# Patient Record
Sex: Female | Born: 1935 | Race: White | Hispanic: No
Health system: Southern US, Community
[De-identification: ages and names within clinical notes are randomized; demographics above are authoritative.]

## PROBLEM LIST (undated history)

## (undated) DIAGNOSIS — F329 Major depressive disorder, single episode, unspecified: Secondary | ICD-10-CM

## (undated) DIAGNOSIS — M419 Scoliosis, unspecified: Secondary | ICD-10-CM

## (undated) DIAGNOSIS — R339 Retention of urine, unspecified: Secondary | ICD-10-CM

## (undated) DIAGNOSIS — I10 Essential (primary) hypertension: Secondary | ICD-10-CM

## (undated) DIAGNOSIS — I4891 Unspecified atrial fibrillation: Secondary | ICD-10-CM

## (undated) DIAGNOSIS — F32A Depression, unspecified: Secondary | ICD-10-CM

## (undated) DIAGNOSIS — M859 Disorder of bone density and structure, unspecified: Secondary | ICD-10-CM

## (undated) DIAGNOSIS — R7301 Impaired fasting glucose: Secondary | ICD-10-CM

## (undated) DIAGNOSIS — M797 Fibromyalgia: Secondary | ICD-10-CM

## (undated) DIAGNOSIS — E079 Disorder of thyroid, unspecified: Secondary | ICD-10-CM

## (undated) DIAGNOSIS — K5900 Constipation, unspecified: Secondary | ICD-10-CM

## (undated) DIAGNOSIS — E875 Hyperkalemia: Secondary | ICD-10-CM

## (undated) DIAGNOSIS — E785 Hyperlipidemia, unspecified: Secondary | ICD-10-CM

## (undated) DIAGNOSIS — C50919 Malignant neoplasm of unspecified site of unspecified female breast: Secondary | ICD-10-CM

## (undated) DIAGNOSIS — F419 Anxiety disorder, unspecified: Secondary | ICD-10-CM

## (undated) HISTORY — PX: HIP FRACTURE SURGERY: SHX118

---

## 1998-07-05 ENCOUNTER — Encounter: Admission: RE | Admit: 1998-07-05 | Discharge: 1998-08-03 | Payer: Self-pay | Admitting: Orthopedic Surgery

## 1999-02-26 ENCOUNTER — Ambulatory Visit (HOSPITAL_COMMUNITY): Admission: RE | Admit: 1999-02-26 | Discharge: 1999-02-26 | Payer: Self-pay | Admitting: Unknown Physician Specialty

## 1999-08-04 ENCOUNTER — Encounter: Admission: RE | Admit: 1999-08-04 | Discharge: 1999-08-04 | Payer: Self-pay | Admitting: Family Medicine

## 1999-08-04 ENCOUNTER — Encounter: Payer: Self-pay | Admitting: Family Medicine

## 1999-08-21 ENCOUNTER — Encounter: Payer: Self-pay | Admitting: Neurosurgery

## 1999-08-21 ENCOUNTER — Encounter: Admission: RE | Admit: 1999-08-21 | Discharge: 1999-08-21 | Payer: Self-pay | Admitting: Neurosurgery

## 1999-08-24 ENCOUNTER — Ambulatory Visit (HOSPITAL_COMMUNITY): Admission: RE | Admit: 1999-08-24 | Discharge: 1999-08-24 | Payer: Self-pay | Admitting: Neurosurgery

## 1999-08-24 ENCOUNTER — Encounter: Payer: Self-pay | Admitting: Neurosurgery

## 1999-09-01 ENCOUNTER — Ambulatory Visit (HOSPITAL_COMMUNITY): Admission: RE | Admit: 1999-09-01 | Discharge: 1999-09-01 | Payer: Self-pay | Admitting: Gastroenterology

## 1999-09-07 ENCOUNTER — Encounter: Payer: Self-pay | Admitting: Neurosurgery

## 1999-09-07 ENCOUNTER — Ambulatory Visit (HOSPITAL_COMMUNITY): Admission: RE | Admit: 1999-09-07 | Discharge: 1999-09-07 | Payer: Self-pay | Admitting: Neurosurgery

## 1999-09-27 ENCOUNTER — Encounter: Payer: Self-pay | Admitting: Neurosurgery

## 1999-09-27 ENCOUNTER — Ambulatory Visit (HOSPITAL_COMMUNITY): Admission: RE | Admit: 1999-09-27 | Discharge: 1999-09-27 | Payer: Self-pay | Admitting: Neurosurgery

## 1999-10-11 ENCOUNTER — Encounter: Payer: Self-pay | Admitting: Neurosurgery

## 1999-10-11 ENCOUNTER — Ambulatory Visit (HOSPITAL_COMMUNITY): Admission: RE | Admit: 1999-10-11 | Discharge: 1999-10-11 | Payer: Self-pay | Admitting: Neurosurgery

## 1999-11-17 ENCOUNTER — Encounter: Admission: RE | Admit: 1999-11-17 | Discharge: 1999-11-17 | Payer: Self-pay | Admitting: Family Medicine

## 1999-11-17 ENCOUNTER — Encounter: Payer: Self-pay | Admitting: Family Medicine

## 1999-11-17 ENCOUNTER — Ambulatory Visit (HOSPITAL_COMMUNITY): Admission: RE | Admit: 1999-11-17 | Discharge: 1999-11-17 | Payer: Self-pay | Admitting: Orthopedic Surgery

## 1999-11-24 ENCOUNTER — Other Ambulatory Visit: Admission: RE | Admit: 1999-11-24 | Discharge: 1999-11-24 | Payer: Self-pay | Admitting: *Deleted

## 1999-11-24 ENCOUNTER — Encounter (INDEPENDENT_AMBULATORY_CARE_PROVIDER_SITE_OTHER): Payer: Self-pay

## 1999-12-22 ENCOUNTER — Inpatient Hospital Stay (HOSPITAL_COMMUNITY): Admission: RE | Admit: 1999-12-22 | Discharge: 1999-12-26 | Payer: Self-pay | Admitting: Orthopedic Surgery

## 1999-12-26 ENCOUNTER — Inpatient Hospital Stay (HOSPITAL_COMMUNITY)
Admission: RE | Admit: 1999-12-26 | Discharge: 2000-01-05 | Payer: Self-pay | Admitting: Physical Medicine & Rehabilitation

## 2000-01-06 ENCOUNTER — Encounter: Payer: Self-pay | Admitting: Emergency Medicine

## 2000-01-06 ENCOUNTER — Inpatient Hospital Stay (HOSPITAL_COMMUNITY): Admission: EM | Admit: 2000-01-06 | Discharge: 2000-01-11 | Payer: Self-pay | Admitting: Emergency Medicine

## 2000-01-08 ENCOUNTER — Encounter: Payer: Self-pay | Admitting: Internal Medicine

## 2000-02-08 ENCOUNTER — Encounter: Admission: RE | Admit: 2000-02-08 | Discharge: 2000-05-08 | Payer: Self-pay | Admitting: Orthopedic Surgery

## 2000-05-03 ENCOUNTER — Ambulatory Visit (HOSPITAL_COMMUNITY): Admission: RE | Admit: 2000-05-03 | Discharge: 2000-05-03 | Payer: Self-pay | Admitting: Neurosurgery

## 2000-05-03 ENCOUNTER — Encounter: Payer: Self-pay | Admitting: Neurosurgery

## 2000-05-20 ENCOUNTER — Encounter: Payer: Self-pay | Admitting: Family Medicine

## 2000-05-20 ENCOUNTER — Ambulatory Visit (HOSPITAL_COMMUNITY): Admission: RE | Admit: 2000-05-20 | Discharge: 2000-05-20 | Payer: Self-pay | Admitting: Family Medicine

## 2000-06-03 ENCOUNTER — Ambulatory Visit (HOSPITAL_COMMUNITY): Admission: RE | Admit: 2000-06-03 | Discharge: 2000-06-03 | Payer: Self-pay | Admitting: Family Medicine

## 2000-06-03 ENCOUNTER — Encounter: Payer: Self-pay | Admitting: Family Medicine

## 2000-06-17 ENCOUNTER — Emergency Department (HOSPITAL_COMMUNITY): Admission: EM | Admit: 2000-06-17 | Discharge: 2000-06-17 | Payer: Self-pay | Admitting: Emergency Medicine

## 2000-06-17 ENCOUNTER — Encounter: Payer: Self-pay | Admitting: Internal Medicine

## 2000-06-17 ENCOUNTER — Encounter: Payer: Self-pay | Admitting: Emergency Medicine

## 2000-06-17 ENCOUNTER — Encounter (INDEPENDENT_AMBULATORY_CARE_PROVIDER_SITE_OTHER): Payer: Self-pay | Admitting: Specialist

## 2000-06-17 ENCOUNTER — Inpatient Hospital Stay (HOSPITAL_COMMUNITY): Admission: EM | Admit: 2000-06-17 | Discharge: 2000-06-19 | Payer: Self-pay | Admitting: Emergency Medicine

## 2000-08-06 ENCOUNTER — Encounter: Admission: RE | Admit: 2000-08-06 | Discharge: 2000-08-06 | Payer: Self-pay | Admitting: Family Medicine

## 2000-08-06 ENCOUNTER — Encounter: Payer: Self-pay | Admitting: Family Medicine

## 2000-08-14 ENCOUNTER — Encounter: Admission: RE | Admit: 2000-08-14 | Discharge: 2000-09-17 | Payer: Self-pay | Admitting: Family Medicine

## 2000-10-09 ENCOUNTER — Observation Stay (HOSPITAL_COMMUNITY): Admission: EM | Admit: 2000-10-09 | Discharge: 2000-10-09 | Payer: Self-pay | Admitting: Emergency Medicine

## 2000-11-26 ENCOUNTER — Encounter: Admission: RE | Admit: 2000-11-26 | Discharge: 2000-11-26 | Payer: Self-pay | Admitting: Neurosurgery

## 2000-11-26 ENCOUNTER — Encounter: Payer: Self-pay | Admitting: Neurosurgery

## 2000-12-10 ENCOUNTER — Encounter: Payer: Self-pay | Admitting: Neurosurgery

## 2000-12-10 ENCOUNTER — Encounter: Admission: RE | Admit: 2000-12-10 | Discharge: 2000-12-10 | Payer: Self-pay | Admitting: Neurosurgery

## 2000-12-27 ENCOUNTER — Encounter: Admission: RE | Admit: 2000-12-27 | Discharge: 2000-12-27 | Payer: Self-pay | Admitting: Neurosurgery

## 2000-12-27 ENCOUNTER — Encounter: Payer: Self-pay | Admitting: Neurosurgery

## 2001-01-28 ENCOUNTER — Encounter: Payer: Self-pay | Admitting: Orthopedic Surgery

## 2001-01-31 ENCOUNTER — Encounter: Payer: Self-pay | Admitting: Orthopedic Surgery

## 2001-01-31 ENCOUNTER — Inpatient Hospital Stay (HOSPITAL_COMMUNITY): Admission: RE | Admit: 2001-01-31 | Discharge: 2001-02-05 | Payer: Self-pay | Admitting: Orthopedic Surgery

## 2001-02-05 ENCOUNTER — Inpatient Hospital Stay
Admission: RE | Admit: 2001-02-05 | Discharge: 2001-02-11 | Payer: Self-pay | Admitting: Physical Medicine & Rehabilitation

## 2001-03-14 ENCOUNTER — Encounter: Admission: RE | Admit: 2001-03-14 | Discharge: 2001-06-12 | Payer: Self-pay | Admitting: Orthopedic Surgery

## 2001-08-08 ENCOUNTER — Encounter: Admission: RE | Admit: 2001-08-08 | Discharge: 2001-08-08 | Payer: Self-pay | Admitting: Family Medicine

## 2001-08-08 ENCOUNTER — Encounter: Payer: Self-pay | Admitting: Family Medicine

## 2002-02-06 ENCOUNTER — Encounter: Admission: RE | Admit: 2002-02-06 | Discharge: 2002-02-06 | Payer: Self-pay | Admitting: Family Medicine

## 2002-02-06 ENCOUNTER — Encounter: Payer: Self-pay | Admitting: Family Medicine

## 2002-08-13 ENCOUNTER — Encounter: Admission: RE | Admit: 2002-08-13 | Discharge: 2002-08-13 | Payer: Self-pay | Admitting: Family Medicine

## 2002-08-13 ENCOUNTER — Encounter: Payer: Self-pay | Admitting: Family Medicine

## 2002-08-20 ENCOUNTER — Encounter: Payer: Self-pay | Admitting: Urology

## 2002-08-24 ENCOUNTER — Observation Stay (HOSPITAL_COMMUNITY): Admission: RE | Admit: 2002-08-24 | Discharge: 2002-08-25 | Payer: Self-pay | Admitting: Urology

## 2003-01-01 ENCOUNTER — Other Ambulatory Visit: Admission: RE | Admit: 2003-01-01 | Discharge: 2003-01-01 | Payer: Self-pay | Admitting: Obstetrics and Gynecology

## 2003-08-19 ENCOUNTER — Encounter: Admission: RE | Admit: 2003-08-19 | Discharge: 2003-08-19 | Payer: Self-pay | Admitting: Family Medicine

## 2003-08-25 ENCOUNTER — Encounter: Admission: RE | Admit: 2003-08-25 | Discharge: 2003-08-25 | Payer: Self-pay | Admitting: Family Medicine

## 2004-03-10 ENCOUNTER — Encounter: Admission: RE | Admit: 2004-03-10 | Discharge: 2004-03-10 | Payer: Self-pay | Admitting: Family Medicine

## 2004-03-10 ENCOUNTER — Encounter (INDEPENDENT_AMBULATORY_CARE_PROVIDER_SITE_OTHER): Payer: Self-pay | Admitting: Specialist

## 2004-08-22 ENCOUNTER — Encounter: Admission: RE | Admit: 2004-08-22 | Discharge: 2004-08-22 | Payer: Self-pay | Admitting: Family Medicine

## 2005-02-28 ENCOUNTER — Encounter: Admission: RE | Admit: 2005-02-28 | Discharge: 2005-02-28 | Payer: Self-pay | Admitting: Gastroenterology

## 2005-02-28 ENCOUNTER — Encounter (INDEPENDENT_AMBULATORY_CARE_PROVIDER_SITE_OTHER): Payer: Self-pay | Admitting: *Deleted

## 2005-02-28 ENCOUNTER — Observation Stay (HOSPITAL_COMMUNITY): Admission: EM | Admit: 2005-02-28 | Discharge: 2005-03-01 | Payer: Self-pay | Admitting: Emergency Medicine

## 2005-08-23 ENCOUNTER — Encounter: Admission: RE | Admit: 2005-08-23 | Discharge: 2005-08-23 | Payer: Self-pay | Admitting: Family Medicine

## 2006-07-30 ENCOUNTER — Encounter: Admission: RE | Admit: 2006-07-30 | Discharge: 2006-07-30 | Payer: Self-pay | Admitting: Gastroenterology

## 2006-08-12 ENCOUNTER — Encounter: Admission: RE | Admit: 2006-08-12 | Discharge: 2006-08-12 | Payer: Self-pay | Admitting: Gastroenterology

## 2006-08-28 ENCOUNTER — Encounter: Admission: RE | Admit: 2006-08-28 | Discharge: 2006-08-28 | Payer: Self-pay | Admitting: Family Medicine

## 2006-10-18 ENCOUNTER — Ambulatory Visit (HOSPITAL_COMMUNITY): Admission: RE | Admit: 2006-10-18 | Discharge: 2006-10-18 | Payer: Self-pay | Admitting: Cardiology

## 2007-01-17 ENCOUNTER — Encounter (INDEPENDENT_AMBULATORY_CARE_PROVIDER_SITE_OTHER): Payer: Self-pay | Admitting: Surgery

## 2007-01-17 ENCOUNTER — Inpatient Hospital Stay (HOSPITAL_COMMUNITY): Admission: RE | Admit: 2007-01-17 | Discharge: 2007-01-20 | Payer: Self-pay | Admitting: Surgery

## 2007-08-17 ENCOUNTER — Emergency Department (HOSPITAL_COMMUNITY): Admission: EM | Admit: 2007-08-17 | Discharge: 2007-08-17 | Payer: Self-pay | Admitting: Emergency Medicine

## 2007-09-10 ENCOUNTER — Encounter: Admission: RE | Admit: 2007-09-10 | Discharge: 2007-09-10 | Payer: Self-pay | Admitting: Obstetrics and Gynecology

## 2007-09-30 ENCOUNTER — Encounter: Admission: RE | Admit: 2007-09-30 | Discharge: 2007-09-30 | Payer: Self-pay | Admitting: Obstetrics and Gynecology

## 2007-10-10 ENCOUNTER — Encounter: Admission: RE | Admit: 2007-10-10 | Discharge: 2007-10-10 | Payer: Self-pay | Admitting: Obstetrics and Gynecology

## 2007-10-15 ENCOUNTER — Ambulatory Visit (HOSPITAL_BASED_OUTPATIENT_CLINIC_OR_DEPARTMENT_OTHER): Admission: RE | Admit: 2007-10-15 | Discharge: 2007-10-15 | Payer: Self-pay | Admitting: Obstetrics and Gynecology

## 2007-10-27 ENCOUNTER — Encounter: Admission: RE | Admit: 2007-10-27 | Discharge: 2007-10-27 | Payer: Self-pay | Admitting: Surgery

## 2007-10-27 ENCOUNTER — Ambulatory Visit (HOSPITAL_BASED_OUTPATIENT_CLINIC_OR_DEPARTMENT_OTHER): Admission: RE | Admit: 2007-10-27 | Discharge: 2007-10-27 | Payer: Self-pay | Admitting: Surgery

## 2007-11-05 ENCOUNTER — Ambulatory Visit: Payer: Self-pay | Admitting: Oncology

## 2007-11-12 ENCOUNTER — Ambulatory Visit: Admission: RE | Admit: 2007-11-12 | Discharge: 2008-01-18 | Payer: Self-pay | Admitting: Radiation Oncology

## 2007-12-09 ENCOUNTER — Ambulatory Visit: Payer: Self-pay | Admitting: Vascular Surgery

## 2007-12-09 ENCOUNTER — Encounter (INDEPENDENT_AMBULATORY_CARE_PROVIDER_SITE_OTHER): Payer: Self-pay | Admitting: Cardiology

## 2007-12-09 ENCOUNTER — Ambulatory Visit (HOSPITAL_COMMUNITY): Admission: RE | Admit: 2007-12-09 | Discharge: 2007-12-09 | Payer: Self-pay | Admitting: Cardiology

## 2007-12-12 LAB — CBC WITH DIFFERENTIAL/PLATELET
Basophils Absolute: 0 10*3/uL (ref 0.0–0.1)
EOS%: 4.2 % (ref 0.0–7.0)
Eosinophils Absolute: 0.3 10*3/uL (ref 0.0–0.5)
HGB: 13.6 g/dL (ref 11.6–15.9)
LYMPH%: 25.6 % (ref 14.0–48.0)
MCH: 29.4 pg (ref 26.0–34.0)
MCV: 87.5 fL (ref 81.0–101.0)
MONO%: 11.4 % (ref 0.0–13.0)
NEUT#: 3.9 10*3/uL (ref 1.5–6.5)
NEUT%: 58.5 % (ref 39.6–76.8)
Platelets: 215 10*3/uL (ref 145–400)
RDW: 14.2 % (ref 11.3–14.5)

## 2008-01-09 ENCOUNTER — Ambulatory Visit (HOSPITAL_COMMUNITY): Admission: RE | Admit: 2008-01-09 | Discharge: 2008-01-09 | Payer: Self-pay | Admitting: Cardiology

## 2008-01-15 ENCOUNTER — Ambulatory Visit: Payer: Self-pay | Admitting: Oncology

## 2008-01-22 ENCOUNTER — Ambulatory Visit: Admission: RE | Admit: 2008-01-22 | Discharge: 2008-01-22 | Payer: Self-pay | Admitting: Radiation Oncology

## 2008-03-18 ENCOUNTER — Ambulatory Visit: Payer: Self-pay | Admitting: Oncology

## 2008-05-25 ENCOUNTER — Ambulatory Visit: Payer: Self-pay | Admitting: Oncology

## 2008-07-06 ENCOUNTER — Encounter: Admission: RE | Admit: 2008-07-06 | Discharge: 2008-07-06 | Payer: Self-pay | Admitting: Orthopedic Surgery

## 2008-09-13 ENCOUNTER — Encounter: Admission: RE | Admit: 2008-09-13 | Discharge: 2008-09-13 | Payer: Self-pay | Admitting: Family Medicine

## 2008-11-23 ENCOUNTER — Ambulatory Visit: Payer: Self-pay | Admitting: Oncology

## 2009-01-04 ENCOUNTER — Inpatient Hospital Stay (HOSPITAL_COMMUNITY): Admission: AD | Admit: 2009-01-04 | Discharge: 2009-01-07 | Payer: Self-pay | Admitting: Cardiology

## 2009-04-30 ENCOUNTER — Emergency Department (HOSPITAL_COMMUNITY): Admission: EM | Admit: 2009-04-30 | Discharge: 2009-04-30 | Payer: Self-pay | Admitting: Emergency Medicine

## 2009-05-24 ENCOUNTER — Ambulatory Visit: Payer: Self-pay | Admitting: Oncology

## 2009-05-27 ENCOUNTER — Ambulatory Visit (HOSPITAL_COMMUNITY): Admission: RE | Admit: 2009-05-27 | Discharge: 2009-05-27 | Payer: Self-pay | Admitting: Cardiology

## 2009-08-29 ENCOUNTER — Encounter: Admission: RE | Admit: 2009-08-29 | Discharge: 2009-08-29 | Payer: Self-pay | Admitting: Orthopedic Surgery

## 2009-09-16 ENCOUNTER — Encounter: Admission: RE | Admit: 2009-09-16 | Discharge: 2009-09-16 | Payer: Self-pay | Admitting: Obstetrics and Gynecology

## 2009-10-07 ENCOUNTER — Encounter: Admission: RE | Admit: 2009-10-07 | Discharge: 2009-10-07 | Payer: Self-pay | Admitting: Family Medicine

## 2009-10-07 ENCOUNTER — Ambulatory Visit: Payer: Self-pay | Admitting: Vascular Surgery

## 2009-10-18 ENCOUNTER — Ambulatory Visit: Payer: Self-pay | Admitting: Oncology

## 2009-10-21 ENCOUNTER — Encounter: Payer: Self-pay | Admitting: Oncology

## 2009-10-21 ENCOUNTER — Ambulatory Visit: Admission: RE | Admit: 2009-10-21 | Discharge: 2009-10-21 | Payer: Self-pay | Admitting: Oncology

## 2009-10-21 ENCOUNTER — Ambulatory Visit: Payer: Self-pay | Admitting: Vascular Surgery

## 2009-11-05 ENCOUNTER — Emergency Department (HOSPITAL_COMMUNITY): Admission: EM | Admit: 2009-11-05 | Discharge: 2009-11-05 | Payer: Self-pay | Admitting: Emergency Medicine

## 2009-11-08 ENCOUNTER — Inpatient Hospital Stay (HOSPITAL_COMMUNITY): Admission: EM | Admit: 2009-11-08 | Discharge: 2009-11-15 | Payer: Self-pay | Admitting: Emergency Medicine

## 2009-11-12 ENCOUNTER — Encounter (INDEPENDENT_AMBULATORY_CARE_PROVIDER_SITE_OTHER): Payer: Self-pay | Admitting: Internal Medicine

## 2009-11-12 ENCOUNTER — Ambulatory Visit: Payer: Self-pay | Admitting: Vascular Surgery

## 2009-11-15 ENCOUNTER — Ambulatory Visit: Payer: Self-pay | Admitting: Psychiatry

## 2009-11-18 ENCOUNTER — Ambulatory Visit: Payer: Self-pay | Admitting: Oncology

## 2010-01-11 ENCOUNTER — Inpatient Hospital Stay (HOSPITAL_COMMUNITY): Admission: EM | Admit: 2010-01-11 | Discharge: 2010-01-13 | Payer: Self-pay | Admitting: Emergency Medicine

## 2010-01-20 ENCOUNTER — Encounter (HOSPITAL_BASED_OUTPATIENT_CLINIC_OR_DEPARTMENT_OTHER)
Admission: RE | Admit: 2010-01-20 | Discharge: 2010-04-14 | Payer: Self-pay | Source: Home / Self Care | Admitting: Internal Medicine

## 2010-03-17 ENCOUNTER — Ambulatory Visit (HOSPITAL_COMMUNITY)
Admission: RE | Admit: 2010-03-17 | Discharge: 2010-03-17 | Payer: Self-pay | Source: Home / Self Care | Admitting: Orthopedic Surgery

## 2010-07-27 LAB — BASIC METABOLIC PANEL
CO2: 29 mEq/L (ref 19–32)
Calcium: 8.2 mg/dL — ABNORMAL LOW (ref 8.4–10.5)
Chloride: 105 mEq/L (ref 96–112)
Chloride: 91 mEq/L — ABNORMAL LOW (ref 96–112)
Creatinine, Ser: 1 mg/dL (ref 0.4–1.2)
GFR calc Af Amer: >60 mL/min (ref >60)
GFR calc Af Amer: >60 mL/min (ref >60)
GFR calc non Af Amer: >60 mL/min (ref >60)
GFR calc non Af Amer: 54 mL/min — ABNORMAL LOW (ref >60)
Potassium: 4.2 mEq/L (ref 3.5–5.1)
Potassium: 4.5 mEq/L (ref 3.5–5.1)
Sodium: 139 mEq/L (ref 135–145)
Sodium: 140 mEq/L (ref 135–145)

## 2010-07-27 LAB — CBC
HCT: 32 % — ABNORMAL LOW (ref 36.0–46.0)
HCT: 39.2 % (ref 36.0–46.0)
Hemoglobin: 10.4 g/dL — ABNORMAL LOW (ref 12.0–15.0)
Hemoglobin: 10.7 g/dL — ABNORMAL LOW (ref 12.0–15.0)
Hemoglobin: 12.3 g/dL (ref 12.0–15.0)
MCH: 28.6 pg (ref 26.0–34.0)
MCH: 28.4 pg (ref 26.0–34.0)
MCV: 84.6 fL (ref 78.0–100.0)
RBC: 3.66 MIL/uL — ABNORMAL LOW (ref 3.87–5.11)
RBC: 3.75 MIL/uL — ABNORMAL LOW (ref 3.87–5.11)
RBC: 4.33 MIL/uL (ref 3.87–5.11)
RBC: 4.63 MIL/uL (ref 3.87–5.11)
RDW: 15.3 % (ref 11.5–15.5)
RDW: 15.1 % (ref 11.5–15.5)
WBC: 7 10*3/uL (ref 4.0–10.5)
WBC: 11.2 10*3/uL — ABNORMAL HIGH (ref 4.0–10.5)

## 2010-07-27 LAB — POCT I-STAT, CHEM 8
BUN: 44 mg/dL — ABNORMAL HIGH (ref 6–23)
Calcium, Ion: 0.99 mmol/L — ABNORMAL LOW (ref 1.12–1.32)
Chloride: 86 meq/L — ABNORMAL LOW (ref 96–112)
Creatinine, Ser: 1.3 mg/dL — ABNORMAL HIGH (ref 0.4–1.2)
Glucose, Bld: 119 mg/dL — ABNORMAL HIGH (ref 70–99)
HCT: 44 % (ref 36.0–46.0)
Hemoglobin: 15 g/dL (ref 12.0–15.0)
Potassium: 2 meq/L — CL (ref 3.5–5.1)
Sodium: 132 meq/L — ABNORMAL LOW (ref 135–145)
TCO2: 42 mmol/L (ref 0–100)

## 2010-07-27 LAB — IRON AND TIBC
Iron: 37 ug/dL — ABNORMAL LOW (ref 42–135)
Saturation Ratios: 16 % — ABNORMAL LOW (ref 20–55)
TIBC: 238 ug/dL — ABNORMAL LOW (ref 250–470)
UIBC: 201 ug/dL

## 2010-07-27 LAB — POCT CARDIAC MARKERS
CKMB, poc: <1 ng/mL — ABNORMAL LOW (ref 1.0–8.0)
Myoglobin, poc: 73.9 ng/mL (ref 12–200)
Troponin i, poc: <0.05 ng/mL (ref 0.00–0.09)

## 2010-07-27 LAB — HEPATIC FUNCTION PANEL
ALT: 21 U/L (ref 0–35)
AST: 19 U/L (ref 0–37)
Albumin: 3.7 g/dL (ref 3.5–5.2)
Alkaline Phosphatase: 55 U/L (ref 39–117)
Bilirubin, Direct: <0.1 mg/dL (ref 0.0–0.3)
Total Bilirubin: 0.3 mg/dL (ref 0.3–1.2)
Total Protein: 6.9 g/dL (ref 6.0–8.3)

## 2010-07-27 LAB — MRSA PCR SCREENING: MRSA by PCR: NEGATIVE

## 2010-07-27 LAB — DIFFERENTIAL
Eosinophils Relative: 1 % (ref 0–5)
Lymphocytes Relative: 23 % (ref 12–46)
Lymphs Abs: 2.6 10*3/uL (ref 0.7–4.0)
Monocytes Absolute: 1.1 10*3/uL — ABNORMAL HIGH (ref 0.1–1.0)

## 2010-07-27 LAB — MAGNESIUM: Magnesium: 2.1 mg/dL (ref 1.5–2.5)

## 2010-07-27 LAB — VITAMIN B12: Vitamin B-12: 507 pg/mL (ref 211–911)

## 2010-07-27 LAB — RETICULOCYTES: RBC.: 4.23 MIL/uL (ref 3.87–5.11)

## 2010-07-27 LAB — URINALYSIS, ROUTINE W REFLEX MICROSCOPIC
Nitrite: NEGATIVE
Specific Gravity, Urine: 1.012 (ref 1.005–1.030)
pH: 5.5 (ref 5.0–8.0)

## 2010-07-27 LAB — TSH: TSH: 1.085 u[IU]/mL (ref 0.350–4.500)

## 2010-07-29 LAB — APTT: aPTT: 34 seconds (ref 24–37)

## 2010-07-29 LAB — MAGNESIUM: Magnesium: 2.4 mg/dL (ref 1.5–2.5)

## 2010-07-30 LAB — APTT
aPTT: 45 seconds — ABNORMAL HIGH (ref 24–37)
aPTT: 50 seconds — ABNORMAL HIGH (ref 24–37)
aPTT: 31 seconds (ref 24–37)
aPTT: 39 seconds — ABNORMAL HIGH (ref 24–37)
aPTT: 41 seconds — ABNORMAL HIGH (ref 24–37)
aPTT: 47 seconds — ABNORMAL HIGH (ref 24–37)
aPTT: 62 seconds — ABNORMAL HIGH (ref 24–37)

## 2010-07-30 LAB — PROTIME-INR
INR: 1.21 (ref 0.00–1.49)
INR: 1.83 — ABNORMAL HIGH (ref 0.00–1.49)
INR: 1.85 — ABNORMAL HIGH (ref 0.00–1.49)
INR: 2.23 — ABNORMAL HIGH (ref 0.00–1.49)
INR: 1.58 — ABNORMAL HIGH (ref 0.00–1.49)
INR: 2.49 — ABNORMAL HIGH (ref 0.00–1.49)
INR: 3.56 — ABNORMAL HIGH (ref 0.00–1.49)
INR: 3.58 — ABNORMAL HIGH (ref 0.00–1.49)
Prothrombin Time: 19.1 seconds — ABNORMAL HIGH (ref 11.6–15.2)
Prothrombin Time: 21 seconds — ABNORMAL HIGH (ref 11.6–15.2)
Prothrombin Time: 26.7 seconds — ABNORMAL HIGH (ref 11.6–15.2)
Prothrombin Time: 35.3 seconds — ABNORMAL HIGH (ref 11.6–15.2)

## 2010-07-30 LAB — BASIC METABOLIC PANEL
BUN: 17 mg/dL (ref 6–23)
BUN: 23 mg/dL (ref 6–23)
CO2: 32 mEq/L (ref 19–32)
Calcium: 8.2 mg/dL — ABNORMAL LOW (ref 8.4–10.5)
Calcium: 8.2 mg/dL — ABNORMAL LOW (ref 8.4–10.5)
Calcium: 8.5 mg/dL (ref 8.4–10.5)
Chloride: 94 mEq/L — ABNORMAL LOW (ref 96–112)
Creatinine, Ser: 0.53 mg/dL (ref 0.4–1.2)
Creatinine, Ser: 0.69 mg/dL (ref 0.4–1.2)
Creatinine, Ser: 0.73 mg/dL (ref 0.4–1.2)
GFR calc Af Amer: >60 mL/min (ref >60)
GFR calc Af Amer: >60 mL/min (ref >60)
GFR calc non Af Amer: >60 mL/min (ref >60)
GFR calc non Af Amer: >60 mL/min (ref >60)
GFR calc non Af Amer: >60 mL/min (ref >60)
GFR calc non Af Amer: >60 mL/min (ref >60)
Glucose, Bld: 108 mg/dL — ABNORMAL HIGH (ref 70–99)
Glucose, Bld: 128 mg/dL — ABNORMAL HIGH (ref 70–99)
Potassium: 4.7 mEq/L (ref 3.5–5.1)
Potassium: 5.4 mEq/L — ABNORMAL HIGH (ref 3.5–5.1)
Potassium: 4.3 mEq/L (ref 3.5–5.1)
Potassium: 4.7 mEq/L (ref 3.5–5.1)
Sodium: 135 mEq/L (ref 135–145)
Sodium: 138 mEq/L (ref 135–145)
Sodium: 139 mEq/L (ref 135–145)

## 2010-07-30 LAB — GLUCOSE, CAPILLARY
Glucose-Capillary: 104 mg/dL — ABNORMAL HIGH (ref 70–99)
Glucose-Capillary: 119 mg/dL — ABNORMAL HIGH (ref 70–99)
Glucose-Capillary: 121 mg/dL — ABNORMAL HIGH (ref 70–99)
Glucose-Capillary: 121 mg/dL — ABNORMAL HIGH (ref 70–99)
Glucose-Capillary: 124 mg/dL — ABNORMAL HIGH (ref 70–99)
Glucose-Capillary: 128 mg/dL — ABNORMAL HIGH (ref 70–99)
Glucose-Capillary: 144 mg/dL — ABNORMAL HIGH (ref 70–99)
Glucose-Capillary: 164 mg/dL — ABNORMAL HIGH (ref 70–99)
Glucose-Capillary: 170 mg/dL — ABNORMAL HIGH (ref 70–99)

## 2010-07-30 LAB — COMPREHENSIVE METABOLIC PANEL
ALT: 19 U/L (ref 0–35)
AST: 28 U/L (ref 0–37)
AST: 20 U/L (ref 0–37)
Albumin: 2.4 g/dL — ABNORMAL LOW (ref 3.5–5.2)
Albumin: 3.3 g/dL — ABNORMAL LOW (ref 3.5–5.2)
Alkaline Phosphatase: 57 U/L (ref 39–117)
Chloride: 100 mEq/L (ref 96–112)
Chloride: 101 mEq/L (ref 96–112)
Creatinine, Ser: 0.65 mg/dL (ref 0.4–1.2)
GFR calc Af Amer: >60 mL/min (ref >60)
GFR calc Af Amer: >60 mL/min (ref >60)
Potassium: 4.9 mEq/L (ref 3.5–5.1)
Sodium: 135 mEq/L (ref 135–145)
Sodium: 139 mEq/L (ref 135–145)
Total Bilirubin: 0.9 mg/dL (ref 0.3–1.2)
Total Bilirubin: 0.7 mg/dL (ref 0.3–1.2)

## 2010-07-30 LAB — CBC
HCT: 33.9 % — ABNORMAL LOW (ref 36.0–46.0)
HCT: 40.9 % (ref 36.0–46.0)
Hemoglobin: 13.7 g/dL (ref 12.0–15.0)
Hemoglobin: 13.9 g/dL (ref 12.0–15.0)
MCH: 30 pg (ref 26.0–34.0)
MCH: 30 pg (ref 26.0–34.0)
MCHC: 33.8 g/dL (ref 30.0–36.0)
MCV: 88.4 fL (ref 78.0–100.0)
Platelets: 200 10*3/uL (ref 150–400)
Platelets: 283 10*3/uL (ref 150–400)
Platelets: 228 10*3/uL (ref 150–400)
Platelets: 243 10*3/uL (ref 150–400)
Platelets: 289 10*3/uL (ref 150–400)
RBC: 4.26 MIL/uL (ref 3.87–5.11)
RBC: 4.55 MIL/uL (ref 3.87–5.11)
RBC: 4.61 MIL/uL (ref 3.87–5.11)
RBC: 4.63 MIL/uL (ref 3.87–5.11)
RDW: 13 % (ref 11.5–15.5)
RDW: 13.8 % (ref 11.5–15.5)
WBC: 10.6 10*3/uL — ABNORMAL HIGH (ref 4.0–10.5)
WBC: 12 10*3/uL — ABNORMAL HIGH (ref 4.0–10.5)
WBC: 16 10*3/uL — ABNORMAL HIGH (ref 4.0–10.5)
WBC: 10.7 10*3/uL — ABNORMAL HIGH (ref 4.0–10.5)
WBC: 14.2 10*3/uL — ABNORMAL HIGH (ref 4.0–10.5)
WBC: 9.5 10*3/uL (ref 4.0–10.5)

## 2010-07-30 LAB — DIFFERENTIAL
Eosinophils Absolute: 0 10*3/uL (ref 0.0–0.7)
Lymphocytes Relative: 6 % — ABNORMAL LOW (ref 12–46)
Lymphs Abs: 0.6 10*3/uL — ABNORMAL LOW (ref 0.7–4.0)
Neutro Abs: 8.7 10*3/uL — ABNORMAL HIGH (ref 1.7–7.7)
Neutrophils Relative %: 92 % — ABNORMAL HIGH (ref 43–77)

## 2010-07-30 LAB — CROSSMATCH

## 2010-07-30 LAB — HEMOGLOBIN A1C
Hgb A1c MFr Bld: 6.1 % — ABNORMAL HIGH (ref <5.7)
Mean Plasma Glucose: 128 mg/dL — ABNORMAL HIGH (ref <117)

## 2010-07-30 LAB — ABO/RH: ABO/RH(D): O POS

## 2010-07-30 LAB — TSH: TSH: 0.562 u[IU]/mL (ref 0.350–4.500)

## 2010-08-14 LAB — DIFFERENTIAL
Basophils Absolute: 0 10*3/uL (ref 0.0–0.1)
Basophils Relative: 0 % (ref 0–1)
Lymphocytes Relative: 16 % (ref 12–46)
Monocytes Absolute: 0.6 10*3/uL (ref 0.1–1.0)
Monocytes Relative: 9 % (ref 3–12)
Neutro Abs: 4.8 10*3/uL (ref 1.7–7.7)
Neutrophils Relative %: 72 % (ref 43–77)

## 2010-08-14 LAB — URINALYSIS, ROUTINE W REFLEX MICROSCOPIC
Bilirubin Urine: NEGATIVE
Ketones, ur: NEGATIVE mg/dL
Nitrite: NEGATIVE
Specific Gravity, Urine: 1.016 (ref 1.005–1.030)
Urobilinogen, UA: 0.2 mg/dL (ref 0.0–1.0)

## 2010-08-14 LAB — BASIC METABOLIC PANEL
CO2: 31 mEq/L (ref 19–32)
Chloride: 104 mEq/L (ref 96–112)
Creatinine, Ser: 0.71 mg/dL (ref 0.4–1.2)
GFR calc Af Amer: >60 mL/min (ref >60)
Potassium: 4 mEq/L (ref 3.5–5.1)
Sodium: 142 mEq/L (ref 135–145)

## 2010-08-14 LAB — CBC
HCT: 36 % (ref 36.0–46.0)
Hemoglobin: 12.2 g/dL (ref 12.0–15.0)
MCHC: 33.8 g/dL (ref 30.0–36.0)
MCV: 88.1 fL (ref 78.0–100.0)
RBC: 4.08 MIL/uL (ref 3.87–5.11)
WBC: 6.7 10*3/uL (ref 4.0–10.5)

## 2010-08-14 LAB — PROTIME-INR: INR: 2.69 — ABNORMAL HIGH (ref 0.00–1.49)

## 2010-08-14 LAB — CK TOTAL AND CKMB (NOT AT ARMC): Relative Index: INVALID (ref 0.0–2.5)

## 2010-08-14 LAB — TROPONIN I: Troponin I: 0.01 ng/mL (ref 0.00–0.06)

## 2010-08-19 LAB — COMPREHENSIVE METABOLIC PANEL
ALT: 19 U/L (ref 0–35)
Alkaline Phosphatase: 61 U/L (ref 39–117)
CO2: 31 mEq/L (ref 19–32)
Chloride: 99 mEq/L (ref 96–112)
GFR calc non Af Amer: >60 mL/min (ref >60)
Glucose, Bld: 173 mg/dL — ABNORMAL HIGH (ref 70–99)
Potassium: 4 mEq/L (ref 3.5–5.1)
Sodium: 139 mEq/L (ref 135–145)
Total Bilirubin: 0.6 mg/dL (ref 0.3–1.2)

## 2010-08-19 LAB — PROTIME-INR
INR: 1.9 — ABNORMAL HIGH (ref 0.00–1.49)
INR: 2.5 — ABNORMAL HIGH (ref 0.00–1.49)
Prothrombin Time: 21.8 seconds — ABNORMAL HIGH (ref 11.6–15.2)
Prothrombin Time: 26.8 seconds — ABNORMAL HIGH (ref 11.6–15.2)

## 2010-08-19 LAB — CBC
HCT: 39.7 % (ref 36.0–46.0)
Hemoglobin: 13.3 g/dL (ref 12.0–15.0)
RBC: 4.52 MIL/uL (ref 3.87–5.11)

## 2010-09-15 ENCOUNTER — Other Ambulatory Visit: Payer: Self-pay | Admitting: Family Medicine

## 2010-09-15 DIAGNOSIS — C50919 Malignant neoplasm of unspecified site of unspecified female breast: Secondary | ICD-10-CM

## 2010-09-20 ENCOUNTER — Other Ambulatory Visit: Payer: Self-pay | Admitting: Obstetrics & Gynecology

## 2010-09-20 DIAGNOSIS — Z780 Asymptomatic menopausal state: Secondary | ICD-10-CM

## 2010-09-26 NOTE — Op Note (Signed)
NAME:  [name], [name] NO.:  [id number]   MEDICAL RECORD NO.:  [id number]          PATIENT TYPE:  INP   LOCATION:  X001                         FACILITY:  [facility]   PHYSICIAN:  Wilmon Arms. Corliss Skains, M.D. DATE OF BIRTH:  [birth year]   DATE OF PROCEDURE:  01/17/2007  DATE OF DISCHARGE:                               OPERATIVE REPORT   PREOPERATIVE DIAGNOSIS:  Large hiatal hernia.   POSTOPERATIVE DIAGNOSIS:  Large hiatal hernia.   PROCEDURE PERFORMED:  Laparoscopic hiatal hernia repair and Nissen  fundoplication.   SURGEON:  Wilmon Arms. Corliss Skains, M.D.,FACS   ASSISTANT:  Ardeth Sportsman, M.D.   ANESTHESIA:  General endotracheal.   INDICATIONS:  The patient is a 77 year old female who presented after  workup by Dr. Carman Ching.  She had evaluation for left upper quadrant  abdominal pain and reflux.  A CT scan showed a very large hiatal hernia  with most for stomach up in her chest as well as part of her transverse  colon.  This was confirmed with upper GI.  The patient had multiple  episodes of free reflux.  After cardiac clearance, she presents now for  hiatal hernia repair.   DESCRIPTION OF PROCEDURE:  The patient was brought to the operating room  and placed in the supine position on the operating room table.  After an  adequate level of general anesthesia was obtained, a Foley catheter was  placed under sterile technique.  The patient's legs were placed in  yellow fin stirrups in lithotomy position.  Her arms were tucked at her  sides.  The patient's abdomen was then prepped with Betadine and draped  in sterile fashion.  A timeout was taken to assure the proper patient  and proper procedure.  The area for the initial cannula was selected  approximately 4 cm above the umbilicus to the left of midline.  This  area was infiltrated with 0.25% Marcaine.  A transverse incision was  made.  The 10-mm Optiview port was slowly advanced into the peritoneal  cavity.   Pneumoperitoneum was obtained by insufflating CO2 and  maintaining a maximum pressure of 15 mmHg.  The patient had some  bradycardia, and the insufflation was released.  We then waited for  anesthesia to treat her bradycardia.  When she had recovered a normal  rate and rhythm, we reinsufflated with a maximum pressure at 12 mmHg.  The patient was tilted into reverse Trendelenburg, rotated slightly to  her left.  A 5-mm port was placed in the subxiphoid position.  The  trocar was then removed, and a Nathanson retractor was inserted through  the tunnel.  This was used to retract the liver superiorly to expose the  hiatus.  This was attached to the bed with the fixed retractor.  We were  able to visualize the hiatus.  A 5-mm port was placed in the right  anterior axillary line.  Two 5-mm ports were placed on either side of  the camera port as working ports.  We then reduced the stomach out of  the chest.  We opened the gastrohepatic ligament with  the harmonic  scalpel.  We exposed the a right crus of the diaphragm.  We continued  anteriorly, dissecting the hernia sac off of the anterior hiatus.  We  then continued down the left side of the crus.  We were able to expose  the junction of the left and right crura posteriorly.  Blunt dissection  was used to open the retroesophageal window from left to right.  We then  divided the short gastric vessels with the harmonic scalpel.  We opened  up the retroesophageal window wider with blunt dissection.  The stomach  was pulled around through the retroesophageal window to create our wrap.  This was done with minimal tension.  We had adequately mobilized the  stomach out of the chest, and there were no further attachments pulling  it up.  We were clearly able to identify the anterior vagus nerve.  This  was preserved.  We dissected some of the hernia sac off of the stomach  and esophagus anteriorly.  These pieces were removed and sent for  pathologic  examination.  At this point, we began repairing our crural  opening.  Three sutures of 0 Surgidac were used with pledgets to close  the hiatus posteriorly.  The sutures were tied down with the Ti-Knot  device.  The anesthesiologist was then called in to pass the bougie.  He  passed a 56-French bougie which was lighted.  We watched this enter  through the gastroesophageal junction into the stomach.  No sign of  perforation was noted.  We then created our fundoplication around the 56-  French bougie.  We used a total of 2 sutures of 0 Surgidac to create the  wrap.  We measured the wrap, and it measured 2 cm in length.  There was  plenty of intra-abdominal esophagus that could be seen.  The wrap did  not appear to be under any tension.  We placed 2 interrupted 0 Surgidac  sutures to tack the shoulders of the wrap up to the diaphragm.  We then  inspect for hemostasis.  The Nathanson retractor was removed without any  complications.  The trocars were removed as pneumoperitoneum was  released.  A 4-0 Monocryl was used to place deep sutures in all of the  port sites.  This skin incisions were all closed with Dermabond.   The patient was then extubated and brought to recovery in stable  condition.  All sponge, instrument, and needle counts were correct.      Wilmon Arms. Tsuei, M.D.  Electronically Signed     MKT/MEDQ  D:  01/17/2007  T:  01/17/2007  Job:  [id number]   cc:   Fayrene Fearing L. Malon Kindle., M.D.  Fax: [phone]   Armanda Magic, M.D.  Fax: [phone]   Donia Guiles, M.D.  Fax: [phone]

## 2010-09-26 NOTE — Op Note (Signed)
NAME:  [name], [name]              ACCOUNT NO.:  [id number]   MEDICAL RECORD NO.:  [id number]          PATIENT TYPE:  AMB   LOCATION:  DSC                          FACILITY:  [facility]   PHYSICIAN:  Sherry A. Dickstein, M.D.DATE OF BIRTH:  1931-04-09   DATE OF PROCEDURE:  DATE OF DISCHARGE:                               OPERATIVE REPORT   PREOPERATIVE DIAGNOSIS:  Postmenopausal bleeding.   POSTOPERATIVE DIAGNOSIS:  Postmenopausal bleeding.   PROCEDURE:  D and C, hysteroscopy.   SURGEON:  Sherry A. Rosalio Macadamia, M.D.   ANESTHESIA:  General.   INDICATIONS:  This is a 77 year old G3, P2, 0-1-2 woman who is  postmenopausal, had no bleeding for many years and just recently, over  the past couple of months, has been having continuous spotting.  The  patient was evaluated in the office with a normal ultrasound.  She had  an endometrial biopsy in the office which showed benign tissue.  However, there was some tubal epithelium present, which could possibly  have been part of a polyp.  The patient continued to bleed even after  the endometrial biopsy and therefore, the patient is brought to the  operating room for D and C, hysteroscopy to rule out the presence of  a  polyp.  The patient has also recently been diagnosed with breast cancer  and therefore this diagnosis must be made.   FINDINGS:  Normal-sized, anteflexed uterus.  No adnexal mass.  No  distinct polyp seen.  Some friable, polypoid tissue in the endometrial  cavity, but not a true polyp.  Blood was present in the os at time of  surgery.   PROCEDURE IN DETAIL:  The patient is brought into the operating room,  given adequate general anesthesia.  She was placed in dorsal lithotomy  position.  Her perineum and then her vagina were washed with Betadine.  Pelvic examination was performed.  Surgeon's gown and gloves were  changed.  The patient was draped in a sterile fashion.  A speculum was  placed into the vagina.  Paracervical block  was administered with 1%  Nesacaine.  The anterior lip of the cervix was grasped with a single-  toothed tenaculum.  Cervix was sounded.  Cervix was dilated with Pratt  dilators to a #23.  A diagnostic hysteroscope was introduced into the  endometrial cavity.  There was no true polyp present.  There was some  flimsy material in the endometrial cavity that was a little bit  polypoid, but could also have been present from just irritation from the  hysteroscope.  Pictures were obtained.  The hysteroscope was removed.  A  sharp curettage was performed circumferentially.  The hysteroscope was  reintroduced.  The tissue had been removed with curettage.  There was no  other abnormal tissue seen.  Adequate hemostasis was present.  All  instruments were removed from the vagina.  The patient is taken out of  dorsal lithotomy position.  She was awakened.  She was  moved from the operating table to a stretcher in stable condition.  Complications were none.  Estimated blood loss was less than 5  mL.   PATHOLOGY:  Endometrial curettings to pathology.      Sherry A. Rosalio Macadamia, M.D.  Electronically Signed     SAD/MEDQ  D:  10/15/2007  T:  10/15/2007  Job:  [id number]

## 2010-09-26 NOTE — Discharge Summary (Signed)
NAME:  [name], [name] NO.:  [id number]   MEDICAL RECORD NO.:  [id number]          PATIENT TYPE:  INP   LOCATION:  3703                         FACILITY:  [facility]   PHYSICIAN:  Jake Bathe, MD      DATE OF BIRTH:  1930/08/30   DATE OF ADMISSION:  01/04/2009  DATE OF DISCHARGE:  01/07/2009                               DISCHARGE SUMMARY   CARDIOLOGIST:  Armanda Magic, MD   PRIMARY CARE PHYSICIAN:  Donia Guiles, MD   FINAL DIAGNOSES:  1. Atrial fibrillation - paroxysmal status post cardioversion,      cardioversion was August 2009, admission for amiodarone load.  2. Hypertension.  3. Hypothyroidism.  4. History of breast cancer, status post external radiation therapy.  5. Fibromyalgia.  6. Depression.  7. Lymphocytic colitis.  8. Osteoarthritis.  9. Lymphedema.  10.Prior hematuria with negative workup.   BRIEF HOSPITAL COURSE:  A 77 year old female with a history of  paroxysmal atrial fibrillation, status post cardioversion in the recent  past who once again is in atrial fibrillation, symptomatic.  She felt  tired and short of breath and she was admitted for amiodarone loading.  She tolerated her amiodarone load well.  She remained in atrial  fibrillation throughout her hospitalization.  ECG was unremarkable and  on discharge demonstrated poor R-wave progression, atrial fibrillation,  rate of 79.  On morning of discharge, she is feeling well without any  complaints.   VITAL SIGNS:  Temperature 98.5, pulse 75, respirations 18, blood  pressure 125/81-146/77, and satting 95% on room air.  General:  Alert and oriented x3, in no acute distress, eager to go home.  CARDIOVASCULAR:  Irregularly irregular with no murmurs, rubs, or  gallops.  LUNGS:  Clear to auscultation bilaterally.  ABDOMEN:  Soft and nontender.  Normoactive bowel sounds.  No rebound or  guarding.  EXTREMITIES:  No clubbing, cyanosis, or edema.   LABORATORY WORK:  INR on day of discharge was  2.5.  Liver function  studies demonstrated an AST of 24, ALT of 19, normal.  CBC was normal  with a hemoglobin of 13.3.  TSH was normal with a value of 0.83.  Chest  x-ray on January 05, 2009, showed no active disease.  Hiatal hernia was  noted.   DISCHARGE MEDICATIONS:  1. Amiodarone taper 400 mg twice a day for 1 week, then 400 mg daily      for 1 week, then 200 mg daily thereafter.  2. Tamoxifen 20 mg a day.  3. Percocet 5/225 one tablet every 8 hours as needed.  4. Multivitamin.  5. Calcium 2 tablets a day.  6. OxyContin 20 mg every 8 hours.  7. Levothyroxine 50 mcg 1-1/2 tablets daily.  8. Lotensin 20 mg a day.  9. Frusemide 40 mg 1-1/2 tablets daily.  10.Effexor 75 mg 1 tablet a day.  11.Coumadin 2.5 mg once a day (she had been given 5 mg and now she is      therapeutic.  She will resume her dose of 2.5 mg.  This is new      start amiodarone.  She may require lower dose).   FOLLOWUP:  She has a followup appointment with Alfonse Ras, PharmD on  Monday as well as an EKG on Monday also.  She has an appointment with  Dr. Mayford Knife on January 20, 2009, at 11 a.m.  Her [facility]  appointment is on Monday at 2:15.  Her EKG is to be done at 10 a.m. on  January 10, 2009.  Prescriptions have been written.  Medications have  been reviewed.  If the patient has any significant symptoms, she knows  to contact our office immediately.   Discharge time was 35 minutes.      Jake Bathe, MD  Electronically Signed     MCS/MEDQ  D:  01/07/2009  T:  01/07/2009  Job:  [id number]

## 2010-09-26 NOTE — Op Note (Signed)
NAME:  [name], [name] NO.:  [id number]   MEDICAL RECORD NO.:  [id number]          PATIENT TYPE:  OIB   LOCATION:  2899                         FACILITY:  [facility]   PHYSICIAN:  Armanda Magic, M.D.     DATE OF BIRTH:  February 26, 1931   DATE OF PROCEDURE:  01/09/2008  DATE OF DISCHARGE:                               OPERATIVE REPORT   REFERRING PHYSICIAN:  Donia Guiles, MD   PROCEDURE:  Direct current cardioversion.   OPERATOR:  Armanda Magic, MD   COMPLICATIONS:  None.   IV MEDICATIONS:  Pentothal 250 mg IV.   INDICATIONS:  Atrial fibrillation.   This is a 77 year old female who has a history of normal LV function and  recently developed new-onset atrial fibrillation a month ago.  She has  been on Coumadin with therapeutic INRs for 4 weeks and now presents for  cardioversion.   The patient is brought to the day hospital in a fasting, nonsedated  state.  Informed consent was obtained.  The patient was connected to  continuous heart rate and pulse oximetry monitoring and blood pressure  monitor.  After adequate anesthesia was obtained, a 120 joules  synchronized biphasic shock was delivered which converted the patient to  sinus bradycardia but shortly after that she went back into atrial  fibrillation.  She was shocked again with a 150 joules synchronized  biphasic shock which successfully converted the patient to sinus  bradycardia.  The patient tolerated the procedure well with no  complications.   ASSESSMENT:  1. Atrial fibrillation.  2. Systemic anticoagulation with therapeutic INR x4 weeks.  3. Successful cardioversion to sinus bradycardia.   PLAN:  Discharged to home after fully awake.  Follow up with Dr. Mayford Knife  in 3 weeks.      Armanda Magic, M.D.  Electronically Signed    TT/MEDQ  D:  01/09/2008  T:  01/09/2008  Job:  [id number]   cc:   Donia Guiles, M.D.

## 2010-09-26 NOTE — Procedures (Signed)
DUPLEX DEEP VENOUS EXAM - UPPER EXTREMITY   INDICATION:  Swelling / right arm mass.   HISTORY:  Edema:  Yes.  Trauma/Surgery:  History of breast cancer.  Pain:  No.  PE:  No.  Previous DVT:  No.  Anticoagulants:  No.  Other:  No.   DUPLEX EXAM:                                             Bas/                IJV   SCV     AXV    BrachV  Ceph V                R  L  R   L   R  L   R   L   R  L  Thrombosis    o  o  o   o   o      o       o  Spontaneous   +  +  +   +   +      +       +  Phasic        +  +  +   +   +      +       +  Augmentation  +  +  +   +   +      +       +  Compressible  +  +  +   +   +      +       +  Competent     +  +  +   +   +      +       +  Legend:  + - yes  o - no  p - partial  D - decreased    IMPRESSION:  1. There is no evidence of deep vein thrombus noted in the right arm.  2. Spoke to Kunkle at [employer] office with preliminary report.            ___________________________________________  Larina Earthly, M.D.   CB/MEDQ  D:  10/07/2009  T:  10/07/2009  Job:  [id number]

## 2010-09-26 NOTE — Discharge Summary (Signed)
NAME:  [name], GENTLES NO.:  [id number]   MEDICAL RECORD NO.:  [id number]          PATIENT TYPE:  INP   LOCATION:  1533                         FACILITY:  [facility]   PHYSICIAN:  Wilmon Arms. Corliss Skains, M.D. DATE OF BIRTH:  1930/12/25   DATE OF ADMISSION:  01/17/2007  DATE OF DISCHARGE:  01/20/2007                               DISCHARGE SUMMARY   skip>   ADMISSION DIAGNOSIS:  Large hiatal hernia.   DISCHARGE DIAGNOSIS:  Large hiatal hernia.   PROCEDURE PERFORMED:  Laparoscopic hiatal hernia repair and Nissen  fundoplication.   The patient is a 77 year old female with multiple medical problems  including congestive heart failure, severe kyphoscoliosis and a very  large hiatal hernia with reflux.  She presents for surgical repair.  She  has previously been cleared by her cardiologist, Dr. Mayford Knife.   HOSPITAL COURSE:  The patient was admitted to the hospital on January 17, 2007.  She underwent a laparoscopic hiatal hernia repair.  Her crura  were closed with 3 interrupted pledgeted sutures.  We also performed a  Nissen fundoplication creating a 2 cm intra-abdominal fundoplication.  Postoperatively the patient has done fairly well.  A Gastrografin  swallow was performed on postop day #1.  This showed no sign of leak or  obstruction. The lumen at the fundoplication was some somewhat tight but  the fundoplication was created over a 56-French bougie.  This likely  represents a postoperative edema.  The patient has been stable and is  tolerating clear liquids.   DISCHARGE INSTRUCTIONS:  The patient should continue clear liquids for  the first week and then transition to full liquids slowly. She is given  Lortab elixir p.r.n. for pain.  She should refrain from any heavy  lifting.  She has had no nausea, but should let us know if she develops  any nausea.  She has had some diarrhea and we are ruling out C.  difficile colitis.  We will call the patient if the results are  positive.  She may shower. Follow-up with Dr. Fannie Knee in 2-3 weeks.      Wilmon Arms. Tsuei, M.D.  Electronically Signed     MKT/MEDQ  D:  01/20/2007  T:  01/20/2007  Job:  [id number]

## 2010-09-26 NOTE — Op Note (Signed)
NAME:  SYLVER, VANTASSELL NO.:  [id number]   MEDICAL RECORD NO.:  [id number]          PATIENT TYPE:  AMB   LOCATION:  DSC                          FACILITY:  [facility]   PHYSICIAN:  Wilmon Arms. Corliss Skains, M.D. DATE OF BIRTH:  May 29, 1930   DATE OF PROCEDURE:  10/27/2007  DATE OF DISCHARGE:                               OPERATIVE REPORT   PREOPERATIVE DIAGNOSIS:  Right breast cancer.   POSTOPERATIVE DIAGNOSIS:  Stage I T1 NX MX right breast cancer.   PROCEDURE PERFORMED:  Right breast needle-localized lumpectomy.   SURGEON:  Wilmon Arms. Tsuei, MD., FACS   ANESTHESIA:  General.   INDICATIONS:  The patient is a 77 year old female with significant  medical comorbidities, who presents after a screening mammogram.  On the  right breast, there was suspicious findings so the patient was brought  back for spot compression views and ultrasound.  She had a 7 x 7 x 6-mm  spiculated mass in right upper outer quadrant with some  microcalcifications.  Ultrasound-guided core biopsy on Sep 30, 2007,  showed a low-grade invasive ductal carcinoma, ER positive, PR positive,  HER-2/neu negative. The patient was referred for surgical options.  MRI  was otherwise negative.  Her case was discussed at breast conference and  the decision was made to proceed only with a lumpectomy with no need for  sentinel lymph node biopsy.   DESCRIPTION OF PROCEDURE:  The patient was brought to the operating room  and placed in supine position on the operating table.  The wire had  previously been placed [facility].  After an adequate level of general anesthesia was obtained, the  patient's right breast was prepped with Betadine and draped in sterile  fashion.  An elliptical incision was made around the wire.  Dissection  was carried down into the subcutaneous tissues with cautery.  We raised  skin flaps in all directions.  The patient's superficial breast tissue  was very  friable and completely replaced the fat.  We grasped the tissue  around the wire with an Allis clamp.  We continued following this down  inferiorly and laterally.  We continued down until we were almost to the  chest wall.  The specimen was removed.  It was then oriented with the  six-color kit.  This was then placed in the Faxitron.  This confirmed  that the clip was in the specimen.  However with discussion with the  radiologist, it was felt that the inferolateral margin might be closed.  We then took an additional inferolateral margin and oriented this with  the ink as well.  The ink represents the new margin.  These specimens  were then sent for pathologic examination.  The wound was inspected for  hemostasis.  The wound was thoroughly irrigated and closed with a deep  layer of 3-0 Vicryl and subcuticular 4-0 Monocryl.  Steri-Strips and  clean dressings were applied.  The patient was extubated and brought to  recovery room in stable condition.  All sponge, instrument, and needle  counts were correct.     Molli Hazard  Kerman Passey, M.D.  Electronically Signed    MKT/MEDQ  D:  10/27/2007  T:  10/28/2007  Job:  [id number]

## 2010-09-29 NOTE — Discharge Summary (Signed)
[place]  Patient:    [name], [name] Visit Number: [id number] MRN: [id number]          Service Type: ECR Location: SACU 4530 01 Attending Physician:  Herold Harms Dictated by:   Julien Girt, P.A. Admit Date:  02/05/2001 Discharge Date: 02/11/2001                             Discharge Summary  ADMISSION DIAGNOSES: 1. End-stage degenerative joint disease right knee. 2. Thyroid disease. 3. Hypertension. 4. Gastroesophageal reflux. 5. Depression.  DISCHARGE DIAGNOSES: 1. End-stage degenerative joint disease. 2. Urinary retention. 3. Postoperative blood loss anemia. 4. Congestive heart failure. 5. Reflux. 6. Depression. 7. Hypothyroidism.  HISTORY OF PRESENT ILLNESS:  The patient is a 77 year old female who has had end-stage degenerative joint disease of her right knee.  She has progressive worsening with pain, pain with ambulation, pain at rest.  She has tried anti-inflammatories and articular cortisone injections, all without success and is without question.  PROCEDURES IN HOUSE: On January 31, 2001 the patient underwent a right total knee replacement.  She was doing well postoperative day one.  Temperature maximum was 101.5.  Hemoglobin was 10.3.  She was metabolically stable.  PCA was discharged.  She was placed on OxyContin 30 mg b.i.d., OxyIR one to two q.3-4h. p.r.n. break through pain.  She was encouraged for incentive spirometry. Postoperative day two, pain under control.  Temperature maximum of 102.1.  Hemoglobin was 9.4. INR was 2.5.  The patient progressing with physical therapy. Postoperative day three the patient having trouble with urinary retention.  She has been catheterized times two.  She has a history of urinary retention for which she has seen Dr. Isabel Caprice in the past for.  Dr. Isabel Caprice was called.  Hemoglobin was 9.0.  INR was 3.4.  Postoperative day four, the patient doing better with urinary  retention.  Hemoglobin was 8.7.  She was given two units packed red blood cells.  She was also started on Keflex 500 mg q.i.d. x10 days for redness and warmth as well as mild drainage in her knee. Postoperative day five, the patient doing well. Knee wound well approximated. Slightly less drainage.  Still red and swollen.  Urinary retention improving. She was discharged to rehab in stable condition.  We will continue to follow her on rehab in stable condition, weight bearing as tolerated, regular diet.  DISCHARGE MEDICATIONS:  1. OxyContin 30 mg b.i.d.  2. OxyIR 5 mg two q.4-6h. p.r.n. pain.  3. Paxil 20 mg one p.o. q.d.  4. Lasix 40 mg one p.o. q.d.  5. Protonix 40 mg one p.o. q.d.  6. Lotensin 20 mg one p.o. q.d.  7. Metoprolol 5 mg one p.o. q.d.  8. Levoxyl 5 mg 1-1/2 tablets q.d.  9. Premphase q.d. 10. Citrucel q.d. Dictated by:   Julien Girt, P.A. Attending Physician:  Herold Harms DD:  03/05/01 TD:  03/07/01 Job: [id number]

## 2010-09-29 NOTE — H&P (Signed)
[place]. [facility]  Patient:    [name], [name]                       MRN: [id number] Adm. Date:  01/06/00 Attending:  Darius Bump, M.D.                         History and Physical  CHIEF COMPLAINT: The patient is a 77 year old woman admitted with decreased mental status, hypertension, and leukocytosis.  HISTORY OF PRESENT ILLNESS: [name] is a 77 year old woman who is two weeks status post left total knee replacement.  She was hospitalized on the rehabilitation service until yesterday when she was discharged home.  This morning the home health nurse found her to be somnolent and unable to hold up her head.  She was not comprehensible.  Appropriately she denied pain but was able to give very limited history.  In the emergency room she was found to have a low blood pressure with about a 90 systolic reading and had complained of chills.  She was given IV fluids, and sepsis was suspected.  She had blood culture, urinalysis, and urine culture done, and had knee aspiration done due to erythema around her incision.  She was then placed on Zosyn for empiric sepsis.  PAST MEDICAL HISTORY:  1. DJD.  2. Recent total knee replacement as above, postoperatively on Coumadin for     DVT prophylaxis.  She had low-grade temperature postoperatively and some     knee erythema and was treated briefly with Tequin.  She also had a     postoperative anemia with a hemoglobin of 10.3.  3. Urinary retention, followed by Dr. Isabel Caprice, on Flomax.  4. History of hemoptysis, although recent chart shows blood pressures about     100/60, on multiple medications prior to discharge.  5. Possible history of depression.  PAST SURGICAL HISTORY: Status post left total knee replacement.  Other surgical history not obtainable at this time.  CURRENT MEDICATIONS:  1. OxyContin continuous release 20 mg 1 p.o. b.i.d. with oxycodone IR 1-2     q.4h to q.6h p.r.n.  2. Premphase.  3. Lasix 40 mg 1/2 tablet q.d.  4. Norvasc 5 mg q.d.  5. Clonopin 0.5 mg q.h.s.  6. Diovan 160 mg b.i.d.  7. Os-Cal Plus D 2 tablets b.i.d.  8. Coumadin 1 mg q.d.  9. Flomax 0.4 mg q.h.s.  FAMILY HISTORY, SOCIAL HISTORY, REVIEW OF SYSTEMS: Currently not obtainable due to lethargic state of the patient.  PHYSICAL EXAMINATION:  GENERAL: She is lethargic and somnolent, but does become alert to verbal stimuli and then abruptly falls back to sleep.  She is intermittently tearful.  VITAL SIGNS: Blood pressure 92/68, heart rate 82, respiratory rate 20.  Pulse oximetry 98% on room air.  HEENT: PERRL.  EOMI.  Fundi appear normal though were not fully visualized.  NECK: Supple, without JVD or lymphadenopathy.  LUNGS: Clear bilaterally.  HEART: Regular rate and rhythm without murmur.  ABDOMEN: Soft, somewhat protuberant and doughy.  Normal bowel sounds. Nontender.  EXTREMITIES: Left knee is slightly erythematous.  There is distal erythema with skin changes.  Slightly warm to touch.  Right lower extremity unremarkable.  NEUROLOGIC: She is somnolent but awakens as above.  Oriented x 3 when questioned on multiple occasions.  Reflexes 3+ and symmetric in the upper extremities, 2+ in lower extremities and symmetric.  Toes downgoing. Sensation appears intact.  Strength 4+/5  and symmetric.  LABORATORY DATA: WBC 14.5, hemoglobin 11.6, platelet count 631,000; 84% polys. CK 687.  Troponin is normal.  Urinalysis negative.  CMET pending.  Tox screen is pending.  The knee was aspirated by Dr. Thurston Hole and his P.A. and this shows moderate amount of wbcs, no organism; culture pending but not thought to be the source.  Chest x-ray negative.  Head CT done on admission without contrast unremarkable for background.  LABORATORY DATA: Predischarge laboratories from January 04, 2000 showed a sodium of 131, chloride 95.  WBC 6.8, hemoglobin 10.3.  ASSESSMENT/PLAN: The patient is a 77 year old woman  with change in mental status, somnolence, and relatively low blood pressure.  I think the most likely etiology of her symptoms is narcotic use plus/minus benzodiazepines. We are unable to find out exactly how much medication she took.  Will also consider sepsis given her chills, low blood pressure, and relatively elevated WBC although there is no obvious source and she is not tachycardic.  She was dehydrated on admission and has received IV fluids in the emergency room. Also need to consider an electrolyte disturbance, especially since sodium was low prior to discharge and CMET is currently pending.  Less likely would be that she is postictal.  There are no symptoms of pulmonary embolus now and Coumadin is therapeutic.  (INR 3.8).  The plan is to admit the patient and we will place her on telemetry initially and will hydrate her gently and check vital signs frequently.  Will check CMET and tox screen as above.  I am going to hold pain medications for now but use cautiously when indicated.  Currently due to the face that Zosyn has already been started and sepsis remains a possibility we will keep her on Zosyn until blood cultures are negative, although I do not think this is the etiology.  If her mental status is not improved we could consider a neurology consultation.  I am holding antihypertensives currently due to her low blood pressure and holding Coumadin with recheck of her INR in the morning. DD:  01/06/00 TD:  01/07/00 Job: [id number]

## 2010-09-29 NOTE — Discharge Summary (Signed)
[place]. [facility]  Patient:    [name]                     MRN: [id number] Adm. Date:  [id number] Disc. Date: [id number] Attending:  Miguel Aschoff Dictator:   Dian Situ, PA CC:         Abran Cantor. [name], MD   Discharge Summary  DISCHARGE DIAGNOSES: 1. Status post right total knee replacement. 2. Hypertension. 3. Neurogenic bladder. 4. Enterococcus urinary tract infection, treated. 5. Postoperative anemia.  HISTORY OF PRESENT ILLNESS:  [name] is a 77 year old female with a history of DJD, hypertension, recently being refractory to conservative therapy.  Admitted to undergo left total knee replacement on December 22, 1999, by Dr. Thurston Hole.  Postoperatively had low-grade fevers of approximately 100 degrees, and started on Tequin for wound prophylaxis.  Weightbearing as tolerated.  On Coumadin for DVT prophylaxis.  INR remains elevated at 3.9, and epidural catheter remains in place.  To be discontinued once INR below 2.0. She has been making slow progress.  Currently she is moderate to max assist for bed mobility, moderate assistance for transfers to chair.  PAST MEDICAL HISTORY: 1. Hypertension. 2. DJD. 3. Neurogenic bladder. 4. History of depression in the past. 5. Appendectomy. 6. Left knee scope.  ALLERGIES:  No known drug allergies.  SOCIAL HISTORY:  The patient lives alone in a mobile home with five steps at entrance.  Was independent prior to admission.  She does not use any tobacco or alcohol.  HOSPITAL COURSE:  [name] was admitted to rehab on December 26, 1999, for inpatient therapies to consist of PT and OT daily.  Past admission as her INR remained elevated.  Epidural catheter remained in place until December 31, 1999.  At this time it was discontinued without any difficulty.  The patient has a history of urinary retention and voiding was monitored with PVRs checked.  She has had high volumes  and was started on some Urecholine as well as Flomax to help with emptying her bladder.  The patient was also noted to have Enterococcus UTI, and was treated with a seven day course of amoxicillin for this.  The patient refused cath despite high volumes, therefore GU consult was obtained for any further additional workup.  Dr. ______ evaluated the patient and found that the patient could be discharged on Flomax and Urecholine once a day cath, and perform workup as an outpatient basis.  The patient has been instructed regarding self-cath once a day, and has been arranged to follow up with this past discharge.  Other labs during her stay include check of lytes showing mild hyponatremia. Sodium 131, potassium 4.3, chloride 95, CO2 29, BUN 8, creatinine 0.8, glucose 110.  CBC relatively stable.  Hemoglobin 10.3, hematocrit 30.0, white count 6.8, platelets 481.  The patients blood pressures have been controlled.  She has been relatively afebrile.  The patient remains on Coumadin at time of discharge, and to continue 10 day taper.  INR at time of discharge is at 3.4. She is to hold Coumadin for a couple of days, and resume it the weekend past discharge.  During her stay at rehab, [name] progressed well to modified independent levels.  She is able to ambulate 100 to 120 feet with standard walker.  Followup therapies in terms of home health and PT have been set up through [facility].  On January 05, 2000, the patient was discharged to  home.  DISCHARGE MEDICATIONS:  1. Urecholine 50 mg t.i.d.  2. ______ 20 mg b.i.d.  3. OxyContin CR 20 mg b.i.d. x 7 days, then decrease to one per day x 7 days,     then discontinue.  4. Premphase one q.d.  5. Lasix 40 mg 1/2 q.d.  6. Norvasc 5 mg q.d.  7. Clonopin 0.5 mg q.h.s.  8. Diovan 160 mg b.i.d.  9. Os-Cal-D two b.i.d. 10. Oxycodone IR one or two p.o. q.4-6h. p.r.n. pain. 11. Coumadin one q.d. starting Monday. 12. Flomax 0.4 mg  q.h.s.  ACTIVITY:  Use walker.  DIET:  Regular.  WOUND CARE:  Keep area clean and dry.  SPECIAL INSTRUCTIONS:  No alcohol, no smoking, no driving.  No aspirin or aspirin products or NSAIDS while on Coumadin.  FOLLOWUP:  Follow up with Dr. Thurston Hole for postoperative check in 3 to 4 weeks. Follow up with Dr. ______ for an appointment in 2 to 3 weeks.  Follow up with Dr. Riley Kill as needed. DD:  02/21/00 TD:  02/23/00 Job: [id number]

## 2010-09-29 NOTE — Op Note (Signed)
[place]. [facility]  Patient:    [name], [name] Visit Number: [id number] MRN: [id number]          Service Type: SUR Location: 5000 [id number] Attending Physician:  Twana First Dictated by:   Elana Alm Thurston Hole, M.D. Proc. Date: 01/31/01 Admit Date:  01/31/2001                             Operative Report  PREOPERATIVE DIAGNOSIS:  Right knee degenerative joint disease.  POSTOPERATIVE DIAGNOSIS:  Right knee degenerative joint disease.  PROCEDURE: 1. Right total knee replacement using [employer] Scorpio total knee system with    #7 cemented femoral component, #7 cemented tibial component, 15 mm    polyethylene tibial spacer and 26 mm polyethylene cemented patella. 2. Right knee lateral retinacular release. 3. Right knee 4.5 mm screw fixation of partial tibial plateau fracture.  SURGEON:  Elana Alm. Thurston Hole, M.D.  ASSISTANT:  Julien Girt, P.A.  ANESTHESIA:  General.  OPERATIVE TIME:  Two hours.  COMPLICATIONS:  None.  DESCRIPTION OF PROCEDURE:  Ms. Zbikowski was brought to the operating room on January 31, 2001, placed on the operative table in supine position.  After an adequate level of general anesthesia was obtained, her right knee was examined under anesthesia.  Range of motion from -8 to 125 degrees, moderate varus deformity, knee stable to ligamentous exam with normal patella tracking. She had a Foley catheter placed under sterile conditions and received Ancef 1 g IV preoperatively for prophylaxis.  The right leg was prepped using sterile Betadine and draped using sterile technique.  The leg was exsanguinated and a thigh tourniquet elevated 375 mm.  Initially through a 20 cm longitudinal incision, initial exposure over the patella was made.  The underlying subcutaneous tissues were incised in line with the skin incision. A median arthrotomy was performed revealing an excessive amount of normal-appearing joint fluid.  The  articular surfaces were inspected.  She was found to have grade 4 changes medially, grade 3 changes laterally, grade 3 and 4 changes in the patellofemoral joint.  Osteophytes were removed from the femoral condyles and tibial plateau as well as around the patella.  The medial and lateral meniscal remnants were removed as well as the anterior cruciate ligament.  An intermedullary drill was then drilled up the femoral canal for placement of the distal femoral cutting jig, which was then placed in the appropriate rotation.  A distal 12 mm cut was made.  After this was done, the distal femur was sized.  A #7 was found to be the appropriate size and a #7 cutting jig was placed and then these cuts were made.  After this was done the proximal tibia was exposed.  The tibial spines were removed with an oscillating saw.  An intermedullary drill was drilled down the tibial canal for placement of the proximal tibial cutting jig, and then using a 4 mm cut based on the medial or lower side, the proximal tibial cut was made.  After this was done then the Scorpio PCL cutter was placed back on the femur and these cuts were made.  After this was done then the #7 femoral trial was hammered into position with an excellent fit.  The #7 tibial base plate with a 15 mm polyethylene spacer was placed.  This was found to give excellent stability, range of motion 0-125 degrees, with no lift-off on the tray.  The tibial tray  was then marked for rotation and the keel cut was made.  After this was done then the patella was sized.  A 26 mm was found to be the appropriate size and a recessed 10 mm x 26 mm cut was made and three locking holes were placed.  After this was done it was felt that all the trial components were of excellent size, fit, and stability.  They were then removed and the knee was then jet lavage irrigated with 3 liters of saline solution. The proximal tibia was then exposed and the #7 tibial base plate  with cement backing was hammered into position with an excellent fit, with excess cement being removed from around the edges.  It was noted, however, when this base plate was hammered into position that a crack was propagated from the anterior tibial plateau approximately 1.5 cm distally but the tibial plateau was not felt or found to be unstable, but I did decide anyway to put in a medial to lateral cannulated 4.5 mm cortical screw in order to reinforce this area just for safety purposes - even though I did not feel the fracture line was unstable.  AP and lateral fluoroscopic x-rays were used to confirm the placement of the screw but the fracture line could not be seen on fluoroscopy.  The #7 femoral component with cement backing was also hammered into position then with an excellent fit with excess cement being removed from around the edges.  The 15 mm polyethylene spacer was then locked on the tibial base plate, the knee taken through a range of motion, found to be stable with normal alignment.  The 26 mm patella button was locked into its recessed hole and held there with a clamp.  After the cement hardened, patellofemoral tracking was evaluated.  There was still some lateral tightness and thus I performed a lateral retinacular release, which significantly improved this and gave patella tracking a normal range of motion and no subluxation of the patella.  At this point it was felt that all the components were of excellent size, fit, and stability.  The knee was further irrigated with antibiotic solution.  The median arthrotomy was then closed with #1 Ethibond suture over two medium Hemovac drains, subcutaneous tissues closed with 0 and 2-0 Vicryl, skin closed with skin staples.  Sterile dressings were applied, the Hemovac injected with 0.25% Marcaine with epinephrine and clamped.  Sterile dressings and long-leg splint applied, tourniquet was release. The patient then had a femoral nerve  block placed by anesthesia for postoperative pain control.  She was then awakened and taken to recovery room in stable condition.  Needle and sponge counts correct x 2 at the end of the case.  Dictated by:   Elana Alm Thurston Hole, M.D. Attending Physician:  Twana First DD:  01/31/01 TD:  01/31/01 Job: [id number]

## 2010-09-29 NOTE — Op Note (Signed)
[place]. [facility]  Patient:    [name], [name]                     MRN: [id number] Proc. Date: 12/22/99 Adm. Date:  [id number] Attending:  Twana First CC:         ANESTHESIA DEPARTMENT   Operative Report  PROCEDURE:  Epidural catheter placement for postoperative pain relief.  INDICATIONS:  I was consulted by Dr. ______ to place an epidural catheter into Ms. Newbern for postoperative pain relief.  I elected to do the combined spinal epidural technique and had the opportunity to discuss the risks and benefits with the patient preoperatively and she agreed to the catheter placement.  Despite having some low back pain and associated hip pain problems in the past she agreed, as she did not want to undergo a general anesthetic.  I explained to her the risks and benefits of the procedure and she agreed to proceed.  DESCRIPTION OF PROCEDURE:  At the beginning of the procedure the patient was turned in the left lateral decubitus position.  The back was prepped with Betadine.  Using a #17 Tuohy needle, the epidural space was easily cannulated at the L3-L4 interspace in the midline using a loss of resistance technique. A 25 gauge spinal needle was passed through the epidural needle into the CSF and clear CSF was obtained.  A spinal anesthetic dose of Marcaine 0.75%, 1.5 cc was injected and the spinal needle was removed.  The epidural catheter was inserted 5 cm into the epidural space and the epidural needle was removed. The epidural catheter was firmly affixed to the patients back.  She was then turned spine, and with good spinal blocking underwent her total knee replacement.  At the end of the procedure she was taken to the PACU in stable condition and in the PACU a fentanyl/Marcaine infusion was begun and she will be followed on the floor by the anesthesiology service. DD:  12/22/99 TD:  12/23/99 Job: [id number]

## 2010-09-29 NOTE — Consult Note (Signed)
[place]. [facility]  Patient:    MAKAYLIN, CARLO Visit Number: [id number] MRN: [id number]          Service Type: SUR Location: 5000 5029 01 Attending Physician:  Twana First Dictated by:   Vonzell Schlatter Patsi Sears, M.D. Admit Date:  01/31/2001   CC:         Molly Maduro A. Thurston Hole, M.D.  Desma Maxim, M.D.   Consultation Report  SUBJECTIVE:  This is a 77 year old white female seen today with a history of recurrent urinary retention.  The patient was evaluated in May of 2001 for microhematuria with urinary frequency, urgency, and a weakened urinary stream. At that time, she had negative urinary cytologies and negative renal ultrasound with a 350 cc PVR.  Cystoscopy showed normal-appearing bladder with trabeculation and early diverticular formation and definite decrease in bladder sensation.  At that time, the patient voided 275 cc and had a PVR of 225 cc.  She was recommended to stop her Elavil (treatment for fibromyalgia) because of anticholinergic effect and to start Urecholine 25 mg t.i.d.  Elavil was reduced to the lowest tolerable level, but the patient continued to have 200-300 cc PVRs.  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 Self-catheterization was considered, but declined at that time.  The patient was not seen after May of 2001.  She now is status post right total knee arthroplasty and having PVRs of 700+ cc.  PAST MEDICAL HISTORY: 1. Fibromyalgia with prescription for Elavil. 2. Hypertension treated with Lasix 40 mg a day, Lotensin 20 mg a day, and    metoprolol 5 mg a day. 3. Dyspnea and atherosclerotic heart disease treated.  She has normal    myocardial wall function. 4. Anxiety and depression treated with Paxil. 5. GERD treated with Protonix 40 mg a day. 6. Hypothyroidism treated with Levoxyl 7.5 mg a day. 7. Chronic back pain treated with OxyContin. 8. Postmenopausal state treated with Premphase. 9. Chronic  atonic bladder as noted above treated with Flomax 0.4 mg per day.  ADDITIONAL CURRENT MEDICATIONS:  Coumadin postoperative right total knee arthroplasty.  SOCIAL HISTORY:  Alcohol and tobacco:  None.  FAMILY HISTORY:  Significant for atherosclerotic vascular disease and diabetes.  PHYSICAL EXAMINATION:  A well-developed white female in no acute distress. The pelvic examination today shows a grade 3 cystourethrocele.  I cannot appreciate an enterocele or rectocele.  LABORATORY DATA:  White blood cell count 10,700, hemoglobin 9.4, hematocrit 27.7.  Sodium 137, potassium 3.7, chloride 101, CO2 30, BUN 5, creatinine 0.5, glucose 143.  Urinalysis negative with a pH of 5.5 and a specific gravity of 1.014.  ASSESSMENT:  Chronic atonic bladder with associated anatomic abnormality.  She will need eventual urodynamics and exam in the office per Barron Alvine, M.D. If the cystocele is obstructing, she may be a candidate for anterior vaginal vault suspension with pubovaginal sling.  She may be a candidate for Urecholine to increase the cholinergic bladder contraction or addition of Valium 2.5 mg b.i.d. to try to relax the urinary sphincter to help her drain her urine better.  PLAN:  Follow-up per Barron Alvine, M.D. Dictated by:   Vonzell Schlatter Patsi Sears, M.D. Attending Physician:  Twana First DD:  02/03/01 TD:  02/03/01 Job: [id number]

## 2010-09-29 NOTE — Discharge Summary (Signed)
[place]. [facility]  Patient:    [name], [name] Visit Number: [id number] MRN: [id number]          Service Type: ECR Location: SACU 4530 01 Attending Physician:  Herold Harms Dictated by:   Dian Situ, PA Admit Date:  02/05/2001 Discharge Date: 02/11/2001   CC:         Desma Maxim, M.D.             Barron Alvine, M.D.             Robert A. Thurston Hole, M.D.                           Discharge Summary  DISCHARGE DIAGNOSES: 1. Status post right total knee replacement. 2. History of utonic bladder much improved with medications. 3. Hypertension. 4. History of depression. 5. Enterococcus urinary tract infection treated.  HISTORY OF PRESENT ILLNESS:  [name] is a 77 year old female with a history of DJD, hypertonic bladder, right knee pain with end-stage changes who elected to undergo right total knee replacement on September 20, by Elana Alm. Thurston Hole, M.D.  Postoperatively is weightbearing as tolerated and Coumadin for DVT prophylaxis.  INR therapeutic at time of admission.  Postoperative issues have included problems with urinary retention with PVRs in 300s.  Dr. Isabel Caprice was consulted and has recommended discharging the patient on Urecholine and Flomax for surgery and urodynamic surgeries in the future.  She has had erythema of knee with increasing temperatures, started on Keflex on September 24.  Recommendation to continue this for 10 days.  Last PT note shows the patient at minimal assist for bed mobility and transfers, guard assist to close supervision for ambulating 140 feet and flexion of 65 degrees.  PAST MEDICAL HISTORY:   Significant for hypertension, GERD, dyspnea, prolapsed bladder, GNA, left total knee replacement, constipation, and anxiety disorder as well as hypothyroidism.  ALLERGIES:  No known drug allergies.  SOCIAL HISTORY:  The patient lives alone in one-level home with five steps at entry.  Was  independent prior to admission.  She does not use any tobacco or alcohol.  She has a cousin who checks in on her occasionally.  HOSPITAL COURSE:  [name] was admitted to [facility] on February 05, 2001, for inpatient therapies to consist of PT/OT daily.  Past admission she was maintained on Coumadin for DVT prophylaxis.  She was noted to have Enterococcus UTI at admission and was continued on Keflex.  On sensitivity culture she was pansensitive, so she was maintained on Keflex for wound prophylaxis as well as UTI treatment.  PVRs were checked with bladder scans and as some high numbers were noted, her Flomax was increased to 0.8 mg q.h.s. at home.  Once the patient was able to empty bladder with improvement in her PVRs.  Her right knee incision healed well, but it did continue with some edema and erythema.  She was afebrile during her stay.  Labs at admission showed hemoglobin 11.7, hematocrit 34.6, white count 7.1, platelets 378.  Sodium 136, potassium 3.9, chloride 102, CO2 27, BUN 5, creatinine 0.5, glucose 113.  Staples remained intact and the patient was to follow up with Dr. Thurston Hole for staple removal.  Her pain control was reasonable. She was being scheduled for Oxycontin.  The patient was to continue on Keflex for two additional days to complete her antibiotic course. At the time of discharge, the patient was modified independent for  ADLs, modified independent for transfers, modified indepedent for ambulating 75 feet with rolling walker.  Further follow-up therapies to include Home Health PT/OT by [facility].  Home Health RN was arranged through [place] to draw pro times and [facility] to follow pro times through October 20. On February 11, 2001, the patient was discharged to home.  DISCHARGE MEDICATIONS:  1. Lasix 40 mg per day.  2. Paxil 20 mg per day.  3. Protonix 40 mg per day.  4. Lotensin 20 mg per day.  5. Levoxyl 75 mcg per day.  6. Flomax  0.8 mg q.h.s.  7. Urecholine 25 mg q.i.d.  8. Premphase as scheduled.  9. Coumadin 2 mg q.h.s. 10. Oxycontin CR 20 mg b.i.d. x 1 week, then decrease to one per day x 1 week,     then discontinue. 11. Lopressor 25 mg b.i.d. 12. Keflex 500 mg q.i.d. 13. Oxycodone IR 5 to 10 mg p.o. q.4-6h. p.r.n. pain.  ACTIVITY:  Use walker.  WOUND CARE:  Dry dressing in place to continue wearing stockings.  FOLLOW-UP:  The patient is to follow up with Dr. Thurston Hole for staple removal in three to four days.  Follow up with Dr. Thomasena Edis as needed. Dictated by:   Dian Situ, PA Attending Physician:  Herold Harms DD:  02/27/01 TD:  02/28/01 Job: [id number]

## 2010-09-29 NOTE — Discharge Summary (Signed)
[place]. [facility]  Patient:    [name], [name]                     MRN: [id number] Adm. Date:  [id number] Disc. Date: 01/11/00 Attending:  Miguel Aschoff CC:         Desma Maxim, M.D.             Barron Alvine, M.D.             Robert A. Thurston Hole, M.D.             Faith Rogue, M.D.                           Discharge Summary  DATE OF BIRTH:  Jun 11, 1930  CONSULTS:  Dr. Thurston Hole.  PROCEDURES:  None.  DISCHARGE DIAGNOSES:  1. Unintentional over medication with narcotics.  2. Low grade fevers, negative workup.  3. Dehydration.  4. 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  5. Over anticoagulation (postoperative deep venous thrombosis prophylaxis).  6. Depression, previously untreated.  7. Severe osteoarthritis of knees bilaterally, status post left total knee     replacement August 10.  8. History of microhematuria, followed by Dr. Isabel Caprice.  9. History of hypertension. 10. Postoperative anemia with discharge hemoglobin of 9.8, intolerance of oral     iron this admission.  DISCHARGE MEDICATIONS:  Flomax 0.4 mg p.o. q.h.s., Coumadin 0.5 mg p.o. q.d. beginning Saturday, September 1, OxyContin 10 mg p.o. b.i.d., OxyIR one p.o. q.4h. p.r.n., Premphase one p.o. q.d., Os-Cal two tablets p.o. b.i.d., Paxil 20 mg p.o. every 6 p.m., Protonix 40 mg p.o. q.d.  The patient is instructed to discontinue her Lotensin, Diovan, Lasix, Klonopin, Norvasc, and Celebrex.  DISCHARGE FOLLOWUP:  The patient is scheduled with Dr. Arvilla Market on Wednesday, September 5 at 11:30.  She is to keep her previously arranged followup appointments with Dr. Isabel Caprice and Dr. Thurston Hole.  DISPOSITION:  The patient is being discharged for a temporary stay at [facility], after which, she will return to her home after recovery functions following her recent surgery.  HISTORY OF  PRESENT ILLNESS:  The patient is a 77 year old woman who was discharged from [facility] August 24, after undergoing a left total knee replacement on August 10, followed by recuperation on the rehab units.  Upon going home, she had little recollection of events, but was found the next day by a visitor to be lethargic, and nearly unresponsive, at which point, emergency services transported her to the emergency room.  Apparently there, she could awaken enough to complain of chills and was noted to have a low blood pressure in the 80s _________.  She was administered IV fluids, and after obtaining blood, urine, and knee effusion cultures, was placed on IV Zosyn for treatment of possible sepsis.  Dr. Madison Hickman, on call physician covering for [name]. [name], M.D., admitted the patient for further evaluation.  HOSPITAL COURSE: #1 - OVER MEDICATION WITH NARCOTIC.  The patient was hypertensive and somnolent.  After initially withholding her narcotics, she awakened and safely resumed OxyContin at 10 mg p.o. b.i.d. which is her intended dose.  In retrospect, she recalled perhaps taking more than one OxyContin and remembers little less of subsequent events.  She continued to show rapidly improving mental status accompanied by normalization of blood pressure (of course  all antihypertensive medications were held).  She was soon at her baseline mental capacity and quite appropriate with _______ of underlying depression, discussed below.  As above, the patient was pan-cultured including blood, urine, knee fluid, as well as stool for C. difficile toxin, all of which were negative.  Upon negative culture results, her empiric antibiotics were discontinued.  She had one episode of a low grade fever at 100 subsequently, which as far as I can tell has been asymptomatic.  Upon her reevaluation next week, I would explore again possible underlying symptoms and check a CBC should she have  persistent fevers.  Note, WBC on admission was 14.5 and 8.3 at discharge.  #2 - DEHYDRATION.  The patient was noted to have poor skin turgor as well as hypertension on her arrival, although her hypertension was likely contributed to by over medication with narcotics.  She responded well to IV fluids which were discontinued when she was able to maintain oral hydration.  #3 - ACUTE EXACERBATION OF CHRONIC URINARY RETENTION.  The patient while on the rehab unit was noted to have significant postvoid residuals requiring on one occasion, in and out catheterization.  She was evaluated by Dr. Isabel Caprice who was aware of this chronic problem, and who recommended Flomax and monitoring of symptoms.  On admission, the patient was noted to have elevated creatinine of 2.5.  After placement of Foley catheter her creatinine to 1.3, resolving to normal at 0.7 at the time of discharge.  Her BUN was never higher than 16 and urine sodium was most consistent with an obstructive pattern.  No further difficulties with urination were noted this admission, and urine culture as above was negative.  #4 - DEPRESSION.  The patient admits to feeling very lonely and has for several years.  She has no local family.  She has psychomotor depression, poor appetite, blue feelings, and enjoys little in life.  She finally agreed to try Paxil, which will be followed up with Dr. Arvilla Market next week.  She is given a business card for a local counseling group in town, and is encouraged to contact them.  #5 - RHABDOMYOLYSIS.  Upon admission, CK was noted elevated at 687, rising to 2434 the day following admission.  This rapidly resolved and was not accompanied by a significant elevation in MB fraction.  Troponin was low at 0.03.  #6 - HISTORY OF HYPERTENSION.  As above, the patients presenting problem was hypotension and her Lotensin, Diovan, Lasix, and Norvasc were held.  She has not required resumption of these medications, and  discharge blood pressures  have remained between 120 and 140 systolic.  This will need follow up, as her progressive convalescence may result in rising values.  #7 - POSTOPERATIVE ANEMIA.  Postoperative hemoglobin has been between 9 and 10.  She has had one of three stools heme positive for which Protonix was initiated empirically.  I attempted a trial of ________ iron which was not tolerated secondary to nausea.  This could be reattempted initially to her perhaps with a better tolerated preparation. DD:  01/11/00 TD:  01/11/00 Job: [id number]

## 2010-09-29 NOTE — Discharge Summary (Signed)
[place]. [facility]  Patient:    [name], [name]                     MRN: [id number] Adm. Date:  [id number] Disc. Date: [id number] Attending:  Miguel Aschoff Dictator:   Kirstin Adelberger, P.A.                           Discharge Summary  ADMITTING DIAGNOSES: 1. End-stage degenerative joint disease, left knee. 2. Hypertension. 3. Depression.  DISCHARGE DIAGNOSES: 1. End-stage degenerative joint disease, left knee, status post    total knee replacement. 2. Hypertension. 3. Depression.  PROCEDURES:  In the hospital, on December 22, 1999, the patient underwent left total knee replacement by Dr. Thurston Hole.  HOSPITAL COURSE:  She tolerated the procedure well and was admitted postoperatively for pain control, physical therapy.  On postoperative day #1, the patient had difficulty with pain.  Her blood pressure was 85/50, hemoglobin 8.3, hematocrit 25.1, white cells 7.8, INR 1.5, sodium 135, potassium 3.6.  The surgical wound was well-approximated.  The patient was transfused on postoperative day #1.  On postoperative day #2, hemoglobin was 11.2, sodium 136, potassium 3.4, white cell count 11,000, T-max of 100.1.  The patient was started on OxyContin and Percocet.  Epidural was removed.  On postoperative day #3, T-max of 100.1, white cell count 11.6, hemoglobin 11.5, INR 3.9, potassium 3.6.  On postoperative day #4, the patient is medically stable.  The surgical wound is well-approximated, slightly red.  She was started on Tequin, still having difficulty motivating with therapy.  The patient has full extension of her knees, no drainage, no warmth, distal neurovascular exam is intact.  She was transferred to rehabilitation on postoperative day #4 in stable condition.  MEDICATIONS: 1. OxyContin 10 mg b.i.d. 2. Percocet 1-2 q.4-6h. p.r.n. breakthrough pain. 3. Norvasc 5 mg 1 p.o. q.a.m. 4. Lasix 40 mg 1 p.o. q.d. 5. Avapro 300 mg 2 tablets b.i.d. 6.  Lotensin 20 mg 1 p.o. b.i.d. 7. Coumadin per the pharmacy protocol and Colace 100 mg b.i.d.  We will continue to follow her in rehabilitation. DD:  04/02/00 TD:  04/03/00 Job: [id number]

## 2010-09-29 NOTE — Op Note (Signed)
NAME:  [name], [name] NO.:  [id number]   MEDICAL RECORD NO.:  [id number]          PATIENT TYPE:  EMS   LOCATION:  ED                           FACILITY:  [facility]   PHYSICIAN:  Lebron Conners, M.D.   DATE OF BIRTH:  [birth year]   DATE OF PROCEDURE:  02/28/2005  DATE OF DISCHARGE:                                 OPERATIVE REPORT   PREOPERATIVE DIAGNOSIS:  Cholelithiasis and acute and chronic cholecystitis.   POSTOPERATIVE DIAGNOSIS:  Cholelithiasis and acute and chronic  cholecystitis.   OPERATION:  Laparoscopic cholecystectomy with operative cholangiogram.   SURGEON:  Lebron Conners, MD.   ANESTHESIA:  General and local.   PROCEDURE:  After the patient was monitored and anesthetized and had routine  preparation and draping of the abdomen, I infiltrated local anesthetic just  below the umbilicus and then made a transverse incision about 2.5 cm in  length, incised the fascia in the midline for about 2 cm and bluntly entered  the peritoneal cavity. I found no adhesions in the region. I placed a #0  Vicryl pursestring suture in the fascia and secured a Hassan cannula and  inflated the abdomen with carbon dioxide. I saw no abnormalities except for  some adhesions in the right lower quadrant from previous appendectomy and  greatly distended a tense somewhat inflamed gallbladder with adhesions of  the duodenum and the omentum to the undersurface. I then placed an 11 mm  epigastric port and two 5 mm right mid abdominal ports through anesthetized  incisions under direct view of the laparoscope. I saw no injuries of the  viscera on placement of the ports. I then retracted the fundus of the  gallbladder toward the right shoulder and took down the adhesions with  cautery and with scissors bluntly as well. When I could see the infundibulum  of the gallbladder, I pulled it to the right and dissected the  hepatoduodenal ligament until I clearly saw the cystic duct and cystic  artery. I clipped and divided the cystic artery and then I placed a clip on  the proximal part of the cystic duct as it emerged from the infundibulum. I  made a small hole in the cystic duct and cannulated it with a mixture  cholangiogram catheter and performed a fluoroscopic cholangiogram  demonstrating slightly large ducts, but free flow into the duodenum nice  tapering distally, normal anatomy and no filling defects seen. I then  resumed laparoscopy and removed the cholangiogram catheter and clipped the  distal cystic duct with three clips and then divided it. I then dissected  the gallbladder from the liver using the cautery, gaining hemostasis in the  gallbladder fossa with the cautery. After removal of the gallbladder from  the liver, I placed it in a plastic pouch and irrigated the right upper  quadrant and removed the irrigant. There were a few small blood clots and I  carefully checked for bleeding and saw none. The clips appeared to be  secure. Since I had decompressed the gallbladder with a suction aspirator  initially, I felt we should remove it in a plastic  pouch so I  placed in a  plastic pouch and then removed it through the umbilical incision and tied  the pursestring suture. I checked once again for hemostasis and found it  good. I removed a small amount of irrigant remaining lateral to the liver  and then removed the lateral ports under  direct view and saw no bleeding from the belly wall. I allowed the carbon  dioxide to escape through the epigastric port and then removed it. I closed  the skin incisions with intracuticular 4-0 Vicryl and Steri-Strips. The  patient was stable through the operation.      Lebron Conners, M.D.  Electronically Signed     WB/MEDQ  D:  02/28/2005  T:  03/01/2005  Job:  [id number]   cc:   Donia Guiles, M.D.  Fax: [phone]

## 2010-09-29 NOTE — Consult Note (Signed)
[place]. [facility]  Patient:    Karen, Downs                     MRN: [id number] Proc. Date: 10/09/00 Adm. Date:  [id number] Attending:  Sela Hua CC:         Helene Kelp, M.D.  Desma Maxim, M.D.   Consultation Report  DATE OF BIRTH:  [birth year]  HISTORY OF PRESENT ILLNESS:  [name] is a 77 year old patient who presented via EMS early this morning after she awakened abruptly with trembling, shortness of breath, and a "gurgling" in her chest.  She experienced no chest pain.  Emergency team found her to be hyperventilating with tingling in both hands, blood pressure 140/50, pulse 62, respirations 20, oxygen saturation 98% on 2 L and 94% on room air.  At that time of my evaluation, she complains of her chronic pain, as she has not had her daily narcotic, but her breathing feels back to normal.  She recalls perhaps experiencing some shortness of breath in the days preceding this event, but thought nothing of it.  She has no clear recollection of orthopnea or exertional dyspnea and has experienced no chest pain.  She has not experienced prior illness.  Office records and the patient indicate that she has been treated for lower extremity edema, evaluated by Dr. Arbie Cookey by Doppler ultrasound with no evidence of arterial or venous insufficiency.  She had not experienced cardiopulmonary symptoms that might have directed a cardiac investigation.  She has apparently improved edema, with weight 169 pounds in March dropping to 155 pounds two weeks ago (there is no weight in the emergency department today).  Abigial has no prior history of coronary artery disease or CHF to her knowledge. She recalls a 2-D echocardiogram and stress test, remote.  PAST MEDICAL HISTORY:  Chronic pain of back and knees on chronic narcotics, history of depression and anxiety, history of osteoarthritis of the knees bilaterally status post left total knee  replacement August 2001, history of hypertension, history of postop anemia August 2001 with oral iron intolerance.  PHYSICAL EXAMINATION:  VITAL SIGNS:  On arrival, blood pressure 119/92, pulse 68, respirations 24, oxygenation 90% on room air (97% on room air after diuresis).  GENERAL:  She looks better than I have seen her on previous hospitalizations. More alert and energetic, smiling, and engaging.  She does have her chronic complaints of pain and a generally woeful attitude about life, but this is no change from baseline.  NECK:  Her neck pains are currently flat in supine position after diuresis.  LUNGS:  Clear to auscultation except for a few very distant basilar crackles at the bases.  HEART:  Regular rate and rhythm with no murmurs, rubs, or gallops.  ABDOMEN:  Soft and nontender.  EXTREMITIES:  Lower extremities are edematous below the knees, 2+ and symmetric.  She states this is improved from baseline.  The feet are warm and well perfused with symmetric distal pulses.  No areas of skin breakdown or weeping.  LABORATORY DATA:  EKG:  Normal sinus rhythm.  No ischemic changes with unchanged poor R wave progression in precordial leads.  The chest x-ray is notable for mild interstitial edema and possibly cardiomegaly, although in comparison there are different degrees of rotation which makes this difficult to assess on a one-view film.  WBC 9.2, hemoglobin 12.3, platelets 276.  Sodium 139, potassium 3.9, BUN 11, creatinine 0.6, albumin 3.0.  Total  CK 66, CK-MB 3.1, troponin 0.03.  ASSESSMENT AND PLAN:  Fluid overload congestive heart failure, mild. [name] has no pulmonary disease to suggest a primary right-sided component.  There are no murmurs to suggest valve regurgitation.  She does have a history of hypertension, which may suggest diastolic dysfunction or hypertensive heart disease.  There are no signs of ischemia.  TSH was recently checked and she has begun  Levoxyl 75 mcg q.d. for TSH of 7 obtained in March. Her serum albumin is 3, not unusual for somebody with her ______, and I do not believe low enough to direct an investigation for proteinuria (although this could be checked if her 2-D echocardiogram is unremarkable).  There is no evidence of liver disease.  She has experienced a brisk large volume diuresis of well over 1500 cc in the emergency department and is currently oxygenating 97% on room air with no chest heaviness or shortness of breath.  A 2-D echocardiogram is being performed as we speak.  Given her low acuity, absence of symptoms, and stability, we will initiate therapy to be followed up as an outpatient.  Discussed with Dr. Arvilla Market the following:  We will change Lasix from 40 mg to 60 mg p.o. q.d. and start Lotensin 20 mg q.d. to replace her Norvasc.  She will have a followup appointment in two days for weight and renal function evaluation.  I will forward copy of the 2-D echocardiogram to Dr. Arvilla Market.  Outpatient stress Cardiolite may be accomplished outpatient.  DD:  10/09/00 TD:  10/09/00 Job: [id number]

## 2010-09-29 NOTE — Op Note (Signed)
NAME:  Karen, [name]                        ACCOUNT NO.:  [id number]   MEDICAL RECORD NO.:  [id number]                   PATIENT TYPE:  AMB   LOCATION:  DAY                                  FACILITY:  [facility]   PHYSICIAN:  Valetta Fuller, M.D.               DATE OF BIRTH:  [birth year]   DATE OF PROCEDURE:  08/24/2002  DATE OF DISCHARGE:                                 OPERATIVE REPORT   PREOPERATIVE DIAGNOSIS:  Grade 4 cystocele.   POSTOPERATIVE DIAGNOSIS:  Grade 4 cystocele.   PROCEDURE PERFORMED:  1. Anterior repair  2. Obturator sling.  3. Cystoscopy.   SURGEON:  Valetta Fuller, M.D.   ASSISTANT:  Sigmund I. Patsi Sears, M.D.   ANESTHESIA:  General.   INDICATIONS:  Ms. Galvan is a 77 year old female, whom we have followed  for voiding dysfunction for some time.  She has had some component of a  hypercontractile bladder and has had elevated postvoid residuals on occasion  that have been 300 mL plus.  She also has considerable complaints of vaginal  protrusion and pressure.  She has been noted to have a grade 4 cystocele.  We have tried vaginal packing and with that, she was able to empty her  bladder more completely.  We felt that she probably had some outlet  obstruction from the cystocele.  We spoke with her at length about her  treatment options.  We felt that repair of the cystocele was certainly  justified given her complaints vaginally of the protrusion and pressure and  also given the fact that it appeared that her cystocele was causing at least  some obstruction at the level of the urethra.  We felt that a very loose  sling done concurrently would be beneficial, in that it would add some  additional support urethrally but also prevent unmasking of stress  incontinence but of course, given her hypercontractile bladder, we wanted to  be sure that was done very loosely.  Full and informed consent was obtained.   TECHNIQUE AND FINDINGS:  The patient was brought  to the operating room where  she had successful induction of general anesthesia.  She was placed in a  moderate lithotomy position and prepped and draped in the usual manner.  With full relaxation, she had a cystocele out to the introitus.  She had  mild to moderate atrophic change.  A weighted vaginal speculum was used, and  a Foley catheter was used to drain her bladder.  The anterior vaginal mucosa  was infiltrated from mid urethra all the way back to the scarred cervical  cuff.  An incision was then made in the midline.  The dissection planes were  quite normal, and it was very easy to completely dissect out her bladder all  the way down to the attenuated cardinal ligaments.  Once the entire  cystocele was exposed, it was reduced with approximately five interrupted  horizontal 2-0 Vicryl sutures and reduced very nicely.  The attenuated  cardinal ligaments were brought back together in the midline.  There was a  fair amount of redundant anterior vaginal mucosa which was trimmed  appropriately.  We then elected to do a loose obturator sling.  The OB tape  was utilized.  Two small stab incisions were made just at the medial edge of  the obturator fossa, approximately 4-4.5 cm away from the midline,  proximally to the level of the clitoris.  The special passing instruments  were then used through the obturator fossa bilaterally, and the sling was  then placed.  A right-angle clamp was passed underneath the sling, and this  was purposely left fairly loose.  The redundant sling was cut.  To add some  additional support for her cystocele, we laid in a piece of Tutoplast fascia  lata which was anchored laterally in the perivesical fascia and provided an  additional layer of support of her cystocele and also covered her sling.  The vaginal incision was then closed with a running 2-0 Vicryl suture.  Very  minimal blood loss was encountered.  Some vaginal packing with Estrace was  utilized.  The  little stab incisions in the obturator fossa were closed with  Dermabond, and a Foley catheter was left indwelling.  The patient appeared  to tolerate the procedure well, and there were no obvious complications.                                               Valetta Fuller, M.D.    DSG/MEDQ  D:  08/24/2002  T:  08/24/2002  Job:  [id number]

## 2010-09-29 NOTE — Op Note (Signed)
[place]  Patient:    [name], [name]                       MRN: [id number] Proc. Date: 12/22/99 Attending:  Elana Alm. Thurston Hole, M.D.                           Operative Report  PREOPERATIVE DIAGNOSIS: Left knee degenerative joint disease.  POSTOPERATIVE DIAGNOSIS: Left knee degenerative joint disease.  OPERATION/PROCEDURE: Left total knee replacement using [employer] Scorpio total knee system, with #7 cemented femoral component, #7 cemented tibial component, with 15 mm polyethylene tibial spacer and 26 mm polyethylene cemented patella.  SURGEON: Elana Alm. Thurston Hole, M.D.  ASSISTANT; Kirstin Adelberger, P.A.  ANESTHESIA: Spinal.  OPERATIVE TIME: One hour and 30 minutes.  COMPLICATIONS: None.  DESCRIPTION OF PROCEDURE: Ms. Baltazar was brought to the operating room on December 22, 1999 and placed on the operating room table in the supine position. She had a spinal anesthetic placed by anesthesia and a Foley catheter was placed under conditions.  She received Ancef 1 g IV preoperatively for prophylaxis.  Her left knee was examined under anesthesia, with range of motion from -5 to 125 degrees with mild varus deformity.  Stable ligamentous examination.  The left leg was prepped using sterile Betadine and draped using sterile technique.  The leg was exsanguinated and a tourniquet elevated to 350 mmHg.  Initially through a 20 cm longitudinal incision initial exposure was made.  The underlying subcutaneous tissues were incised in line with the skin incision and a median arthrotomy performed, revealing excessive amount of normal appearing joint fluid.  The articular surfaces showed complete grade 4 changes medically, grade 2 and 3 changes laterally, grade 4 changes in the patellofemoral joint.  Osteophytes were removed from the femoral condyles and around the patella and on the tibial plateau.  The medial and lateral meniscal remnants were removed along  with the anterior cruciate ligament. Intramedullary drill was used to drill out the femoral canal for placement of the distal femoral cutting jig.  This was placed in the appropriate amount of rotation and then a 10 mm recess cut was made.  The distal femur was sized.  A #7 was found to be appropriate size and the #7 cutting jig was placed and these cuts were then made.  The proximal tibia was then exposed.  The tibial spines were removed with an oscillating saw.  The intramedullary drill was used to drill down the tibial canal for placement of the proximal tibial cutting jig, which was then set in appropriate rotation and a recessed 4 mm cut was made based off the medial or lower side.  After this was done then the [facility] sacrificing cutter was placed back on the femur and these cuts were made.  After this was done then the #7 femoral trial was placed and the #7 tibial base plate with a 15 mm polyethylene spacer was placed, and found to give excellent range of motion, 0-125 degrees, with restoration of normal alignment and excellent stability.  The tibial tray was marked for rotation and then the Keel cut was made.  After this was done the patella was sized. It was found to be a 26 mm size and a recessed 10 mm x 26 mm cut was made and three locking holes placed.  After this was done it was felt that all the trial components were of excellent  size, fit, and stability.  The knee was then gently lavaged and irrigated with three liters of saline solution.  The proximal tibia was then exposed and the #7 base plate with cement backing was hammered into position, with excellent fit, and excess cement removed from around the edges.  The #7 femoral component with cement backing was hammered into position, also with excellent fit, and excess cement removed from the edges as well.  The 15 mm polyethylene spacer was locked onto the tibial base plate and the knee taken through range of motion,  0-125 degrees, with excellent stability and normal alignment.  The patella prosthesis was locked into its recessed hole and held there with a clamp, also with excess cement being removed from around the edges.  After the cement hardened patellofemoral tracking was evaluated.  There was slight amount of lateral tightness and thus lateral retinacular release was performed, improving patella tracking to normal.  After this was done it was felt that all the components were of excellent size, fit, and stability.  The knee was further irrigated with antibiotic solution and then the arthrotomy was closed with #1 Monocryl suture over two medium Hemovac drains.  The subcutaneous tissue was closed with 0 and 2-0 Vicryl and the skin closed with skin staples.  Sterile dressings were applied and a long-leg splint.  The Hemovac was injected with 0.25% Marcaine with epinephrine and clamped.  The tourniquet was released.  The patient was then taken to the recovery room in stable condition.  Needle and sponge counts were correct x 2 at the end of the case. DD:  12/22/99 TD:  12/22/99 Job: [id number]

## 2010-10-03 ENCOUNTER — Emergency Department (HOSPITAL_COMMUNITY)
Admission: EM | Admit: 2010-10-03 | Discharge: 2010-10-04 | Disposition: A | Discharge status: Home / Self Care | Payer: Medicare Other | Attending: Emergency Medicine | Admitting: Emergency Medicine

## 2010-10-03 ENCOUNTER — Emergency Department (HOSPITAL_COMMUNITY): Payer: Medicare Other

## 2010-10-03 DIAGNOSIS — I10 Essential (primary) hypertension: Secondary | ICD-10-CM | POA: Insufficient documentation

## 2010-10-03 DIAGNOSIS — I509 Heart failure, unspecified: Secondary | ICD-10-CM | POA: Insufficient documentation

## 2010-10-03 DIAGNOSIS — K449 Diaphragmatic hernia without obstruction or gangrene: Secondary | ICD-10-CM | POA: Insufficient documentation

## 2010-10-03 DIAGNOSIS — Z66 Do not resuscitate: Secondary | ICD-10-CM | POA: Insufficient documentation

## 2010-10-03 DIAGNOSIS — E039 Hypothyroidism, unspecified: Secondary | ICD-10-CM | POA: Insufficient documentation

## 2010-10-03 DIAGNOSIS — Z853 Personal history of malignant neoplasm of breast: Secondary | ICD-10-CM | POA: Insufficient documentation

## 2010-10-03 DIAGNOSIS — M79609 Pain in unspecified limb: Secondary | ICD-10-CM | POA: Insufficient documentation

## 2010-10-03 DIAGNOSIS — R079 Chest pain, unspecified: Secondary | ICD-10-CM | POA: Insufficient documentation

## 2010-10-03 LAB — BASIC METABOLIC PANEL
Calcium: 8.4 mg/dL (ref 8.4–10.5)
Creatinine, Ser: 0.62 mg/dL (ref 0.4–1.2)
GFR calc Af Amer: >60 mL/min (ref >60)
GFR calc non Af Amer: >60 mL/min (ref >60)
Glucose, Bld: 105 mg/dL — ABNORMAL HIGH (ref 70–99)
Sodium: 135 mEq/L (ref 135–145)

## 2010-10-03 LAB — CBC
HCT: 35.3 % — ABNORMAL LOW (ref 36.0–46.0)
Hemoglobin: 11.2 g/dL — ABNORMAL LOW (ref 12.0–15.0)
RDW: 15.2 % (ref 11.5–15.5)
WBC: 7.7 10*3/uL (ref 4.0–10.5)

## 2010-10-03 LAB — DIFFERENTIAL
Basophils Absolute: 0 10*3/uL (ref 0.0–0.1)
Basophils Relative: 1 % (ref 0–1)
Lymphocytes Relative: 26 % (ref 12–46)
Neutro Abs: 4.3 10*3/uL (ref 1.7–7.7)
Neutrophils Relative %: 57 % (ref 43–77)

## 2010-10-03 LAB — POCT CARDIAC MARKERS
CKMB, poc: <1 ng/mL — ABNORMAL LOW (ref 1.0–8.0)
Myoglobin, poc: 50.4 ng/mL (ref 12–200)

## 2010-10-04 LAB — POCT CARDIAC MARKERS: CKMB, poc: <1 ng/mL — ABNORMAL LOW (ref 1.0–8.0)

## 2010-10-10 ENCOUNTER — Ambulatory Visit
Admission: RE | Admit: 2010-10-10 | Discharge: 2010-10-10 | Disposition: A | Discharge status: Home / Self Care | Payer: Medicare Other | Source: Ambulatory Visit | Attending: Family Medicine | Admitting: Family Medicine

## 2010-10-10 DIAGNOSIS — C50919 Malignant neoplasm of unspecified site of unspecified female breast: Secondary | ICD-10-CM

## 2010-10-12 ENCOUNTER — Ambulatory Visit
Admission: RE | Admit: 2010-10-12 | Discharge: 2010-10-12 | Disposition: A | Discharge status: Home / Self Care | Payer: Medicare Other | Source: Ambulatory Visit | Attending: Obstetrics & Gynecology | Admitting: Obstetrics & Gynecology

## 2010-10-12 DIAGNOSIS — Z780 Asymptomatic menopausal state: Secondary | ICD-10-CM

## 2011-02-07 LAB — CBC
MCHC: 33.9
MCV: 85.5
Platelets: 197
RBC: 4.7
RDW: 13.6

## 2011-02-07 LAB — COMPREHENSIVE METABOLIC PANEL
ALT: 23
AST: 23
Albumin: 3.8
CO2: 35 — ABNORMAL HIGH
Calcium: 9.3
Creatinine, Ser: 0.81
GFR calc Af Amer: >60
GFR calc non Af Amer: >60
Sodium: 138
Total Protein: 6.3

## 2011-02-08 LAB — BASIC METABOLIC PANEL
BUN: 18
CO2: 33 — ABNORMAL HIGH
Chloride: 100
Creatinine, Ser: 0.76
Potassium: 4.7

## 2011-02-08 LAB — CBC
HCT: 41.8
MCHC: 33.7
MCV: 86.2
Platelets: 222
WBC: 9.2

## 2011-02-08 LAB — DIFFERENTIAL
Basophils Relative: 0
Eosinophils Absolute: 0.5
Eosinophils Relative: 6 — ABNORMAL HIGH
Lymphs Abs: 2.3
Monocytes Relative: 10
Neutrophils Relative %: 59

## 2011-02-23 LAB — CBC
HCT: 41.5
Hemoglobin: 12.4
Hemoglobin: 13.8
MCHC: 33.2
MCHC: 33.5
RBC: 4.82
RDW: 14.2 — ABNORMAL HIGH
RDW: 14.2 — ABNORMAL HIGH

## 2011-02-23 LAB — COMPREHENSIVE METABOLIC PANEL
ALT: 16
Alkaline Phosphatase: 72
BUN: 14
CO2: 33 — ABNORMAL HIGH
Calcium: 9.4
GFR calc non Af Amer: >60
Glucose, Bld: 101 — ABNORMAL HIGH
Total Protein: 6.5

## 2011-02-23 LAB — DIFFERENTIAL
Basophils Relative: 0
Eosinophils Absolute: 0.1
Lymphs Abs: 1.8
Monocytes Relative: 9
Neutro Abs: 3.7
Neutrophils Relative %: 60

## 2011-02-23 LAB — APTT: aPTT: 29

## 2011-02-23 LAB — CLOSTRIDIUM DIFFICILE EIA: C difficile Toxins A+B, EIA: NEGATIVE

## 2011-02-23 LAB — PROTIME-INR
INR: 1.1
Prothrombin Time: 13.9

## 2011-03-07 ENCOUNTER — Inpatient Hospital Stay (HOSPITAL_COMMUNITY)
Admission: EM | Admit: 2011-03-07 | Discharge: 2011-03-12 | DRG: 690 | Disposition: A | Discharge status: Home Health | Payer: Medicare Other | Source: Ambulatory Visit | Attending: Internal Medicine | Admitting: Internal Medicine

## 2011-03-07 ENCOUNTER — Emergency Department (HOSPITAL_COMMUNITY): Payer: Medicare Other

## 2011-03-07 DIAGNOSIS — Z853 Personal history of malignant neoplasm of breast: Secondary | ICD-10-CM

## 2011-03-07 DIAGNOSIS — I4891 Unspecified atrial fibrillation: Secondary | ICD-10-CM | POA: Diagnosis present

## 2011-03-07 DIAGNOSIS — Z79899 Other long term (current) drug therapy: Secondary | ICD-10-CM

## 2011-03-07 DIAGNOSIS — Z96659 Presence of unspecified artificial knee joint: Secondary | ICD-10-CM

## 2011-03-07 DIAGNOSIS — E039 Hypothyroidism, unspecified: Secondary | ICD-10-CM | POA: Diagnosis present

## 2011-03-07 DIAGNOSIS — F329 Major depressive disorder, single episode, unspecified: Secondary | ICD-10-CM | POA: Diagnosis present

## 2011-03-07 DIAGNOSIS — G894 Chronic pain syndrome: Secondary | ICD-10-CM | POA: Diagnosis present

## 2011-03-07 DIAGNOSIS — Z66 Do not resuscitate: Secondary | ICD-10-CM | POA: Diagnosis present

## 2011-03-07 DIAGNOSIS — N10 Acute tubulo-interstitial nephritis: Principal | ICD-10-CM | POA: Diagnosis present

## 2011-03-07 DIAGNOSIS — Z7901 Long term (current) use of anticoagulants: Secondary | ICD-10-CM

## 2011-03-07 DIAGNOSIS — M19079 Primary osteoarthritis, unspecified ankle and foot: Secondary | ICD-10-CM | POA: Diagnosis present

## 2011-03-07 DIAGNOSIS — E876 Hypokalemia: Secondary | ICD-10-CM | POA: Diagnosis present

## 2011-03-07 DIAGNOSIS — E860 Dehydration: Secondary | ICD-10-CM | POA: Diagnosis present

## 2011-03-07 DIAGNOSIS — Z882 Allergy status to sulfonamides status: Secondary | ICD-10-CM

## 2011-03-07 DIAGNOSIS — I872 Venous insufficiency (chronic) (peripheral): Secondary | ICD-10-CM | POA: Diagnosis present

## 2011-03-07 DIAGNOSIS — I10 Essential (primary) hypertension: Secondary | ICD-10-CM | POA: Diagnosis present

## 2011-03-07 DIAGNOSIS — F3289 Other specified depressive episodes: Secondary | ICD-10-CM | POA: Diagnosis present

## 2011-03-08 LAB — DIFFERENTIAL
Basophils Absolute: 0 10*3/uL (ref 0.0–0.1)
Basophils Relative: 0 % (ref 0–1)
Eosinophils Absolute: 0.2 10*3/uL (ref 0.0–0.7)
Eosinophils Relative: 2 % (ref 0–5)
Lymphocytes Relative: 23 % (ref 12–46)
Lymphs Abs: 1.6 10*3/uL (ref 0.7–4.0)
Monocytes Absolute: 1.1 10*3/uL — ABNORMAL HIGH (ref 0.1–1.0)
Monocytes Relative: 10 % (ref 3–12)
Monocytes Relative: 12 % (ref 3–12)
Neutrophils Relative %: 65 % (ref 43–77)

## 2011-03-08 LAB — MAGNESIUM
Magnesium: 2 mg/dL (ref 1.5–2.5)
Magnesium: 2.2 mg/dL (ref 1.5–2.5)

## 2011-03-08 LAB — URINALYSIS, ROUTINE W REFLEX MICROSCOPIC
Bilirubin Urine: NEGATIVE
Nitrite: NEGATIVE
Protein, ur: NEGATIVE mg/dL
Urobilinogen, UA: 1 mg/dL (ref 0.0–1.0)

## 2011-03-08 LAB — COMPREHENSIVE METABOLIC PANEL
AST: 15 U/L (ref 0–37)
Albumin: 3.2 g/dL — ABNORMAL LOW (ref 3.5–5.2)
BUN: 21 mg/dL (ref 6–23)
BUN: 24 mg/dL — ABNORMAL HIGH (ref 6–23)
CO2: 31 mEq/L (ref 19–32)
Calcium: 8.7 mg/dL (ref 8.4–10.5)
Chloride: 102 mEq/L (ref 96–112)
Chloride: 94 mEq/L — ABNORMAL LOW (ref 96–112)
Creatinine, Ser: 0.71 mg/dL (ref 0.50–1.10)
Creatinine, Ser: 0.74 mg/dL (ref 0.50–1.10)
GFR calc Af Amer: >90 mL/min (ref >90)
GFR calc non Af Amer: 80 mL/min — ABNORMAL LOW (ref >90)
Glucose, Bld: 168 mg/dL — ABNORMAL HIGH (ref 70–99)
Total Bilirubin: 0.1 mg/dL — ABNORMAL LOW (ref 0.3–1.2)
Total Bilirubin: 0.2 mg/dL — ABNORMAL LOW (ref 0.3–1.2)

## 2011-03-08 LAB — CBC
HCT: 32.8 % — ABNORMAL LOW (ref 36.0–46.0)
MCH: 24.9 pg — ABNORMAL LOW (ref 26.0–34.0)
MCHC: 31.5 g/dL (ref 30.0–36.0)
MCV: 79.4 fL (ref 78.0–100.0)
Platelets: 239 10*3/uL (ref 150–400)
RBC: 4.13 MIL/uL (ref 3.87–5.11)
RDW: 17 % — ABNORMAL HIGH (ref 11.5–15.5)
WBC: 6.9 10*3/uL (ref 4.0–10.5)

## 2011-03-08 LAB — POCT I-STAT TROPONIN I: Troponin i, poc: 0 ng/mL (ref 0.00–0.08)

## 2011-03-08 LAB — PROTIME-INR
INR: 1.38 (ref 0.00–1.49)
Prothrombin Time: 17.2 seconds — ABNORMAL HIGH (ref 11.6–15.2)

## 2011-03-08 LAB — TSH: TSH: 1.018 u[IU]/mL (ref 0.350–4.500)

## 2011-03-08 LAB — URINE CULTURE

## 2011-03-08 LAB — MRSA PCR SCREENING: MRSA by PCR: NEGATIVE

## 2011-03-08 LAB — URINE MICROSCOPIC-ADD ON

## 2011-03-08 NOTE — H&P (Signed)
NAME:  [name], [name] NO.:  [id number]  MEDICAL RECORD NO.:  [id number]  LOCATION:  [facility]                         FACILITY:  [facility]  PHYSICIAN:  Talmage Nap, MD  DATE OF BIRTH:  06/27/1930  DATE OF ADMISSION:  03/07/2011 DATE OF DISCHARGE:                             HISTORY & PHYSICAL   PRIMARY CARE PHYSICIAN:  Lupita Raider, MD, Family Medicine.  History obtainable from the patient.  CHIEF COMPLAINT:  Left flank pain as well as left ankle pain of unspecified duration.  The patient is a 77 year old Caucasian female with history of breast CA; chronic venous stasis, on Lasix, presenting to the emergency room with pain in the left ankle as well as in the left flank of unspecified duration.  This, however, got worse today prior to presenting to the emergency room.  The pain in the left ankle is described as sharp with tingling sensation in the feet with occasional difficulty in ambulation. The patient, however, denied any history of trauma.  She denied any history of fall.  At the same time, the patient complained about pain in the left flank that was radiating to the back with associated suprapubic discomfort with dysuria.  She denied any fever.  She denied any chills. She denied any rigor.  She denied any chest pain or shortness of breath. Symptoms were said to be getting worse, hence the patient presented to the hospital to be evaluated.  PAST MEDICAL HISTORY:  Positive for: 1. Breast CA. 2. Chronic venous stasis. 3. Depression. 4. Hypertension. 5. Hypothyroidism. 6. Questionable atrial fibrillation.  PAST SURGICAL HISTORY: 1. Breast CA, status post mastectomy. 2. Cholecystectomy. 3. Bilateral knee replacement. 4. Right hip fracture, status post open reduction internal fixation.  Preadmission meds with dosages, but no frequency include: 1. MiraLax 17 g. 2. Oxycodone 15 mg. 3. Levothyroxine 50 mcg. 4. Lasix 40 mg. 5. Potassium chloride 20  mEq. 6. Cordarone 200 mg.  ALLERGIES:  SULFA (SULFONAMIDE).  SOCIAL HISTORY:  Negative for alcohol, tobacco use.  The patient lives at the [facility].  FAMILY HISTORY:  Negative for any coronary artery disease or heart failure.  REVIEW OF SYSTEMS:  The patient denies any history of headaches.  No blurred vision.  Complained about excessive tears with dryness of the mouth.  She denied any chest pain.  She denied any shortness of breath. No cough.  Complained about pain in the left flank as well as in the suprapubic region with associated dysuria.  Denies any hematuria. Denies any hematochezia, melena.  Has swelling of the lower extremity, worse on the right than on the left.  No intolerance to heat or cold and no neuropsychiatric disorder.  PHYSICAL EXAMINATION:  GENERAL:  Elderly lady, moderately dehydrated, not in any obvious respiratory distress. VITAL SIGNS:  At present, blood pressure is 109/67, pulse is 58, respiratory rate is 14, temperature is 98.6. HEENT:  Pallor but pupils are reactive to light and extraocular muscles are intact. NECK:  No jugular venous distention.  No carotid bruit.  No lymphadenopathy. CHEST:  Clear to auscultation. HEART:  Sounds are 1 and 2. ABDOMEN:  Soft with tenderness in the left flank as well as  in the suprapubic region.  No guarding.  No rigidity.  Liver, spleen, and kidney not palpable.  Bowel sounds are positive. EXTREMITIES:  Pedal edema, worse on the right than on the left. NEUROPSYCHIATRIC:  Unremarkable. SKIN:  Decreased turgor. MUSCULOSKELETAL SYSTEM:  Arthritic changes, particularly in the ankle joint.  LABORATORY DATA:  Hematological indices showed WBC of 9.1, hemoglobin of 10.8, hematocrit of 34.3, MCV of 78.0 with a platelet count of 238, neutrophils 57 and absolute granulocyte count is 5.2.  First set of cardiac marker, troponin-I 0.00.  Chemistry showed sodium of 137, potassium of 2.9, chloride of 94  with a bicarb of 33, glucose is 101, BUN is 24, creatinine 0.74.  Coagulation profile showed PT 17.2, INR 1.38.  Magnesium level is 2.0.  Urinalysis showed large leukocyte esterase with negative nitrite.  Urine microscopy showed urine wbc's too numerous to count, urine rbc's 3-6 with many bacteria.  Imaging studies done include x-ray of the lumbar spine, which showed no definite evidence of acute fracture subluxation, has right-sided effusion involving the vertebral body of T12-L2.  Chest x-ray showed mild cardiomegaly, otherwise unremarkable.  X-ray of the left ankle showed no evidence of fracture or dislocation.  There is plantar calcaneal spur seen with small focus of calcification along the plantar aponeurosis.  IMPRESSION: 1. Left pyelonephritis. 2. Hypokalemia. 3. Dehydration. 4. Chronic venous stasis. 5. Hypertension. 6. Hypothyroidism. 7. History of breast cancer. 8. Questionable atrial fibrillation.  PLAN:  Admit the patient to general medical floor.  The patient will be rehydrated with normal saline with 20 mEq of KCl to go at rate of 85 mL an hour, followed by KCl 10 mEq p.o. b.i.d.  She will be treated for UTI with Cipro 400 mg IV q.12 h.  Pain in the left flank will be controlled with Dilaudid 0.5 mg IV q.4 h. p.r.n.  She will be restarted on Synthroid 50 mcg p.o. daily for hypothyroidism.  For questionable atrial fibrillation, the patient will be on Cordarone 200 mg p.o. daily.  GI prophylaxis will be done with Protonix 40 mg IV q.24 h. and DVT prophylaxis with Lovenox 40 mg subcu q.24 h.  Further workup to be done on this patient will include urine culture, CBC, CMP and magnesium will be repeated in the a.m.  The patient will also have a thyroid panel done, which will include TSH, T3, and T4.  She will be followed and evaluated on day-to-day basis.     Talmage Nap, MD     CN/MEDQ  D:  03/08/2011  T:  03/08/2011  Job:  [id number]  Electronically Signed  by Talmage Nap  on 03/08/2011 06:43:40 PM

## 2011-03-09 DIAGNOSIS — M7989 Other specified soft tissue disorders: Secondary | ICD-10-CM

## 2011-03-09 DIAGNOSIS — M79609 Pain in unspecified limb: Secondary | ICD-10-CM

## 2011-03-09 LAB — BASIC METABOLIC PANEL
BUN: 10 mg/dL (ref 6–23)
Chloride: 104 mEq/L (ref 96–112)
GFR calc non Af Amer: 86 mL/min — ABNORMAL LOW (ref >90)
Glucose, Bld: 102 mg/dL — ABNORMAL HIGH (ref 70–99)
Potassium: 3.6 mEq/L (ref 3.5–5.1)

## 2011-03-09 LAB — CBC
HCT: 34.7 % — ABNORMAL LOW (ref 36.0–46.0)
Hemoglobin: 11 g/dL — ABNORMAL LOW (ref 12.0–15.0)
MCHC: 31.7 g/dL (ref 30.0–36.0)

## 2011-03-10 LAB — CBC
HCT: 36.7 % (ref 36.0–46.0)
MCH: 25.2 pg — ABNORMAL LOW (ref 26.0–34.0)
MCHC: 31.6 g/dL (ref 30.0–36.0)
MCV: 79.6 fL (ref 78.0–100.0)
RDW: 17.2 % — ABNORMAL HIGH (ref 11.5–15.5)

## 2011-03-10 LAB — BASIC METABOLIC PANEL
BUN: 8 mg/dL (ref 6–23)
CO2: 30 mEq/L (ref 19–32)
Chloride: 102 mEq/L (ref 96–112)
Creatinine, Ser: 0.63 mg/dL (ref 0.50–1.10)

## 2011-03-11 LAB — BASIC METABOLIC PANEL
BUN: 12 mg/dL (ref 6–23)
Creatinine, Ser: 0.73 mg/dL (ref 0.50–1.10)
GFR calc Af Amer: >90 mL/min (ref >90)
GFR calc non Af Amer: 80 mL/min — ABNORMAL LOW (ref >90)

## 2011-03-12 NOTE — Discharge Summary (Signed)
NAME:  [name], CLINE NO.:  [id number]  MEDICAL RECORD NO.:  [id number]  LOCATION:  5118                         FACILITY:  [facility]  PHYSICIAN:  Isidor Holts, M.D.  DATE OF BIRTH:  Aug 07, 1930  DATE OF ADMISSION:  03/07/2011 DATE OF DISCHARGE:  03/12/2011                        DISCHARGE SUMMARY - REFERRING   PRIMARY MD:  Lupita Raider, M.D.  DISCHARGE DIAGNOSES: 1. Acute left pyelonephritis. 2. Mild dehydration. 3. Left ankle pain, secondary to osteoarthritis. 4. History of right breast cancer, status post mastectomy. 5. Hypertension. 6. Depression. 7. Hypothyroidism. 8. History of atrial fibrillation, status post direct current     cardioversion in the past. 9. Chronic anticoagulation. 10.History of bilateral knee replacements. 11.History of left hip hemiarthroplasty for fracture. 12.Status post cholecystectomy. 13.Status post laparoscopic hiatal hernia repair.  DISCHARGE MEDICATIONS: 1. Amlodipine 10 mg p.o. daily. 2. Ciprofloxacin 500 mg p.o. b.i.d. for 2 days only. 3. Oxycodone SR 15 mg p.o. every 12 hours for pain. 4. Oxycodone 5 mg p.o. q.4 hourly for pain (the patient was on 15 mg     scheduled q.4 hourly). 5. Acetaminophen 325 mg p.o. p.r.n. q.4 hourly for headache, pain, and     fever. 6. Amiodarone 200 mg p.o. daily. 7. Benazepril/hydrochlorothiazide (20/12.5 mg) one tablet p.o. daily. 8. Baclofen 10 mg p.o. t.i.d. 9. Ciclopiroxolamine 0.77% cream one application topically b.i.d. to     affected areas. 10.Coumadin per INR, currently on 2.5 mg at 6:00 p.m. daily on Monday     through Friday. 11.Diazepam 5 mg p.o. p.r.n. q.6 hourly for anxiety. 12.Gralise 300 mg ER 2 tablets p.o. daily at supper time. 13.Klor-Con 20 mEq p.o. daily. 14.Loperamide 2 mg p.o. p.r.n. for diarrhea with up to 8 times per     day. 15.Levothyroxine 50 mcg p.o. daily. 16.Multivitamin one tablet p.o. daily. 17.MiraLAX 17 g p.o. p.r.n. daily for  constipation. 18.__________1% powder one application topically t.i.d. p.r.n. to     affected areas of the skin. 19.Tamoxifen 10 mg p.o. daily. 20.Venlafaxine 150 mg capsule XR one p.o. daily.  Note: Furosemide has been discontinued, secondary to dehydration.  PROCEDURES: 1. X-ray lumbar spine March 08, 2011.  This showed no definite     evidence for acute fracture-subluxation, suboptimal lateral views     due to patient's extensive left convex lumbar scoliosis, right-     sided effusion involving vertebral bodies T12-L2 and osseous fusion     at L3-L4. 2. Chest x-ray March 08, 2011.  This showed mild cardiomegaly.     Lungs remain grossly clear. 3. Left ankle x-ray March 08, 2011.  This showed no evidence of     fracture dislocation.  Plantar calcaneal spur is seen with small     focus of calcification along the plantar aponeurosis. 4. Bilateral lower extremity venous Doppler March 09, 2011.  This     showed no evidence of deep venous superficial thrombosis involving     bilateral lower extremities.  CONSULTATIONS:  None.  ADMISSION HISTORY:  As per H and P notes of March 07, 2011 dictated by Dr. Talmage Downs.  However, in brief, this is a 77 year old female, with known history of right breast cancer status post mastectomy,  hypertension, depression, hypothyroidism, status post bilateral knee replacements, status post left hip hemiarthroplasty for fracture, atrial fibrillation, status post direct current cardioversion in the past, status post cholecystectomy, status post laparoscopic hiatal hernia repair, presenting with left flank pain as well as left ankle pain of unspecified duration, but worsening in character.  She was admitted for further evaluation, investigation, and management.  CLINICAL COURSE: 1. Acute left pyelonephritis.  The patient presented as described     above.  On detailed questioning, she admits to left flank pain     radiating to the back,  with associated suprapubic discomfort and     dysuria.  Urinalysis demonstrated positive urinary sediment with     rbc's 3-6, wbc's too numerous to count, many bacteria.  She was     commenced on intravenous ciprofloxacin and during the course of her     hospitalization, her symptoms resolved.  She had no episodes of     pyrexia and urine culture subsequently showed no growth.  As of     March 11, 2011, she was on day #5 of planned 7-day course of     ciprofloxacin, to be concluded on March 13, 2011.  She has been     transitioned to oral ciprofloxacin, effective March 12, 2011.  2. Mild dehydration.  The patient presented with a BUN of 24,     creatinine 0.74, against background of Lasix and hydrochlorothiazide     therapy.  Diuretics were discontinued.  She was managed with     intravenous fluid hydration.  We are pleased to note that as of     March 11, 2011, BUN was 12, creatinine 0.73, i.e. dehydration had     resolved.  Intravenous fluids were discontinued accordingly.  3. Left ankle pain.  We suspect that this is secondary to     osteoarthritis.  The patient does have a known history of DJD and     imaging studies including x-rays, as well as bilateral lower     extremity venous Dopplers failed to show acute pathology.  She was     managed with analgesic medication, ambulated by physiotherapist and     as of March 11, 2011, she was asymptomatic from this viewpoint.  4. History of right breast cancer.  The patient has no evidence of     disease recurrence.  She continues on tamoxifen.  5. Hypertension.  This was controlled with preadmission     antihypertensive medications.  Norvasc however, has been added to     the patient's antihypertensive regimen with good control.  6. History of depression.  The patient's mood remained stable on     preadmission antidepressant medication.  7. Hypothyroidism.  The patient's TSH was normal at 1.018.  She has     been continued  on preadmission dose of Synthroid.  8. History of atrial fibrillation.  The patient is status post direct     current cardioversion in the past.  She remained in sinus rhythm     during the course of this hospitalization.  9. Chronic anticoagulation.  The patient is to be continued on     preadmission anticoagulation regimen, on discharge.  DISPOSITION:  The patient as of March 11, 2011, was asymptomatic. There were no new issues.  She was very keen to be discharged.  Provided no acute problems arise in the interim, she will be discharged on March 12, 2011.  Of note, the patient appears to have chronic pain syndrome  and was preadmission, on 15 mg of OxyIR on a scheduled basis q.4 hourly.  We have substituted this with OxyContin, without any deleterious effects.  The patient will be discharged on March 12, 2011.  ACTIVITY:  As tolerated.  Recommended to increase activity slowly.  DIET:  Heart healthy.  FOLLOW-UP INSTRUCTIONS:  The patient to follow up routinely with her primary MD, Dr. Lupita Raider, who will continue to manage the patient's anticoagulation as preadmission.     Isidor Holts, M.D.     CO/MEDQ  D:  03/11/2011  T:  03/11/2011  Job:  [id number]  cc:   Lupita Raider, M.D.  Electronically Signed by Isidor Holts M.D. on 03/12/2011 06:37:46 PM

## 2011-04-25 ENCOUNTER — Emergency Department (HOSPITAL_COMMUNITY): Payer: Medicare Other

## 2011-04-25 ENCOUNTER — Encounter: Payer: Self-pay | Admitting: Emergency Medicine

## 2011-04-25 ENCOUNTER — Emergency Department (HOSPITAL_COMMUNITY)
Admission: EM | Admit: 2011-04-25 | Discharge: 2011-04-25 | Disposition: A | Discharge status: Skilled Nursing Facility | Payer: Medicare Other | Attending: Emergency Medicine | Admitting: Emergency Medicine

## 2011-04-25 DIAGNOSIS — I10 Essential (primary) hypertension: Secondary | ICD-10-CM | POA: Insufficient documentation

## 2011-04-25 DIAGNOSIS — Z853 Personal history of malignant neoplasm of breast: Secondary | ICD-10-CM | POA: Insufficient documentation

## 2011-04-25 DIAGNOSIS — E079 Disorder of thyroid, unspecified: Secondary | ICD-10-CM | POA: Insufficient documentation

## 2011-04-25 DIAGNOSIS — Z7901 Long term (current) use of anticoagulants: Secondary | ICD-10-CM | POA: Insufficient documentation

## 2011-04-25 DIAGNOSIS — F329 Major depressive disorder, single episode, unspecified: Secondary | ICD-10-CM | POA: Insufficient documentation

## 2011-04-25 DIAGNOSIS — M25552 Pain in left hip: Secondary | ICD-10-CM

## 2011-04-25 DIAGNOSIS — I951 Orthostatic hypotension: Secondary | ICD-10-CM

## 2011-04-25 DIAGNOSIS — Z96649 Presence of unspecified artificial hip joint: Secondary | ICD-10-CM | POA: Insufficient documentation

## 2011-04-25 DIAGNOSIS — M4120 Other idiopathic scoliosis, site unspecified: Secondary | ICD-10-CM | POA: Insufficient documentation

## 2011-04-25 DIAGNOSIS — F3289 Other specified depressive episodes: Secondary | ICD-10-CM | POA: Insufficient documentation

## 2011-04-25 DIAGNOSIS — R51 Headache: Secondary | ICD-10-CM | POA: Insufficient documentation

## 2011-04-25 DIAGNOSIS — W19XXXA Unspecified fall, initial encounter: Secondary | ICD-10-CM

## 2011-04-25 DIAGNOSIS — W1809XA Striking against other object with subsequent fall, initial encounter: Secondary | ICD-10-CM | POA: Insufficient documentation

## 2011-04-25 DIAGNOSIS — M25559 Pain in unspecified hip: Secondary | ICD-10-CM | POA: Insufficient documentation

## 2011-04-25 HISTORY — DX: Depression, unspecified: F32.A

## 2011-04-25 HISTORY — DX: Major depressive disorder, single episode, unspecified: F32.9

## 2011-04-25 HISTORY — DX: Scoliosis, unspecified: M41.9

## 2011-04-25 HISTORY — DX: Essential (primary) hypertension: I10

## 2011-04-25 HISTORY — DX: Disorder of thyroid, unspecified: E07.9

## 2011-04-25 LAB — POCT I-STAT, CHEM 8
BUN: 33 mg/dL — ABNORMAL HIGH (ref 6–23)
BUN: <3 mg/dL — ABNORMAL LOW (ref 6–23)
Creatinine, Ser: 2 mg/dL — ABNORMAL HIGH (ref 0.50–1.10)
Creatinine, Ser: 0.6 mg/dL (ref 0.50–1.10)
Glucose, Bld: 105 mg/dL — ABNORMAL HIGH (ref 70–99)
Hemoglobin: 16 g/dL — ABNORMAL HIGH (ref 12.0–15.0)
Potassium: 2.8 mEq/L — ABNORMAL LOW (ref 3.5–5.1)
Potassium: 3.4 mEq/L — ABNORMAL LOW (ref 3.5–5.1)
Sodium: 137 mEq/L (ref 135–145)
Sodium: 143 mEq/L (ref 135–145)
TCO2: 31 mmol/L (ref 0–100)

## 2011-04-25 LAB — CBC
Hemoglobin: 13.9 g/dL (ref 12.0–15.0)
MCV: 78.5 fL (ref 78.0–100.0)
Platelets: 291 10*3/uL (ref 150–400)
RBC: 5.4 MIL/uL — ABNORMAL HIGH (ref 3.87–5.11)
WBC: 10.7 10*3/uL — ABNORMAL HIGH (ref 4.0–10.5)

## 2011-04-25 LAB — DIFFERENTIAL
Lymphocytes Relative: 25 % (ref 12–46)
Lymphs Abs: 2.7 10*3/uL (ref 0.7–4.0)
Monocytes Relative: 12 % (ref 3–12)
Neutrophils Relative %: 61 % (ref 43–77)

## 2011-04-25 MED ORDER — POTASSIUM CHLORIDE 10 MEQ/100ML IV SOLN: 10.0000 meq | Freq: Once | INTRAVENOUS | Status: DC

## 2011-04-25 MED ORDER — SODIUM CHLORIDE 0.9 % IV BOLUS (SEPSIS)
1000.0000 mL | Freq: Once | INTRAVENOUS | Status: AC
  Administered 2011-04-25: 1000 mL via INTRAVENOUS

## 2011-04-25 MED ORDER — POTASSIUM CHLORIDE 20 MEQ/15ML (10%) PO LIQD
20.0000 meq | Freq: Once | ORAL | Status: AC
  Administered 2011-04-25: 20 meq via ORAL
  Filled 2011-04-25: qty 15

## 2011-04-25 NOTE — ED Notes (Signed)
Transported by Sharin Mons to [facility]- report called to [place], med tech

## 2011-04-25 NOTE — ED Notes (Signed)
When assessed by nurse pt denied pain, when pharmacy went in to talk to pt, pt discussed that she is in pain and at the nursing home is scheduled around the clock with pain medications. At this time no pain medication ordered.

## 2011-04-25 NOTE — ED Notes (Signed)
Larey Seat out of bed this am. No LOC. Complaints of hip pain and head pain. No bruising noted. A&Ox4.

## 2011-04-25 NOTE — ED Provider Notes (Signed)
History    this is a 77 year old female, on Coumadin, presenting to the ED with a recent fall. Patient states she was moving from her chair to the bed when she slipped, fall, and landed on her left side. She denies passing out but does admit to hitting the left forehead as well as her left hip. She does complain of left hip pain but denies significant headache, neck pain, shoulder elbow or leg pain.  CSN: [id number] Arrival date & time: 04/25/2011 11:44 AM   First MD Initiated Contact with Patient 04/25/11 1202      Chief Complaint  Patient presents with  . Hip Pain  . Headache    (Consider location/radiation/quality/duration/timing/severity/associated sxs/prior treatment) HPI  Past Medical History  Diagnosis Date  . Scoliosis   . Depression   . Breast cancer   . Hypertension   . Thyroid disease     Past Surgical History  Procedure Date  . Hip fracture surgery     No family history on file.  History  Substance Use Topics  . Smoking status: Never Smoker   . Smokeless tobacco: Not on file  . Alcohol Use: No    OB History    Grav Para Term Preterm Abortions TAB SAB Ect Mult Living                  Review of Systems  All other systems reviewed and are negative.    Allergies  Sulfa antibiotics  Home Medications  No current outpatient prescriptions on file.  BP 87/54  Pulse 56  Temp(Src) 97.8 F (36.6 C) (Oral)  SpO2 98%  Physical Exam  Nursing note and vitals reviewed. Constitutional: She is oriented to person, place, and time. She appears well-developed and well-nourished. No distress.       Awake, alert, nontoxic appearance  HENT:  Head: Normocephalic and atraumatic.  Right Ear: External ear normal.  Left Ear: External ear normal.  Mouth/Throat: Oropharynx is clear and moist. No oropharyngeal exudate.  Eyes: Conjunctivae and EOM are normal. Pupils are equal, round, and reactive to light. Right eye exhibits no discharge. Left eye exhibits no  discharge.  Neck: Normal range of motion. Neck supple.  Cardiovascular: Normal rate and regular rhythm.   Pulmonary/Chest: Effort normal and breath sounds normal. No respiratory distress. She exhibits no tenderness.  Abdominal: Soft. Bowel sounds are normal. There is no tenderness. There is no rebound.  Musculoskeletal: Normal range of motion. She exhibits no edema.       Left hip: She exhibits tenderness. She exhibits normal range of motion, no bony tenderness and no deformity.       Baseline ROM, no obvious new focal weakness  Lymphadenopathy:    She has no cervical adenopathy.  Neurological: She is alert and oriented to person, place, and time. She has normal strength. No cranial nerve deficit or sensory deficit. Coordination and gait normal. GCS eye subscore is 4. GCS verbal subscore is 5. GCS motor subscore is 6.       Mental status and motor strength appears baseline for patient and situation  Skin: No rash noted.  Psychiatric: Her speech is normal. Her affect is angry.    ED Course  Procedures (including critical care time)  Labs Reviewed - No data to display No results found.   No diagnosis found.    MDM  Pt has a mechanical fall.  She is on coumadin, and subtherapeutic.  Her initial BP is in the 80s systolic, but does not  appears dehydrated.  She does have positive orthostatic VS.  IV fluid given. Attempt to obtain hemocult to r/o bleeding was unsuccessful as pt refuse.  Pt does not wants to be admitted.  She is alert and oriented x 3.  Initially her Sharen Hones was obtained but result was not appropriate (K 2.8, BUN 33, Cr 2.0) however, after recheck she has normal K+ and normal renal function.    Pt continue to refuse hospital stay.  I have spoken with her son, who is her power of attorney.  Per son, his mom is on a steady mental decline.  However, she is still competent to make decisions.  With that being said, my attending will evaluate pt.     4:31 PM Pt's BP is 130 systolic  with just 1 L of fluid.  She is able to tolerates PO.  She request to be discharge.  She is in no acute distress and does not look dehydrated.  Her repeat lab is unremarkable.  Therefore, i will discharge pt.     6:00 PM Her orthostatic pressure improves with IV fluid. She is able to stand without difficulty. She normally walks using a walker. She will be discharged.  Fayrene Helper, [place] 04/25/11 [phone]

## 2011-04-25 NOTE — ED Notes (Signed)
[facility] called for transport.  

## 2011-04-25 NOTE — ED Notes (Signed)
ZOX:[id number]<AV> Expected date:04/25/11<BR> Expected time:11:27 AM<BR> Means of arrival:Ambulance<BR> Comments:<BR> fall

## 2011-04-26 NOTE — ED Provider Notes (Signed)
Medical screening examination/treatment/procedure(s) were conducted as a shared visit with non-physician practitioner(s) and myself.  I personally evaluated the patient during the encounter.   Neuro exam nonfocal. Fall sounds mechanical. Hypotension noted on arrival +orthostatic. Appears mildly dehydrated. Labs initially abnl but lab error- repeated and unremarkable. Pt adamantly refusing admission. MMSE unremarkable and per family pt competent. Repeat orthostatics after 2L IVF with plans to reassess.   Forbes Cellar, MD 04/26/11 [phone]

## 2011-08-01 DIAGNOSIS — Z0000 Encounter for general adult medical examination without abnormal findings: Secondary | ICD-10-CM | POA: Diagnosis not present

## 2011-08-01 DIAGNOSIS — Z1211 Encounter for screening for malignant neoplasm of colon: Secondary | ICD-10-CM | POA: Diagnosis not present

## 2011-08-20 DIAGNOSIS — L608 Other nail disorders: Secondary | ICD-10-CM | POA: Diagnosis not present

## 2011-08-20 DIAGNOSIS — M79609 Pain in unspecified limb: Secondary | ICD-10-CM | POA: Diagnosis not present

## 2011-08-20 DIAGNOSIS — I739 Peripheral vascular disease, unspecified: Secondary | ICD-10-CM | POA: Diagnosis not present

## 2011-09-13 ENCOUNTER — Other Ambulatory Visit: Payer: Self-pay | Admitting: Dermatology

## 2011-09-28 ENCOUNTER — Other Ambulatory Visit: Payer: Self-pay | Admitting: Obstetrics & Gynecology

## 2011-09-28 DIAGNOSIS — Z853 Personal history of malignant neoplasm of breast: Secondary | ICD-10-CM

## 2011-11-07 ENCOUNTER — Ambulatory Visit
Admission: RE | Admit: 2011-11-07 | Discharge: 2011-11-07 | Disposition: A | Discharge status: Home / Self Care | Payer: Medicare Other | Source: Ambulatory Visit | Attending: Obstetrics & Gynecology | Admitting: Obstetrics & Gynecology

## 2011-11-07 DIAGNOSIS — Z853 Personal history of malignant neoplasm of breast: Secondary | ICD-10-CM

## 2011-11-08 DIAGNOSIS — L608 Other nail disorders: Secondary | ICD-10-CM | POA: Diagnosis not present

## 2011-11-08 DIAGNOSIS — I739 Peripheral vascular disease, unspecified: Secondary | ICD-10-CM | POA: Diagnosis not present

## 2011-11-08 DIAGNOSIS — L84 Corns and callosities: Secondary | ICD-10-CM | POA: Diagnosis not present

## 2012-01-31 DIAGNOSIS — L608 Other nail disorders: Secondary | ICD-10-CM | POA: Diagnosis not present

## 2012-01-31 DIAGNOSIS — I739 Peripheral vascular disease, unspecified: Secondary | ICD-10-CM | POA: Diagnosis not present

## 2012-01-31 DIAGNOSIS — L84 Corns and callosities: Secondary | ICD-10-CM | POA: Diagnosis not present

## 2012-03-03 ENCOUNTER — Other Ambulatory Visit: Payer: Self-pay | Admitting: Family Medicine

## 2012-03-03 ENCOUNTER — Ambulatory Visit
Admission: RE | Admit: 2012-03-03 | Discharge: 2012-03-03 | Disposition: A | Discharge status: Home / Self Care | Payer: Medicare Other | Source: Ambulatory Visit | Attending: Family Medicine | Admitting: Family Medicine

## 2012-03-03 DIAGNOSIS — R51 Headache: Secondary | ICD-10-CM

## 2012-04-14 DIAGNOSIS — M545 Low back pain: Secondary | ICD-10-CM | POA: Diagnosis not present

## 2012-04-14 DIAGNOSIS — R262 Difficulty in walking, not elsewhere classified: Secondary | ICD-10-CM | POA: Diagnosis not present

## 2012-04-14 DIAGNOSIS — E039 Hypothyroidism, unspecified: Secondary | ICD-10-CM | POA: Diagnosis not present

## 2012-04-14 DIAGNOSIS — S72009B Fracture of unspecified part of neck of unspecified femur, initial encounter for open fracture type I or II: Secondary | ICD-10-CM | POA: Diagnosis not present

## 2012-04-14 DIAGNOSIS — E119 Type 2 diabetes mellitus without complications: Secondary | ICD-10-CM | POA: Diagnosis not present

## 2012-04-14 DIAGNOSIS — R279 Unspecified lack of coordination: Secondary | ICD-10-CM | POA: Diagnosis not present

## 2012-04-15 DIAGNOSIS — S72009B Fracture of unspecified part of neck of unspecified femur, initial encounter for open fracture type I or II: Secondary | ICD-10-CM | POA: Diagnosis not present

## 2012-04-15 DIAGNOSIS — M545 Low back pain: Secondary | ICD-10-CM | POA: Diagnosis not present

## 2012-04-15 DIAGNOSIS — R279 Unspecified lack of coordination: Secondary | ICD-10-CM | POA: Diagnosis not present

## 2012-04-15 DIAGNOSIS — E119 Type 2 diabetes mellitus without complications: Secondary | ICD-10-CM | POA: Diagnosis not present

## 2012-04-15 DIAGNOSIS — E039 Hypothyroidism, unspecified: Secondary | ICD-10-CM | POA: Diagnosis not present

## 2012-04-15 DIAGNOSIS — R262 Difficulty in walking, not elsewhere classified: Secondary | ICD-10-CM | POA: Diagnosis not present

## 2012-04-16 DIAGNOSIS — R262 Difficulty in walking, not elsewhere classified: Secondary | ICD-10-CM | POA: Diagnosis not present

## 2012-04-16 DIAGNOSIS — E119 Type 2 diabetes mellitus without complications: Secondary | ICD-10-CM | POA: Diagnosis not present

## 2012-04-16 DIAGNOSIS — S72009B Fracture of unspecified part of neck of unspecified femur, initial encounter for open fracture type I or II: Secondary | ICD-10-CM | POA: Diagnosis not present

## 2012-04-16 DIAGNOSIS — R279 Unspecified lack of coordination: Secondary | ICD-10-CM | POA: Diagnosis not present

## 2012-04-16 DIAGNOSIS — E039 Hypothyroidism, unspecified: Secondary | ICD-10-CM | POA: Diagnosis not present

## 2012-04-16 DIAGNOSIS — M545 Low back pain: Secondary | ICD-10-CM | POA: Diagnosis not present

## 2012-04-17 DIAGNOSIS — S72009B Fracture of unspecified part of neck of unspecified femur, initial encounter for open fracture type I or II: Secondary | ICD-10-CM | POA: Diagnosis not present

## 2012-04-17 DIAGNOSIS — E039 Hypothyroidism, unspecified: Secondary | ICD-10-CM | POA: Diagnosis not present

## 2012-04-17 DIAGNOSIS — M545 Low back pain: Secondary | ICD-10-CM | POA: Diagnosis not present

## 2012-04-17 DIAGNOSIS — R279 Unspecified lack of coordination: Secondary | ICD-10-CM | POA: Diagnosis not present

## 2012-04-17 DIAGNOSIS — E119 Type 2 diabetes mellitus without complications: Secondary | ICD-10-CM | POA: Diagnosis not present

## 2012-04-17 DIAGNOSIS — R262 Difficulty in walking, not elsewhere classified: Secondary | ICD-10-CM | POA: Diagnosis not present

## 2012-04-18 DIAGNOSIS — M545 Low back pain: Secondary | ICD-10-CM | POA: Diagnosis not present

## 2012-04-18 DIAGNOSIS — R279 Unspecified lack of coordination: Secondary | ICD-10-CM | POA: Diagnosis not present

## 2012-04-18 DIAGNOSIS — E039 Hypothyroidism, unspecified: Secondary | ICD-10-CM | POA: Diagnosis not present

## 2012-04-18 DIAGNOSIS — S72009B Fracture of unspecified part of neck of unspecified femur, initial encounter for open fracture type I or II: Secondary | ICD-10-CM | POA: Diagnosis not present

## 2012-04-18 DIAGNOSIS — E119 Type 2 diabetes mellitus without complications: Secondary | ICD-10-CM | POA: Diagnosis not present

## 2012-04-18 DIAGNOSIS — R262 Difficulty in walking, not elsewhere classified: Secondary | ICD-10-CM | POA: Diagnosis not present

## 2012-04-21 ENCOUNTER — Emergency Department (HOSPITAL_COMMUNITY): Payer: Medicare Other

## 2012-04-21 ENCOUNTER — Encounter (HOSPITAL_COMMUNITY): Payer: Self-pay | Admitting: *Deleted

## 2012-04-21 ENCOUNTER — Emergency Department (HOSPITAL_COMMUNITY)
Admission: EM | Admit: 2012-04-21 | Discharge: 2012-04-21 | Disposition: A | Discharge status: Home / Self Care | Payer: Medicare Other | Attending: Emergency Medicine | Admitting: Emergency Medicine

## 2012-04-21 DIAGNOSIS — Z7901 Long term (current) use of anticoagulants: Secondary | ICD-10-CM | POA: Insufficient documentation

## 2012-04-21 DIAGNOSIS — F329 Major depressive disorder, single episode, unspecified: Secondary | ICD-10-CM | POA: Insufficient documentation

## 2012-04-21 DIAGNOSIS — I10 Essential (primary) hypertension: Secondary | ICD-10-CM | POA: Insufficient documentation

## 2012-04-21 DIAGNOSIS — S60219A Contusion of unspecified wrist, initial encounter: Secondary | ICD-10-CM | POA: Insufficient documentation

## 2012-04-21 DIAGNOSIS — T148XXA Other injury of unspecified body region, initial encounter: Secondary | ICD-10-CM

## 2012-04-21 DIAGNOSIS — Y929 Unspecified place or not applicable: Secondary | ICD-10-CM | POA: Insufficient documentation

## 2012-04-21 DIAGNOSIS — E079 Disorder of thyroid, unspecified: Secondary | ICD-10-CM | POA: Diagnosis not present

## 2012-04-21 DIAGNOSIS — Z79899 Other long term (current) drug therapy: Secondary | ICD-10-CM | POA: Insufficient documentation

## 2012-04-21 DIAGNOSIS — F3289 Other specified depressive episodes: Secondary | ICD-10-CM | POA: Insufficient documentation

## 2012-04-21 DIAGNOSIS — W19XXXA Unspecified fall, initial encounter: Secondary | ICD-10-CM | POA: Insufficient documentation

## 2012-04-21 DIAGNOSIS — M4120 Other idiopathic scoliosis, site unspecified: Secondary | ICD-10-CM | POA: Insufficient documentation

## 2012-04-21 DIAGNOSIS — Z853 Personal history of malignant neoplasm of breast: Secondary | ICD-10-CM | POA: Diagnosis not present

## 2012-04-21 DIAGNOSIS — Y939 Activity, unspecified: Secondary | ICD-10-CM | POA: Insufficient documentation

## 2012-04-21 LAB — CBC WITH DIFFERENTIAL/PLATELET
Eosinophils Absolute: 0.2 10*3/uL (ref 0.0–0.7)
Eosinophils Relative: 2 % (ref 0–5)
HCT: 36.2 % (ref 36.0–46.0)
Hemoglobin: 11.7 g/dL — ABNORMAL LOW (ref 12.0–15.0)
Lymphs Abs: 1.4 10*3/uL (ref 0.7–4.0)
MCH: 25.5 pg — ABNORMAL LOW (ref 26.0–34.0)
MCHC: 32.3 g/dL (ref 30.0–36.0)
MCV: 78.9 fL (ref 78.0–100.0)
Monocytes Absolute: 1 10*3/uL (ref 0.1–1.0)
Monocytes Relative: 13 % — ABNORMAL HIGH (ref 3–12)
RBC: 4.59 MIL/uL (ref 3.87–5.11)

## 2012-04-21 LAB — PROTIME-INR: INR: 2.6 — ABNORMAL HIGH (ref 0.00–1.49)

## 2012-04-21 MED ORDER — OXYCODONE HCL 5 MG PO TABS
10.0000 mg | ORAL_TABLET | Freq: Once | ORAL | Status: AC
  Administered 2012-04-21: 10 mg via ORAL
  Filled 2012-04-21: qty 2

## 2012-04-21 NOTE — ED Notes (Signed)
ZOX:[id number]<AV> Expected date:<BR> Expected time:<BR> Means of arrival:<BR> Comments:<BR> For EMS fall

## 2012-04-21 NOTE — ED Notes (Signed)
Per EMS, pt from [facility].  [name] Saturday am refused to be checked out.  This am, she stood up, c/o back pain and L wrist pain.  Per EMS, pt's L wrist is reddened and swollen and warm to touch.  Pt is A&O x 4.

## 2012-04-21 NOTE — ED Provider Notes (Signed)
History     CSN: [id number] Arrival date & time 04/21/12  4098 First MD Initiated Contact with Patient 04/21/12 [phone]    Chief Complaint  Patient presents with  . Fall   HPI The patient presents to the emergency room after a fall early Sunday morning per the patient. According to the EMS notes from the nursing home this occurred Saturday morning. Patient initially did not want to be seen. She states she was fine yesterday and was able to go to church without any difficulty. This morning when she woke up she noticed that her left hand was bruised and swollen. She is also having some pain in her back. She denies any new numbness or weakness. She still was able to walk this morning. She denies any significant pain at this time, stating the pain is mild and she does not require anything for her discomfort at this time. Patient does take Coumadin. She has not noticed any new bleeding. Past Medical History  Diagnosis Date  . Scoliosis   . Depression   . Breast cancer   . Hypertension   . Thyroid disease     Past Surgical History  Procedure Date  . Hip fracture surgery     No family history on file.  History  Substance Use Topics  . Smoking status: Never Smoker   . Smokeless tobacco: Not on file  . Alcohol Use: No    OB History    Grav Para Term Preterm Abortions TAB SAB Ect Mult Living                  Review of Systems  All other systems reviewed and are negative.    Allergies  Sulfa antibiotics  Home Medications   Current Outpatient Rx  Name  Route  Sig  Dispense  Refill  . ACETAMINOPHEN 325 MG PO TABS   Oral   Take 325 mg by mouth every 4 (four) hours as needed. For pain/headache/fever          . AMIODARONE HCL 200 MG PO TABS   Oral   Take 200 mg by mouth daily.           Marland Kitchen AMLODIPINE BESYLATE 10 MG PO TABS   Oral   Take 10 mg by mouth daily.           Marland Kitchen BACLOFEN 10 MG PO TABS   Oral   Take 10 mg by mouth 3 (three) times daily.           Marland Kitchen  CICLOPIROX OLAMINE 0.77 % EX CREA   Topical   Apply 1 application topically 2 (two) times daily. To affected areas on all toenails          . DIAZEPAM 5 MG PO TABS   Oral   Take 5 mg by mouth every 6 (six) hours as needed. For anxiety          . DOCUSATE SODIUM 100 MG PO CAPS   Oral   Take 100 mg by mouth daily. & as needed for constipation          . FUROSEMIDE 80 MG PO TABS   Oral   Take 80 mg by mouth every morning.           Marland Kitchen GABAPENTIN (PHN) 600 MG PO TABS   Oral   Take 1 tablet by mouth daily with supper.           Marland Kitchen LEVOTHYROXINE SODIUM 50 MCG PO TABS  Oral   Take 50 mcg by mouth daily.           Marland Kitchen LOPERAMIDE HCL 2 MG PO CAPS   Oral   Take 2 mg by mouth 4 (four) times daily as needed. For loose stools/diarrhea          . THERA M PLUS PO TABS   Oral   Take 1 tablet by mouth daily.           . OXYCODONE HCL 15 MG PO TABS   Oral   Take 15 mg by mouth every 4 (four) hours.           Marland Kitchen POLYETHYLENE GLYCOL 3350 PO PACK   Oral   Take 17 g by mouth daily as needed. For constipation.          Marland Kitchen POTASSIUM CHLORIDE CRYS ER 20 MEQ PO TBCR   Oral   Take 20 mEq by mouth daily.           Marland Kitchen TAMOXIFEN CITRATE 10 MG PO TABS   Oral   Take 10 mg by mouth daily.           . TOLNAFTATE 1 % EX POWD   Topical   Apply 1 application topically 3 (three) times daily as needed.           . VENLAFAXINE HCL ER 150 MG PO CP24   Oral   Take 150 mg by mouth daily.           . WARFARIN SODIUM 2.5 MG PO TABS   Oral   Take 2.5-5 mg by mouth daily. Mondays through Friday=2.5 mg  Saturdays & Sundays=5 mg            BP 134/53  Pulse 61  Temp 98.1 F (36.7 C) (Oral)  Resp 20  SpO2 96%  Physical Exam  Nursing note and vitals reviewed. Constitutional: She appears well-developed and well-nourished. No distress.  HENT:  Head: Normocephalic and atraumatic.  Right Ear: External ear normal.  Left Ear: External ear normal.  Eyes: Conjunctivae normal  are normal. Right eye exhibits no discharge. Left eye exhibits no discharge. No scleral icterus.  Neck: Neck supple. No tracheal deviation present.  Cardiovascular: Normal rate, regular rhythm and intact distal pulses.   Pulmonary/Chest: Effort normal and breath sounds normal. No stridor. No respiratory distress. She has no wheezes. She has no rales.  Abdominal: Soft. Bowel sounds are normal. She exhibits no distension. There is no tenderness. There is no rebound and no guarding.  Musculoskeletal: She exhibits edema (bilateral lower extrem, erythema bilateral lower extrem, no ttp, no increased warmth). She exhibits no tenderness.       Thoracic back: She exhibits bony tenderness.       Lumbar back: She exhibits bony tenderness.       Small hematoma distal thoracic/proximal lumbar spine region, mild ttp; left hand with ecchymoses and edema, no discrete are of ttp hand or wrist, distal nv intact  Neurological: She is alert. She has normal strength. No sensory deficit. Cranial nerve deficit:  no gross defecits noted. She exhibits normal muscle tone. She displays no seizure activity. Coordination normal.  Skin: Skin is warm and dry. No rash noted.  Psychiatric: She has a normal mood and affect.    ED Course  Procedures (including critical care time)  Labs Reviewed  CBC WITH DIFFERENTIAL - Abnormal; Notable for the following:    Hemoglobin 11.7 (*)     MCH 25.5 (*)  RDW 16.6 (*)     Monocytes Relative 13 (*)     All other components within normal limits  PROTIME-INR - Abnormal; Notable for the following:    Prothrombin Time 26.6 (*)     INR 2.60 (*)     All other components within normal limits   Dg Thoracic Spine 2 View  04/21/2012  *RADIOLOGY REPORT*  Clinical Data: Fall.  The back injury and back pain.  THORACIC SPINE - 2 VIEW  Comparison: None.  Findings: No evidence of acute fracture or subluxation. Intervertebral disc spaces are maintained.  Generalized osteopenia noted.  No focal  lytic or sclerotic bone lesions identified.  IMPRESSION:  1.  No acute findings. 2.  Osteopenia.   Original Report Authenticated By: Myles Rosenthal, M.D.    Dg Lumbar Spine Complete  04/21/2012  *RADIOLOGY REPORT*  Clinical Data: Fall.  Low back injury and back pain.  LUMBAR SPINE - COMPLETE 4+ VIEW  Comparison: 03/08/2011  Findings: No evidence of acute lumbar spine fracture or spondylolisthesis.  Diffuse osteopenia again demonstrated as well as severe lumbar kyphoscoliosis.  Severe degenerative disc disease is seen in the upper and mid lumbar spine with less severe degenerative disc disease at L4-5.  Bilateral facet DJD also noted. Generalized osteopenia is seen, without evidence of focal lytic or sclerotic bone lesions.  IMPRESSION:  1.  No acute findings. 2.  No significant change in severe lumbar spondylosis and kyphoscoliosis. 3.  Osteopenia.   Original Report Authenticated By: Myles Rosenthal, M.D.    Dg Wrist Complete Left  04/21/2012  *RADIOLOGY REPORT*  Clinical Data: Fall, wrist injury  LEFT WRIST - COMPLETE 3+ VIEW  Comparison: None.  Findings: No fracture is seen.  Widening of the scapholunate joint, measuring 4 mm.  Possible mild soft tissue swelling at the wrist.  Negative ulnar variance.  IMPRESSION: No fracture is seen.  Widening of the scapholunate joint, suggesting scapholunate ligament injury, although possibly chronic.   Original Report Authenticated By: Charline Bills, M.D.    Dg Hand Complete Left  04/21/2012  *RADIOLOGY REPORT*  Clinical Data: Fall, injury to the hand and wrist  LEFT HAND - COMPLETE 3+ VIEW  Comparison: None.  Findings: No evidence of acute fracture or dislocation.  Suspected old, healed avulsion injury at the base of the first distal phalanx.  Additional suspected post-traumatic deformity with degenerative changes at the first carpometacarpal joint.  Degenerative changes at the second and third DIP joints and third PIP joint.  Visualized soft tissues are grossly  unremarkable.  IMPRESSION: No evidence of acute fracture or dislocation.  Suspected post-traumatic changes involving the first digit.  Degenerative changes, as described above.   Original Report Authenticated By: Charline Bills, M.D.      1. Contusion   2. Fall       MDM  No sign of serious injury associated with her fall.    Will dc back to the nursing facility.  Findings noted on the wrist films.  Suspect that is more chronic as she is not really having any pain with movement or palpation of her hand/wrist.       Celene Kras, MD 04/21/12 1128

## 2012-04-22 DIAGNOSIS — M545 Low back pain: Secondary | ICD-10-CM | POA: Diagnosis not present

## 2012-04-22 DIAGNOSIS — E119 Type 2 diabetes mellitus without complications: Secondary | ICD-10-CM | POA: Diagnosis not present

## 2012-04-22 DIAGNOSIS — R262 Difficulty in walking, not elsewhere classified: Secondary | ICD-10-CM | POA: Diagnosis not present

## 2012-04-22 DIAGNOSIS — E039 Hypothyroidism, unspecified: Secondary | ICD-10-CM | POA: Diagnosis not present

## 2012-04-22 DIAGNOSIS — R279 Unspecified lack of coordination: Secondary | ICD-10-CM | POA: Diagnosis not present

## 2012-04-22 DIAGNOSIS — S72009B Fracture of unspecified part of neck of unspecified femur, initial encounter for open fracture type I or II: Secondary | ICD-10-CM | POA: Diagnosis not present

## 2012-04-23 DIAGNOSIS — M545 Low back pain: Secondary | ICD-10-CM | POA: Diagnosis not present

## 2012-04-23 DIAGNOSIS — S72009B Fracture of unspecified part of neck of unspecified femur, initial encounter for open fracture type I or II: Secondary | ICD-10-CM | POA: Diagnosis not present

## 2012-04-23 DIAGNOSIS — R262 Difficulty in walking, not elsewhere classified: Secondary | ICD-10-CM | POA: Diagnosis not present

## 2012-04-23 DIAGNOSIS — R279 Unspecified lack of coordination: Secondary | ICD-10-CM | POA: Diagnosis not present

## 2012-04-23 DIAGNOSIS — E119 Type 2 diabetes mellitus without complications: Secondary | ICD-10-CM | POA: Diagnosis not present

## 2012-04-23 DIAGNOSIS — E039 Hypothyroidism, unspecified: Secondary | ICD-10-CM | POA: Diagnosis not present

## 2012-04-24 DIAGNOSIS — S72009B Fracture of unspecified part of neck of unspecified femur, initial encounter for open fracture type I or II: Secondary | ICD-10-CM | POA: Diagnosis not present

## 2012-04-24 DIAGNOSIS — E119 Type 2 diabetes mellitus without complications: Secondary | ICD-10-CM | POA: Diagnosis not present

## 2012-04-24 DIAGNOSIS — R279 Unspecified lack of coordination: Secondary | ICD-10-CM | POA: Diagnosis not present

## 2012-04-24 DIAGNOSIS — R262 Difficulty in walking, not elsewhere classified: Secondary | ICD-10-CM | POA: Diagnosis not present

## 2012-04-24 DIAGNOSIS — M545 Low back pain: Secondary | ICD-10-CM | POA: Diagnosis not present

## 2012-04-24 DIAGNOSIS — E039 Hypothyroidism, unspecified: Secondary | ICD-10-CM | POA: Diagnosis not present

## 2012-04-25 DIAGNOSIS — M545 Low back pain: Secondary | ICD-10-CM | POA: Diagnosis not present

## 2012-04-25 DIAGNOSIS — R279 Unspecified lack of coordination: Secondary | ICD-10-CM | POA: Diagnosis not present

## 2012-04-25 DIAGNOSIS — E039 Hypothyroidism, unspecified: Secondary | ICD-10-CM | POA: Diagnosis not present

## 2012-04-25 DIAGNOSIS — S72009B Fracture of unspecified part of neck of unspecified femur, initial encounter for open fracture type I or II: Secondary | ICD-10-CM | POA: Diagnosis not present

## 2012-04-25 DIAGNOSIS — R262 Difficulty in walking, not elsewhere classified: Secondary | ICD-10-CM | POA: Diagnosis not present

## 2012-04-25 DIAGNOSIS — E119 Type 2 diabetes mellitus without complications: Secondary | ICD-10-CM | POA: Diagnosis not present

## 2012-04-28 DIAGNOSIS — S72009B Fracture of unspecified part of neck of unspecified femur, initial encounter for open fracture type I or II: Secondary | ICD-10-CM | POA: Diagnosis not present

## 2012-04-28 DIAGNOSIS — M545 Low back pain: Secondary | ICD-10-CM | POA: Diagnosis not present

## 2012-04-28 DIAGNOSIS — R262 Difficulty in walking, not elsewhere classified: Secondary | ICD-10-CM | POA: Diagnosis not present

## 2012-04-28 DIAGNOSIS — R279 Unspecified lack of coordination: Secondary | ICD-10-CM | POA: Diagnosis not present

## 2012-04-28 DIAGNOSIS — E039 Hypothyroidism, unspecified: Secondary | ICD-10-CM | POA: Diagnosis not present

## 2012-04-28 DIAGNOSIS — E119 Type 2 diabetes mellitus without complications: Secondary | ICD-10-CM | POA: Diagnosis not present

## 2012-04-29 DIAGNOSIS — E119 Type 2 diabetes mellitus without complications: Secondary | ICD-10-CM | POA: Diagnosis not present

## 2012-04-29 DIAGNOSIS — E039 Hypothyroidism, unspecified: Secondary | ICD-10-CM | POA: Diagnosis not present

## 2012-04-29 DIAGNOSIS — M545 Low back pain: Secondary | ICD-10-CM | POA: Diagnosis not present

## 2012-04-29 DIAGNOSIS — R279 Unspecified lack of coordination: Secondary | ICD-10-CM | POA: Diagnosis not present

## 2012-04-29 DIAGNOSIS — R262 Difficulty in walking, not elsewhere classified: Secondary | ICD-10-CM | POA: Diagnosis not present

## 2012-04-29 DIAGNOSIS — S72009B Fracture of unspecified part of neck of unspecified femur, initial encounter for open fracture type I or II: Secondary | ICD-10-CM | POA: Diagnosis not present

## 2012-04-30 DIAGNOSIS — M545 Low back pain: Secondary | ICD-10-CM | POA: Diagnosis not present

## 2012-04-30 DIAGNOSIS — S72009B Fracture of unspecified part of neck of unspecified femur, initial encounter for open fracture type I or II: Secondary | ICD-10-CM | POA: Diagnosis not present

## 2012-04-30 DIAGNOSIS — R262 Difficulty in walking, not elsewhere classified: Secondary | ICD-10-CM | POA: Diagnosis not present

## 2012-04-30 DIAGNOSIS — E119 Type 2 diabetes mellitus without complications: Secondary | ICD-10-CM | POA: Diagnosis not present

## 2012-04-30 DIAGNOSIS — R279 Unspecified lack of coordination: Secondary | ICD-10-CM | POA: Diagnosis not present

## 2012-04-30 DIAGNOSIS — E039 Hypothyroidism, unspecified: Secondary | ICD-10-CM | POA: Diagnosis not present

## 2012-05-01 DIAGNOSIS — R262 Difficulty in walking, not elsewhere classified: Secondary | ICD-10-CM | POA: Diagnosis not present

## 2012-05-01 DIAGNOSIS — E039 Hypothyroidism, unspecified: Secondary | ICD-10-CM | POA: Diagnosis not present

## 2012-05-01 DIAGNOSIS — E119 Type 2 diabetes mellitus without complications: Secondary | ICD-10-CM | POA: Diagnosis not present

## 2012-05-01 DIAGNOSIS — R279 Unspecified lack of coordination: Secondary | ICD-10-CM | POA: Diagnosis not present

## 2012-05-01 DIAGNOSIS — S72009B Fracture of unspecified part of neck of unspecified femur, initial encounter for open fracture type I or II: Secondary | ICD-10-CM | POA: Diagnosis not present

## 2012-05-01 DIAGNOSIS — M545 Low back pain: Secondary | ICD-10-CM | POA: Diagnosis not present

## 2012-05-02 DIAGNOSIS — S72009B Fracture of unspecified part of neck of unspecified femur, initial encounter for open fracture type I or II: Secondary | ICD-10-CM | POA: Diagnosis not present

## 2012-05-02 DIAGNOSIS — E119 Type 2 diabetes mellitus without complications: Secondary | ICD-10-CM | POA: Diagnosis not present

## 2012-05-02 DIAGNOSIS — R262 Difficulty in walking, not elsewhere classified: Secondary | ICD-10-CM | POA: Diagnosis not present

## 2012-05-02 DIAGNOSIS — E039 Hypothyroidism, unspecified: Secondary | ICD-10-CM | POA: Diagnosis not present

## 2012-05-02 DIAGNOSIS — M545 Low back pain: Secondary | ICD-10-CM | POA: Diagnosis not present

## 2012-05-02 DIAGNOSIS — R279 Unspecified lack of coordination: Secondary | ICD-10-CM | POA: Diagnosis not present

## 2012-05-05 DIAGNOSIS — E119 Type 2 diabetes mellitus without complications: Secondary | ICD-10-CM | POA: Diagnosis not present

## 2012-05-05 DIAGNOSIS — R279 Unspecified lack of coordination: Secondary | ICD-10-CM | POA: Diagnosis not present

## 2012-05-05 DIAGNOSIS — E039 Hypothyroidism, unspecified: Secondary | ICD-10-CM | POA: Diagnosis not present

## 2012-05-05 DIAGNOSIS — S72009B Fracture of unspecified part of neck of unspecified femur, initial encounter for open fracture type I or II: Secondary | ICD-10-CM | POA: Diagnosis not present

## 2012-05-05 DIAGNOSIS — R262 Difficulty in walking, not elsewhere classified: Secondary | ICD-10-CM | POA: Diagnosis not present

## 2012-05-05 DIAGNOSIS — M545 Low back pain: Secondary | ICD-10-CM | POA: Diagnosis not present

## 2012-05-06 DIAGNOSIS — R262 Difficulty in walking, not elsewhere classified: Secondary | ICD-10-CM | POA: Diagnosis not present

## 2012-05-06 DIAGNOSIS — M545 Low back pain: Secondary | ICD-10-CM | POA: Diagnosis not present

## 2012-05-06 DIAGNOSIS — R279 Unspecified lack of coordination: Secondary | ICD-10-CM | POA: Diagnosis not present

## 2012-05-06 DIAGNOSIS — S72009B Fracture of unspecified part of neck of unspecified femur, initial encounter for open fracture type I or II: Secondary | ICD-10-CM | POA: Diagnosis not present

## 2012-05-06 DIAGNOSIS — E039 Hypothyroidism, unspecified: Secondary | ICD-10-CM | POA: Diagnosis not present

## 2012-05-06 DIAGNOSIS — E119 Type 2 diabetes mellitus without complications: Secondary | ICD-10-CM | POA: Diagnosis not present

## 2012-05-08 DIAGNOSIS — E039 Hypothyroidism, unspecified: Secondary | ICD-10-CM | POA: Diagnosis not present

## 2012-05-08 DIAGNOSIS — R279 Unspecified lack of coordination: Secondary | ICD-10-CM | POA: Diagnosis not present

## 2012-05-08 DIAGNOSIS — M545 Low back pain: Secondary | ICD-10-CM | POA: Diagnosis not present

## 2012-05-08 DIAGNOSIS — E119 Type 2 diabetes mellitus without complications: Secondary | ICD-10-CM | POA: Diagnosis not present

## 2012-05-08 DIAGNOSIS — R262 Difficulty in walking, not elsewhere classified: Secondary | ICD-10-CM | POA: Diagnosis not present

## 2012-05-08 DIAGNOSIS — S72009B Fracture of unspecified part of neck of unspecified femur, initial encounter for open fracture type I or II: Secondary | ICD-10-CM | POA: Diagnosis not present

## 2012-05-09 DIAGNOSIS — R262 Difficulty in walking, not elsewhere classified: Secondary | ICD-10-CM | POA: Diagnosis not present

## 2012-05-09 DIAGNOSIS — S72009B Fracture of unspecified part of neck of unspecified femur, initial encounter for open fracture type I or II: Secondary | ICD-10-CM | POA: Diagnosis not present

## 2012-05-09 DIAGNOSIS — M545 Low back pain: Secondary | ICD-10-CM | POA: Diagnosis not present

## 2012-05-09 DIAGNOSIS — E039 Hypothyroidism, unspecified: Secondary | ICD-10-CM | POA: Diagnosis not present

## 2012-05-09 DIAGNOSIS — E119 Type 2 diabetes mellitus without complications: Secondary | ICD-10-CM | POA: Diagnosis not present

## 2012-05-09 DIAGNOSIS — R279 Unspecified lack of coordination: Secondary | ICD-10-CM | POA: Diagnosis not present

## 2012-05-12 DIAGNOSIS — S72009B Fracture of unspecified part of neck of unspecified femur, initial encounter for open fracture type I or II: Secondary | ICD-10-CM | POA: Diagnosis not present

## 2012-05-12 DIAGNOSIS — R279 Unspecified lack of coordination: Secondary | ICD-10-CM | POA: Diagnosis not present

## 2012-05-12 DIAGNOSIS — E039 Hypothyroidism, unspecified: Secondary | ICD-10-CM | POA: Diagnosis not present

## 2012-05-12 DIAGNOSIS — M545 Low back pain: Secondary | ICD-10-CM | POA: Diagnosis not present

## 2012-05-12 DIAGNOSIS — R262 Difficulty in walking, not elsewhere classified: Secondary | ICD-10-CM | POA: Diagnosis not present

## 2012-05-12 DIAGNOSIS — E119 Type 2 diabetes mellitus without complications: Secondary | ICD-10-CM | POA: Diagnosis not present

## 2012-05-13 DIAGNOSIS — M545 Low back pain: Secondary | ICD-10-CM | POA: Diagnosis not present

## 2012-05-13 DIAGNOSIS — R279 Unspecified lack of coordination: Secondary | ICD-10-CM | POA: Diagnosis not present

## 2012-05-13 DIAGNOSIS — E039 Hypothyroidism, unspecified: Secondary | ICD-10-CM | POA: Diagnosis not present

## 2012-05-13 DIAGNOSIS — S72009B Fracture of unspecified part of neck of unspecified femur, initial encounter for open fracture type I or II: Secondary | ICD-10-CM | POA: Diagnosis not present

## 2012-05-13 DIAGNOSIS — R262 Difficulty in walking, not elsewhere classified: Secondary | ICD-10-CM | POA: Diagnosis not present

## 2012-05-13 DIAGNOSIS — E119 Type 2 diabetes mellitus without complications: Secondary | ICD-10-CM | POA: Diagnosis not present

## 2012-05-15 DIAGNOSIS — S72009B Fracture of unspecified part of neck of unspecified femur, initial encounter for open fracture type I or II: Secondary | ICD-10-CM | POA: Diagnosis not present

## 2012-05-15 DIAGNOSIS — E039 Hypothyroidism, unspecified: Secondary | ICD-10-CM | POA: Diagnosis not present

## 2012-05-15 DIAGNOSIS — E119 Type 2 diabetes mellitus without complications: Secondary | ICD-10-CM | POA: Diagnosis not present

## 2012-05-15 DIAGNOSIS — M6281 Muscle weakness (generalized): Secondary | ICD-10-CM | POA: Diagnosis not present

## 2012-05-15 DIAGNOSIS — M545 Low back pain, unspecified: Secondary | ICD-10-CM | POA: Diagnosis not present

## 2012-05-15 DIAGNOSIS — R279 Unspecified lack of coordination: Secondary | ICD-10-CM | POA: Diagnosis not present

## 2012-05-15 DIAGNOSIS — R262 Difficulty in walking, not elsewhere classified: Secondary | ICD-10-CM | POA: Diagnosis not present

## 2012-08-04 DIAGNOSIS — B351 Tinea unguium: Secondary | ICD-10-CM | POA: Diagnosis not present

## 2012-08-04 DIAGNOSIS — M79609 Pain in unspecified limb: Secondary | ICD-10-CM | POA: Diagnosis not present

## 2012-08-16 ENCOUNTER — Encounter (HOSPITAL_COMMUNITY): Payer: Self-pay | Admitting: Emergency Medicine

## 2012-08-16 ENCOUNTER — Emergency Department (HOSPITAL_COMMUNITY): Payer: Medicare Other

## 2012-08-16 ENCOUNTER — Emergency Department (HOSPITAL_COMMUNITY)
Admission: EM | Admit: 2012-08-16 | Discharge: 2012-08-17 | Disposition: A | Discharge status: Home / Self Care | Payer: Medicare Other | Attending: Emergency Medicine | Admitting: Emergency Medicine

## 2012-08-16 DIAGNOSIS — F411 Generalized anxiety disorder: Secondary | ICD-10-CM | POA: Insufficient documentation

## 2012-08-16 DIAGNOSIS — Y9301 Activity, walking, marching and hiking: Secondary | ICD-10-CM | POA: Insufficient documentation

## 2012-08-16 DIAGNOSIS — W010XXA Fall on same level from slipping, tripping and stumbling without subsequent striking against object, initial encounter: Secondary | ICD-10-CM | POA: Insufficient documentation

## 2012-08-16 DIAGNOSIS — F3289 Other specified depressive episodes: Secondary | ICD-10-CM | POA: Insufficient documentation

## 2012-08-16 DIAGNOSIS — S0990XA Unspecified injury of head, initial encounter: Secondary | ICD-10-CM | POA: Insufficient documentation

## 2012-08-16 DIAGNOSIS — Z79899 Other long term (current) drug therapy: Secondary | ICD-10-CM | POA: Insufficient documentation

## 2012-08-16 DIAGNOSIS — I10 Essential (primary) hypertension: Secondary | ICD-10-CM | POA: Insufficient documentation

## 2012-08-16 DIAGNOSIS — IMO0002 Reserved for concepts with insufficient information to code with codable children: Secondary | ICD-10-CM | POA: Diagnosis not present

## 2012-08-16 DIAGNOSIS — Z853 Personal history of malignant neoplasm of breast: Secondary | ICD-10-CM | POA: Insufficient documentation

## 2012-08-16 DIAGNOSIS — S4980XA Other specified injuries of shoulder and upper arm, unspecified arm, initial encounter: Secondary | ICD-10-CM | POA: Insufficient documentation

## 2012-08-16 DIAGNOSIS — E079 Disorder of thyroid, unspecified: Secondary | ICD-10-CM | POA: Insufficient documentation

## 2012-08-16 DIAGNOSIS — F329 Major depressive disorder, single episode, unspecified: Secondary | ICD-10-CM | POA: Diagnosis not present

## 2012-08-16 DIAGNOSIS — Z7901 Long term (current) use of anticoagulants: Secondary | ICD-10-CM | POA: Diagnosis not present

## 2012-08-16 DIAGNOSIS — W19XXXA Unspecified fall, initial encounter: Secondary | ICD-10-CM

## 2012-08-16 DIAGNOSIS — Z8739 Personal history of other diseases of the musculoskeletal system and connective tissue: Secondary | ICD-10-CM | POA: Diagnosis not present

## 2012-08-16 DIAGNOSIS — S298XXA Other specified injuries of thorax, initial encounter: Secondary | ICD-10-CM | POA: Insufficient documentation

## 2012-08-16 DIAGNOSIS — S46909A Unspecified injury of unspecified muscle, fascia and tendon at shoulder and upper arm level, unspecified arm, initial encounter: Secondary | ICD-10-CM | POA: Insufficient documentation

## 2012-08-16 DIAGNOSIS — Y92009 Unspecified place in unspecified non-institutional (private) residence as the place of occurrence of the external cause: Secondary | ICD-10-CM | POA: Insufficient documentation

## 2012-08-16 HISTORY — DX: Anxiety disorder, unspecified: F41.9

## 2012-08-16 MED ORDER — OXYCODONE-ACETAMINOPHEN 5-325 MG PO TABS
2.0000 | ORAL_TABLET | Freq: Once | ORAL | Status: AC
  Administered 2012-08-17: 2 via ORAL
  Filled 2012-08-16: qty 2

## 2012-08-16 NOTE — ED Provider Notes (Signed)
History     CSN: [id number]  Arrival date & time 08/16/12  2213   First MD Initiated Contact with Patient 08/16/12 2214      Chief Complaint  Patient presents with  . Fall    (Consider location/radiation/quality/duration/timing/severity/associated sxs/prior treatment) Patient is a 79 y.o. female presenting with fall. The history is provided by the patient, medical records and the EMS personnel. No language interpreter was used.  Fall The accident occurred 3 to 5 hours ago. The fall occurred while walking. Distance fallen: ground level. She landed on a hard floor. There was no blood loss. The point of impact was the left shoulder. Pain location: L ribs. The pain is at a severity of 6/10. The pain is moderate. She was ambulatory at the scene. There was no entrapment after the fall. There was no drug use involved in the accident. There was no alcohol use involved in the accident. Pertinent negatives include no visual change, no fever, no numbness, no abdominal pain, no bowel incontinence, no nausea, no vomiting, no hematuria, no headaches, no loss of consciousness and no tingling. The symptoms are aggravated by pressure on the injury.    [name] is a 79 y.o. female  with a hx of scoliosis, depression, breast cancer, hypertension, anxiety, fracture presents to the Emergency Department complaining of acute onset of left-sided rib pain in the back pain after fall at 5 PM. Patient states her how she became stuck on the floor and she tripped and fell. At that time she was holding a coffee cup and did not want to break it therefore she did not catch herself.  Patient states she landed on her left side.  Patient states she thinks she may have hit her head however she did not lose consciousness he is not complaining of neck pain at this time. Patient states she did not tell the facility because she knew that make her come to the emergency department and she wanted to eat dinner fist. Associated  symptoms include low back pain, left rib pain.  Nothing makes it better and palpation makes it worse.  Pt denies fever, chills, headache, neck pain, abdominal pain, nausea, vomiting, diarrhea, weakness, dizziness, syncope, dysuria, hematuria.     Past Medical History  Diagnosis Date  . Scoliosis   . Depression   . Breast cancer   . Hypertension   . Thyroid disease   . Anxiety     Past Surgical History  Procedure Laterality Date  . Hip fracture surgery      No family history on file.  History  Substance Use Topics  . Smoking status: Never Smoker   . Smokeless tobacco: Not on file  . Alcohol Use: No    OB History   Grav Para Term Preterm Abortions TAB SAB Ect Mult Living                  Review of Systems  Constitutional: Negative for fever, diaphoresis, appetite change, fatigue and unexpected weight change.  HENT: Negative for mouth sores and neck stiffness.   Eyes: Negative for visual disturbance.  Respiratory: Negative for cough, chest tightness, shortness of breath and wheezing.   Cardiovascular: Negative for chest pain.  Gastrointestinal: Negative for nausea, vomiting, abdominal pain, diarrhea, constipation and bowel incontinence.  Endocrine: Negative for polydipsia, polyphagia and polyuria.  Genitourinary: Negative for dysuria, urgency, frequency and hematuria.  Musculoskeletal: Positive for arthralgias (rib pain). Negative for back pain.  Skin: Negative for rash.  Allergic/Immunologic: Negative  for immunocompromised state.  Neurological: Negative for tingling, loss of consciousness, syncope, light-headedness, numbness and headaches.  Hematological: Does not bruise/bleed easily.  Psychiatric/Behavioral: Negative for sleep disturbance. The patient is not nervous/anxious.     Allergies  Sulfa antibiotics  Home Medications   Current Outpatient Rx  Name  Route  Sig  Dispense  Refill  . acetaminophen (TYLENOL) 325 MG tablet   Oral   Take 325 mg by mouth every  4 (four) hours as needed. For pain/headache/fever          . amiodarone (PACERONE) 200 MG tablet   Oral   Take 200 mg by mouth daily.           Marland Kitchen amLODipine (NORVASC) 10 MG tablet   Oral   Take 10 mg by mouth daily.           . baclofen (LIORESAL) 10 MG tablet   Oral   Take 10 mg by mouth 3 (three) times daily.           . benazepril (LOTENSIN) 20 MG tablet   Oral   Take 20 mg by mouth every morning.         . ciclopirox (LOPROX) 0.77 % cream   Topical   Apply 1 application topically 2 (two) times daily. To affected areas on all toenails          . diazepam (VALIUM) 5 MG tablet   Oral   Take 5 mg by mouth every 6 (six) hours as needed. For anxiety          . docusate sodium (COLACE) 100 MG capsule   Oral   Take 100 mg by mouth daily. & as needed for constipation          . furosemide (LASIX) 80 MG tablet   Oral   Take 80 mg by mouth every morning.           . Gabapentin, PHN, (GRALISE) 600 MG TABS   Oral   Take 1 tablet by mouth daily with supper.           . levothyroxine (SYNTHROID, LEVOTHROID) 50 MCG tablet   Oral   Take 50 mcg by mouth daily.           Marland Kitchen lidocaine (LIDODERM) 5 %   Transdermal   Place 1-3 patches onto the skin daily. Remove & Discard patch within 12 hours or as directed by MD         . loperamide (IMODIUM) 2 MG capsule   Oral   Take 2 mg by mouth 4 (four) times daily as needed. For loose stools/diarrhea          . Multiple Vitamins-Minerals (MULTIVITAMINS THER. W/MINERALS) TABS   Oral   Take 1 tablet by mouth daily.           . Oxycodone HCl 10 MG TABS   Oral   Take 10 mg by mouth every 4 (four) hours as needed. pain         . polyethylene glycol (MIRALAX / GLYCOLAX) packet   Oral   Take 17 g by mouth daily as needed. For constipation.          . potassium chloride SA (K-DUR,KLOR-CON) 20 MEQ tablet   Oral   Take 20 mEq by mouth daily.           . tamoxifen (NOLVADEX) 10 MG tablet   Oral   Take 10  mg by mouth daily.           Marland Kitchen  tolnaftate (TINACTIN) 1 % powder   Topical   Apply 1 application topically 3 (three) times daily as needed.           . venlafaxine (EFFEXOR-XR) 150 MG 24 hr capsule   Oral   Take 150 mg by mouth daily.           Marland Kitchen warfarin (COUMADIN) 2.5 MG tablet   Oral   Take 2.5 mg by mouth daily.          Marland Kitchen HYDROcodone-acetaminophen (NORCO/VICODIN) 5-325 MG per tablet   Oral   Take 1 tablet by mouth every 4 (four) hours as needed for pain.   10 tablet   0     BP 139/52  Pulse 56  Temp(Src) 98.4 F (36.9 C) (Oral)  Resp 18  Ht 5' (1.524 m)  Wt 185 lb (83.915 kg)  BMI 36.13 kg/m2  SpO2 98%  Physical Exam  Nursing note and vitals reviewed. Constitutional: She is oriented to person, place, and time. She appears well-developed and well-nourished. No distress.  HENT:  Head: Normocephalic and atraumatic.  Mouth/Throat: Oropharynx is clear and moist. No oropharyngeal exudate.  Eyes: Conjunctivae and EOM are normal. Pupils are equal, round, and reactive to light. No scleral icterus.  Neck: Normal range of motion. Neck supple.  Cardiovascular: Normal rate, regular rhythm and intact distal pulses.   Pulmonary/Chest: Effort normal and breath sounds normal. No accessory muscle usage. Not tachypneic. No respiratory distress. She has no decreased breath sounds. She has no wheezes. She has no rhonchi. She has no rales. She exhibits tenderness (L rib pain).    Abdominal: Soft. Normal appearance and bowel sounds are normal. She exhibits no mass. There is no tenderness. There is no rigidity, no rebound, no guarding, no CVA tenderness, no tenderness at McBurney's point and negative Murphy's sign.  Obese  Musculoskeletal: Normal range of motion. She exhibits edema (3+ pitting of the lower extremities, baseline per patient).  Lymphadenopathy:    She has no cervical adenopathy.  Neurological: She is alert and oriented to person, place, and time. No cranial nerve  deficit. She exhibits normal muscle tone. Coordination normal.  Speech is clear and goal oriented, follows commands Major Cranial nerves without deficit, no facial droop Normal strength in upper and lower extremities bilaterally including dorsiflexion and plantar flexion, strong and equal grip strength Sensation normal to light and sharp touch Moves extremities without ataxia, coordination intact Normal finger to nose and rapid alternating movements Neg modified romberg, no pronator drift Pt walks to her baseline with walker   Skin: Skin is warm and dry. No rash noted. She is not diaphoretic. There is erythema (of legs, noncellulitic).  Psychiatric: She has a normal mood and affect.    ED Course  Procedures (including critical care time)  Labs Reviewed - No data to display Dg Chest 2 View  08/16/2012  *RADIOLOGY REPORT*  Clinical Data: Left flank pain, lower back pain.  The patient fell this afternoon from standing height.  History of edema lower extremities.  History of hypertension.  CHEST - 2 VIEW  Comparison: 03/08/2011  Findings: The heart is enlarged.  There is pulmonary vascular congestion.  Mild interstitial edema is present.  The aorta is tortuous.  There are no focal consolidations or pleural effusions. Surgical clips are identified within the upper abdomen.  IMPRESSION:  1.  Cardiomegaly and mild interstitial edema. 2. No focal pulmonary abnormality.   Original Report Authenticated By: Norva Pavlov, M.D.    Dg Lumbar  Spine Complete  08/17/2012  *RADIOLOGY REPORT*  Clinical Data: Low back pain and left-sided pain after fall.  LUMBAR SPINE - COMPLETE 4+ VIEW  Comparison: 04/21/2012  Findings: There appear to be five lumbar type vertebrae with atrophic 12th ribs.  Severe lumbar scoliosis convex towards the left.  Severe degenerative changes throughout the lumbar spine with narrowed lumbar interspaces and associated endplate hypertrophic changes.  Lateral wedge deformities of T12, L1,  and L2 on the right consistent with scoliosis.  Diffuse bone demineralization.  No significant changes since the previous study.  Degenerative changes throughout the facet joints.  Postoperative changes in the left hip.  IMPRESSION: Diffuse bone demineralization with severe lumbar scoliosis and degenerative changes.  Stable appearance since previous study.  No acute changes are suggested.   Original Report Authenticated By: Burman Nieves, M.D.    Ct Head Wo Contrast  08/16/2012  *RADIOLOGY REPORT*  Clinical Data:  Fall earlier today.  Complaining of neck pain.  CT HEAD WITHOUT CONTRAST CT CERVICAL SPINE WITHOUT CONTRAST  Technique:  Multidetector CT imaging of the head and cervical spine was performed following the standard protocol without intravenous contrast.  Multiplanar CT image reconstructions of the cervical spine were also generated.  Comparison:  03/03/2012, 04/25/2011  CT HEAD  Findings: There is mild central cortical atrophy.  Periventricular white matter changes are consistent with small vessel disease and appears stable. There is no evidence for hemorrhage, mass lesion, or acute infarction.  Bone windows show paranasal sinuses to be well aerated.  No evidence for calvarial fracture.  IMPRESSION:  1.  Atrophy and small vessel disease. 2. No evidence for acute intracranial abnormality.  CT CERVICAL SPINE  Findings: There are moderate degenerative changes throughout the cervical spine.  No evidence for acute fracture.  There is anterolisthesis of C7 on T1, measuring 2 mm.  Given the presence of degenerative changes in this region, this is felt to be chronic. However, this does appear to be more advanced since the prior study in 2012.  IMPRESSION:  1.  Degenerative changes. 2. No evidence for acute fracture. 3.  Anterolisthesis of C7 on T1, felt to be degenerative but more advanced since previous exam.   Original Report Authenticated By: Norva Pavlov, M.D.    Ct Cervical Spine Wo  Contrast  08/16/2012  *RADIOLOGY REPORT*  Clinical Data:  Fall earlier today.  Complaining of neck pain.  CT HEAD WITHOUT CONTRAST CT CERVICAL SPINE WITHOUT CONTRAST  Technique:  Multidetector CT imaging of the head and cervical spine was performed following the standard protocol without intravenous contrast.  Multiplanar CT image reconstructions of the cervical spine were also generated.  Comparison:  03/03/2012, 04/25/2011  CT HEAD  Findings: There is mild central cortical atrophy.  Periventricular white matter changes are consistent with small vessel disease and appears stable. There is no evidence for hemorrhage, mass lesion, or acute infarction.  Bone windows show paranasal sinuses to be well aerated.  No evidence for calvarial fracture.  IMPRESSION:  1.  Atrophy and small vessel disease. 2. No evidence for acute intracranial abnormality.  CT CERVICAL SPINE  Findings: There are moderate degenerative changes throughout the cervical spine.  No evidence for acute fracture.  There is anterolisthesis of C7 on T1, measuring 2 mm.  Given the presence of degenerative changes in this region, this is felt to be chronic. However, this does appear to be more advanced since the prior study in 2012.  IMPRESSION:  1.  Degenerative changes. 2. No evidence for  acute fracture. 3.  Anterolisthesis of C7 on T1, felt to be degenerative but more advanced since previous exam.   Original Report Authenticated By: Norva Pavlov, M.D.      1. Fall at home, initial encounter       MDM  Ronnette Hila Garrabrant presents after fall. Pt without LOC, head injury, neck or back pain.  Patient without signs of serious head, neck, or back injury. Normal neurological exam. No concern for closed head injury, lung injury, or intraabdominal injury.  D/t pts largely baseline without acute changes in radiology pt will be dc home with symptomatic therapy. I personally reviewed the imaging tests through PACS system.  I reviewed available  ER/hospitalization records through the EMR.  Pt has been instructed to follow up with their doctor if symptoms persist. Home conservative therapies for pain including ice and heat tx have been discussed. Pt has taken oxycodone in the recent past without difficulty.  I will d/c with a small Rx for Vicodin and strict instructions for use.  Pt is hemodynamically stable, in NAD, & able to ambulate with walker and assistance in the ED. Pain has been managed & has no complaints prior to dc.   Dr. Chaney Malling was consulted, evaluated this patient with me and agrees with the plan.           Dahlia Client Nehan Flaum, PA-C 08/17/12 0101

## 2012-08-16 NOTE — ED Notes (Signed)
Per EMS - pt coming from [facility], pt c/o pain to left lateral pain that increases with palpation. Pt fell today after tripping over some shoes. Pt has 3+ pitting edema in legs and has an appointment on Monday for this issue, had swelling X couple years. Pt taking bld thinner, pt thinks she hit the back of her head. Pt sts she fell around 5pm today but didn't tell staff until later because she wanted to eat and knew they would send her to ED. BP 160/100 HR 68

## 2012-08-17 ENCOUNTER — Emergency Department (HOSPITAL_COMMUNITY): Payer: Medicare Other

## 2012-08-17 MED ORDER — HYDROCODONE-ACETAMINOPHEN 5-325 MG PO TABS: 1.0000 | ORAL_TABLET | ORAL | Status: DC | PRN

## 2012-08-17 NOTE — ED Notes (Signed)
Pt D/C by Sharin Mons to [facility].

## 2012-08-17 NOTE — ED Provider Notes (Signed)
Medical screening examination/treatment/procedure(s) were conducted as a shared visit with non-physician practitioner(s) and myself.  I personally evaluated the patient during the encounter  [name] is a 79 y.o. female here with s/p mechanical fall. CT head/neck unremarkable. Well appearing. Mild paralumbar tenderness and xray nl. She has oxycodone at home and has no neuro deficit.    Richardean Canal, MD 08/17/12 [phone]

## 2012-09-23 ENCOUNTER — Emergency Department (HOSPITAL_COMMUNITY): Payer: Medicare Other

## 2012-09-23 ENCOUNTER — Encounter (HOSPITAL_COMMUNITY): Payer: Self-pay | Admitting: Emergency Medicine

## 2012-09-23 ENCOUNTER — Emergency Department (HOSPITAL_COMMUNITY)
Admission: EM | Admit: 2012-09-23 | Discharge: 2012-09-23 | Disposition: A | Discharge status: Skilled Nursing Facility | Payer: Medicare Other | Attending: Emergency Medicine | Admitting: Emergency Medicine

## 2012-09-23 DIAGNOSIS — M4120 Other idiopathic scoliosis, site unspecified: Secondary | ICD-10-CM | POA: Diagnosis not present

## 2012-09-23 DIAGNOSIS — Z8781 Personal history of (healed) traumatic fracture: Secondary | ICD-10-CM | POA: Diagnosis not present

## 2012-09-23 DIAGNOSIS — C50919 Malignant neoplasm of unspecified site of unspecified female breast: Secondary | ICD-10-CM | POA: Diagnosis not present

## 2012-09-23 DIAGNOSIS — Z79899 Other long term (current) drug therapy: Secondary | ICD-10-CM | POA: Insufficient documentation

## 2012-09-23 DIAGNOSIS — Z7901 Long term (current) use of anticoagulants: Secondary | ICD-10-CM | POA: Insufficient documentation

## 2012-09-23 DIAGNOSIS — Y9229 Other specified public building as the place of occurrence of the external cause: Secondary | ICD-10-CM | POA: Insufficient documentation

## 2012-09-23 DIAGNOSIS — S0100XA Unspecified open wound of scalp, initial encounter: Secondary | ICD-10-CM | POA: Insufficient documentation

## 2012-09-23 DIAGNOSIS — I10 Essential (primary) hypertension: Secondary | ICD-10-CM | POA: Diagnosis not present

## 2012-09-23 DIAGNOSIS — F3289 Other specified depressive episodes: Secondary | ICD-10-CM | POA: Insufficient documentation

## 2012-09-23 DIAGNOSIS — F411 Generalized anxiety disorder: Secondary | ICD-10-CM | POA: Insufficient documentation

## 2012-09-23 DIAGNOSIS — F329 Major depressive disorder, single episode, unspecified: Secondary | ICD-10-CM | POA: Insufficient documentation

## 2012-09-23 DIAGNOSIS — S0990XA Unspecified injury of head, initial encounter: Secondary | ICD-10-CM

## 2012-09-23 DIAGNOSIS — Y9389 Activity, other specified: Secondary | ICD-10-CM | POA: Insufficient documentation

## 2012-09-23 DIAGNOSIS — W19XXXA Unspecified fall, initial encounter: Secondary | ICD-10-CM

## 2012-09-23 DIAGNOSIS — E079 Disorder of thyroid, unspecified: Secondary | ICD-10-CM | POA: Insufficient documentation

## 2012-09-23 DIAGNOSIS — W010XXA Fall on same level from slipping, tripping and stumbling without subsequent striking against object, initial encounter: Secondary | ICD-10-CM | POA: Insufficient documentation

## 2012-09-23 LAB — CBC WITH DIFFERENTIAL/PLATELET
Basophils Relative: 1 % (ref 0–1)
Eosinophils Absolute: 0.3 10*3/uL (ref 0.0–0.7)
Hemoglobin: 11.1 g/dL — ABNORMAL LOW (ref 12.0–15.0)
MCHC: 32.1 g/dL (ref 30.0–36.0)
Monocytes Relative: 17 % — ABNORMAL HIGH (ref 3–12)
Neutro Abs: 3.5 10*3/uL (ref 1.7–7.7)
Neutrophils Relative %: 55 % (ref 43–77)
Platelets: 270 10*3/uL (ref 150–400)
RBC: 4.51 MIL/uL (ref 3.87–5.11)

## 2012-09-23 LAB — PROTIME-INR
INR: 3.61 — ABNORMAL HIGH (ref 0.00–1.49)
Prothrombin Time: 33.9 seconds — ABNORMAL HIGH (ref 11.6–15.2)

## 2012-09-23 NOTE — ED Notes (Signed)
Pt here via ems s[place]p [place] while in [place] went to bathroom tripped and hit the back of head on the toilet bleeding controlled prior to arrival pt is on blood thinnerpt is from [facility]

## 2012-09-23 NOTE — ED Notes (Signed)
[facility] called to transport pt 

## 2012-09-23 NOTE — Discharge Instructions (Signed)
Head Injury, Adult  You have had a head injury that does not appear serious at this time. A concussion is a state of changed mental ability, usually from a blow to the head. You should take clear liquids for the rest of the day and then resume your regular diet. You should not take sedatives or alcoholic beverages for as long as directed by your caregiver after discharge. After injuries such as yours, most problems occur within the first 24 hours.  SYMPTOMS  These minor symptoms may be experienced after discharge:   Memory difficulties.   Dizziness.   Headaches.   Double vision.   Hearing difficulties.   Depression.   Tiredness.   Weakness.   Difficulty with concentration.  If you experience any of these problems, you should not be alarmed. A concussion requires a few days for recovery. Many patients with head injuries frequently experience such symptoms. Usually, these problems disappear without medical care. If symptoms last for more than one day, notify your caregiver. See your caregiver sooner if symptoms are becoming worse rather than better.  HOME CARE INSTRUCTIONS    During the next 24 hours you must stay with someone who can watch you for the warning signs listed below.  Although it is unlikely that serious side effects will occur, you should be aware of signs and symptoms which may necessitate your return to this location. Side effects may occur up to 7  10 days following the injury. It is important for you to carefully monitor your condition and contact your caregiver or seek immediate medical attention if there is a change in your condition.  SEEK IMMEDIATE MEDICAL CARE IF:    There is confusion or drowsiness.   You can not awaken the injured person.   There is nausea (feeling sick to your stomach) or continued, forceful vomiting.   You notice dizziness or unsteadiness which is getting worse, or inability to walk.   You have convulsions or unconsciousness.   You experience severe,  persistent headaches not relieved by over-the-counter or prescription medicines for pain. (Do not take aspirin as this impairs clotting abilities). Take other pain medications only as directed.   You can not use arms or legs normally.   There is clear or bloody discharge from the nose or ears.  MAKE SURE YOU:    Understand these instructions.   Will watch your condition.   Will get help right away if you are not doing well or get worse.  Document Released: 04/30/2005 Document Revised: 07/23/2011 Document Reviewed: 03/18/2009  [employer] Patient Information 2013 [employer]

## 2012-09-23 NOTE — ED Notes (Signed)
Pt requesting to leave at this time.

## 2012-09-23 NOTE — ED Provider Notes (Signed)
History    CSN: [id number] Arrival date & time 09/23/12  1623 First MD Initiated Contact with Patient 09/23/12 1632      Chief Complaint  Patient presents with  . Fall    HPI Pt was walking at church when tripped and stumbled hitting her head on a toilet.  Pt is on coumadin so she was sent to the ed for further evaluation.  No LOC. She did sustain a small lac in the back of her head.  NO active bleeding at this time but she was bleeding earlier.  No confusion.  Pt does not complain of any significant pain.  She states she feels a little sore.  She is able to walk without problems.  No weakness.   Past Medical History  Diagnosis Date  . Scoliosis   . Depression   . Breast cancer   . Hypertension   . Thyroid disease   . Anxiety     Past Surgical History  Procedure Laterality Date  . Hip fracture surgery      No family history on file.  History  Substance Use Topics  . Smoking status: Never Smoker   . Smokeless tobacco: Not on file  . Alcohol Use: No    OB History   Grav Para Term Preterm Abortions TAB SAB Ect Mult Living                  Review of Systems  All other systems reviewed and are negative.    Allergies  Sulfa antibiotics  Home Medications   Current Outpatient Rx  Name  Route  Sig  Dispense  Refill  . acetaminophen (TYLENOL) 325 MG tablet   Oral   Take 325 mg by mouth every 4 (four) hours as needed. For pain/headache/fever          . amiodarone (PACERONE) 200 MG tablet   Oral   Take 200 mg by mouth daily.           Marland Kitchen amLODipine (NORVASC) 10 MG tablet   Oral   Take 10 mg by mouth daily.           . baclofen (LIORESAL) 10 MG tablet   Oral   Take 10 mg by mouth 3 (three) times daily.           . benazepril (LOTENSIN) 20 MG tablet   Oral   Take 20 mg by mouth every morning.         . diazepam (VALIUM) 5 MG tablet   Oral   Take 5 mg by mouth every 6 (six) hours as needed. For anxiety          . docusate sodium (COLACE) 100  MG capsule   Oral   Take 100 mg by mouth daily. & as needed for constipation          . furosemide (LASIX) 80 MG tablet   Oral   Take 80 mg by mouth every morning.           . Gabapentin, PHN, (GRALISE) 600 MG TABS   Oral   Take 1 tablet by mouth daily with supper.           . levothyroxine (SYNTHROID, LEVOTHROID) 50 MCG tablet   Oral   Take 50 mcg by mouth daily.           Marland Kitchen lidocaine (LIDODERM) 5 %   Transdermal   Place 1-3 patches onto the skin daily. Remove & Discard patch within 12  hours or as directed by MD         . loperamide (IMODIUM) 2 MG capsule   Oral   Take 2 mg by mouth 4 (four) times daily as needed. For loose stools/diarrhea          . Multiple Vitamins-Minerals (MULTIVITAMINS THER. W/MINERALS) TABS   Oral   Take 1 tablet by mouth daily.           . Oxycodone HCl 10 MG TABS   Oral   Take 10 mg by mouth every 4 (four) hours as needed. pain         . polyethylene glycol (MIRALAX / GLYCOLAX) packet   Oral   Take 17 g by mouth daily as needed. For constipation.          . potassium chloride SA (K-DUR,KLOR-CON) 20 MEQ tablet   Oral   Take 20 mEq by mouth daily.           . tamoxifen (NOLVADEX) 10 MG tablet   Oral   Take 10 mg by mouth daily.           Marland Kitchen tolnaftate (TINACTIN) 1 % powder   Topical   Apply 1 application topically 3 (three) times daily as needed.           . venlafaxine (EFFEXOR-XR) 150 MG 24 hr capsule   Oral   Take 150 mg by mouth daily.           Marland Kitchen warfarin (COUMADIN) 2.5 MG tablet   Oral   Take 2.5 mg by mouth daily.            BP 134/58  Pulse 54  Temp(Src) 98.5 F (36.9 C) (Oral)  Resp 15  SpO2 91%  Physical Exam  Nursing note and vitals reviewed. Constitutional: She appears well-developed and well-nourished. No distress.  HENT:  Head: Normocephalic and atraumatic.  Right Ear: External ear normal.  Left Ear: External ear normal.  Small, < 1/2 cm lac posterior occiput, no active bleeding   Eyes: Conjunctivae are normal. Right eye exhibits no discharge. Left eye exhibits no discharge. No scleral icterus.  Neck: Neck supple. No tracheal deviation present.  Cardiovascular: Normal rate, regular rhythm and intact distal pulses.   Pulmonary/Chest: Effort normal and breath sounds normal. No stridor. No respiratory distress. She has no wheezes. She has no rales.  Abdominal: Soft. Bowel sounds are normal. She exhibits no distension. There is no tenderness. There is no rebound and no guarding.  Musculoskeletal: She exhibits no edema and no tenderness.  Neurological: She is alert. She has normal strength. No sensory deficit. Cranial nerve deficit:  no gross defecits noted. She exhibits normal muscle tone. She displays no seizure activity. Coordination normal.  Skin: Skin is warm and dry. No rash noted.  Psychiatric: She has a normal mood and affect.    ED Course  Procedures (including critical care time)  Labs Reviewed  CBC WITH DIFFERENTIAL - Abnormal; Notable for the following:    Hemoglobin 11.1 (*)    HCT 34.6 (*)    MCV 76.7 (*)    MCH 24.6 (*)    RDW 17.3 (*)    Monocytes Relative 17 (*)    Monocytes Absolute 1.1 (*)    All other components within normal limits  PROTIME-INR - Abnormal; Notable for the following:    Prothrombin Time 33.9 (*)    INR 3.61 (*)    All other components within normal limits   Ct Head Wo  Contrast  09/23/2012  *RADIOLOGY REPORT*  Clinical Data: Fall.  Hit back of head.  CT HEAD WITHOUT CONTRAST  Technique:  Contiguous axial images were obtained from the base of the skull through the vertex without contrast.  Comparison: 08/16/2012  Findings: There is atrophy and chronic small vessel disease changes. No acute intracranial abnormality.  Specifically, no hemorrhage, hydrocephalus, mass lesion, acute infarction, or significant intracranial injury.  No acute calvarial abnormality. Visualized paranasal sinuses and mastoids clear.  Orbital soft tissues  unremarkable.  IMPRESSION: No acute intracranial abnormality.  Atrophy, chronic microvascular disease.   Original Report Authenticated By: Charlett Nose, M.D.      1. Fall, initial encounter   2. Minor head injury, initial encounter       MDM  The patient does not have any evidence of serious injury. She is in no distress and is asymptomatic. Patient is anxious to go home        Celene Kras, MD 09/23/12 [phone]

## 2012-10-27 DIAGNOSIS — M79609 Pain in unspecified limb: Secondary | ICD-10-CM | POA: Diagnosis not present

## 2012-10-27 DIAGNOSIS — I739 Peripheral vascular disease, unspecified: Secondary | ICD-10-CM | POA: Diagnosis not present

## 2012-10-27 DIAGNOSIS — B351 Tinea unguium: Secondary | ICD-10-CM | POA: Diagnosis not present

## 2012-10-27 DIAGNOSIS — L84 Corns and callosities: Secondary | ICD-10-CM | POA: Diagnosis not present

## 2012-11-16 ENCOUNTER — Encounter (HOSPITAL_COMMUNITY): Payer: Self-pay | Admitting: *Deleted

## 2012-11-16 ENCOUNTER — Emergency Department (HOSPITAL_COMMUNITY): Payer: Medicare Other

## 2012-11-16 ENCOUNTER — Emergency Department (HOSPITAL_COMMUNITY)
Admission: EM | Admit: 2012-11-16 | Discharge: 2012-11-17 | Disposition: A | Discharge status: Home / Self Care | Payer: Medicare Other | Attending: Emergency Medicine | Admitting: Emergency Medicine

## 2012-11-16 DIAGNOSIS — E079 Disorder of thyroid, unspecified: Secondary | ICD-10-CM | POA: Diagnosis not present

## 2012-11-16 DIAGNOSIS — Y939 Activity, unspecified: Secondary | ICD-10-CM | POA: Insufficient documentation

## 2012-11-16 DIAGNOSIS — I10 Essential (primary) hypertension: Secondary | ICD-10-CM | POA: Diagnosis not present

## 2012-11-16 DIAGNOSIS — F329 Major depressive disorder, single episode, unspecified: Secondary | ICD-10-CM | POA: Insufficient documentation

## 2012-11-16 DIAGNOSIS — Z8739 Personal history of other diseases of the musculoskeletal system and connective tissue: Secondary | ICD-10-CM | POA: Insufficient documentation

## 2012-11-16 DIAGNOSIS — Z853 Personal history of malignant neoplasm of breast: Secondary | ICD-10-CM | POA: Diagnosis not present

## 2012-11-16 DIAGNOSIS — F411 Generalized anxiety disorder: Secondary | ICD-10-CM | POA: Insufficient documentation

## 2012-11-16 DIAGNOSIS — S0990XA Unspecified injury of head, initial encounter: Secondary | ICD-10-CM

## 2012-11-16 DIAGNOSIS — F3289 Other specified depressive episodes: Secondary | ICD-10-CM | POA: Insufficient documentation

## 2012-11-16 DIAGNOSIS — Z7901 Long term (current) use of anticoagulants: Secondary | ICD-10-CM | POA: Diagnosis not present

## 2012-11-16 DIAGNOSIS — Z79899 Other long term (current) drug therapy: Secondary | ICD-10-CM | POA: Diagnosis not present

## 2012-11-16 DIAGNOSIS — IMO0002 Reserved for concepts with insufficient information to code with codable children: Secondary | ICD-10-CM | POA: Diagnosis not present

## 2012-11-16 DIAGNOSIS — W1809XA Striking against other object with subsequent fall, initial encounter: Secondary | ICD-10-CM | POA: Insufficient documentation

## 2012-11-16 DIAGNOSIS — Y9210 Unspecified residential institution as the place of occurrence of the external cause: Secondary | ICD-10-CM | POA: Insufficient documentation

## 2012-11-16 LAB — PROTIME-INR: Prothrombin Time: 28.6 seconds — ABNORMAL HIGH (ref 11.6–15.2)

## 2012-11-16 NOTE — ED Notes (Signed)
[place] MD at bedside. 

## 2012-11-16 NOTE — ED Notes (Signed)
Pt arrived via [facility] from [facility] living, c/o fall with bruising to rt frontal temple from hit head on dresser. She currently taking coumadin

## 2012-11-16 NOTE — ED Notes (Signed)
Pt assisted onto bedpan. Sheets changed and assisted to get dressed. Pt tolerated well. Pt moved to room to wait for [facility]. Pt aware of poc.

## 2012-11-16 NOTE — ED Provider Notes (Signed)
History    CSN: [id number] Arrival date & time 11/16/12  2003  First MD Initiated Contact with Patient 11/16/12 2012     Chief Complaint  Patient presents with  . Fall   (Consider location/radiation/quality/duration/timing/severity/associated sxs/prior Treatment) HPI Patient presents after a fall with pain in her for her.  She states that she lost her mouth, fell striking her head against the floor. She denies loss of consciousness, subsequent confusion, disorientation, additional falls, nausea, vomiting, incontinence or any new weakness anywhere. The pain is sore, nonradiating, not improved by changed by anything in particular.  Past Medical History  Diagnosis Date  . Scoliosis   . Depression   . Breast cancer   . Hypertension   . Thyroid disease   . Anxiety    Past Surgical History  Procedure Laterality Date  . Hip fracture surgery     No family history on file. History  Substance Use Topics  . Smoking status: Never Smoker   . Smokeless tobacco: Not on file  . Alcohol Use: No   OB History   Grav Para Term Preterm Abortions TAB SAB Ect Mult Living                 Review of Systems  All other systems reviewed and are negative.    Allergies  Sulfa antibiotics  Home Medications   Current Outpatient Rx  Name  Route  Sig  Dispense  Refill  . acetaminophen (TYLENOL) 325 MG tablet   Oral   Take 325 mg by mouth every 4 (four) hours as needed. For pain/headache/fever          . amiodarone (PACERONE) 200 MG tablet   Oral   Take 200 mg by mouth daily.           Marland Kitchen amLODipine (NORVASC) 10 MG tablet   Oral   Take 10 mg by mouth daily.           . baclofen (LIORESAL) 10 MG tablet   Oral   Take 10 mg by mouth 3 (three) times daily.           . benazepril (LOTENSIN) 20 MG tablet   Oral   Take 20 mg by mouth every morning.         . diazepam (VALIUM) 5 MG tablet   Oral   Take 5 mg by mouth every 6 (six) hours as needed. For anxiety          .  docusate sodium (COLACE) 100 MG capsule   Oral   Take 100 mg by mouth daily. & as needed for constipation          . furosemide (LASIX) 80 MG tablet   Oral   Take 80 mg by mouth every morning.           . Gabapentin, PHN, (GRALISE) 600 MG TABS   Oral   Take 1 tablet by mouth daily with supper.           . levothyroxine (SYNTHROID, LEVOTHROID) 50 MCG tablet   Oral   Take 50 mcg by mouth daily.           Marland Kitchen lidocaine (LIDODERM) 5 %   Transdermal   Place 1-3 patches onto the skin daily. Remove & Discard patch within 12 hours or as directed by MD         . loperamide (IMODIUM) 2 MG capsule   Oral   Take 2 mg by mouth 4 (four) times  daily as needed. For loose stools/diarrhea          . Multiple Vitamins-Minerals (MULTIVITAMINS THER. W/MINERALS) TABS   Oral   Take 1 tablet by mouth daily.           . Oxycodone HCl 10 MG TABS   Oral   Take 10 mg by mouth every 4 (four) hours as needed. pain         . polyethylene glycol (MIRALAX / GLYCOLAX) packet   Oral   Take 17 g by mouth daily as needed. For constipation.          . potassium chloride SA (K-DUR,KLOR-CON) 20 MEQ tablet   Oral   Take 20 mEq by mouth daily.           . prednisoLONE acetate (PRED FORTE) 1 % ophthalmic suspension   Left Eye   Place 1 drop into the left eye 4 (four) times daily.         . tamoxifen (NOLVADEX) 10 MG tablet   Oral   Take 10 mg by mouth daily.           Marland Kitchen tolnaftate (TINACTIN) 1 % powder   Topical   Apply 1 application topically 3 (three) times daily as needed for itching.          . venlafaxine (EFFEXOR-XR) 150 MG 24 hr capsule   Oral   Take 150 mg by mouth daily.           Marland Kitchen warfarin (COUMADIN) 2.5 MG tablet   Oral   Take 2.5 mg by mouth daily.           BP 120/48  Pulse 52  Temp(Src) 98.5 F (36.9 C) (Oral)  Resp 18  SpO2 95% Physical Exam  Nursing note and vitals reviewed. Constitutional: She appears well-developed and well-nourished. No  distress.  HENT:  Head: Normocephalic and atraumatic.  Right Ear: External ear normal.  Left Ear: External ear normal.  There is a hematoma on the patient's forehead approximately 4 cm in diameter  Eyes: Conjunctivae are normal. Right eye exhibits no discharge. Left eye exhibits no discharge. No scleral icterus.  Neck: Neck supple. No tracheal deviation present.  Cardiovascular: Normal rate, regular rhythm and intact distal pulses.   Pulmonary/Chest: Effort normal and breath sounds normal. No stridor. No respiratory distress. She has no wheezes.  Abdominal: Soft. Bowel sounds are normal. She exhibits no distension. There is no tenderness. There is no rebound and no guarding.  Musculoskeletal: She exhibits no edema and no tenderness.  Neurological: She is alert. She has normal strength. No cranial nerve deficit ( no gross defecits noted) or sensory deficit. She exhibits normal muscle tone. She displays no seizure activity. Coordination normal.  Skin: Skin is warm and dry. No rash noted.  Psychiatric: She has a normal mood and affect.    ED Course  Procedures (including critical care time) Labs Reviewed  PROTIME-INR   No results found. No diagnosis found.  Pulse ox 99% remainder normal the  MDM  Patient presents after a fall with pain in her forefoot.  Given the patient's use of Coumadin, CT scans performed.  This was reassuring.  The patient is therapeutically anticoagulated.  She is a resident of a nursing facility, and although there is low suspicion for occult bleed, her discharge to a monitored facility seems appropriate.  Gerhard Munch, MD 11/16/12 [phone]

## 2012-11-16 NOTE — ED Notes (Signed)
[facility] called for transportation  

## 2012-11-16 NOTE — ED Notes (Signed)
Pt denies any questions upon discharge, reports chronic pain and denies any new pain at this time.

## 2012-12-18 ENCOUNTER — Other Ambulatory Visit: Payer: Self-pay | Admitting: Family Medicine

## 2012-12-18 DIAGNOSIS — Z853 Personal history of malignant neoplasm of breast: Secondary | ICD-10-CM

## 2013-01-02 ENCOUNTER — Ambulatory Visit
Admission: RE | Admit: 2013-01-02 | Discharge: 2013-01-02 | Disposition: A | Discharge status: Home / Self Care | Payer: Medicare Other | Source: Ambulatory Visit | Attending: Family Medicine | Admitting: Family Medicine

## 2013-01-02 DIAGNOSIS — Z853 Personal history of malignant neoplasm of breast: Secondary | ICD-10-CM

## 2013-01-30 DIAGNOSIS — I739 Peripheral vascular disease, unspecified: Secondary | ICD-10-CM | POA: Diagnosis not present

## 2013-01-30 DIAGNOSIS — L84 Corns and callosities: Secondary | ICD-10-CM | POA: Diagnosis not present

## 2013-01-30 DIAGNOSIS — L608 Other nail disorders: Secondary | ICD-10-CM | POA: Diagnosis not present

## 2013-02-17 ENCOUNTER — Ambulatory Visit
Admission: RE | Admit: 2013-02-17 | Discharge: 2013-02-17 | Disposition: A | Discharge status: Home / Self Care | Payer: Medicare Other | Source: Ambulatory Visit | Attending: Family Medicine | Admitting: Family Medicine

## 2013-02-17 ENCOUNTER — Other Ambulatory Visit: Payer: Self-pay | Admitting: Family Medicine

## 2013-02-17 DIAGNOSIS — M545 Low back pain: Secondary | ICD-10-CM

## 2013-02-25 ENCOUNTER — Emergency Department (HOSPITAL_COMMUNITY): Payer: Medicare Other

## 2013-02-25 ENCOUNTER — Emergency Department (HOSPITAL_COMMUNITY)
Admission: EM | Admit: 2013-02-25 | Discharge: 2013-02-26 | Disposition: A | Discharge status: Home / Self Care | Payer: Medicare Other | Attending: Emergency Medicine | Admitting: Emergency Medicine

## 2013-02-25 ENCOUNTER — Encounter (HOSPITAL_COMMUNITY): Payer: Self-pay | Admitting: Emergency Medicine

## 2013-02-25 DIAGNOSIS — Z79899 Other long term (current) drug therapy: Secondary | ICD-10-CM | POA: Diagnosis not present

## 2013-02-25 DIAGNOSIS — S4980XA Other specified injuries of shoulder and upper arm, unspecified arm, initial encounter: Secondary | ICD-10-CM | POA: Insufficient documentation

## 2013-02-25 DIAGNOSIS — M25522 Pain in left elbow: Secondary | ICD-10-CM

## 2013-02-25 DIAGNOSIS — F411 Generalized anxiety disorder: Secondary | ICD-10-CM | POA: Insufficient documentation

## 2013-02-25 DIAGNOSIS — IMO0002 Reserved for concepts with insufficient information to code with codable children: Secondary | ICD-10-CM | POA: Insufficient documentation

## 2013-02-25 DIAGNOSIS — Z853 Personal history of malignant neoplasm of breast: Secondary | ICD-10-CM | POA: Diagnosis not present

## 2013-02-25 DIAGNOSIS — S59909A Unspecified injury of unspecified elbow, initial encounter: Secondary | ICD-10-CM | POA: Insufficient documentation

## 2013-02-25 DIAGNOSIS — S6990XA Unspecified injury of unspecified wrist, hand and finger(s), initial encounter: Secondary | ICD-10-CM | POA: Diagnosis not present

## 2013-02-25 DIAGNOSIS — G8929 Other chronic pain: Secondary | ICD-10-CM | POA: Diagnosis not present

## 2013-02-25 DIAGNOSIS — Y939 Activity, unspecified: Secondary | ICD-10-CM | POA: Insufficient documentation

## 2013-02-25 DIAGNOSIS — Y92009 Unspecified place in unspecified non-institutional (private) residence as the place of occurrence of the external cause: Secondary | ICD-10-CM | POA: Insufficient documentation

## 2013-02-25 DIAGNOSIS — E079 Disorder of thyroid, unspecified: Secondary | ICD-10-CM | POA: Insufficient documentation

## 2013-02-25 DIAGNOSIS — Z8739 Personal history of other diseases of the musculoskeletal system and connective tissue: Secondary | ICD-10-CM | POA: Diagnosis not present

## 2013-02-25 DIAGNOSIS — R609 Edema, unspecified: Secondary | ICD-10-CM | POA: Insufficient documentation

## 2013-02-25 DIAGNOSIS — F329 Major depressive disorder, single episode, unspecified: Secondary | ICD-10-CM | POA: Diagnosis not present

## 2013-02-25 DIAGNOSIS — S46909A Unspecified injury of unspecified muscle, fascia and tendon at shoulder and upper arm level, unspecified arm, initial encounter: Secondary | ICD-10-CM | POA: Insufficient documentation

## 2013-02-25 DIAGNOSIS — W19XXXA Unspecified fall, initial encounter: Secondary | ICD-10-CM | POA: Insufficient documentation

## 2013-02-25 DIAGNOSIS — Z7901 Long term (current) use of anticoagulants: Secondary | ICD-10-CM | POA: Insufficient documentation

## 2013-02-25 DIAGNOSIS — I10 Essential (primary) hypertension: Secondary | ICD-10-CM | POA: Diagnosis not present

## 2013-02-25 DIAGNOSIS — F3289 Other specified depressive episodes: Secondary | ICD-10-CM | POA: Insufficient documentation

## 2013-02-25 DIAGNOSIS — M79609 Pain in unspecified limb: Secondary | ICD-10-CM | POA: Diagnosis not present

## 2013-02-25 LAB — CBC WITH DIFFERENTIAL/PLATELET
Basophils Absolute: 0 10*3/uL (ref 0.0–0.1)
Basophils Relative: 0 % (ref 0–1)
Eosinophils Absolute: 0.2 10*3/uL (ref 0.0–0.7)
Eosinophils Relative: 3 % (ref 0–5)
Lymphs Abs: 1.3 10*3/uL (ref 0.7–4.0)
MCH: 26.4 pg (ref 26.0–34.0)
MCHC: 31.9 g/dL (ref 30.0–36.0)
MCV: 82.8 fL (ref 78.0–100.0)
Neutrophils Relative %: 63 % (ref 43–77)
Platelets: 281 10*3/uL (ref 150–400)
RBC: 4.89 MIL/uL (ref 3.87–5.11)
RDW: 15.5 % (ref 11.5–15.5)

## 2013-02-25 LAB — PROTIME-INR
INR: 3.98 — ABNORMAL HIGH (ref 0.00–1.49)
Prothrombin Time: 37.3 seconds — ABNORMAL HIGH (ref 11.6–15.2)

## 2013-02-25 LAB — COMPREHENSIVE METABOLIC PANEL
ALT: 16 U/L (ref 0–35)
AST: 19 U/L (ref 0–37)
Alkaline Phosphatase: 76 U/L (ref 39–117)
BUN: 12 mg/dL (ref 6–23)
CO2: 30 mEq/L (ref 19–32)
Calcium: 9 mg/dL (ref 8.4–10.5)
GFR calc Af Amer: >90 mL/min (ref >90)
GFR calc non Af Amer: 82 mL/min — ABNORMAL LOW (ref >90)
Glucose, Bld: 108 mg/dL — ABNORMAL HIGH (ref 70–99)
Potassium: 3.8 mEq/L (ref 3.5–5.1)
Sodium: 135 mEq/L (ref 135–145)

## 2013-02-25 MED ORDER — HYDROMORPHONE HCL PF 1 MG/ML IJ SOLN: 1.0000 mg | Freq: Once | INTRAMUSCULAR | Status: DC

## 2013-02-25 MED ORDER — OXYCODONE-ACETAMINOPHEN 5-325 MG PO TABS
2.0000 | ORAL_TABLET | Freq: Once | ORAL | Status: AC
  Administered 2013-02-26: 2 via ORAL
  Filled 2013-02-25: qty 2

## 2013-02-25 MED ORDER — FENTANYL CITRATE 0.05 MG/ML IJ SOLN
50.0000 ug | Freq: Once | INTRAMUSCULAR | Status: AC
  Administered 2013-02-25: 50 ug via INTRAVENOUS
  Filled 2013-02-25: qty 2

## 2013-02-25 MED ORDER — ONDANSETRON HCL 4 MG/2ML IJ SOLN
4.0000 mg | Freq: Once | INTRAMUSCULAR | Status: DC
  Filled 2013-02-25: qty 2

## 2013-02-25 NOTE — ED Notes (Signed)
Patient transported to radiology

## 2013-02-25 NOTE — ED Notes (Signed)
Pt still in radiology.

## 2013-02-25 NOTE — ED Notes (Signed)
Pt d/c on hold.  RA sats fluctuating.  Pt found at 75% with a rise to upper 80's.  Holding 96-98% with 2l per [place].  PA and MD aware.  Delay d/c

## 2013-02-25 NOTE — ED Notes (Signed)
Bed: WA21 Expected date:  Expected time:  Means of arrival:  Comments: Shoulder pain

## 2013-02-25 NOTE — ED Notes (Signed)
Pt brought in by [facility] from [facility] c/o left arm pain. Pt fell last night and didn't have pain until she woke up this morning. Pt is on warfarin daily.  Pt is taking antibiotic for infection in legs, but pt hasnt been taking due to causing her diarrhea.

## 2013-02-25 NOTE — ED Provider Notes (Signed)
CSN: [id number]     Arrival date & time 02/25/13  1749 History   First MD Initiated Contact with Patient 02/25/13 1755     Chief Complaint  Patient presents with  . Fall  . Arm Pain    left   (Consider location/radiation/quality/duration/timing/severity/associated sxs/prior Treatment) HPI Comments: Patient is an 79 year old female with history of scoliosis, depression, breast cancer, hypertension, thyroid disease, anxiety who presents today after a fall last night. She reports she does not know why she fell and does not remember the fall. She has severe left shoulder pain, left elbow pain, left hand pain. She put her left arm in a makeshift sling with a scarf which has helped her pain mildly. She takes oxycodone daily for chronic pain issues. She denies nausea, vomiting, abdominal pain. She was on an antibiotic because her legs are weeping, but this has resolved. She has history of bilateral edema. She is on Coumadin. She denies fevers, chills, shortness of breath, chest pain.   Patient is a 79 y.o. female presenting with fall and arm pain. The history is provided by the patient. No language interpreter was used.  Fall Associated symptoms include arthralgias, joint swelling and myalgias. Pertinent negatives include no abdominal pain, chest pain, chills, fever, nausea or vomiting.  Arm Pain Associated symptoms include arthralgias, joint swelling and myalgias. Pertinent negatives include no abdominal pain, chest pain, chills, fever, nausea or vomiting.    Past Medical History  Diagnosis Date  . Scoliosis   . Depression   . Breast cancer   . Hypertension   . Thyroid disease   . Anxiety    Past Surgical History  Procedure Laterality Date  . Hip fracture surgery    . Fracture surgery     No family history on file. History  Substance Use Topics  . Smoking status: Never Smoker   . Smokeless tobacco: Not on file  . Alcohol Use: No   OB History   Grav Para Term Preterm Abortions TAB  SAB Ect Mult Living                 Review of Systems  Constitutional: Negative for fever and chills.  Respiratory: Negative for shortness of breath.   Cardiovascular: Negative for chest pain.  Gastrointestinal: Negative for nausea, vomiting and abdominal pain.  Musculoskeletal: Positive for arthralgias, back pain, joint swelling and myalgias.  All other systems reviewed and are negative.    Allergies  Sulfa antibiotics  Home Medications   Current Outpatient Rx  Name  Route  Sig  Dispense  Refill  . acetaminophen (TYLENOL) 325 MG tablet   Oral   Take 325 mg by mouth every 4 (four) hours as needed. For pain/headache/fever          . amiodarone (PACERONE) 200 MG tablet   Oral   Take 200 mg by mouth daily.           Marland Kitchen amLODipine (NORVASC) 10 MG tablet   Oral   Take 10 mg by mouth daily.           . baclofen (LIORESAL) 10 MG tablet   Oral   Take 10 mg by mouth 3 (three) times daily.           . benazepril (LOTENSIN) 20 MG tablet   Oral   Take 20 mg by mouth every morning.         . diazepam (VALIUM) 5 MG tablet   Oral   Take 5 mg by mouth  every 6 (six) hours as needed. For anxiety          . docusate sodium (COLACE) 100 MG capsule   Oral   Take 100 mg by mouth daily. & as needed for constipation          . furosemide (LASIX) 80 MG tablet   Oral   Take 80 mg by mouth every morning.           . Gabapentin, PHN, (GRALISE) 600 MG TABS   Oral   Take 1 tablet by mouth daily with supper.           . levothyroxine (SYNTHROID, LEVOTHROID) 50 MCG tablet   Oral   Take 50 mcg by mouth daily.           Marland Kitchen lidocaine (LIDODERM) 5 %   Transdermal   Place 1-3 patches onto the skin daily. Remove & Discard patch within 12 hours or as directed by MD         . loperamide (IMODIUM) 2 MG capsule   Oral   Take 2 mg by mouth 4 (four) times daily as needed. For loose stools/diarrhea          . Multiple Vitamins-Minerals (MULTIVITAMINS THER. W/MINERALS)  TABS   Oral   Take 1 tablet by mouth daily.           . Oxycodone HCl 10 MG TABS   Oral   Take 10 mg by mouth every 4 (four) hours as needed. pain         . polyethylene glycol (MIRALAX / GLYCOLAX) packet   Oral   Take 17 g by mouth daily as needed. For constipation.          . potassium chloride SA (K-DUR,KLOR-CON) 20 MEQ tablet   Oral   Take 20 mEq by mouth daily.           . prednisoLONE acetate (PRED FORTE) 1 % ophthalmic suspension   Left Eye   Place 1 drop into the left eye 4 (four) times daily.         . tamoxifen (NOLVADEX) 10 MG tablet   Oral   Take 10 mg by mouth daily.           Marland Kitchen tolnaftate (TINACTIN) 1 % powder   Topical   Apply 1 application topically 3 (three) times daily as needed for itching.          . venlafaxine (EFFEXOR-XR) 150 MG 24 hr capsule   Oral   Take 150 mg by mouth daily.           Marland Kitchen warfarin (COUMADIN) 2.5 MG tablet   Oral   Take 2.5 mg by mouth daily.           BP 144/56  Pulse 57  Temp(Src) 98.5 F (36.9 C) (Oral)  Resp 17  SpO2 93% Physical Exam  Nursing note and vitals reviewed. Constitutional: She is oriented to person, place, and time. She appears well-developed and well-nourished. No distress.  HENT:  Head: Normocephalic and atraumatic.  Right Ear: External ear normal.  Left Ear: External ear normal.  Nose: Nose normal.  Mouth/Throat: Oropharynx is clear and moist.  Eyes: Conjunctivae are normal.  Neck: Normal range of motion.  Cardiovascular: Normal rate, regular rhythm and normal heart sounds.   Significant edema on lower extremities bilaterally. There is surrounding erythema diffusely over the bilateral lower leg.  Pulmonary/Chest: Effort normal and breath sounds normal. No stridor. No respiratory distress. She  has no wheezes. She has no rales.  Abdominal: Soft. She exhibits no distension.  Musculoskeletal: Normal range of motion.  Tender to palpation diffusely of her left shoulder, left elbow, left  hand. Contusion over left distal thumb.  She is neurovascularly intact. Compartment soft. Strength 5 out of 5.  Neurological: She is alert and oriented to person, place, and time. She has normal strength.  Skin: Skin is warm and dry. She is not diaphoretic. No erythema.  Silver nail polish on fingernails  Psychiatric: She has a normal mood and affect. Her behavior is normal.    ED Course  Procedures (including critical care time) Labs Review Labs Reviewed  CBC WITH DIFFERENTIAL - Abnormal; Notable for the following:    Monocytes Relative 16 (*)    Monocytes Absolute 1.2 (*)    All other components within normal limits  PROTIME-INR - Abnormal; Notable for the following:    Prothrombin Time 37.3 (*)    INR 3.98 (*)    All other components within normal limits  COMPREHENSIVE METABOLIC PANEL - Abnormal; Notable for the following:    Glucose, Bld 108 (*)    GFR calc non Af Amer 82 (*)    All other components within normal limits   Imaging Review Dg Chest 2 View  02/25/2013   CLINICAL DATA:  Weakness and shortness of breath  EXAM: CHEST  2 VIEW  COMPARISON:  August 16, 2012  FINDINGS: There is no edema or consolidation. The heart is mildly enlarged with normal pulmonary vascularity. No adenopathy. There is a hiatal hernia present.  IMPRESSION: Mild cardiac enlargement. Hiatal hernia present. No edema or consolidation.   Electronically Signed   By: Bretta Bang M.D.   On: 02/25/2013 21:20   Dg Elbow Complete Left  02/25/2013   CLINICAL DATA:  Left elbow pain, fell 1 day ago  EXAM: LEFT ELBOW - COMPLETE 3+ VIEW  COMPARISON:  None  FINDINGS: Mild osseous demineralization.  Joint spaces preserved.  No acute fracture, dislocation or bone destruction.  No elbow joint effusion.  IMPRESSION: Normal exam.   Electronically Signed   By: Ulyses Southward M.D.   On: 02/25/2013 18:46   Ct Head Wo Contrast  02/25/2013   CLINICAL DATA:  Fall. Head pain. Neck pain.  EXAM: CT HEAD WITHOUT CONTRAST  CT  CERVICAL SPINE WITHOUT CONTRAST  TECHNIQUE: Multidetector CT imaging of the head and cervical spine was performed following the standard protocol without intravenous contrast. Multiplanar CT image reconstructions of the cervical spine were also generated.  COMPARISON:  11/16/2012.  FINDINGS: CT HEAD FINDINGS  No evidence for acute infarction, hemorrhage, mass lesion, hydrocephalus, or extra-axial fluid. Moderate cerebral and cerebellar atrophy. Chronic microvascular ischemic change of a moderate degree affects the periventricular and subcortical white matter. There is no skull fracture. Sinuses and mastoids are clear. Moderate vascular calcification of the proximal intracranial vessels is evident. Similar appearance to priors.  CT CERVICAL SPINE FINDINGS  There is no visible cervical spine fracture, traumatic subluxation, prevertebral soft tissue swelling, or intraspinal hematoma. Advanced spondylosis is present most notably from C5 through T1. Facet mediated slip C7-T1. Mild pannus. No pneumothorax. No neck masses. Airway midline. Widespread facet arthropathy.  IMPRESSION: No acute intracranial abnormality. Stable atrophy and small vessel disease.  Spondylosis without cervical spine fracture or traumatic subluxation.   Electronically Signed   By: Davonna Belling M.D.   On: 02/25/2013 18:36   Ct Cervical Spine Wo Contrast  02/25/2013   CLINICAL DATA:  Fall.  Head pain. Neck pain.  EXAM: CT HEAD WITHOUT CONTRAST  CT CERVICAL SPINE WITHOUT CONTRAST  TECHNIQUE: Multidetector CT imaging of the head and cervical spine was performed following the standard protocol without intravenous contrast. Multiplanar CT image reconstructions of the cervical spine were also generated.  COMPARISON:  11/16/2012.  FINDINGS: CT HEAD FINDINGS  No evidence for acute infarction, hemorrhage, mass lesion, hydrocephalus, or extra-axial fluid. Moderate cerebral and cerebellar atrophy. Chronic microvascular ischemic change of a moderate degree  affects the periventricular and subcortical white matter. There is no skull fracture. Sinuses and mastoids are clear. Moderate vascular calcification of the proximal intracranial vessels is evident. Similar appearance to priors.  CT CERVICAL SPINE FINDINGS  There is no visible cervical spine fracture, traumatic subluxation, prevertebral soft tissue swelling, or intraspinal hematoma. Advanced spondylosis is present most notably from C5 through T1. Facet mediated slip C7-T1. Mild pannus. No pneumothorax. No neck masses. Airway midline. Widespread facet arthropathy.  IMPRESSION: No acute intracranial abnormality. Stable atrophy and small vessel disease.  Spondylosis without cervical spine fracture or traumatic subluxation.   Electronically Signed   By: Davonna Belling M.D.   On: 02/25/2013 18:36   Dg Shoulder Left  02/25/2013   CLINICAL DATA:  Left shoulder pain, fell 1 day ago  EXAM: LEFT SHOULDER - 2+ VIEW  COMPARISON:  None  FINDINGS: AC joint alignment normal.  Question mild osseous demineralization.  Inferior acromial spur formation noted.  No acute fracture, dislocation, or bone destruction.  Visualized portions of the left ribs appear intact.  IMPRESSION: No acute osseous abnormalities.  Inferior acromial spur formation, which may predispose the patient to rotator cuff pathology.   Electronically Signed   By: Ulyses Southward M.D.   On: 02/25/2013 18:47   Dg Hand Complete Left  02/25/2013   CLINICAL DATA:  Left hand pain, fell 1 day ago  EXAM: LEFT HAND - COMPLETE 3+ VIEW  COMPARISON:  04/21/2012  FINDINGS: Mild osseous demineralization.  Scattered narrowing of interphalangeal joints with spur formation.  Minimal degenerative changes also present at the 1st and 2nd MCP joints.  Fingers partially superimposed on lateral view limiting assessment.  No acute fracture, dislocation, or bone destruction.  IMPRESSION: Scattered degenerative changes of primarily interphalangeal joints.  No acute abnormalities.    Electronically Signed   By: Ulyses Southward M.D.   On: 02/25/2013 18:45    EKG Interpretation   None      11:44 PM Discussed with patient that we would like to admit her to the hospital overnight for her hypoxia. She adamantly refuses saying she is always hypoxic and the nurses at [facility] wouldn't know. She also states that she doesn't care if she dies because she has no money and her son's haven't visited her in years. She is a DNR. She feels very strongly that she wants to go back to [facility]. Discussed that she is more than welcome to return to the emergency department at any time if she changes her mind. Return with any worsening of her symptoms.   Dr. Conley Rolls later evaluated patient and does not believe she needs admission as her hypoxia is likely chronic in nature.   MDM   1. Fall at home, initial encounter   2. Left elbow pain    Patient presents after fall last night with worsening elbow pain. All imaging is negative for acute pathology. Some issues with patient de-sating while sleeping during hospital course. Oxygen saturations improved with oxygen being monitored on her forehead.  I suspect that this desaturation is multifactorial and chronic. Patient was wearing silver nail polish which likely affected her finger reading. She will be sent back to [facility]. The patient is happy with this. She has been eager and anxious to go home as she believes her roommate is stealing her jewelry at her SNF. Return instructions given. Vital signs stable for discharge. Dr. Conley Rolls and Dr. Wilkie Aye have evaluated this patient and agrees with plan.     Mora Bellman, PA-C 02/26/13 [phone]

## 2013-02-25 NOTE — ED Notes (Signed)
RA sats 88%.  Encouraged deep breathing.  Sats increased to 91-93%.  Made MD aware and will analyze.

## 2013-02-26 DIAGNOSIS — M25519 Pain in unspecified shoulder: Secondary | ICD-10-CM

## 2013-02-26 NOTE — ED Notes (Signed)
Notified facility, Jasmine December,  that sats on RA cleared by MD, Dr Conley Rolls.  [facility]AR notified per transport.

## 2013-02-26 NOTE — ED Notes (Signed)
Explained oxygen saturation and findings on exam to [facility] at [facility].  Facility is contaction DON to determine if able to accept patient.  Notified PA and MD

## 2013-02-26 NOTE — ED Notes (Signed)
PA aware of sats.  Pt asking for pain meds.  Ok to give.

## 2013-02-26 NOTE — Consult Note (Signed)
[facility] History and Physical  DEMESHIA Downs ZOX:[id number] DOB: November 14, 1930    PCP:   Lupita Raider, MD   Chief Complaint: felt and hurt her shoulder.  Consult for ? Hypoxia.  Requesting Provider:  Aleen Sells, PA_C.  HPI: [name] is an 79 y.o. female with hx of HTN, depression, SNF resident, DNR code status, presents to the ER after a mechanical fall, and hurting her left shoulder.  Xray of her shoulder and wrist revealed no fracture.  Head CT showed no acute pathology as well as cervical CT.  She was found to have hypoxia by pulse oximetry to 70%.  She has no shortness of breath, no confusion, and no peripheral or central sign of cyanosis.  Hospitalist was asked to admit her for further evaluation.  Originally, her SNF refused to take her back because of inability to obtain oxygen until the morning.  Patient absolutely doesn't want to be admitted.  She is perfectly competent.  Rewiew of Systems:  Constitutional: Negative for malaise, fever and chills. No significant weight loss or weight gain Eyes: Negative for eye pain, redness and discharge, diplopia, visual changes, or flashes of light. ENMT: Negative for ear pain, hoarseness, nasal congestion, sinus pressure and sore throat. No headaches; tinnitus, drooling, or problem swallowing. Cardiovascular: Negative for chest pain, palpitations, diaphoresis, dyspnea and peripheral edema. ; No orthopnea, PND Respiratory: Negative for cough, hemoptysis, wheezing and stridor. No pleuritic chestpain. Gastrointestinal: Negative for nausea, vomiting, diarrhea, constipation, abdominal pain, melena, blood in stool, hematemesis, jaundice and rectal bleeding.    Genitourinary: Negative for frequency, dysuria, incontinence,flank pain and hematuria; Musculoskeletal: Negative for back pain and neck pain. Negative for swelling.  Left shoulder is tender with movements. Skin: . Negative for pruritus, rash, abrasions, bruising and skin  lesion.; ulcerations Neuro: Negative for headache, lightheadedness and neck stiffness. Negative for weakness, altered level of consciousness , altered mental status, extremity weakness, burning feet, involuntary movement, seizure and syncope.  Psych: negative for anxiety, depression, insomnia, tearfulness, panic attacks, hallucinations, paranoia, suicidal or homicidal ideation    Past Medical History  Diagnosis Date  . Scoliosis   . Depression   . Breast cancer   . Hypertension   . Thyroid disease   . Anxiety     Past Surgical History  Procedure Laterality Date  . Hip fracture surgery    . Fracture surgery      Medications:  HOME MEDS: Prior to Admission medications   Medication Sig Start Date End Date Taking? Authorizing Provider  acetaminophen (TYLENOL) 325 MG tablet Take 325 mg by mouth every 4 (four) hours as needed. For pain/headache/fever    Yes Historical Provider, MD  amiodarone (PACERONE) 200 MG tablet Take 200 mg by mouth daily.     Yes Historical Provider, MD  amLODipine (NORVASC) 10 MG tablet Take 10 mg by mouth daily.     Yes Historical Provider, MD  amoxicillin-clavulanate (AUGMENTIN) 875-125 MG per tablet Take 1 tablet by mouth 2 (two) times daily. 10 days started 02/17/13   Yes Historical Provider, MD  baclofen (LIORESAL) 10 MG tablet Take 10 mg by mouth 3 (three) times daily.     Yes Historical Provider, MD  benazepril (LOTENSIN) 20 MG tablet Take 20 mg by mouth every morning.   Yes Historical Provider, MD  diazepam (VALIUM) 5 MG tablet Take 5 mg by mouth every 6 (six) hours as needed. For anxiety    Yes Historical Provider, MD  docusate sodium (COLACE) 100 MG capsule Take 100  mg by mouth daily. & as needed for constipation    Yes Historical Provider, MD  furosemide (LASIX) 80 MG tablet Take 80 mg by mouth every morning.     Yes Historical Provider, MD  Gabapentin, PHN, (GRALISE) 600 MG TABS Take 1 tablet by mouth daily with supper.     Yes Historical Provider, MD   levothyroxine (SYNTHROID, LEVOTHROID) 50 MCG tablet Take 50 mcg by mouth daily.     Yes Historical Provider, MD  loperamide (IMODIUM) 2 MG capsule Take 2 mg by mouth 4 (four) times daily as needed. For loose stools/diarrhea    Yes Historical Provider, MD  Multiple Vitamins-Minerals (MULTIVITAMINS THER. W/MINERALS) TABS Take 1 tablet by mouth daily.     Yes Historical Provider, MD  OxyCODONE (OXYCONTIN) 10 mg T12A 12 hr tablet Take 10 mg by mouth every 4 (four) hours as needed (pain).   Yes Historical Provider, MD  polyethylene glycol (MIRALAX / GLYCOLAX) packet Take 17 g by mouth daily as needed. For constipation.    Yes Historical Provider, MD  potassium chloride SA (K-DUR,KLOR-CON) 20 MEQ tablet Take 20 mEq by mouth daily.     Yes Historical Provider, MD  tamoxifen (NOLVADEX) 10 MG tablet Take 10 mg by mouth daily.     Yes Historical Provider, MD  venlafaxine (EFFEXOR-XR) 150 MG 24 hr capsule Take 150 mg by mouth daily.     Yes Historical Provider, MD  warfarin (COUMADIN) 2.5 MG tablet Take 2.5 mg by mouth. At 1700   Yes      Allergies:  Allergies  Allergen Reactions  . Sulfa Antibiotics Other (See Comments)    Per mar    Social History:   reports that she has never smoked. She does not have any smokeless tobacco history on file. She reports that she does not drink alcohol or use illicit drugs.  Family History: No family history on file.   Physical Exam: Filed Vitals:   02/26/13 0115 02/26/13 0130 02/26/13 0215 02/26/13 0243  BP:  133/51 134/50   Pulse: 60 59 59   Temp:    98.6 F (37 C)  TempSrc:    Oral  Resp:      SpO2: 93% 90% 93%    Blood pressure 134/50, pulse 59, temperature 98.6 F (37 C), temperature source Oral, resp. rate 18, SpO2 93.00%.  GEN:  Pleasant patient lying in the stretcher in no acute distress; cooperative with exam. PSYCH:  alert and oriented x4; does not appear anxious or depressed; affect is appropriate. HEENT: Mucous membranes pink and  anicteric; PERRLA; EOM intact; no cervical lymphadenopathy nor thyromegaly or carotid bruit; no JVD; There were no stridor. Neck is very supple. Breasts:: Not examined CHEST WALL: No tenderness CHEST: Normal respiration, clear to auscultation bilaterally.  HEART: Regular rate and rhythm.  There are no murmur, rub, or gallops.   BACK: No kyphosis or scoliosis; no CVA tenderness ABDOMEN: soft and non-tender; no masses, no organomegaly, normal abdominal bowel sounds; no pannus; no intertriginous candida. There is no rebound and no distention. Rectal Exam: Not done EXTREMITIES: No bone or joint deformity; age-appropriate arthropathy of the hands and knees; no edema; no ulcerations.  There is no calf tenderness. Left shoulder is tender with movements.  She has nail polish on all her finger nails. Genitalia: not examined PULSES: 2+ and symmetric SKIN: Normal hydration no rash or ulceration CNS: Cranial nerves 2-12 grossly intact no focal lateralizing neurologic deficit.  Speech is fluent; uvula elevated with phonation, facial  symmetry and tongue midline. DTR are normal bilaterally, cerebella exam is intact, barbinski is negative and strengths are equaled bilaterally.  No sensory loss.   Labs on Admission:  Basic Metabolic Panel:  Recent Labs Lab 02/25/13 1955  NA 135  K 3.8  CL 97  CO2 30  GLUCOSE 108*  BUN 12  CREATININE 0.63  CALCIUM 9.0   Liver Function Tests:  Recent Labs Lab 02/25/13 1955  AST 19  ALT 16  ALKPHOS 76  BILITOT 0.3  PROT 7.8  ALBUMIN 3.7   No results found for this basename: LIPASE, AMYLASE,  in the last 168 hours No results found for this basename: AMMONIA,  in the last 168 hours CBC:  Recent Labs Lab 02/25/13 1846  WBC 7.6  NEUTROABS 4.8  HGB 12.9  HCT 40.5  MCV 82.8  PLT 281   Cardiac Enzymes: No results found for this basename: CKTOTAL, CKMB, CKMBINDEX, TROPONINI,  in the last 168 hours  CBG: No results found for this basename: GLUCAP,  in  the last 168 hours   Radiological Exams on Admission: Dg Chest 2 View  02/25/2013   CLINICAL DATA:  Weakness and shortness of breath  EXAM: CHEST  2 VIEW  COMPARISON:  August 16, 2012  FINDINGS: There is no edema or consolidation. The heart is mildly enlarged with normal pulmonary vascularity. No adenopathy. There is a hiatal hernia present.  IMPRESSION: Mild cardiac enlargement. Hiatal hernia present. No edema or consolidation.   Electronically Signed   By: Bretta Bang M.D.   On: 02/25/2013 21:20   Dg Elbow Complete Left  02/25/2013   CLINICAL DATA:  Left elbow pain, fell 1 day ago  EXAM: LEFT ELBOW - COMPLETE 3+ VIEW  COMPARISON:  None  FINDINGS: Mild osseous demineralization.  Joint spaces preserved.  No acute fracture, dislocation or bone destruction.  No elbow joint effusion.  IMPRESSION: Normal exam.   Electronically Signed   By: Ulyses Southward M.D.   On: 02/25/2013 18:46   Ct Head Wo Contrast  02/25/2013   CLINICAL DATA:  Fall. Head pain. Neck pain.  EXAM: CT HEAD WITHOUT CONTRAST  CT CERVICAL SPINE WITHOUT CONTRAST  TECHNIQUE: Multidetector CT imaging of the head and cervical spine was performed following the standard protocol without intravenous contrast. Multiplanar CT image reconstructions of the cervical spine were also generated.  COMPARISON:  11/16/2012.  FINDINGS: CT HEAD FINDINGS  No evidence for acute infarction, hemorrhage, mass lesion, hydrocephalus, or extra-axial fluid. Moderate cerebral and cerebellar atrophy. Chronic microvascular ischemic change of a moderate degree affects the periventricular and subcortical white matter. There is no skull fracture. Sinuses and mastoids are clear. Moderate vascular calcification of the proximal intracranial vessels is evident. Similar appearance to priors.  CT CERVICAL SPINE FINDINGS  There is no visible cervical spine fracture, traumatic subluxation, prevertebral soft tissue swelling, or intraspinal hematoma. Advanced spondylosis is present  most notably from C5 through T1. Facet mediated slip C7-T1. Mild pannus. No pneumothorax. No neck masses. Airway midline. Widespread facet arthropathy.  IMPRESSION: No acute intracranial abnormality. Stable atrophy and small vessel disease.  Spondylosis without cervical spine fracture or traumatic subluxation.   Electronically Signed   By: Davonna Belling M.D.   On: 02/25/2013 18:36   Ct Cervical Spine Wo Contrast  02/25/2013   CLINICAL DATA:  Fall. Head pain. Neck pain.  EXAM: CT HEAD WITHOUT CONTRAST  CT CERVICAL SPINE WITHOUT CONTRAST  TECHNIQUE: Multidetector CT imaging of the head and cervical spine was performed  following the standard protocol without intravenous contrast. Multiplanar CT image reconstructions of the cervical spine were also generated.  COMPARISON:  11/16/2012.  FINDINGS: CT HEAD FINDINGS  No evidence for acute infarction, hemorrhage, mass lesion, hydrocephalus, or extra-axial fluid. Moderate cerebral and cerebellar atrophy. Chronic microvascular ischemic change of a moderate degree affects the periventricular and subcortical white matter. There is no skull fracture. Sinuses and mastoids are clear. Moderate vascular calcification of the proximal intracranial vessels is evident. Similar appearance to priors.  CT CERVICAL SPINE FINDINGS  There is no visible cervical spine fracture, traumatic subluxation, prevertebral soft tissue swelling, or intraspinal hematoma. Advanced spondylosis is present most notably from C5 through T1. Facet mediated slip C7-T1. Mild pannus. No pneumothorax. No neck masses. Airway midline. Widespread facet arthropathy.  IMPRESSION: No acute intracranial abnormality. Stable atrophy and small vessel disease.  Spondylosis without cervical spine fracture or traumatic subluxation.   Electronically Signed   By: Davonna Belling M.D.   On: 02/25/2013 18:36   Dg Shoulder Left  02/25/2013   CLINICAL DATA:  Left shoulder pain, fell 1 day ago  EXAM: LEFT SHOULDER - 2+ VIEW   COMPARISON:  None  FINDINGS: AC joint alignment normal.  Question mild osseous demineralization.  Inferior acromial spur formation noted.  No acute fracture, dislocation, or bone destruction.  Visualized portions of the left ribs appear intact.  IMPRESSION: No acute osseous abnormalities.  Inferior acromial spur formation, which may predispose the patient to rotator cuff pathology.   Electronically Signed   By: Ulyses Southward M.D.   On: 02/25/2013 18:47   Dg Hand Complete Left  02/25/2013   CLINICAL DATA:  Left hand pain, fell 1 day ago  EXAM: LEFT HAND - COMPLETE 3+ VIEW  COMPARISON:  04/21/2012  FINDINGS: Mild osseous demineralization.  Scattered narrowing of interphalangeal joints with spur formation.  Minimal degenerative changes also present at the 1st and 2nd MCP joints.  Fingers partially superimposed on lateral view limiting assessment.  No acute fracture, dislocation, or bone destruction.  IMPRESSION: Scattered degenerative changes of primarily interphalangeal joints.  No acute abnormalities.   Electronically Signed   By: Ulyses Southward M.D.   On: 02/25/2013 18:45    Assessment/Plan Fall Left shoulder pain ?Hypoxia by pulse oximetry.  PLAN:  I think it was an erroneous reading as it fluctuate randomly.  When I removed the pulse oximetry from her finger tip, and place it on her forehead, it read 95% Sat.  She doesn't need to be admitted, as she doesn't need supplemental oxygen.  I recommended to have her return to her SNF as she is insisting to go back.  Please have her follow up with her PCP for her shoulder and if she has further problem with oxygen desaturation.  I don't think she has sleep apnea, but if more signs and symptoms are found, an outpatient polysomnogram can be done.  Thank you for asking me to consult on her care.  Other plans as per orders.  Code Status: DNR (golden rod found)   Reagann Dolce, MD. [facility] Pager [phone] 7pm to 7am.  02/26/2013, 4:57 AM

## 2013-02-27 DIAGNOSIS — M25519 Pain in unspecified shoulder: Secondary | ICD-10-CM | POA: Diagnosis not present

## 2013-02-27 DIAGNOSIS — IMO0002 Reserved for concepts with insufficient information to code with codable children: Secondary | ICD-10-CM | POA: Diagnosis not present

## 2013-02-27 DIAGNOSIS — S8000XA Contusion of unspecified knee, initial encounter: Secondary | ICD-10-CM | POA: Diagnosis not present

## 2013-02-27 DIAGNOSIS — S40019A Contusion of unspecified shoulder, initial encounter: Secondary | ICD-10-CM | POA: Diagnosis not present

## 2013-02-27 NOTE — ED Provider Notes (Signed)
Medical screening examination/treatment/procedure(s) were conducted as a shared visit with non-physician practitioner(s) and myself.  I personally evaluated the patient during the encounter  Patient presents with fall.  Patient fell yesterday onto left side.  She states that she's not sure why she fell but thinks she tripped.  History of recurrent falls.  Denies hitting her head or LOC.  Patient is on Coumadin.  Exam notable for pulse ox in low 90's.  TTP over the left distal femur and elbow without deformity.  Full ROM.  No c-spine tenderness.  No obvious trauma to the head, chest or abdomen.  Labwork notable for supratherapeutic INR at 3.98.  Given this head and neck CT ordered.  Plain films also ordered and no obvious fracture noted.  Patient continued to have marginal O2 sats.  She is adamant that she has had low oxygen in the past but is not on home O2.  Patient does not want to be admitted. Patient has cool fingers and was wearing nail polish.  Pulse ox moved to forehead with more consistent readings in the 90's without O2.  Evaluated by hospitalist who does not feel patient needs admission and patient continues to express her wishes to be discharged.  Suspect recorded low O2 sats likely multifactorial.  Low suspicion for acute process with normal chest xray and supratherapeutic INR (PE unlikely).  Patient will be discharged back to nursing facility.  Nursing facility made aware of work-up.  Shon Baton, MD 02/27/13 [phone]

## 2013-05-15 DIAGNOSIS — I890 Lymphedema, not elsewhere classified: Secondary | ICD-10-CM | POA: Diagnosis not present

## 2013-05-15 DIAGNOSIS — M545 Low back pain, unspecified: Secondary | ICD-10-CM | POA: Diagnosis not present

## 2013-05-15 DIAGNOSIS — M159 Polyosteoarthritis, unspecified: Secondary | ICD-10-CM | POA: Diagnosis not present

## 2013-05-15 DIAGNOSIS — M79609 Pain in unspecified limb: Secondary | ICD-10-CM | POA: Diagnosis not present

## 2013-05-15 DIAGNOSIS — R262 Difficulty in walking, not elsewhere classified: Secondary | ICD-10-CM | POA: Diagnosis not present

## 2013-05-15 DIAGNOSIS — R279 Unspecified lack of coordination: Secondary | ICD-10-CM | POA: Diagnosis not present

## 2013-05-20 DIAGNOSIS — M159 Polyosteoarthritis, unspecified: Secondary | ICD-10-CM | POA: Diagnosis not present

## 2013-05-20 DIAGNOSIS — M79609 Pain in unspecified limb: Secondary | ICD-10-CM | POA: Diagnosis not present

## 2013-05-20 DIAGNOSIS — R279 Unspecified lack of coordination: Secondary | ICD-10-CM | POA: Diagnosis not present

## 2013-05-20 DIAGNOSIS — R262 Difficulty in walking, not elsewhere classified: Secondary | ICD-10-CM | POA: Diagnosis not present

## 2013-05-20 DIAGNOSIS — I890 Lymphedema, not elsewhere classified: Secondary | ICD-10-CM | POA: Diagnosis not present

## 2013-05-20 DIAGNOSIS — M545 Low back pain, unspecified: Secondary | ICD-10-CM | POA: Diagnosis not present

## 2013-05-21 DIAGNOSIS — D638 Anemia in other chronic diseases classified elsewhere: Secondary | ICD-10-CM | POA: Diagnosis not present

## 2013-05-21 DIAGNOSIS — F329 Major depressive disorder, single episode, unspecified: Secondary | ICD-10-CM | POA: Diagnosis not present

## 2013-05-21 DIAGNOSIS — E039 Hypothyroidism, unspecified: Secondary | ICD-10-CM | POA: Diagnosis not present

## 2013-05-21 DIAGNOSIS — C50919 Malignant neoplasm of unspecified site of unspecified female breast: Secondary | ICD-10-CM | POA: Diagnosis not present

## 2013-05-21 DIAGNOSIS — I4891 Unspecified atrial fibrillation: Secondary | ICD-10-CM | POA: Diagnosis not present

## 2013-05-21 DIAGNOSIS — I890 Lymphedema, not elsewhere classified: Secondary | ICD-10-CM | POA: Diagnosis not present

## 2013-05-21 DIAGNOSIS — IMO0001 Reserved for inherently not codable concepts without codable children: Secondary | ICD-10-CM | POA: Diagnosis not present

## 2013-05-21 DIAGNOSIS — I119 Hypertensive heart disease without heart failure: Secondary | ICD-10-CM | POA: Diagnosis not present

## 2013-05-27 DIAGNOSIS — R279 Unspecified lack of coordination: Secondary | ICD-10-CM | POA: Diagnosis not present

## 2013-05-27 DIAGNOSIS — M79609 Pain in unspecified limb: Secondary | ICD-10-CM | POA: Diagnosis not present

## 2013-05-27 DIAGNOSIS — R262 Difficulty in walking, not elsewhere classified: Secondary | ICD-10-CM | POA: Diagnosis not present

## 2013-05-27 DIAGNOSIS — M545 Low back pain, unspecified: Secondary | ICD-10-CM | POA: Diagnosis not present

## 2013-05-27 DIAGNOSIS — M159 Polyosteoarthritis, unspecified: Secondary | ICD-10-CM | POA: Diagnosis not present

## 2013-05-27 DIAGNOSIS — I890 Lymphedema, not elsewhere classified: Secondary | ICD-10-CM | POA: Diagnosis not present

## 2013-07-08 DIAGNOSIS — IMO0001 Reserved for inherently not codable concepts without codable children: Secondary | ICD-10-CM | POA: Diagnosis not present

## 2013-07-08 DIAGNOSIS — C50919 Malignant neoplasm of unspecified site of unspecified female breast: Secondary | ICD-10-CM | POA: Diagnosis not present

## 2013-07-08 DIAGNOSIS — L03039 Cellulitis of unspecified toe: Secondary | ICD-10-CM | POA: Diagnosis not present

## 2013-07-08 DIAGNOSIS — I4891 Unspecified atrial fibrillation: Secondary | ICD-10-CM | POA: Diagnosis not present

## 2013-07-08 DIAGNOSIS — E039 Hypothyroidism, unspecified: Secondary | ICD-10-CM | POA: Diagnosis not present

## 2013-07-08 DIAGNOSIS — I119 Hypertensive heart disease without heart failure: Secondary | ICD-10-CM | POA: Diagnosis not present

## 2013-07-08 DIAGNOSIS — L02619 Cutaneous abscess of unspecified foot: Secondary | ICD-10-CM | POA: Diagnosis not present

## 2013-07-08 DIAGNOSIS — I890 Lymphedema, not elsewhere classified: Secondary | ICD-10-CM | POA: Diagnosis not present

## 2013-07-08 DIAGNOSIS — F329 Major depressive disorder, single episode, unspecified: Secondary | ICD-10-CM | POA: Diagnosis not present

## 2013-07-13 DIAGNOSIS — M67919 Unspecified disorder of synovium and tendon, unspecified shoulder: Secondary | ICD-10-CM | POA: Diagnosis not present

## 2013-07-13 DIAGNOSIS — M47817 Spondylosis without myelopathy or radiculopathy, lumbosacral region: Secondary | ICD-10-CM | POA: Diagnosis not present

## 2013-07-13 DIAGNOSIS — G894 Chronic pain syndrome: Secondary | ICD-10-CM | POA: Diagnosis not present

## 2013-07-13 DIAGNOSIS — M719 Bursopathy, unspecified: Secondary | ICD-10-CM | POA: Diagnosis not present

## 2013-07-13 DIAGNOSIS — M62838 Other muscle spasm: Secondary | ICD-10-CM | POA: Diagnosis not present

## 2013-07-20 DIAGNOSIS — M25569 Pain in unspecified knee: Secondary | ICD-10-CM | POA: Diagnosis not present

## 2013-07-20 DIAGNOSIS — IMO0002 Reserved for concepts with insufficient information to code with codable children: Secondary | ICD-10-CM | POA: Diagnosis not present

## 2013-07-20 DIAGNOSIS — M25559 Pain in unspecified hip: Secondary | ICD-10-CM | POA: Diagnosis not present

## 2013-08-06 DIAGNOSIS — IMO0001 Reserved for inherently not codable concepts without codable children: Secondary | ICD-10-CM | POA: Diagnosis not present

## 2013-08-06 DIAGNOSIS — I119 Hypertensive heart disease without heart failure: Secondary | ICD-10-CM | POA: Diagnosis not present

## 2013-08-06 DIAGNOSIS — F329 Major depressive disorder, single episode, unspecified: Secondary | ICD-10-CM | POA: Diagnosis not present

## 2013-08-06 DIAGNOSIS — E669 Obesity, unspecified: Secondary | ICD-10-CM | POA: Diagnosis not present

## 2013-08-06 DIAGNOSIS — I890 Lymphedema, not elsewhere classified: Secondary | ICD-10-CM | POA: Diagnosis not present

## 2013-08-06 DIAGNOSIS — E039 Hypothyroidism, unspecified: Secondary | ICD-10-CM | POA: Diagnosis not present

## 2013-08-06 DIAGNOSIS — C50919 Malignant neoplasm of unspecified site of unspecified female breast: Secondary | ICD-10-CM | POA: Diagnosis not present

## 2013-08-06 DIAGNOSIS — I4891 Unspecified atrial fibrillation: Secondary | ICD-10-CM | POA: Diagnosis not present

## 2013-08-07 DIAGNOSIS — L608 Other nail disorders: Secondary | ICD-10-CM | POA: Diagnosis not present

## 2013-08-07 DIAGNOSIS — I739 Peripheral vascular disease, unspecified: Secondary | ICD-10-CM | POA: Diagnosis not present

## 2013-08-07 DIAGNOSIS — L84 Corns and callosities: Secondary | ICD-10-CM | POA: Diagnosis not present

## 2013-08-10 DIAGNOSIS — H1044 Vernal conjunctivitis: Secondary | ICD-10-CM | POA: Diagnosis not present

## 2013-09-15 DIAGNOSIS — Z6834 Body mass index (BMI) 34.0-34.9, adult: Secondary | ICD-10-CM | POA: Diagnosis not present

## 2013-09-15 DIAGNOSIS — Z23 Encounter for immunization: Secondary | ICD-10-CM | POA: Diagnosis not present

## 2013-09-15 DIAGNOSIS — I890 Lymphedema, not elsewhere classified: Secondary | ICD-10-CM | POA: Diagnosis not present

## 2013-09-15 DIAGNOSIS — F329 Major depressive disorder, single episode, unspecified: Secondary | ICD-10-CM | POA: Diagnosis not present

## 2013-09-15 DIAGNOSIS — C50919 Malignant neoplasm of unspecified site of unspecified female breast: Secondary | ICD-10-CM | POA: Diagnosis not present

## 2013-09-15 DIAGNOSIS — IMO0001 Reserved for inherently not codable concepts without codable children: Secondary | ICD-10-CM | POA: Diagnosis not present

## 2013-09-15 DIAGNOSIS — I4891 Unspecified atrial fibrillation: Secondary | ICD-10-CM | POA: Diagnosis not present

## 2013-09-15 DIAGNOSIS — I119 Hypertensive heart disease without heart failure: Secondary | ICD-10-CM | POA: Diagnosis not present

## 2013-09-15 DIAGNOSIS — E669 Obesity, unspecified: Secondary | ICD-10-CM | POA: Diagnosis not present

## 2013-10-26 DIAGNOSIS — H6120 Impacted cerumen, unspecified ear: Secondary | ICD-10-CM | POA: Diagnosis not present

## 2013-11-01 ENCOUNTER — Emergency Department (HOSPITAL_COMMUNITY)
Admission: EM | Admit: 2013-11-01 | Discharge: 2013-11-01 | Disposition: A | Discharge status: Other Acute Inpatient Hospital | Payer: Medicare Other | Attending: Emergency Medicine | Admitting: Emergency Medicine

## 2013-11-01 ENCOUNTER — Emergency Department (HOSPITAL_COMMUNITY): Payer: Medicare Other

## 2013-11-01 ENCOUNTER — Encounter (HOSPITAL_COMMUNITY): Payer: Self-pay | Admitting: Emergency Medicine

## 2013-11-01 DIAGNOSIS — Z8781 Personal history of (healed) traumatic fracture: Secondary | ICD-10-CM | POA: Diagnosis not present

## 2013-11-01 DIAGNOSIS — S199XXA Unspecified injury of neck, initial encounter: Secondary | ICD-10-CM | POA: Diagnosis not present

## 2013-11-01 DIAGNOSIS — R51 Headache: Secondary | ICD-10-CM | POA: Diagnosis not present

## 2013-11-01 DIAGNOSIS — Z23 Encounter for immunization: Secondary | ICD-10-CM | POA: Insufficient documentation

## 2013-11-01 DIAGNOSIS — F329 Major depressive disorder, single episode, unspecified: Secondary | ICD-10-CM | POA: Insufficient documentation

## 2013-11-01 DIAGNOSIS — S81809A Unspecified open wound, unspecified lower leg, initial encounter: Secondary | ICD-10-CM | POA: Diagnosis not present

## 2013-11-01 DIAGNOSIS — S0003XA Contusion of scalp, initial encounter: Secondary | ICD-10-CM | POA: Insufficient documentation

## 2013-11-01 DIAGNOSIS — F411 Generalized anxiety disorder: Secondary | ICD-10-CM | POA: Diagnosis not present

## 2013-11-01 DIAGNOSIS — C801 Malignant (primary) neoplasm, unspecified: Secondary | ICD-10-CM | POA: Diagnosis not present

## 2013-11-01 DIAGNOSIS — S81009A Unspecified open wound, unspecified knee, initial encounter: Secondary | ICD-10-CM | POA: Insufficient documentation

## 2013-11-01 DIAGNOSIS — Z853 Personal history of malignant neoplasm of breast: Secondary | ICD-10-CM | POA: Insufficient documentation

## 2013-11-01 DIAGNOSIS — M546 Pain in thoracic spine: Secondary | ICD-10-CM | POA: Diagnosis not present

## 2013-11-01 DIAGNOSIS — Z79899 Other long term (current) drug therapy: Secondary | ICD-10-CM | POA: Insufficient documentation

## 2013-11-01 DIAGNOSIS — S51809A Unspecified open wound of unspecified forearm, initial encounter: Secondary | ICD-10-CM | POA: Diagnosis not present

## 2013-11-01 DIAGNOSIS — M4120 Other idiopathic scoliosis, site unspecified: Secondary | ICD-10-CM | POA: Diagnosis not present

## 2013-11-01 DIAGNOSIS — E079 Disorder of thyroid, unspecified: Secondary | ICD-10-CM | POA: Insufficient documentation

## 2013-11-01 DIAGNOSIS — I10 Essential (primary) hypertension: Secondary | ICD-10-CM | POA: Diagnosis not present

## 2013-11-01 DIAGNOSIS — F3289 Other specified depressive episodes: Secondary | ICD-10-CM | POA: Insufficient documentation

## 2013-11-01 DIAGNOSIS — S0083XA Contusion of other part of head, initial encounter: Secondary | ICD-10-CM | POA: Diagnosis not present

## 2013-11-01 DIAGNOSIS — S41111A Laceration without foreign body of right upper arm, initial encounter: Secondary | ICD-10-CM

## 2013-11-01 DIAGNOSIS — IMO0002 Reserved for concepts with insufficient information to code with codable children: Secondary | ICD-10-CM | POA: Diagnosis not present

## 2013-11-01 DIAGNOSIS — S1093XA Contusion of unspecified part of neck, initial encounter: Secondary | ICD-10-CM | POA: Diagnosis not present

## 2013-11-01 DIAGNOSIS — S91009A Unspecified open wound, unspecified ankle, initial encounter: Secondary | ICD-10-CM

## 2013-11-01 DIAGNOSIS — Z7901 Long term (current) use of anticoagulants: Secondary | ICD-10-CM | POA: Diagnosis not present

## 2013-11-01 DIAGNOSIS — S0510XA Contusion of eyeball and orbital tissues, unspecified eye, initial encounter: Secondary | ICD-10-CM | POA: Diagnosis not present

## 2013-11-01 DIAGNOSIS — T1490XA Injury, unspecified, initial encounter: Secondary | ICD-10-CM | POA: Diagnosis not present

## 2013-11-01 MED ORDER — TETANUS-DIPHTH-ACELL PERTUSSIS 5-2.5-18.5 LF-MCG/0.5 IM SUSP
0.5000 mL | Freq: Once | INTRAMUSCULAR | Status: AC
  Administered 2013-11-01: 0.5 mL via INTRAMUSCULAR
  Filled 2013-11-01: qty 0.5

## 2013-11-01 MED ORDER — OXYCODONE HCL 5 MG PO TABS
10.0000 mg | ORAL_TABLET | Freq: Once | ORAL | Status: AC
  Administered 2013-11-01: 10 mg via ORAL
  Filled 2013-11-01: qty 2
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
## 2013-11-01 NOTE — ED Notes (Signed)
Pt in CT.
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
## 2013-11-01 NOTE — ED Notes (Signed)
Dr Regenia Skeeter at bedside suturing wound

## 2013-11-01 NOTE — Discharge Instructions (Signed)
Assault, General  Assault includes any behavior, whether intentional or reckless, which results in bodily injury to another person and/or damage to property. Included in this would be any behavior, intentional or reckless, that by its nature would be understood (interpreted) by a reasonable person as intent to harm another person or to damage his/her property. Threats may be oral or written. They may be communicated through regular mail, computer, fax, or phone. These threats may be direct or implied.  FORMS OF ASSAULT INCLUDE:  · Physically assaulting a person. This includes physical threats to inflict physical harm as well as:  ¨ Slapping.  ¨ Hitting.  ¨ Poking.  ¨ Kicking.  ¨ Punching.  ¨ Pushing.  · Arson.  · Sabotage.  · Equipment vandalism.  · Damaging or destroying property.  · Throwing or hitting objects.  · Displaying a weapon or an object that appears to be a weapon in a threatening manner.  ¨ Carrying a firearm of any kind.  ¨ Using a weapon to harm someone.  · Using greater physical size/strength to intimidate another.  ¨ Making intimidating or threatening gestures.  ¨ Bullying.  ¨ Hazing.  · Intimidating, threatening, hostile, or abusive language directed toward another person.  ¨ It communicates the intention to engage in violence against that person. And it leads a reasonable person to expect that violent behavior may occur.  · Stalking another person.  IF IT HAPPENS AGAIN:  · Immediately call for emergency help (911 in [place]  · If someone poses clear and immediate danger to you, seek legal authorities to have a protective or restraining order put in place.  · Less threatening assaults can at least be reported to authorities.  STEPS TO TAKE IF A SEXUAL ASSAULT HAS HAPPENED  · Go to an area of safety. This may include a shelter or staying with a friend. Stay away from the area where you have been attacked. A large percentage of sexual assaults are caused by a friend, relative or associate.  · If  medications were given by your caregiver, take them as directed for the full length of time prescribed.  · Only take over-the-counter or prescription medicines for pain, discomfort, or fever as directed by your caregiver.  · If you have come in contact with a sexual disease, find out if you are to be tested again. If your caregiver is concerned about the HIV/AIDS virus, he/she may require you to have continued testing for several months.  · For the protection of your privacy, test results can not be given over the phone. Make sure you receive the results of your test. If your test results are not back during your visit, make an appointment with your caregiver to find out the results. Do not assume everything is normal if you have not heard from your caregiver or the medical facility. It is important for you to follow up on all of your test results.  · File appropriate papers with authorities. This is important in all assaults, even if it has occurred in a family or by a friend.  SEEK MEDICAL CARE IF:  · You have new problems because of your injuries.  · You have problems that may be because of the medicine you are taking, such as:  ¨ Rash.  ¨ Itching.  ¨ Swelling.  ¨ Trouble breathing.  · You develop belly (abdominal) pain, feel sick to your stomach (nausea) or are vomiting.  · You begin to run a temperature.  · You   need supportive care or referral to a rape crisis center. These are centers with trained personnel who can help you get through this ordeal.  SEEK IMMEDIATE MEDICAL CARE IF:  · You are afraid of being threatened, beaten, or abused. In [place] call 911.  · You receive new injuries related to abuse.  · You develop severe pain in any area injured in the assault or have any change in your condition that concerns you.  · You faint or lose consciousness.  · You develop chest pain or shortness of breath.  Document Released: 04/30/2005 Document Revised: 07/23/2011 Document Reviewed: 12/17/2007  [employer]® Patient  Information ©2015 [employer]. This information is not intended to replace advice given to you by your health care provider. Make sure you discuss any questions you have with your health care provider.
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
## 2013-11-04 DIAGNOSIS — G894 Chronic pain syndrome: Secondary | ICD-10-CM | POA: Diagnosis not present

## 2013-11-04 DIAGNOSIS — M719 Bursopathy, unspecified: Secondary | ICD-10-CM | POA: Diagnosis not present

## 2013-11-04 DIAGNOSIS — M47817 Spondylosis without myelopathy or radiculopathy, lumbosacral region: Secondary | ICD-10-CM | POA: Diagnosis not present

## 2013-11-04 DIAGNOSIS — M62838 Other muscle spasm: Secondary | ICD-10-CM | POA: Diagnosis not present

## 2013-11-04 DIAGNOSIS — M67919 Unspecified disorder of synovium and tendon, unspecified shoulder: Secondary | ICD-10-CM | POA: Diagnosis not present

## 2013-11-05 DIAGNOSIS — L84 Corns and callosities: Secondary | ICD-10-CM | POA: Diagnosis not present

## 2013-11-05 DIAGNOSIS — L608 Other nail disorders: Secondary | ICD-10-CM | POA: Diagnosis not present

## 2013-11-05 DIAGNOSIS — I739 Peripheral vascular disease, unspecified: Secondary | ICD-10-CM | POA: Diagnosis not present

## 2013-11-09 DIAGNOSIS — H20019 Primary iridocyclitis, unspecified eye: Secondary | ICD-10-CM | POA: Diagnosis not present

## 2013-11-10 DIAGNOSIS — Z4802 Encounter for removal of sutures: Secondary | ICD-10-CM | POA: Diagnosis not present

## 2013-11-10 DIAGNOSIS — T148XXA Other injury of unspecified body region, initial encounter: Secondary | ICD-10-CM | POA: Diagnosis not present

## 2013-11-16 DIAGNOSIS — H20019 Primary iridocyclitis, unspecified eye: Secondary | ICD-10-CM | POA: Diagnosis not present

## 2013-12-11 DIAGNOSIS — I10 Essential (primary) hypertension: Secondary | ICD-10-CM | POA: Diagnosis not present

## 2013-12-11 DIAGNOSIS — M4120 Other idiopathic scoliosis, site unspecified: Secondary | ICD-10-CM | POA: Diagnosis not present

## 2013-12-11 DIAGNOSIS — IMO0001 Reserved for inherently not codable concepts without codable children: Secondary | ICD-10-CM | POA: Diagnosis not present

## 2013-12-11 DIAGNOSIS — F329 Major depressive disorder, single episode, unspecified: Secondary | ICD-10-CM | POA: Diagnosis not present

## 2013-12-11 DIAGNOSIS — I4891 Unspecified atrial fibrillation: Secondary | ICD-10-CM | POA: Diagnosis not present

## 2013-12-11 DIAGNOSIS — I890 Lymphedema, not elsewhere classified: Secondary | ICD-10-CM | POA: Diagnosis not present

## 2013-12-11 DIAGNOSIS — I2510 Atherosclerotic heart disease of native coronary artery without angina pectoris: Secondary | ICD-10-CM | POA: Diagnosis not present

## 2013-12-11 DIAGNOSIS — R269 Unspecified abnormalities of gait and mobility: Secondary | ICD-10-CM | POA: Diagnosis not present

## 2013-12-14 DIAGNOSIS — M4120 Other idiopathic scoliosis, site unspecified: Secondary | ICD-10-CM | POA: Diagnosis not present

## 2013-12-14 DIAGNOSIS — F329 Major depressive disorder, single episode, unspecified: Secondary | ICD-10-CM | POA: Diagnosis not present

## 2013-12-14 DIAGNOSIS — I890 Lymphedema, not elsewhere classified: Secondary | ICD-10-CM | POA: Diagnosis not present

## 2013-12-14 DIAGNOSIS — I4891 Unspecified atrial fibrillation: Secondary | ICD-10-CM | POA: Diagnosis not present

## 2013-12-14 DIAGNOSIS — I10 Essential (primary) hypertension: Secondary | ICD-10-CM | POA: Diagnosis not present

## 2013-12-16 DIAGNOSIS — I10 Essential (primary) hypertension: Secondary | ICD-10-CM | POA: Diagnosis not present

## 2013-12-16 DIAGNOSIS — I4891 Unspecified atrial fibrillation: Secondary | ICD-10-CM | POA: Diagnosis not present

## 2013-12-16 DIAGNOSIS — M4120 Other idiopathic scoliosis, site unspecified: Secondary | ICD-10-CM | POA: Diagnosis not present

## 2013-12-16 DIAGNOSIS — I890 Lymphedema, not elsewhere classified: Secondary | ICD-10-CM | POA: Diagnosis not present

## 2013-12-16 DIAGNOSIS — F329 Major depressive disorder, single episode, unspecified: Secondary | ICD-10-CM | POA: Diagnosis not present

## 2013-12-23 DIAGNOSIS — I4891 Unspecified atrial fibrillation: Secondary | ICD-10-CM | POA: Diagnosis not present

## 2013-12-23 DIAGNOSIS — F329 Major depressive disorder, single episode, unspecified: Secondary | ICD-10-CM | POA: Diagnosis not present

## 2013-12-23 DIAGNOSIS — I10 Essential (primary) hypertension: Secondary | ICD-10-CM | POA: Diagnosis not present

## 2013-12-23 DIAGNOSIS — M4120 Other idiopathic scoliosis, site unspecified: Secondary | ICD-10-CM | POA: Diagnosis not present

## 2013-12-23 DIAGNOSIS — I890 Lymphedema, not elsewhere classified: Secondary | ICD-10-CM | POA: Diagnosis not present

## 2013-12-29 DIAGNOSIS — G894 Chronic pain syndrome: Secondary | ICD-10-CM | POA: Diagnosis not present

## 2013-12-29 DIAGNOSIS — M719 Bursopathy, unspecified: Secondary | ICD-10-CM | POA: Diagnosis not present

## 2013-12-29 DIAGNOSIS — M62838 Other muscle spasm: Secondary | ICD-10-CM | POA: Diagnosis not present

## 2013-12-29 DIAGNOSIS — M67919 Unspecified disorder of synovium and tendon, unspecified shoulder: Secondary | ICD-10-CM | POA: Diagnosis not present

## 2013-12-29 DIAGNOSIS — M47817 Spondylosis without myelopathy or radiculopathy, lumbosacral region: Secondary | ICD-10-CM | POA: Diagnosis not present

## 2013-12-30 DIAGNOSIS — M4120 Other idiopathic scoliosis, site unspecified: Secondary | ICD-10-CM | POA: Diagnosis not present

## 2013-12-30 DIAGNOSIS — I4891 Unspecified atrial fibrillation: Secondary | ICD-10-CM | POA: Diagnosis not present

## 2013-12-30 DIAGNOSIS — F329 Major depressive disorder, single episode, unspecified: Secondary | ICD-10-CM | POA: Diagnosis not present

## 2013-12-30 DIAGNOSIS — I890 Lymphedema, not elsewhere classified: Secondary | ICD-10-CM | POA: Diagnosis not present

## 2013-12-30 DIAGNOSIS — I10 Essential (primary) hypertension: Secondary | ICD-10-CM | POA: Diagnosis not present

## 2013-12-31 DIAGNOSIS — IMO0001 Reserved for inherently not codable concepts without codable children: Secondary | ICD-10-CM | POA: Diagnosis not present

## 2013-12-31 DIAGNOSIS — Z6834 Body mass index (BMI) 34.0-34.9, adult: Secondary | ICD-10-CM | POA: Diagnosis not present

## 2013-12-31 DIAGNOSIS — C50919 Malignant neoplasm of unspecified site of unspecified female breast: Secondary | ICD-10-CM | POA: Diagnosis not present

## 2013-12-31 DIAGNOSIS — E039 Hypothyroidism, unspecified: Secondary | ICD-10-CM | POA: Diagnosis not present

## 2013-12-31 DIAGNOSIS — I119 Hypertensive heart disease without heart failure: Secondary | ICD-10-CM | POA: Diagnosis not present

## 2013-12-31 DIAGNOSIS — I890 Lymphedema, not elsewhere classified: Secondary | ICD-10-CM | POA: Diagnosis not present

## 2013-12-31 DIAGNOSIS — E669 Obesity, unspecified: Secondary | ICD-10-CM | POA: Diagnosis not present

## 2013-12-31 DIAGNOSIS — I4891 Unspecified atrial fibrillation: Secondary | ICD-10-CM | POA: Diagnosis not present

## 2013-12-31 DIAGNOSIS — F329 Major depressive disorder, single episode, unspecified: Secondary | ICD-10-CM | POA: Diagnosis not present

## 2014-01-06 DIAGNOSIS — I890 Lymphedema, not elsewhere classified: Secondary | ICD-10-CM | POA: Diagnosis not present

## 2014-01-06 DIAGNOSIS — I4891 Unspecified atrial fibrillation: Secondary | ICD-10-CM | POA: Diagnosis not present

## 2014-01-06 DIAGNOSIS — I10 Essential (primary) hypertension: Secondary | ICD-10-CM | POA: Diagnosis not present

## 2014-01-06 DIAGNOSIS — F329 Major depressive disorder, single episode, unspecified: Secondary | ICD-10-CM | POA: Diagnosis not present

## 2014-01-06 DIAGNOSIS — M4120 Other idiopathic scoliosis, site unspecified: Secondary | ICD-10-CM | POA: Diagnosis not present

## 2014-01-07 DIAGNOSIS — I10 Essential (primary) hypertension: Secondary | ICD-10-CM | POA: Diagnosis not present

## 2014-01-07 DIAGNOSIS — I4891 Unspecified atrial fibrillation: Secondary | ICD-10-CM | POA: Diagnosis not present

## 2014-01-07 DIAGNOSIS — F329 Major depressive disorder, single episode, unspecified: Secondary | ICD-10-CM | POA: Diagnosis not present

## 2014-01-07 DIAGNOSIS — M4120 Other idiopathic scoliosis, site unspecified: Secondary | ICD-10-CM | POA: Diagnosis not present

## 2014-01-07 DIAGNOSIS — I890 Lymphedema, not elsewhere classified: Secondary | ICD-10-CM | POA: Diagnosis not present

## 2014-01-12 DIAGNOSIS — I4891 Unspecified atrial fibrillation: Secondary | ICD-10-CM | POA: Diagnosis not present

## 2014-01-12 DIAGNOSIS — I890 Lymphedema, not elsewhere classified: Secondary | ICD-10-CM | POA: Diagnosis not present

## 2014-01-12 DIAGNOSIS — I10 Essential (primary) hypertension: Secondary | ICD-10-CM | POA: Diagnosis not present

## 2014-01-12 DIAGNOSIS — M4120 Other idiopathic scoliosis, site unspecified: Secondary | ICD-10-CM | POA: Diagnosis not present

## 2014-01-12 DIAGNOSIS — F329 Major depressive disorder, single episode, unspecified: Secondary | ICD-10-CM | POA: Diagnosis not present

## 2014-01-13 DIAGNOSIS — I10 Essential (primary) hypertension: Secondary | ICD-10-CM | POA: Diagnosis not present

## 2014-01-13 DIAGNOSIS — F329 Major depressive disorder, single episode, unspecified: Secondary | ICD-10-CM | POA: Diagnosis not present

## 2014-01-13 DIAGNOSIS — I890 Lymphedema, not elsewhere classified: Secondary | ICD-10-CM | POA: Diagnosis not present

## 2014-01-13 DIAGNOSIS — I4891 Unspecified atrial fibrillation: Secondary | ICD-10-CM | POA: Diagnosis not present

## 2014-01-13 DIAGNOSIS — M4120 Other idiopathic scoliosis, site unspecified: Secondary | ICD-10-CM | POA: Diagnosis not present

## 2014-01-15 DIAGNOSIS — I890 Lymphedema, not elsewhere classified: Secondary | ICD-10-CM | POA: Diagnosis not present

## 2014-01-15 DIAGNOSIS — I4891 Unspecified atrial fibrillation: Secondary | ICD-10-CM | POA: Diagnosis not present

## 2014-01-15 DIAGNOSIS — I10 Essential (primary) hypertension: Secondary | ICD-10-CM | POA: Diagnosis not present

## 2014-01-15 DIAGNOSIS — M4120 Other idiopathic scoliosis, site unspecified: Secondary | ICD-10-CM | POA: Diagnosis not present

## 2014-01-15 DIAGNOSIS — F329 Major depressive disorder, single episode, unspecified: Secondary | ICD-10-CM | POA: Diagnosis not present

## 2014-01-17 DIAGNOSIS — I890 Lymphedema, not elsewhere classified: Secondary | ICD-10-CM | POA: Diagnosis not present

## 2014-01-17 DIAGNOSIS — I4891 Unspecified atrial fibrillation: Secondary | ICD-10-CM | POA: Diagnosis not present

## 2014-01-17 DIAGNOSIS — F329 Major depressive disorder, single episode, unspecified: Secondary | ICD-10-CM | POA: Diagnosis not present

## 2014-01-17 DIAGNOSIS — M4120 Other idiopathic scoliosis, site unspecified: Secondary | ICD-10-CM | POA: Diagnosis not present

## 2014-01-17 DIAGNOSIS — I10 Essential (primary) hypertension: Secondary | ICD-10-CM | POA: Diagnosis not present

## 2014-01-20 DIAGNOSIS — M4120 Other idiopathic scoliosis, site unspecified: Secondary | ICD-10-CM | POA: Diagnosis not present

## 2014-01-20 DIAGNOSIS — I890 Lymphedema, not elsewhere classified: Secondary | ICD-10-CM | POA: Diagnosis not present

## 2014-01-20 DIAGNOSIS — F329 Major depressive disorder, single episode, unspecified: Secondary | ICD-10-CM | POA: Diagnosis not present

## 2014-01-20 DIAGNOSIS — I4891 Unspecified atrial fibrillation: Secondary | ICD-10-CM | POA: Diagnosis not present

## 2014-01-20 DIAGNOSIS — I10 Essential (primary) hypertension: Secondary | ICD-10-CM | POA: Diagnosis not present

## 2014-01-26 DIAGNOSIS — I4891 Unspecified atrial fibrillation: Secondary | ICD-10-CM | POA: Diagnosis not present

## 2014-01-26 DIAGNOSIS — I10 Essential (primary) hypertension: Secondary | ICD-10-CM | POA: Diagnosis not present

## 2014-01-26 DIAGNOSIS — I890 Lymphedema, not elsewhere classified: Secondary | ICD-10-CM | POA: Diagnosis not present

## 2014-01-26 DIAGNOSIS — F329 Major depressive disorder, single episode, unspecified: Secondary | ICD-10-CM | POA: Diagnosis not present

## 2014-01-26 DIAGNOSIS — M4120 Other idiopathic scoliosis, site unspecified: Secondary | ICD-10-CM | POA: Diagnosis not present

## 2014-01-28 DIAGNOSIS — I890 Lymphedema, not elsewhere classified: Secondary | ICD-10-CM | POA: Diagnosis not present

## 2014-01-28 DIAGNOSIS — L84 Corns and callosities: Secondary | ICD-10-CM | POA: Diagnosis not present

## 2014-01-28 DIAGNOSIS — I10 Essential (primary) hypertension: Secondary | ICD-10-CM | POA: Diagnosis not present

## 2014-01-28 DIAGNOSIS — M4120 Other idiopathic scoliosis, site unspecified: Secondary | ICD-10-CM | POA: Diagnosis not present

## 2014-01-28 DIAGNOSIS — F329 Major depressive disorder, single episode, unspecified: Secondary | ICD-10-CM | POA: Diagnosis not present

## 2014-01-28 DIAGNOSIS — L608 Other nail disorders: Secondary | ICD-10-CM | POA: Diagnosis not present

## 2014-01-28 DIAGNOSIS — I739 Peripheral vascular disease, unspecified: Secondary | ICD-10-CM | POA: Diagnosis not present

## 2014-01-28 DIAGNOSIS — I4891 Unspecified atrial fibrillation: Secondary | ICD-10-CM | POA: Diagnosis not present

## 2014-02-01 DIAGNOSIS — I10 Essential (primary) hypertension: Secondary | ICD-10-CM | POA: Diagnosis not present

## 2014-02-01 DIAGNOSIS — I4891 Unspecified atrial fibrillation: Secondary | ICD-10-CM | POA: Diagnosis not present

## 2014-02-01 DIAGNOSIS — M4120 Other idiopathic scoliosis, site unspecified: Secondary | ICD-10-CM | POA: Diagnosis not present

## 2014-02-01 DIAGNOSIS — F329 Major depressive disorder, single episode, unspecified: Secondary | ICD-10-CM | POA: Diagnosis not present

## 2014-02-01 DIAGNOSIS — I890 Lymphedema, not elsewhere classified: Secondary | ICD-10-CM | POA: Diagnosis not present

## 2014-02-02 DIAGNOSIS — Z23 Encounter for immunization: Secondary | ICD-10-CM | POA: Diagnosis not present

## 2014-02-02 DIAGNOSIS — I4891 Unspecified atrial fibrillation: Secondary | ICD-10-CM | POA: Diagnosis not present

## 2014-02-02 DIAGNOSIS — I890 Lymphedema, not elsewhere classified: Secondary | ICD-10-CM | POA: Diagnosis not present

## 2014-02-02 DIAGNOSIS — F329 Major depressive disorder, single episode, unspecified: Secondary | ICD-10-CM | POA: Diagnosis not present

## 2014-02-02 DIAGNOSIS — I10 Essential (primary) hypertension: Secondary | ICD-10-CM | POA: Diagnosis not present

## 2014-02-02 DIAGNOSIS — M4120 Other idiopathic scoliosis, site unspecified: Secondary | ICD-10-CM | POA: Diagnosis not present

## 2014-02-03 DIAGNOSIS — F329 Major depressive disorder, single episode, unspecified: Secondary | ICD-10-CM | POA: Diagnosis not present

## 2014-02-03 DIAGNOSIS — I4891 Unspecified atrial fibrillation: Secondary | ICD-10-CM | POA: Diagnosis not present

## 2014-02-03 DIAGNOSIS — N949 Unspecified condition associated with female genital organs and menstrual cycle: Secondary | ICD-10-CM | POA: Diagnosis not present

## 2014-02-03 DIAGNOSIS — Z01419 Encounter for gynecological examination (general) (routine) without abnormal findings: Secondary | ICD-10-CM | POA: Diagnosis not present

## 2014-02-03 DIAGNOSIS — I10 Essential (primary) hypertension: Secondary | ICD-10-CM | POA: Diagnosis not present

## 2014-02-03 DIAGNOSIS — M4120 Other idiopathic scoliosis, site unspecified: Secondary | ICD-10-CM | POA: Diagnosis not present

## 2014-02-03 DIAGNOSIS — I890 Lymphedema, not elsewhere classified: Secondary | ICD-10-CM | POA: Diagnosis not present

## 2014-02-04 ENCOUNTER — Other Ambulatory Visit: Payer: Self-pay | Admitting: Obstetrics and Gynecology

## 2014-02-04 DIAGNOSIS — Z853 Personal history of malignant neoplasm of breast: Secondary | ICD-10-CM

## 2014-02-05 DIAGNOSIS — I10 Essential (primary) hypertension: Secondary | ICD-10-CM | POA: Diagnosis not present

## 2014-02-05 DIAGNOSIS — M4120 Other idiopathic scoliosis, site unspecified: Secondary | ICD-10-CM | POA: Diagnosis not present

## 2014-02-05 DIAGNOSIS — I890 Lymphedema, not elsewhere classified: Secondary | ICD-10-CM | POA: Diagnosis not present

## 2014-02-05 DIAGNOSIS — F329 Major depressive disorder, single episode, unspecified: Secondary | ICD-10-CM | POA: Diagnosis not present

## 2014-02-05 DIAGNOSIS — I4891 Unspecified atrial fibrillation: Secondary | ICD-10-CM | POA: Diagnosis not present

## 2014-02-08 DIAGNOSIS — I10 Essential (primary) hypertension: Secondary | ICD-10-CM | POA: Diagnosis not present

## 2014-02-08 DIAGNOSIS — I890 Lymphedema, not elsewhere classified: Secondary | ICD-10-CM | POA: Diagnosis not present

## 2014-02-08 DIAGNOSIS — I4891 Unspecified atrial fibrillation: Secondary | ICD-10-CM | POA: Diagnosis not present

## 2014-02-08 DIAGNOSIS — M4120 Other idiopathic scoliosis, site unspecified: Secondary | ICD-10-CM | POA: Diagnosis not present

## 2014-02-08 DIAGNOSIS — F329 Major depressive disorder, single episode, unspecified: Secondary | ICD-10-CM | POA: Diagnosis not present

## 2014-02-09 DIAGNOSIS — M419 Scoliosis, unspecified: Secondary | ICD-10-CM | POA: Diagnosis not present

## 2014-02-09 DIAGNOSIS — M797 Fibromyalgia: Secondary | ICD-10-CM | POA: Diagnosis not present

## 2014-02-09 DIAGNOSIS — I4891 Unspecified atrial fibrillation: Secondary | ICD-10-CM | POA: Diagnosis not present

## 2014-02-09 DIAGNOSIS — R279 Unspecified lack of coordination: Secondary | ICD-10-CM | POA: Diagnosis not present

## 2014-02-09 DIAGNOSIS — F329 Major depressive disorder, single episode, unspecified: Secondary | ICD-10-CM | POA: Diagnosis not present

## 2014-02-09 DIAGNOSIS — I10 Essential (primary) hypertension: Secondary | ICD-10-CM | POA: Diagnosis not present

## 2014-02-09 DIAGNOSIS — M6281 Muscle weakness (generalized): Secondary | ICD-10-CM | POA: Diagnosis not present

## 2014-02-09 DIAGNOSIS — I890 Lymphedema, not elsewhere classified: Secondary | ICD-10-CM | POA: Diagnosis not present

## 2014-02-10 DIAGNOSIS — N949 Unspecified condition associated with female genital organs and menstrual cycle: Secondary | ICD-10-CM | POA: Diagnosis not present

## 2014-02-11 DIAGNOSIS — I10 Essential (primary) hypertension: Secondary | ICD-10-CM | POA: Diagnosis not present

## 2014-02-11 DIAGNOSIS — F329 Major depressive disorder, single episode, unspecified: Secondary | ICD-10-CM | POA: Diagnosis not present

## 2014-02-11 DIAGNOSIS — I890 Lymphedema, not elsewhere classified: Secondary | ICD-10-CM | POA: Diagnosis not present

## 2014-02-11 DIAGNOSIS — I4891 Unspecified atrial fibrillation: Secondary | ICD-10-CM | POA: Diagnosis not present

## 2014-02-11 DIAGNOSIS — M419 Scoliosis, unspecified: Secondary | ICD-10-CM | POA: Diagnosis not present

## 2014-02-11 DIAGNOSIS — M797 Fibromyalgia: Secondary | ICD-10-CM | POA: Diagnosis not present

## 2014-02-12 DIAGNOSIS — I890 Lymphedema, not elsewhere classified: Secondary | ICD-10-CM | POA: Diagnosis not present

## 2014-02-12 DIAGNOSIS — F329 Major depressive disorder, single episode, unspecified: Secondary | ICD-10-CM | POA: Diagnosis not present

## 2014-02-12 DIAGNOSIS — I10 Essential (primary) hypertension: Secondary | ICD-10-CM | POA: Diagnosis not present

## 2014-02-12 DIAGNOSIS — I4891 Unspecified atrial fibrillation: Secondary | ICD-10-CM | POA: Diagnosis not present

## 2014-02-12 DIAGNOSIS — M797 Fibromyalgia: Secondary | ICD-10-CM | POA: Diagnosis not present

## 2014-02-12 DIAGNOSIS — M419 Scoliosis, unspecified: Secondary | ICD-10-CM | POA: Diagnosis not present

## 2014-02-15 ENCOUNTER — Ambulatory Visit
Admission: RE | Admit: 2014-02-15 | Discharge: 2014-02-15 | Disposition: A | Discharge status: Home / Self Care | Payer: Medicare Other | Source: Ambulatory Visit | Attending: Obstetrics and Gynecology | Admitting: Obstetrics and Gynecology

## 2014-02-15 DIAGNOSIS — I890 Lymphedema, not elsewhere classified: Secondary | ICD-10-CM | POA: Diagnosis not present

## 2014-02-15 DIAGNOSIS — M797 Fibromyalgia: Secondary | ICD-10-CM | POA: Diagnosis not present

## 2014-02-15 DIAGNOSIS — Z853 Personal history of malignant neoplasm of breast: Secondary | ICD-10-CM

## 2014-02-15 DIAGNOSIS — I10 Essential (primary) hypertension: Secondary | ICD-10-CM | POA: Diagnosis not present

## 2014-02-15 DIAGNOSIS — I4891 Unspecified atrial fibrillation: Secondary | ICD-10-CM | POA: Diagnosis not present

## 2014-02-15 DIAGNOSIS — F329 Major depressive disorder, single episode, unspecified: Secondary | ICD-10-CM | POA: Diagnosis not present

## 2014-02-15 DIAGNOSIS — M419 Scoliosis, unspecified: Secondary | ICD-10-CM | POA: Diagnosis not present

## 2014-02-15 DIAGNOSIS — R922 Inconclusive mammogram: Secondary | ICD-10-CM | POA: Diagnosis not present

## 2014-02-17 DIAGNOSIS — I10 Essential (primary) hypertension: Secondary | ICD-10-CM | POA: Diagnosis not present

## 2014-02-17 DIAGNOSIS — I4891 Unspecified atrial fibrillation: Secondary | ICD-10-CM | POA: Diagnosis not present

## 2014-02-17 DIAGNOSIS — I890 Lymphedema, not elsewhere classified: Secondary | ICD-10-CM | POA: Diagnosis not present

## 2014-02-17 DIAGNOSIS — M419 Scoliosis, unspecified: Secondary | ICD-10-CM | POA: Diagnosis not present

## 2014-02-17 DIAGNOSIS — M797 Fibromyalgia: Secondary | ICD-10-CM | POA: Diagnosis not present

## 2014-02-17 DIAGNOSIS — F329 Major depressive disorder, single episode, unspecified: Secondary | ICD-10-CM | POA: Diagnosis not present

## 2014-02-18 DIAGNOSIS — M419 Scoliosis, unspecified: Secondary | ICD-10-CM | POA: Diagnosis not present

## 2014-02-18 DIAGNOSIS — I10 Essential (primary) hypertension: Secondary | ICD-10-CM | POA: Diagnosis not present

## 2014-02-18 DIAGNOSIS — I890 Lymphedema, not elsewhere classified: Secondary | ICD-10-CM | POA: Diagnosis not present

## 2014-02-18 DIAGNOSIS — F329 Major depressive disorder, single episode, unspecified: Secondary | ICD-10-CM | POA: Diagnosis not present

## 2014-02-18 DIAGNOSIS — I4891 Unspecified atrial fibrillation: Secondary | ICD-10-CM | POA: Diagnosis not present

## 2014-02-18 DIAGNOSIS — M797 Fibromyalgia: Secondary | ICD-10-CM | POA: Diagnosis not present

## 2014-02-19 DIAGNOSIS — F329 Major depressive disorder, single episode, unspecified: Secondary | ICD-10-CM | POA: Diagnosis not present

## 2014-02-19 DIAGNOSIS — I10 Essential (primary) hypertension: Secondary | ICD-10-CM | POA: Diagnosis not present

## 2014-02-19 DIAGNOSIS — I4891 Unspecified atrial fibrillation: Secondary | ICD-10-CM | POA: Diagnosis not present

## 2014-02-19 DIAGNOSIS — I890 Lymphedema, not elsewhere classified: Secondary | ICD-10-CM | POA: Diagnosis not present

## 2014-02-19 DIAGNOSIS — M419 Scoliosis, unspecified: Secondary | ICD-10-CM | POA: Diagnosis not present

## 2014-02-19 DIAGNOSIS — M797 Fibromyalgia: Secondary | ICD-10-CM | POA: Diagnosis not present

## 2014-02-23 DIAGNOSIS — I10 Essential (primary) hypertension: Secondary | ICD-10-CM | POA: Diagnosis not present

## 2014-02-23 DIAGNOSIS — F329 Major depressive disorder, single episode, unspecified: Secondary | ICD-10-CM | POA: Diagnosis not present

## 2014-02-23 DIAGNOSIS — G894 Chronic pain syndrome: Secondary | ICD-10-CM | POA: Diagnosis not present

## 2014-02-23 DIAGNOSIS — M5416 Radiculopathy, lumbar region: Secondary | ICD-10-CM | POA: Diagnosis not present

## 2014-02-23 DIAGNOSIS — M797 Fibromyalgia: Secondary | ICD-10-CM | POA: Diagnosis not present

## 2014-02-23 DIAGNOSIS — I890 Lymphedema, not elsewhere classified: Secondary | ICD-10-CM | POA: Diagnosis not present

## 2014-02-23 DIAGNOSIS — I4891 Unspecified atrial fibrillation: Secondary | ICD-10-CM | POA: Diagnosis not present

## 2014-02-23 DIAGNOSIS — M419 Scoliosis, unspecified: Secondary | ICD-10-CM | POA: Diagnosis not present

## 2014-02-23 DIAGNOSIS — M461 Sacroiliitis, not elsewhere classified: Secondary | ICD-10-CM | POA: Diagnosis not present

## 2014-02-23 DIAGNOSIS — M47812 Spondylosis without myelopathy or radiculopathy, cervical region: Secondary | ICD-10-CM | POA: Diagnosis not present

## 2014-02-25 DIAGNOSIS — I890 Lymphedema, not elsewhere classified: Secondary | ICD-10-CM | POA: Diagnosis not present

## 2014-02-25 DIAGNOSIS — M797 Fibromyalgia: Secondary | ICD-10-CM | POA: Diagnosis not present

## 2014-02-25 DIAGNOSIS — I10 Essential (primary) hypertension: Secondary | ICD-10-CM | POA: Diagnosis not present

## 2014-02-25 DIAGNOSIS — F329 Major depressive disorder, single episode, unspecified: Secondary | ICD-10-CM | POA: Diagnosis not present

## 2014-02-25 DIAGNOSIS — M419 Scoliosis, unspecified: Secondary | ICD-10-CM | POA: Diagnosis not present

## 2014-02-25 DIAGNOSIS — I4891 Unspecified atrial fibrillation: Secondary | ICD-10-CM | POA: Diagnosis not present

## 2014-03-01 DIAGNOSIS — M797 Fibromyalgia: Secondary | ICD-10-CM | POA: Diagnosis not present

## 2014-03-01 DIAGNOSIS — I890 Lymphedema, not elsewhere classified: Secondary | ICD-10-CM | POA: Diagnosis not present

## 2014-03-01 DIAGNOSIS — F329 Major depressive disorder, single episode, unspecified: Secondary | ICD-10-CM | POA: Diagnosis not present

## 2014-03-01 DIAGNOSIS — I4891 Unspecified atrial fibrillation: Secondary | ICD-10-CM | POA: Diagnosis not present

## 2014-03-01 DIAGNOSIS — I10 Essential (primary) hypertension: Secondary | ICD-10-CM | POA: Diagnosis not present

## 2014-03-01 DIAGNOSIS — M419 Scoliosis, unspecified: Secondary | ICD-10-CM | POA: Diagnosis not present

## 2014-03-02 DIAGNOSIS — M797 Fibromyalgia: Secondary | ICD-10-CM | POA: Diagnosis not present

## 2014-03-02 DIAGNOSIS — I4891 Unspecified atrial fibrillation: Secondary | ICD-10-CM | POA: Diagnosis not present

## 2014-03-02 DIAGNOSIS — F329 Major depressive disorder, single episode, unspecified: Secondary | ICD-10-CM | POA: Diagnosis not present

## 2014-03-02 DIAGNOSIS — M419 Scoliosis, unspecified: Secondary | ICD-10-CM | POA: Diagnosis not present

## 2014-03-02 DIAGNOSIS — I890 Lymphedema, not elsewhere classified: Secondary | ICD-10-CM | POA: Diagnosis not present

## 2014-03-02 DIAGNOSIS — I10 Essential (primary) hypertension: Secondary | ICD-10-CM | POA: Diagnosis not present

## 2014-03-04 DIAGNOSIS — M797 Fibromyalgia: Secondary | ICD-10-CM | POA: Diagnosis not present

## 2014-03-04 DIAGNOSIS — I890 Lymphedema, not elsewhere classified: Secondary | ICD-10-CM | POA: Diagnosis not present

## 2014-03-04 DIAGNOSIS — F329 Major depressive disorder, single episode, unspecified: Secondary | ICD-10-CM | POA: Diagnosis not present

## 2014-03-04 DIAGNOSIS — I10 Essential (primary) hypertension: Secondary | ICD-10-CM | POA: Diagnosis not present

## 2014-03-04 DIAGNOSIS — I4891 Unspecified atrial fibrillation: Secondary | ICD-10-CM | POA: Diagnosis not present

## 2014-03-04 DIAGNOSIS — M419 Scoliosis, unspecified: Secondary | ICD-10-CM | POA: Diagnosis not present

## 2014-03-08 DIAGNOSIS — M797 Fibromyalgia: Secondary | ICD-10-CM | POA: Diagnosis not present

## 2014-03-08 DIAGNOSIS — F329 Major depressive disorder, single episode, unspecified: Secondary | ICD-10-CM | POA: Diagnosis not present

## 2014-03-08 DIAGNOSIS — I890 Lymphedema, not elsewhere classified: Secondary | ICD-10-CM | POA: Diagnosis not present

## 2014-03-08 DIAGNOSIS — M419 Scoliosis, unspecified: Secondary | ICD-10-CM | POA: Diagnosis not present

## 2014-03-08 DIAGNOSIS — I10 Essential (primary) hypertension: Secondary | ICD-10-CM | POA: Diagnosis not present

## 2014-03-08 DIAGNOSIS — I4891 Unspecified atrial fibrillation: Secondary | ICD-10-CM | POA: Diagnosis not present

## 2014-03-10 DIAGNOSIS — I10 Essential (primary) hypertension: Secondary | ICD-10-CM | POA: Diagnosis not present

## 2014-03-10 DIAGNOSIS — I890 Lymphedema, not elsewhere classified: Secondary | ICD-10-CM | POA: Diagnosis not present

## 2014-03-10 DIAGNOSIS — F329 Major depressive disorder, single episode, unspecified: Secondary | ICD-10-CM | POA: Diagnosis not present

## 2014-03-10 DIAGNOSIS — M797 Fibromyalgia: Secondary | ICD-10-CM | POA: Diagnosis not present

## 2014-03-10 DIAGNOSIS — M419 Scoliosis, unspecified: Secondary | ICD-10-CM | POA: Diagnosis not present

## 2014-03-10 DIAGNOSIS — I4891 Unspecified atrial fibrillation: Secondary | ICD-10-CM | POA: Diagnosis not present

## 2014-03-11 DIAGNOSIS — I10 Essential (primary) hypertension: Secondary | ICD-10-CM | POA: Diagnosis not present

## 2014-03-11 DIAGNOSIS — M797 Fibromyalgia: Secondary | ICD-10-CM | POA: Diagnosis not present

## 2014-03-11 DIAGNOSIS — F329 Major depressive disorder, single episode, unspecified: Secondary | ICD-10-CM | POA: Diagnosis not present

## 2014-03-11 DIAGNOSIS — M419 Scoliosis, unspecified: Secondary | ICD-10-CM | POA: Diagnosis not present

## 2014-03-11 DIAGNOSIS — I890 Lymphedema, not elsewhere classified: Secondary | ICD-10-CM | POA: Diagnosis not present

## 2014-03-11 DIAGNOSIS — I4891 Unspecified atrial fibrillation: Secondary | ICD-10-CM | POA: Diagnosis not present

## 2014-03-15 DIAGNOSIS — M797 Fibromyalgia: Secondary | ICD-10-CM | POA: Diagnosis not present

## 2014-03-15 DIAGNOSIS — F329 Major depressive disorder, single episode, unspecified: Secondary | ICD-10-CM | POA: Diagnosis not present

## 2014-03-15 DIAGNOSIS — I4891 Unspecified atrial fibrillation: Secondary | ICD-10-CM | POA: Diagnosis not present

## 2014-03-15 DIAGNOSIS — I890 Lymphedema, not elsewhere classified: Secondary | ICD-10-CM | POA: Diagnosis not present

## 2014-03-15 DIAGNOSIS — M419 Scoliosis, unspecified: Secondary | ICD-10-CM | POA: Diagnosis not present

## 2014-03-15 DIAGNOSIS — I10 Essential (primary) hypertension: Secondary | ICD-10-CM | POA: Diagnosis not present

## 2014-03-17 DIAGNOSIS — M797 Fibromyalgia: Secondary | ICD-10-CM | POA: Diagnosis not present

## 2014-03-17 DIAGNOSIS — I10 Essential (primary) hypertension: Secondary | ICD-10-CM | POA: Diagnosis not present

## 2014-03-17 DIAGNOSIS — F329 Major depressive disorder, single episode, unspecified: Secondary | ICD-10-CM | POA: Diagnosis not present

## 2014-03-17 DIAGNOSIS — I890 Lymphedema, not elsewhere classified: Secondary | ICD-10-CM | POA: Diagnosis not present

## 2014-03-17 DIAGNOSIS — M419 Scoliosis, unspecified: Secondary | ICD-10-CM | POA: Diagnosis not present

## 2014-03-17 DIAGNOSIS — I4891 Unspecified atrial fibrillation: Secondary | ICD-10-CM | POA: Diagnosis not present

## 2014-03-18 DIAGNOSIS — I890 Lymphedema, not elsewhere classified: Secondary | ICD-10-CM | POA: Diagnosis not present

## 2014-03-18 DIAGNOSIS — M419 Scoliosis, unspecified: Secondary | ICD-10-CM | POA: Diagnosis not present

## 2014-03-18 DIAGNOSIS — I10 Essential (primary) hypertension: Secondary | ICD-10-CM | POA: Diagnosis not present

## 2014-03-18 DIAGNOSIS — F329 Major depressive disorder, single episode, unspecified: Secondary | ICD-10-CM | POA: Diagnosis not present

## 2014-03-18 DIAGNOSIS — M797 Fibromyalgia: Secondary | ICD-10-CM | POA: Diagnosis not present

## 2014-03-18 DIAGNOSIS — I4891 Unspecified atrial fibrillation: Secondary | ICD-10-CM | POA: Diagnosis not present

## 2014-03-23 DIAGNOSIS — F329 Major depressive disorder, single episode, unspecified: Secondary | ICD-10-CM | POA: Diagnosis not present

## 2014-03-23 DIAGNOSIS — I10 Essential (primary) hypertension: Secondary | ICD-10-CM | POA: Diagnosis not present

## 2014-03-23 DIAGNOSIS — I890 Lymphedema, not elsewhere classified: Secondary | ICD-10-CM | POA: Diagnosis not present

## 2014-03-23 DIAGNOSIS — M797 Fibromyalgia: Secondary | ICD-10-CM | POA: Diagnosis not present

## 2014-03-23 DIAGNOSIS — M419 Scoliosis, unspecified: Secondary | ICD-10-CM | POA: Diagnosis not present

## 2014-03-23 DIAGNOSIS — I4891 Unspecified atrial fibrillation: Secondary | ICD-10-CM | POA: Diagnosis not present

## 2014-03-24 DIAGNOSIS — I10 Essential (primary) hypertension: Secondary | ICD-10-CM | POA: Diagnosis not present

## 2014-03-24 DIAGNOSIS — I4891 Unspecified atrial fibrillation: Secondary | ICD-10-CM | POA: Diagnosis not present

## 2014-03-24 DIAGNOSIS — I890 Lymphedema, not elsewhere classified: Secondary | ICD-10-CM | POA: Diagnosis not present

## 2014-03-24 DIAGNOSIS — M797 Fibromyalgia: Secondary | ICD-10-CM | POA: Diagnosis not present

## 2014-03-24 DIAGNOSIS — M419 Scoliosis, unspecified: Secondary | ICD-10-CM | POA: Diagnosis not present

## 2014-03-24 DIAGNOSIS — F329 Major depressive disorder, single episode, unspecified: Secondary | ICD-10-CM | POA: Diagnosis not present

## 2014-03-25 DIAGNOSIS — M419 Scoliosis, unspecified: Secondary | ICD-10-CM | POA: Diagnosis not present

## 2014-03-25 DIAGNOSIS — I890 Lymphedema, not elsewhere classified: Secondary | ICD-10-CM | POA: Diagnosis not present

## 2014-03-25 DIAGNOSIS — I4891 Unspecified atrial fibrillation: Secondary | ICD-10-CM | POA: Diagnosis not present

## 2014-03-25 DIAGNOSIS — F329 Major depressive disorder, single episode, unspecified: Secondary | ICD-10-CM | POA: Diagnosis not present

## 2014-03-25 DIAGNOSIS — M797 Fibromyalgia: Secondary | ICD-10-CM | POA: Diagnosis not present

## 2014-03-25 DIAGNOSIS — I10 Essential (primary) hypertension: Secondary | ICD-10-CM | POA: Diagnosis not present

## 2014-03-29 DIAGNOSIS — I10 Essential (primary) hypertension: Secondary | ICD-10-CM | POA: Diagnosis not present

## 2014-03-29 DIAGNOSIS — M797 Fibromyalgia: Secondary | ICD-10-CM | POA: Diagnosis not present

## 2014-03-29 DIAGNOSIS — F329 Major depressive disorder, single episode, unspecified: Secondary | ICD-10-CM | POA: Diagnosis not present

## 2014-03-29 DIAGNOSIS — I890 Lymphedema, not elsewhere classified: Secondary | ICD-10-CM | POA: Diagnosis not present

## 2014-03-29 DIAGNOSIS — I4891 Unspecified atrial fibrillation: Secondary | ICD-10-CM | POA: Diagnosis not present

## 2014-03-29 DIAGNOSIS — M419 Scoliosis, unspecified: Secondary | ICD-10-CM | POA: Diagnosis not present

## 2014-03-30 DIAGNOSIS — I4891 Unspecified atrial fibrillation: Secondary | ICD-10-CM | POA: Diagnosis not present

## 2014-03-30 DIAGNOSIS — I890 Lymphedema, not elsewhere classified: Secondary | ICD-10-CM | POA: Diagnosis not present

## 2014-03-30 DIAGNOSIS — F329 Major depressive disorder, single episode, unspecified: Secondary | ICD-10-CM | POA: Diagnosis not present

## 2014-03-30 DIAGNOSIS — M797 Fibromyalgia: Secondary | ICD-10-CM | POA: Diagnosis not present

## 2014-03-30 DIAGNOSIS — M419 Scoliosis, unspecified: Secondary | ICD-10-CM | POA: Diagnosis not present

## 2014-03-30 DIAGNOSIS — I10 Essential (primary) hypertension: Secondary | ICD-10-CM | POA: Diagnosis not present

## 2014-04-01 DIAGNOSIS — I890 Lymphedema, not elsewhere classified: Secondary | ICD-10-CM | POA: Diagnosis not present

## 2014-04-01 DIAGNOSIS — M419 Scoliosis, unspecified: Secondary | ICD-10-CM | POA: Diagnosis not present

## 2014-04-01 DIAGNOSIS — I10 Essential (primary) hypertension: Secondary | ICD-10-CM | POA: Diagnosis not present

## 2014-04-01 DIAGNOSIS — I4891 Unspecified atrial fibrillation: Secondary | ICD-10-CM | POA: Diagnosis not present

## 2014-04-01 DIAGNOSIS — F329 Major depressive disorder, single episode, unspecified: Secondary | ICD-10-CM | POA: Diagnosis not present

## 2014-04-01 DIAGNOSIS — M797 Fibromyalgia: Secondary | ICD-10-CM | POA: Diagnosis not present

## 2014-04-05 DIAGNOSIS — E782 Mixed hyperlipidemia: Secondary | ICD-10-CM | POA: Diagnosis not present

## 2014-04-05 DIAGNOSIS — F329 Major depressive disorder, single episode, unspecified: Secondary | ICD-10-CM | POA: Diagnosis not present

## 2014-04-05 DIAGNOSIS — I119 Hypertensive heart disease without heart failure: Secondary | ICD-10-CM | POA: Diagnosis not present

## 2014-04-05 DIAGNOSIS — C50919 Malignant neoplasm of unspecified site of unspecified female breast: Secondary | ICD-10-CM | POA: Diagnosis not present

## 2014-04-05 DIAGNOSIS — E039 Hypothyroidism, unspecified: Secondary | ICD-10-CM | POA: Diagnosis not present

## 2014-04-05 DIAGNOSIS — I890 Lymphedema, not elsewhere classified: Secondary | ICD-10-CM | POA: Diagnosis not present

## 2014-04-05 DIAGNOSIS — M797 Fibromyalgia: Secondary | ICD-10-CM | POA: Diagnosis not present

## 2014-04-05 DIAGNOSIS — I4891 Unspecified atrial fibrillation: Secondary | ICD-10-CM | POA: Diagnosis not present

## 2014-04-07 DIAGNOSIS — I10 Essential (primary) hypertension: Secondary | ICD-10-CM | POA: Diagnosis not present

## 2014-04-07 DIAGNOSIS — F329 Major depressive disorder, single episode, unspecified: Secondary | ICD-10-CM | POA: Diagnosis not present

## 2014-04-07 DIAGNOSIS — I890 Lymphedema, not elsewhere classified: Secondary | ICD-10-CM | POA: Diagnosis not present

## 2014-04-07 DIAGNOSIS — M419 Scoliosis, unspecified: Secondary | ICD-10-CM | POA: Diagnosis not present

## 2014-04-07 DIAGNOSIS — M797 Fibromyalgia: Secondary | ICD-10-CM | POA: Diagnosis not present

## 2014-04-07 DIAGNOSIS — I4891 Unspecified atrial fibrillation: Secondary | ICD-10-CM | POA: Diagnosis not present

## 2014-04-13 DIAGNOSIS — F339 Major depressive disorder, recurrent, unspecified: Secondary | ICD-10-CM | POA: Diagnosis not present

## 2014-04-14 DIAGNOSIS — I10 Essential (primary) hypertension: Secondary | ICD-10-CM | POA: Diagnosis not present

## 2014-04-14 DIAGNOSIS — M797 Fibromyalgia: Secondary | ICD-10-CM | POA: Diagnosis not present

## 2014-04-14 DIAGNOSIS — Z8781 Personal history of (healed) traumatic fracture: Secondary | ICD-10-CM | POA: Diagnosis not present

## 2014-04-14 DIAGNOSIS — I4891 Unspecified atrial fibrillation: Secondary | ICD-10-CM | POA: Diagnosis not present

## 2014-04-14 DIAGNOSIS — F329 Major depressive disorder, single episode, unspecified: Secondary | ICD-10-CM | POA: Diagnosis not present

## 2014-04-14 DIAGNOSIS — I872 Venous insufficiency (chronic) (peripheral): Secondary | ICD-10-CM | POA: Diagnosis not present

## 2014-04-14 DIAGNOSIS — L97811 Non-pressure chronic ulcer of other part of right lower leg limited to breakdown of skin: Secondary | ICD-10-CM | POA: Diagnosis not present

## 2014-04-14 DIAGNOSIS — I890 Lymphedema, not elsewhere classified: Secondary | ICD-10-CM | POA: Diagnosis not present

## 2014-04-21 DIAGNOSIS — M5416 Radiculopathy, lumbar region: Secondary | ICD-10-CM | POA: Diagnosis not present

## 2014-04-21 DIAGNOSIS — M461 Sacroiliitis, not elsewhere classified: Secondary | ICD-10-CM | POA: Diagnosis not present

## 2014-04-21 DIAGNOSIS — G894 Chronic pain syndrome: Secondary | ICD-10-CM | POA: Diagnosis not present

## 2014-04-21 DIAGNOSIS — M47812 Spondylosis without myelopathy or radiculopathy, cervical region: Secondary | ICD-10-CM | POA: Diagnosis not present

## 2014-04-22 DIAGNOSIS — I739 Peripheral vascular disease, unspecified: Secondary | ICD-10-CM | POA: Diagnosis not present

## 2014-04-22 DIAGNOSIS — L603 Nail dystrophy: Secondary | ICD-10-CM | POA: Diagnosis not present

## 2014-04-22 DIAGNOSIS — L84 Corns and callosities: Secondary | ICD-10-CM | POA: Diagnosis not present

## 2014-06-01 DIAGNOSIS — F339 Major depressive disorder, recurrent, unspecified: Secondary | ICD-10-CM | POA: Diagnosis not present

## 2014-06-13 DIAGNOSIS — I890 Lymphedema, not elsewhere classified: Secondary | ICD-10-CM | POA: Diagnosis not present

## 2014-06-13 DIAGNOSIS — Z8781 Personal history of (healed) traumatic fracture: Secondary | ICD-10-CM | POA: Diagnosis not present

## 2014-06-13 DIAGNOSIS — F329 Major depressive disorder, single episode, unspecified: Secondary | ICD-10-CM | POA: Diagnosis not present

## 2014-06-13 DIAGNOSIS — Z7901 Long term (current) use of anticoagulants: Secondary | ICD-10-CM | POA: Diagnosis not present

## 2014-06-13 DIAGNOSIS — I4891 Unspecified atrial fibrillation: Secondary | ICD-10-CM | POA: Diagnosis not present

## 2014-06-13 DIAGNOSIS — I872 Venous insufficiency (chronic) (peripheral): Secondary | ICD-10-CM | POA: Diagnosis not present

## 2014-06-13 DIAGNOSIS — Z4800 Encounter for change or removal of nonsurgical wound dressing: Secondary | ICD-10-CM | POA: Diagnosis not present

## 2014-06-13 DIAGNOSIS — I10 Essential (primary) hypertension: Secondary | ICD-10-CM | POA: Diagnosis not present

## 2014-06-13 DIAGNOSIS — L97811 Non-pressure chronic ulcer of other part of right lower leg limited to breakdown of skin: Secondary | ICD-10-CM | POA: Diagnosis not present

## 2014-06-16 DIAGNOSIS — I890 Lymphedema, not elsewhere classified: Secondary | ICD-10-CM | POA: Diagnosis not present

## 2014-06-16 DIAGNOSIS — I4891 Unspecified atrial fibrillation: Secondary | ICD-10-CM | POA: Diagnosis not present

## 2014-06-16 DIAGNOSIS — M47812 Spondylosis without myelopathy or radiculopathy, cervical region: Secondary | ICD-10-CM | POA: Diagnosis not present

## 2014-06-16 DIAGNOSIS — L97811 Non-pressure chronic ulcer of other part of right lower leg limited to breakdown of skin: Secondary | ICD-10-CM | POA: Diagnosis not present

## 2014-06-16 DIAGNOSIS — I10 Essential (primary) hypertension: Secondary | ICD-10-CM | POA: Diagnosis not present

## 2014-06-16 DIAGNOSIS — Z79891 Long term (current) use of opiate analgesic: Secondary | ICD-10-CM | POA: Diagnosis not present

## 2014-06-16 DIAGNOSIS — G894 Chronic pain syndrome: Secondary | ICD-10-CM | POA: Diagnosis not present

## 2014-06-16 DIAGNOSIS — I872 Venous insufficiency (chronic) (peripheral): Secondary | ICD-10-CM | POA: Diagnosis not present

## 2014-06-16 DIAGNOSIS — M5416 Radiculopathy, lumbar region: Secondary | ICD-10-CM | POA: Diagnosis not present

## 2014-06-16 DIAGNOSIS — M461 Sacroiliitis, not elsewhere classified: Secondary | ICD-10-CM | POA: Diagnosis not present

## 2014-06-16 DIAGNOSIS — F329 Major depressive disorder, single episode, unspecified: Secondary | ICD-10-CM | POA: Diagnosis not present

## 2014-06-21 ENCOUNTER — Other Ambulatory Visit: Payer: Self-pay | Admitting: Family Medicine

## 2014-06-21 ENCOUNTER — Ambulatory Visit
Admission: RE | Admit: 2014-06-21 | Discharge: 2014-06-21 | Disposition: A | Discharge status: Home / Self Care | Payer: Medicare Other | Source: Ambulatory Visit | Attending: Family Medicine | Admitting: Family Medicine

## 2014-06-21 DIAGNOSIS — M545 Low back pain: Secondary | ICD-10-CM

## 2014-06-21 DIAGNOSIS — M4186 Other forms of scoliosis, lumbar region: Secondary | ICD-10-CM | POA: Diagnosis not present

## 2014-06-21 DIAGNOSIS — M549 Dorsalgia, unspecified: Secondary | ICD-10-CM | POA: Diagnosis not present

## 2014-06-21 DIAGNOSIS — R5383 Other fatigue: Secondary | ICD-10-CM | POA: Diagnosis not present

## 2014-06-21 DIAGNOSIS — M5136 Other intervertebral disc degeneration, lumbar region: Secondary | ICD-10-CM | POA: Diagnosis not present

## 2014-06-21 DIAGNOSIS — N939 Abnormal uterine and vaginal bleeding, unspecified: Secondary | ICD-10-CM | POA: Diagnosis not present

## 2014-06-23 DIAGNOSIS — I10 Essential (primary) hypertension: Secondary | ICD-10-CM | POA: Diagnosis not present

## 2014-06-23 DIAGNOSIS — I4891 Unspecified atrial fibrillation: Secondary | ICD-10-CM | POA: Diagnosis not present

## 2014-06-23 DIAGNOSIS — I872 Venous insufficiency (chronic) (peripheral): Secondary | ICD-10-CM | POA: Diagnosis not present

## 2014-06-23 DIAGNOSIS — I890 Lymphedema, not elsewhere classified: Secondary | ICD-10-CM | POA: Diagnosis not present

## 2014-06-23 DIAGNOSIS — F329 Major depressive disorder, single episode, unspecified: Secondary | ICD-10-CM | POA: Diagnosis not present

## 2014-06-23 DIAGNOSIS — L97811 Non-pressure chronic ulcer of other part of right lower leg limited to breakdown of skin: Secondary | ICD-10-CM | POA: Diagnosis not present

## 2014-06-30 DIAGNOSIS — F329 Major depressive disorder, single episode, unspecified: Secondary | ICD-10-CM | POA: Diagnosis not present

## 2014-06-30 DIAGNOSIS — I4891 Unspecified atrial fibrillation: Secondary | ICD-10-CM | POA: Diagnosis not present

## 2014-06-30 DIAGNOSIS — R938 Abnormal findings on diagnostic imaging of other specified body structures: Secondary | ICD-10-CM | POA: Diagnosis not present

## 2014-06-30 DIAGNOSIS — N950 Postmenopausal bleeding: Secondary | ICD-10-CM | POA: Diagnosis not present

## 2014-06-30 DIAGNOSIS — I890 Lymphedema, not elsewhere classified: Secondary | ICD-10-CM | POA: Diagnosis not present

## 2014-06-30 DIAGNOSIS — L97811 Non-pressure chronic ulcer of other part of right lower leg limited to breakdown of skin: Secondary | ICD-10-CM | POA: Diagnosis not present

## 2014-06-30 DIAGNOSIS — I10 Essential (primary) hypertension: Secondary | ICD-10-CM | POA: Diagnosis not present

## 2014-06-30 DIAGNOSIS — I872 Venous insufficiency (chronic) (peripheral): Secondary | ICD-10-CM | POA: Diagnosis not present

## 2014-07-01 DIAGNOSIS — M79609 Pain in unspecified limb: Secondary | ICD-10-CM | POA: Diagnosis not present

## 2014-07-01 DIAGNOSIS — I739 Peripheral vascular disease, unspecified: Secondary | ICD-10-CM | POA: Diagnosis not present

## 2014-07-01 DIAGNOSIS — L84 Corns and callosities: Secondary | ICD-10-CM | POA: Diagnosis not present

## 2014-07-01 DIAGNOSIS — B351 Tinea unguium: Secondary | ICD-10-CM | POA: Diagnosis not present

## 2014-07-06 DIAGNOSIS — F339 Major depressive disorder, recurrent, unspecified: Secondary | ICD-10-CM | POA: Diagnosis not present

## 2014-07-07 DIAGNOSIS — F329 Major depressive disorder, single episode, unspecified: Secondary | ICD-10-CM | POA: Diagnosis not present

## 2014-07-07 DIAGNOSIS — I890 Lymphedema, not elsewhere classified: Secondary | ICD-10-CM | POA: Diagnosis not present

## 2014-07-07 DIAGNOSIS — I10 Essential (primary) hypertension: Secondary | ICD-10-CM | POA: Diagnosis not present

## 2014-07-07 DIAGNOSIS — I4891 Unspecified atrial fibrillation: Secondary | ICD-10-CM | POA: Diagnosis not present

## 2014-07-07 DIAGNOSIS — L97811 Non-pressure chronic ulcer of other part of right lower leg limited to breakdown of skin: Secondary | ICD-10-CM | POA: Diagnosis not present

## 2014-07-07 DIAGNOSIS — I872 Venous insufficiency (chronic) (peripheral): Secondary | ICD-10-CM | POA: Diagnosis not present

## 2014-07-08 ENCOUNTER — Other Ambulatory Visit: Payer: Self-pay | Admitting: Obstetrics and Gynecology

## 2014-07-08 DIAGNOSIS — N888 Other specified noninflammatory disorders of cervix uteri: Secondary | ICD-10-CM | POA: Diagnosis not present

## 2014-07-08 DIAGNOSIS — N950 Postmenopausal bleeding: Secondary | ICD-10-CM | POA: Diagnosis not present

## 2014-07-08 DIAGNOSIS — R938 Abnormal findings on diagnostic imaging of other specified body structures: Secondary | ICD-10-CM | POA: Diagnosis not present

## 2014-07-13 DIAGNOSIS — M1990 Unspecified osteoarthritis, unspecified site: Secondary | ICD-10-CM | POA: Diagnosis not present

## 2014-07-13 DIAGNOSIS — I890 Lymphedema, not elsewhere classified: Secondary | ICD-10-CM | POA: Diagnosis not present

## 2014-07-13 DIAGNOSIS — R5381 Other malaise: Secondary | ICD-10-CM | POA: Diagnosis not present

## 2014-07-14 DIAGNOSIS — I872 Venous insufficiency (chronic) (peripheral): Secondary | ICD-10-CM | POA: Diagnosis not present

## 2014-07-14 DIAGNOSIS — I10 Essential (primary) hypertension: Secondary | ICD-10-CM | POA: Diagnosis not present

## 2014-07-14 DIAGNOSIS — L97811 Non-pressure chronic ulcer of other part of right lower leg limited to breakdown of skin: Secondary | ICD-10-CM | POA: Diagnosis not present

## 2014-07-14 DIAGNOSIS — I4891 Unspecified atrial fibrillation: Secondary | ICD-10-CM | POA: Diagnosis not present

## 2014-07-14 DIAGNOSIS — F329 Major depressive disorder, single episode, unspecified: Secondary | ICD-10-CM | POA: Diagnosis not present

## 2014-07-14 DIAGNOSIS — I890 Lymphedema, not elsewhere classified: Secondary | ICD-10-CM | POA: Diagnosis not present

## 2014-07-21 DIAGNOSIS — B009 Herpesviral infection, unspecified: Secondary | ICD-10-CM | POA: Diagnosis not present

## 2014-08-03 DIAGNOSIS — F339 Major depressive disorder, recurrent, unspecified: Secondary | ICD-10-CM | POA: Diagnosis not present

## 2014-08-04 DIAGNOSIS — N390 Urinary tract infection, site not specified: Secondary | ICD-10-CM | POA: Diagnosis not present

## 2014-08-04 DIAGNOSIS — R8290 Unspecified abnormal findings in urine: Secondary | ICD-10-CM | POA: Diagnosis not present

## 2014-08-04 DIAGNOSIS — R102 Pelvic and perineal pain: Secondary | ICD-10-CM | POA: Diagnosis not present

## 2014-08-11 DIAGNOSIS — M5416 Radiculopathy, lumbar region: Secondary | ICD-10-CM | POA: Diagnosis not present

## 2014-08-11 DIAGNOSIS — M47812 Spondylosis without myelopathy or radiculopathy, cervical region: Secondary | ICD-10-CM | POA: Diagnosis not present

## 2014-08-11 DIAGNOSIS — M461 Sacroiliitis, not elsewhere classified: Secondary | ICD-10-CM | POA: Diagnosis not present

## 2014-08-11 DIAGNOSIS — G894 Chronic pain syndrome: Secondary | ICD-10-CM | POA: Diagnosis not present

## 2014-08-30 DIAGNOSIS — R32 Unspecified urinary incontinence: Secondary | ICD-10-CM | POA: Diagnosis not present

## 2014-08-30 DIAGNOSIS — F329 Major depressive disorder, single episode, unspecified: Secondary | ICD-10-CM | POA: Diagnosis not present

## 2014-08-30 DIAGNOSIS — M545 Low back pain: Secondary | ICD-10-CM | POA: Diagnosis not present

## 2014-08-30 DIAGNOSIS — Z872 Personal history of diseases of the skin and subcutaneous tissue: Secondary | ICD-10-CM | POA: Diagnosis not present

## 2014-08-30 DIAGNOSIS — M797 Fibromyalgia: Secondary | ICD-10-CM | POA: Diagnosis not present

## 2014-08-30 DIAGNOSIS — Z9181 History of falling: Secondary | ICD-10-CM | POA: Diagnosis not present

## 2014-08-30 DIAGNOSIS — I480 Paroxysmal atrial fibrillation: Secondary | ICD-10-CM | POA: Diagnosis not present

## 2014-08-30 DIAGNOSIS — M419 Scoliosis, unspecified: Secondary | ICD-10-CM | POA: Diagnosis not present

## 2014-08-30 DIAGNOSIS — Z87440 Personal history of urinary (tract) infections: Secondary | ICD-10-CM | POA: Diagnosis not present

## 2014-08-30 DIAGNOSIS — I10 Essential (primary) hypertension: Secondary | ICD-10-CM | POA: Diagnosis not present

## 2014-08-30 DIAGNOSIS — G8929 Other chronic pain: Secondary | ICD-10-CM | POA: Diagnosis not present

## 2014-08-30 DIAGNOSIS — R600 Localized edema: Secondary | ICD-10-CM | POA: Diagnosis not present

## 2014-08-30 DIAGNOSIS — M1990 Unspecified osteoarthritis, unspecified site: Secondary | ICD-10-CM | POA: Diagnosis not present

## 2014-08-31 DIAGNOSIS — M797 Fibromyalgia: Secondary | ICD-10-CM | POA: Diagnosis not present

## 2014-08-31 DIAGNOSIS — F339 Major depressive disorder, recurrent, unspecified: Secondary | ICD-10-CM | POA: Diagnosis not present

## 2014-08-31 DIAGNOSIS — G8929 Other chronic pain: Secondary | ICD-10-CM | POA: Diagnosis not present

## 2014-08-31 DIAGNOSIS — I10 Essential (primary) hypertension: Secondary | ICD-10-CM | POA: Diagnosis not present

## 2014-08-31 DIAGNOSIS — R600 Localized edema: Secondary | ICD-10-CM | POA: Diagnosis not present

## 2014-08-31 DIAGNOSIS — I480 Paroxysmal atrial fibrillation: Secondary | ICD-10-CM | POA: Diagnosis not present

## 2014-08-31 DIAGNOSIS — M545 Low back pain: Secondary | ICD-10-CM | POA: Diagnosis not present

## 2014-09-03 DIAGNOSIS — M545 Low back pain: Secondary | ICD-10-CM | POA: Diagnosis not present

## 2014-09-03 DIAGNOSIS — M797 Fibromyalgia: Secondary | ICD-10-CM | POA: Diagnosis not present

## 2014-09-03 DIAGNOSIS — I480 Paroxysmal atrial fibrillation: Secondary | ICD-10-CM | POA: Diagnosis not present

## 2014-09-03 DIAGNOSIS — R600 Localized edema: Secondary | ICD-10-CM | POA: Diagnosis not present

## 2014-09-03 DIAGNOSIS — G8929 Other chronic pain: Secondary | ICD-10-CM | POA: Diagnosis not present

## 2014-09-03 DIAGNOSIS — I10 Essential (primary) hypertension: Secondary | ICD-10-CM | POA: Diagnosis not present

## 2014-09-04 DIAGNOSIS — I10 Essential (primary) hypertension: Secondary | ICD-10-CM | POA: Diagnosis not present

## 2014-09-04 DIAGNOSIS — M545 Low back pain: Secondary | ICD-10-CM | POA: Diagnosis not present

## 2014-09-04 DIAGNOSIS — R600 Localized edema: Secondary | ICD-10-CM | POA: Diagnosis not present

## 2014-09-04 DIAGNOSIS — M797 Fibromyalgia: Secondary | ICD-10-CM | POA: Diagnosis not present

## 2014-09-04 DIAGNOSIS — I480 Paroxysmal atrial fibrillation: Secondary | ICD-10-CM | POA: Diagnosis not present

## 2014-09-04 DIAGNOSIS — G8929 Other chronic pain: Secondary | ICD-10-CM | POA: Diagnosis not present

## 2014-09-07 DIAGNOSIS — I10 Essential (primary) hypertension: Secondary | ICD-10-CM | POA: Diagnosis not present

## 2014-09-07 DIAGNOSIS — G8929 Other chronic pain: Secondary | ICD-10-CM | POA: Diagnosis not present

## 2014-09-07 DIAGNOSIS — I480 Paroxysmal atrial fibrillation: Secondary | ICD-10-CM | POA: Diagnosis not present

## 2014-09-07 DIAGNOSIS — R600 Localized edema: Secondary | ICD-10-CM | POA: Diagnosis not present

## 2014-09-07 DIAGNOSIS — M797 Fibromyalgia: Secondary | ICD-10-CM | POA: Diagnosis not present

## 2014-09-07 DIAGNOSIS — M545 Low back pain: Secondary | ICD-10-CM | POA: Diagnosis not present

## 2014-09-09 DIAGNOSIS — I739 Peripheral vascular disease, unspecified: Secondary | ICD-10-CM | POA: Diagnosis not present

## 2014-09-09 DIAGNOSIS — G8929 Other chronic pain: Secondary | ICD-10-CM | POA: Diagnosis not present

## 2014-09-09 DIAGNOSIS — I480 Paroxysmal atrial fibrillation: Secondary | ICD-10-CM | POA: Diagnosis not present

## 2014-09-09 DIAGNOSIS — M797 Fibromyalgia: Secondary | ICD-10-CM | POA: Diagnosis not present

## 2014-09-09 DIAGNOSIS — L84 Corns and callosities: Secondary | ICD-10-CM | POA: Diagnosis not present

## 2014-09-09 DIAGNOSIS — R600 Localized edema: Secondary | ICD-10-CM | POA: Diagnosis not present

## 2014-09-09 DIAGNOSIS — M545 Low back pain: Secondary | ICD-10-CM | POA: Diagnosis not present

## 2014-09-09 DIAGNOSIS — I10 Essential (primary) hypertension: Secondary | ICD-10-CM | POA: Diagnosis not present

## 2014-09-09 DIAGNOSIS — L603 Nail dystrophy: Secondary | ICD-10-CM | POA: Diagnosis not present

## 2014-09-13 DIAGNOSIS — I480 Paroxysmal atrial fibrillation: Secondary | ICD-10-CM | POA: Diagnosis not present

## 2014-09-13 DIAGNOSIS — M545 Low back pain: Secondary | ICD-10-CM | POA: Diagnosis not present

## 2014-09-13 DIAGNOSIS — G8929 Other chronic pain: Secondary | ICD-10-CM | POA: Diagnosis not present

## 2014-09-13 DIAGNOSIS — M797 Fibromyalgia: Secondary | ICD-10-CM | POA: Diagnosis not present

## 2014-09-13 DIAGNOSIS — I10 Essential (primary) hypertension: Secondary | ICD-10-CM | POA: Diagnosis not present

## 2014-09-13 DIAGNOSIS — R600 Localized edema: Secondary | ICD-10-CM | POA: Diagnosis not present

## 2014-09-14 DIAGNOSIS — M797 Fibromyalgia: Secondary | ICD-10-CM | POA: Diagnosis not present

## 2014-09-14 DIAGNOSIS — R600 Localized edema: Secondary | ICD-10-CM | POA: Diagnosis not present

## 2014-09-14 DIAGNOSIS — I480 Paroxysmal atrial fibrillation: Secondary | ICD-10-CM | POA: Diagnosis not present

## 2014-09-14 DIAGNOSIS — I10 Essential (primary) hypertension: Secondary | ICD-10-CM | POA: Diagnosis not present

## 2014-09-14 DIAGNOSIS — G8929 Other chronic pain: Secondary | ICD-10-CM | POA: Diagnosis not present

## 2014-09-14 DIAGNOSIS — M545 Low back pain: Secondary | ICD-10-CM | POA: Diagnosis not present

## 2014-09-16 DIAGNOSIS — I480 Paroxysmal atrial fibrillation: Secondary | ICD-10-CM | POA: Diagnosis not present

## 2014-09-16 DIAGNOSIS — I10 Essential (primary) hypertension: Secondary | ICD-10-CM | POA: Diagnosis not present

## 2014-09-16 DIAGNOSIS — M797 Fibromyalgia: Secondary | ICD-10-CM | POA: Diagnosis not present

## 2014-09-16 DIAGNOSIS — R600 Localized edema: Secondary | ICD-10-CM | POA: Diagnosis not present

## 2014-09-16 DIAGNOSIS — G8929 Other chronic pain: Secondary | ICD-10-CM | POA: Diagnosis not present

## 2014-09-16 DIAGNOSIS — M545 Low back pain: Secondary | ICD-10-CM | POA: Diagnosis not present

## 2014-09-21 DIAGNOSIS — M797 Fibromyalgia: Secondary | ICD-10-CM | POA: Diagnosis not present

## 2014-09-21 DIAGNOSIS — I10 Essential (primary) hypertension: Secondary | ICD-10-CM | POA: Diagnosis not present

## 2014-09-21 DIAGNOSIS — I480 Paroxysmal atrial fibrillation: Secondary | ICD-10-CM | POA: Diagnosis not present

## 2014-09-21 DIAGNOSIS — M545 Low back pain: Secondary | ICD-10-CM | POA: Diagnosis not present

## 2014-09-21 DIAGNOSIS — R600 Localized edema: Secondary | ICD-10-CM | POA: Diagnosis not present

## 2014-09-21 DIAGNOSIS — G8929 Other chronic pain: Secondary | ICD-10-CM | POA: Diagnosis not present

## 2014-09-24 DIAGNOSIS — I10 Essential (primary) hypertension: Secondary | ICD-10-CM | POA: Diagnosis not present

## 2014-09-24 DIAGNOSIS — G8929 Other chronic pain: Secondary | ICD-10-CM | POA: Diagnosis not present

## 2014-09-24 DIAGNOSIS — I480 Paroxysmal atrial fibrillation: Secondary | ICD-10-CM | POA: Diagnosis not present

## 2014-09-24 DIAGNOSIS — R600 Localized edema: Secondary | ICD-10-CM | POA: Diagnosis not present

## 2014-09-24 DIAGNOSIS — M545 Low back pain: Secondary | ICD-10-CM | POA: Diagnosis not present

## 2014-09-24 DIAGNOSIS — M797 Fibromyalgia: Secondary | ICD-10-CM | POA: Diagnosis not present

## 2014-09-27 DIAGNOSIS — I480 Paroxysmal atrial fibrillation: Secondary | ICD-10-CM | POA: Diagnosis not present

## 2014-09-27 DIAGNOSIS — G8929 Other chronic pain: Secondary | ICD-10-CM | POA: Diagnosis not present

## 2014-09-27 DIAGNOSIS — I10 Essential (primary) hypertension: Secondary | ICD-10-CM | POA: Diagnosis not present

## 2014-09-27 DIAGNOSIS — R600 Localized edema: Secondary | ICD-10-CM | POA: Diagnosis not present

## 2014-09-27 DIAGNOSIS — M545 Low back pain: Secondary | ICD-10-CM | POA: Diagnosis not present

## 2014-09-27 DIAGNOSIS — M797 Fibromyalgia: Secondary | ICD-10-CM | POA: Diagnosis not present

## 2014-09-28 DIAGNOSIS — F339 Major depressive disorder, recurrent, unspecified: Secondary | ICD-10-CM | POA: Diagnosis not present

## 2014-09-29 DIAGNOSIS — M797 Fibromyalgia: Secondary | ICD-10-CM | POA: Diagnosis not present

## 2014-09-29 DIAGNOSIS — M545 Low back pain: Secondary | ICD-10-CM | POA: Diagnosis not present

## 2014-09-29 DIAGNOSIS — I480 Paroxysmal atrial fibrillation: Secondary | ICD-10-CM | POA: Diagnosis not present

## 2014-09-29 DIAGNOSIS — I10 Essential (primary) hypertension: Secondary | ICD-10-CM | POA: Diagnosis not present

## 2014-09-29 DIAGNOSIS — R600 Localized edema: Secondary | ICD-10-CM | POA: Diagnosis not present

## 2014-09-29 DIAGNOSIS — G8929 Other chronic pain: Secondary | ICD-10-CM | POA: Diagnosis not present

## 2014-09-30 DIAGNOSIS — R600 Localized edema: Secondary | ICD-10-CM | POA: Diagnosis not present

## 2014-09-30 DIAGNOSIS — R300 Dysuria: Secondary | ICD-10-CM | POA: Diagnosis not present

## 2014-09-30 DIAGNOSIS — I480 Paroxysmal atrial fibrillation: Secondary | ICD-10-CM | POA: Diagnosis not present

## 2014-09-30 DIAGNOSIS — M797 Fibromyalgia: Secondary | ICD-10-CM | POA: Diagnosis not present

## 2014-09-30 DIAGNOSIS — G8929 Other chronic pain: Secondary | ICD-10-CM | POA: Diagnosis not present

## 2014-09-30 DIAGNOSIS — M545 Low back pain: Secondary | ICD-10-CM | POA: Diagnosis not present

## 2014-09-30 DIAGNOSIS — I10 Essential (primary) hypertension: Secondary | ICD-10-CM | POA: Diagnosis not present

## 2014-10-06 DIAGNOSIS — M461 Sacroiliitis, not elsewhere classified: Secondary | ICD-10-CM | POA: Diagnosis not present

## 2014-10-06 DIAGNOSIS — M47812 Spondylosis without myelopathy or radiculopathy, cervical region: Secondary | ICD-10-CM | POA: Diagnosis not present

## 2014-10-06 DIAGNOSIS — F331 Major depressive disorder, recurrent, moderate: Secondary | ICD-10-CM | POA: Diagnosis not present

## 2014-10-06 DIAGNOSIS — M5416 Radiculopathy, lumbar region: Secondary | ICD-10-CM | POA: Diagnosis not present

## 2014-10-06 DIAGNOSIS — G894 Chronic pain syndrome: Secondary | ICD-10-CM | POA: Diagnosis not present

## 2014-10-12 DIAGNOSIS — F331 Major depressive disorder, recurrent, moderate: Secondary | ICD-10-CM | POA: Diagnosis not present

## 2014-10-19 DIAGNOSIS — F331 Major depressive disorder, recurrent, moderate: Secondary | ICD-10-CM | POA: Diagnosis not present

## 2014-10-20 DIAGNOSIS — R600 Localized edema: Secondary | ICD-10-CM | POA: Diagnosis not present

## 2014-10-20 DIAGNOSIS — I890 Lymphedema, not elsewhere classified: Secondary | ICD-10-CM | POA: Diagnosis not present

## 2014-10-20 DIAGNOSIS — L02419 Cutaneous abscess of limb, unspecified: Secondary | ICD-10-CM | POA: Diagnosis not present

## 2014-10-26 DIAGNOSIS — F331 Major depressive disorder, recurrent, moderate: Secondary | ICD-10-CM | POA: Diagnosis not present

## 2014-10-26 DIAGNOSIS — F339 Major depressive disorder, recurrent, unspecified: Secondary | ICD-10-CM | POA: Diagnosis not present

## 2014-10-28 DIAGNOSIS — E669 Obesity, unspecified: Secondary | ICD-10-CM | POA: Diagnosis not present

## 2014-10-28 DIAGNOSIS — I119 Hypertensive heart disease without heart failure: Secondary | ICD-10-CM | POA: Diagnosis not present

## 2014-10-28 DIAGNOSIS — E039 Hypothyroidism, unspecified: Secondary | ICD-10-CM | POA: Diagnosis not present

## 2014-10-28 DIAGNOSIS — Z6834 Body mass index (BMI) 34.0-34.9, adult: Secondary | ICD-10-CM | POA: Diagnosis not present

## 2014-10-28 DIAGNOSIS — I890 Lymphedema, not elsewhere classified: Secondary | ICD-10-CM | POA: Diagnosis not present

## 2014-10-28 DIAGNOSIS — Z0001 Encounter for general adult medical examination with abnormal findings: Secondary | ICD-10-CM | POA: Diagnosis not present

## 2014-10-28 DIAGNOSIS — E782 Mixed hyperlipidemia: Secondary | ICD-10-CM | POA: Diagnosis not present

## 2014-10-28 DIAGNOSIS — F329 Major depressive disorder, single episode, unspecified: Secondary | ICD-10-CM | POA: Diagnosis not present

## 2014-11-18 DIAGNOSIS — F331 Major depressive disorder, recurrent, moderate: Secondary | ICD-10-CM | POA: Diagnosis not present

## 2014-11-23 DIAGNOSIS — F339 Major depressive disorder, recurrent, unspecified: Secondary | ICD-10-CM | POA: Diagnosis not present

## 2014-11-25 DIAGNOSIS — I739 Peripheral vascular disease, unspecified: Secondary | ICD-10-CM | POA: Diagnosis not present

## 2014-11-25 DIAGNOSIS — L84 Corns and callosities: Secondary | ICD-10-CM | POA: Diagnosis not present

## 2014-11-25 DIAGNOSIS — L603 Nail dystrophy: Secondary | ICD-10-CM | POA: Diagnosis not present

## 2014-12-01 DIAGNOSIS — M5416 Radiculopathy, lumbar region: Secondary | ICD-10-CM | POA: Diagnosis not present

## 2014-12-01 DIAGNOSIS — M461 Sacroiliitis, not elsewhere classified: Secondary | ICD-10-CM | POA: Diagnosis not present

## 2014-12-01 DIAGNOSIS — M47812 Spondylosis without myelopathy or radiculopathy, cervical region: Secondary | ICD-10-CM | POA: Diagnosis not present

## 2014-12-01 DIAGNOSIS — G894 Chronic pain syndrome: Secondary | ICD-10-CM | POA: Diagnosis not present

## 2014-12-21 DIAGNOSIS — F339 Major depressive disorder, recurrent, unspecified: Secondary | ICD-10-CM | POA: Diagnosis not present

## 2015-01-18 DIAGNOSIS — F339 Major depressive disorder, recurrent, unspecified: Secondary | ICD-10-CM | POA: Diagnosis not present

## 2015-01-28 ENCOUNTER — Other Ambulatory Visit: Payer: Self-pay

## 2015-01-28 ENCOUNTER — Other Ambulatory Visit: Payer: Self-pay | Admitting: Obstetrics and Gynecology

## 2015-01-28 DIAGNOSIS — Z9889 Other specified postprocedural states: Secondary | ICD-10-CM

## 2015-01-28 DIAGNOSIS — Z853 Personal history of malignant neoplasm of breast: Secondary | ICD-10-CM

## 2015-01-31 DIAGNOSIS — M47812 Spondylosis without myelopathy or radiculopathy, cervical region: Secondary | ICD-10-CM | POA: Diagnosis not present

## 2015-01-31 DIAGNOSIS — M5416 Radiculopathy, lumbar region: Secondary | ICD-10-CM | POA: Diagnosis not present

## 2015-01-31 DIAGNOSIS — M461 Sacroiliitis, not elsewhere classified: Secondary | ICD-10-CM | POA: Diagnosis not present

## 2015-01-31 DIAGNOSIS — G894 Chronic pain syndrome: Secondary | ICD-10-CM | POA: Diagnosis not present

## 2015-02-01 DIAGNOSIS — M545 Low back pain: Secondary | ICD-10-CM | POA: Diagnosis not present

## 2015-02-09 DIAGNOSIS — Z01419 Encounter for gynecological examination (general) (routine) without abnormal findings: Secondary | ICD-10-CM | POA: Diagnosis not present

## 2015-02-09 DIAGNOSIS — K5901 Slow transit constipation: Secondary | ICD-10-CM | POA: Diagnosis not present

## 2015-02-10 DIAGNOSIS — L84 Corns and callosities: Secondary | ICD-10-CM | POA: Diagnosis not present

## 2015-02-10 DIAGNOSIS — I739 Peripheral vascular disease, unspecified: Secondary | ICD-10-CM | POA: Diagnosis not present

## 2015-02-10 DIAGNOSIS — L603 Nail dystrophy: Secondary | ICD-10-CM | POA: Diagnosis not present

## 2015-02-17 ENCOUNTER — Ambulatory Visit
Admission: RE | Admit: 2015-02-17 | Discharge: 2015-02-17 | Disposition: A | Discharge status: Home / Self Care | Payer: Medicare Other | Source: Ambulatory Visit | Attending: Obstetrics and Gynecology | Admitting: Obstetrics and Gynecology

## 2015-02-17 DIAGNOSIS — Z9889 Other specified postprocedural states: Secondary | ICD-10-CM

## 2015-02-17 DIAGNOSIS — R928 Other abnormal and inconclusive findings on diagnostic imaging of breast: Secondary | ICD-10-CM | POA: Diagnosis not present

## 2015-02-17 DIAGNOSIS — Z853 Personal history of malignant neoplasm of breast: Secondary | ICD-10-CM

## 2015-02-22 DIAGNOSIS — F339 Major depressive disorder, recurrent, unspecified: Secondary | ICD-10-CM | POA: Diagnosis not present

## 2015-03-01 DIAGNOSIS — Z23 Encounter for immunization: Secondary | ICD-10-CM | POA: Diagnosis not present

## 2015-03-04 DIAGNOSIS — M461 Sacroiliitis, not elsewhere classified: Secondary | ICD-10-CM | POA: Diagnosis not present

## 2015-03-04 DIAGNOSIS — G894 Chronic pain syndrome: Secondary | ICD-10-CM | POA: Diagnosis not present

## 2015-03-04 DIAGNOSIS — M5416 Radiculopathy, lumbar region: Secondary | ICD-10-CM | POA: Diagnosis not present

## 2015-03-04 DIAGNOSIS — M47812 Spondylosis without myelopathy or radiculopathy, cervical region: Secondary | ICD-10-CM | POA: Diagnosis not present

## 2015-03-07 DIAGNOSIS — F339 Major depressive disorder, recurrent, unspecified: Secondary | ICD-10-CM | POA: Diagnosis not present

## 2015-03-16 DIAGNOSIS — F339 Major depressive disorder, recurrent, unspecified: Secondary | ICD-10-CM | POA: Diagnosis not present

## 2015-03-21 DIAGNOSIS — F339 Major depressive disorder, recurrent, unspecified: Secondary | ICD-10-CM | POA: Diagnosis not present

## 2015-03-28 DIAGNOSIS — M47812 Spondylosis without myelopathy or radiculopathy, cervical region: Secondary | ICD-10-CM | POA: Diagnosis not present

## 2015-03-28 DIAGNOSIS — G894 Chronic pain syndrome: Secondary | ICD-10-CM | POA: Diagnosis not present

## 2015-03-28 DIAGNOSIS — M461 Sacroiliitis, not elsewhere classified: Secondary | ICD-10-CM | POA: Diagnosis not present

## 2015-03-28 DIAGNOSIS — M5416 Radiculopathy, lumbar region: Secondary | ICD-10-CM | POA: Diagnosis not present

## 2015-04-04 DIAGNOSIS — F339 Major depressive disorder, recurrent, unspecified: Secondary | ICD-10-CM | POA: Diagnosis not present

## 2015-04-06 DIAGNOSIS — F339 Major depressive disorder, recurrent, unspecified: Secondary | ICD-10-CM | POA: Diagnosis not present

## 2015-04-18 DIAGNOSIS — F339 Major depressive disorder, recurrent, unspecified: Secondary | ICD-10-CM | POA: Diagnosis not present

## 2015-04-26 DIAGNOSIS — M47812 Spondylosis without myelopathy or radiculopathy, cervical region: Secondary | ICD-10-CM | POA: Diagnosis not present

## 2015-04-26 DIAGNOSIS — M5416 Radiculopathy, lumbar region: Secondary | ICD-10-CM | POA: Diagnosis not present

## 2015-04-26 DIAGNOSIS — M461 Sacroiliitis, not elsewhere classified: Secondary | ICD-10-CM | POA: Diagnosis not present

## 2015-04-26 DIAGNOSIS — G894 Chronic pain syndrome: Secondary | ICD-10-CM | POA: Diagnosis not present

## 2015-05-03 DIAGNOSIS — L0390 Cellulitis, unspecified: Secondary | ICD-10-CM | POA: Diagnosis not present

## 2015-05-03 DIAGNOSIS — F329 Major depressive disorder, single episode, unspecified: Secondary | ICD-10-CM | POA: Diagnosis not present

## 2015-05-03 DIAGNOSIS — E782 Mixed hyperlipidemia: Secondary | ICD-10-CM | POA: Diagnosis not present

## 2015-05-03 DIAGNOSIS — I119 Hypertensive heart disease without heart failure: Secondary | ICD-10-CM | POA: Diagnosis not present

## 2015-05-03 DIAGNOSIS — H6120 Impacted cerumen, unspecified ear: Secondary | ICD-10-CM | POA: Diagnosis not present

## 2015-05-03 DIAGNOSIS — R8299 Other abnormal findings in urine: Secondary | ICD-10-CM | POA: Diagnosis not present

## 2015-05-03 DIAGNOSIS — M797 Fibromyalgia: Secondary | ICD-10-CM | POA: Diagnosis not present

## 2015-05-03 DIAGNOSIS — E039 Hypothyroidism, unspecified: Secondary | ICD-10-CM | POA: Diagnosis not present

## 2015-05-03 DIAGNOSIS — I890 Lymphedema, not elsewhere classified: Secondary | ICD-10-CM | POA: Diagnosis not present

## 2015-05-05 DIAGNOSIS — L603 Nail dystrophy: Secondary | ICD-10-CM | POA: Diagnosis not present

## 2015-05-05 DIAGNOSIS — L84 Corns and callosities: Secondary | ICD-10-CM | POA: Diagnosis not present

## 2015-05-05 DIAGNOSIS — I739 Peripheral vascular disease, unspecified: Secondary | ICD-10-CM | POA: Diagnosis not present

## 2015-05-09 DIAGNOSIS — F339 Major depressive disorder, recurrent, unspecified: Secondary | ICD-10-CM | POA: Diagnosis not present

## 2015-05-25 DIAGNOSIS — G894 Chronic pain syndrome: Secondary | ICD-10-CM | POA: Diagnosis not present

## 2015-05-25 DIAGNOSIS — M47812 Spondylosis without myelopathy or radiculopathy, cervical region: Secondary | ICD-10-CM | POA: Diagnosis not present

## 2015-05-25 DIAGNOSIS — Z79891 Long term (current) use of opiate analgesic: Secondary | ICD-10-CM | POA: Diagnosis not present

## 2015-05-25 DIAGNOSIS — M461 Sacroiliitis, not elsewhere classified: Secondary | ICD-10-CM | POA: Diagnosis not present

## 2015-05-25 DIAGNOSIS — M5416 Radiculopathy, lumbar region: Secondary | ICD-10-CM | POA: Diagnosis not present

## 2015-05-30 DIAGNOSIS — R0989 Other specified symptoms and signs involving the circulatory and respiratory systems: Secondary | ICD-10-CM | POA: Diagnosis not present

## 2015-05-30 DIAGNOSIS — W19XXXA Unspecified fall, initial encounter: Secondary | ICD-10-CM | POA: Diagnosis not present

## 2015-05-30 DIAGNOSIS — L0390 Cellulitis, unspecified: Secondary | ICD-10-CM | POA: Diagnosis not present

## 2015-05-30 DIAGNOSIS — R12 Heartburn: Secondary | ICD-10-CM | POA: Diagnosis not present

## 2015-06-13 DIAGNOSIS — F339 Major depressive disorder, recurrent, unspecified: Secondary | ICD-10-CM | POA: Diagnosis not present

## 2015-06-16 IMAGING — CR DG CHEST 2V
2 series · 2 of 2 positions shown · non-contrast
Comparison: August 16, 2012

CLINICAL DATA: Weakness and shortness of breath

EXAM:
CHEST  2 VIEW

[x chest ap]
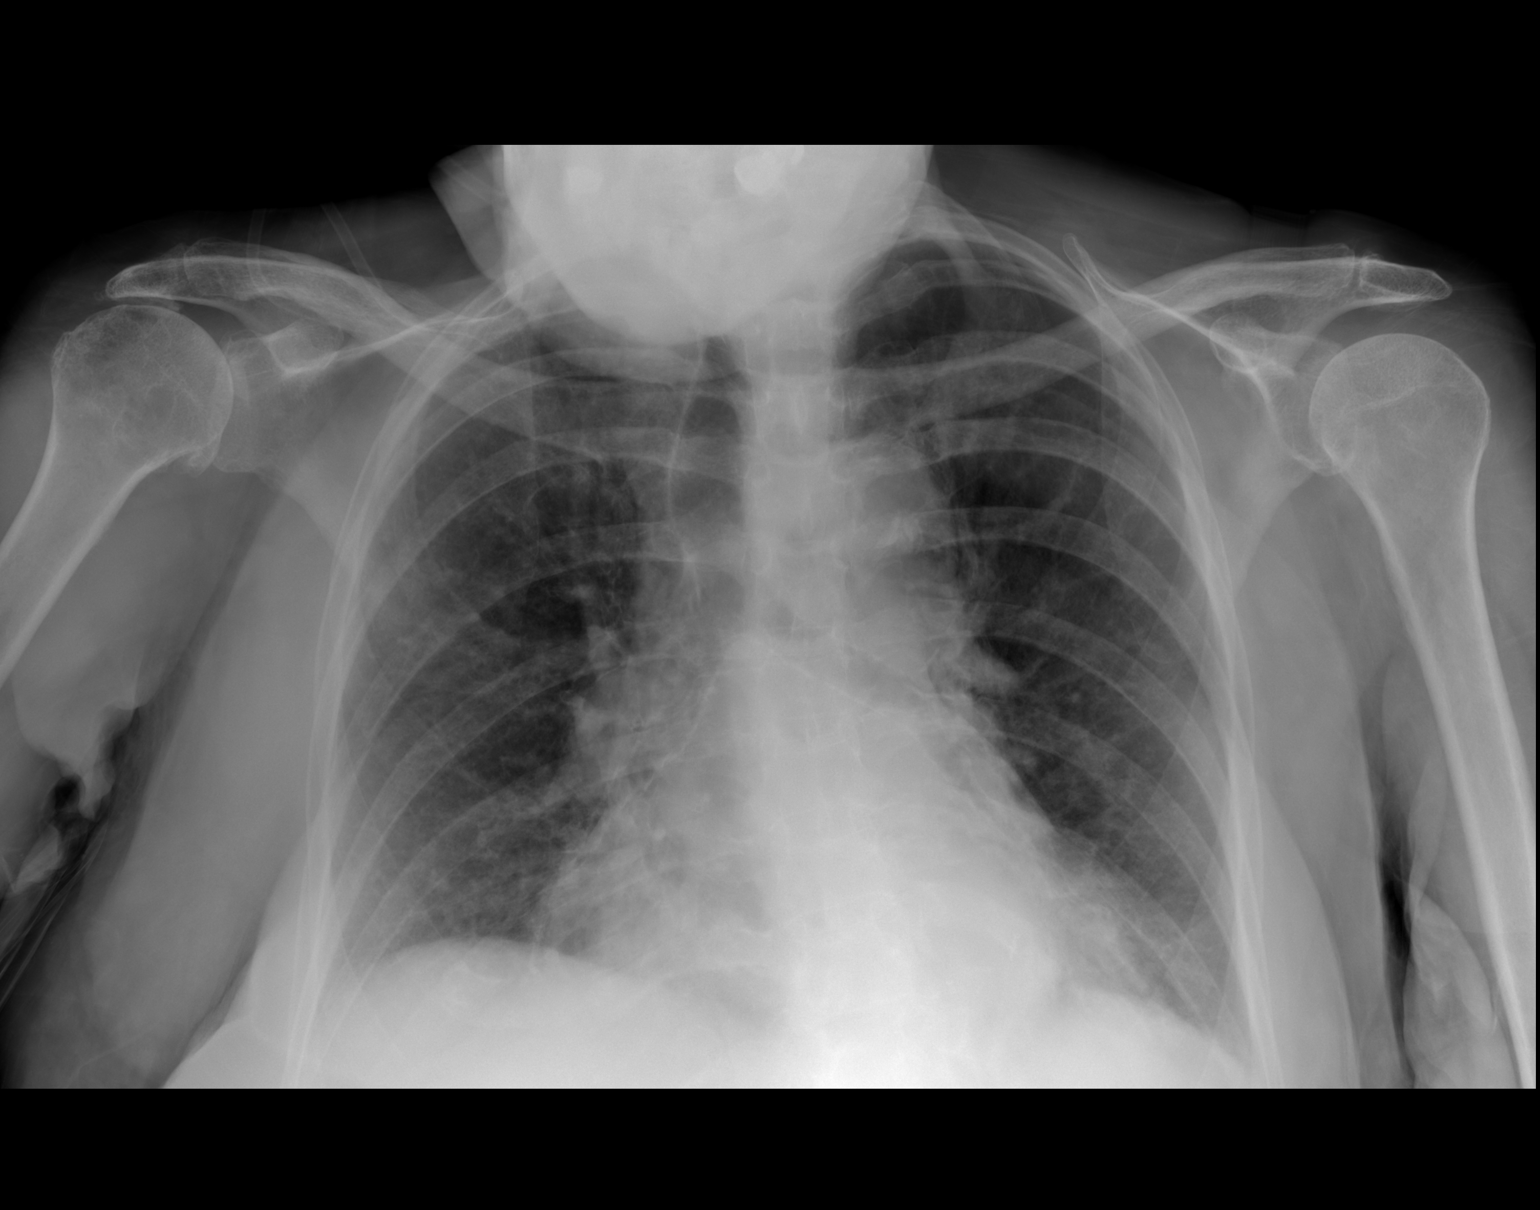

[w chest lat]
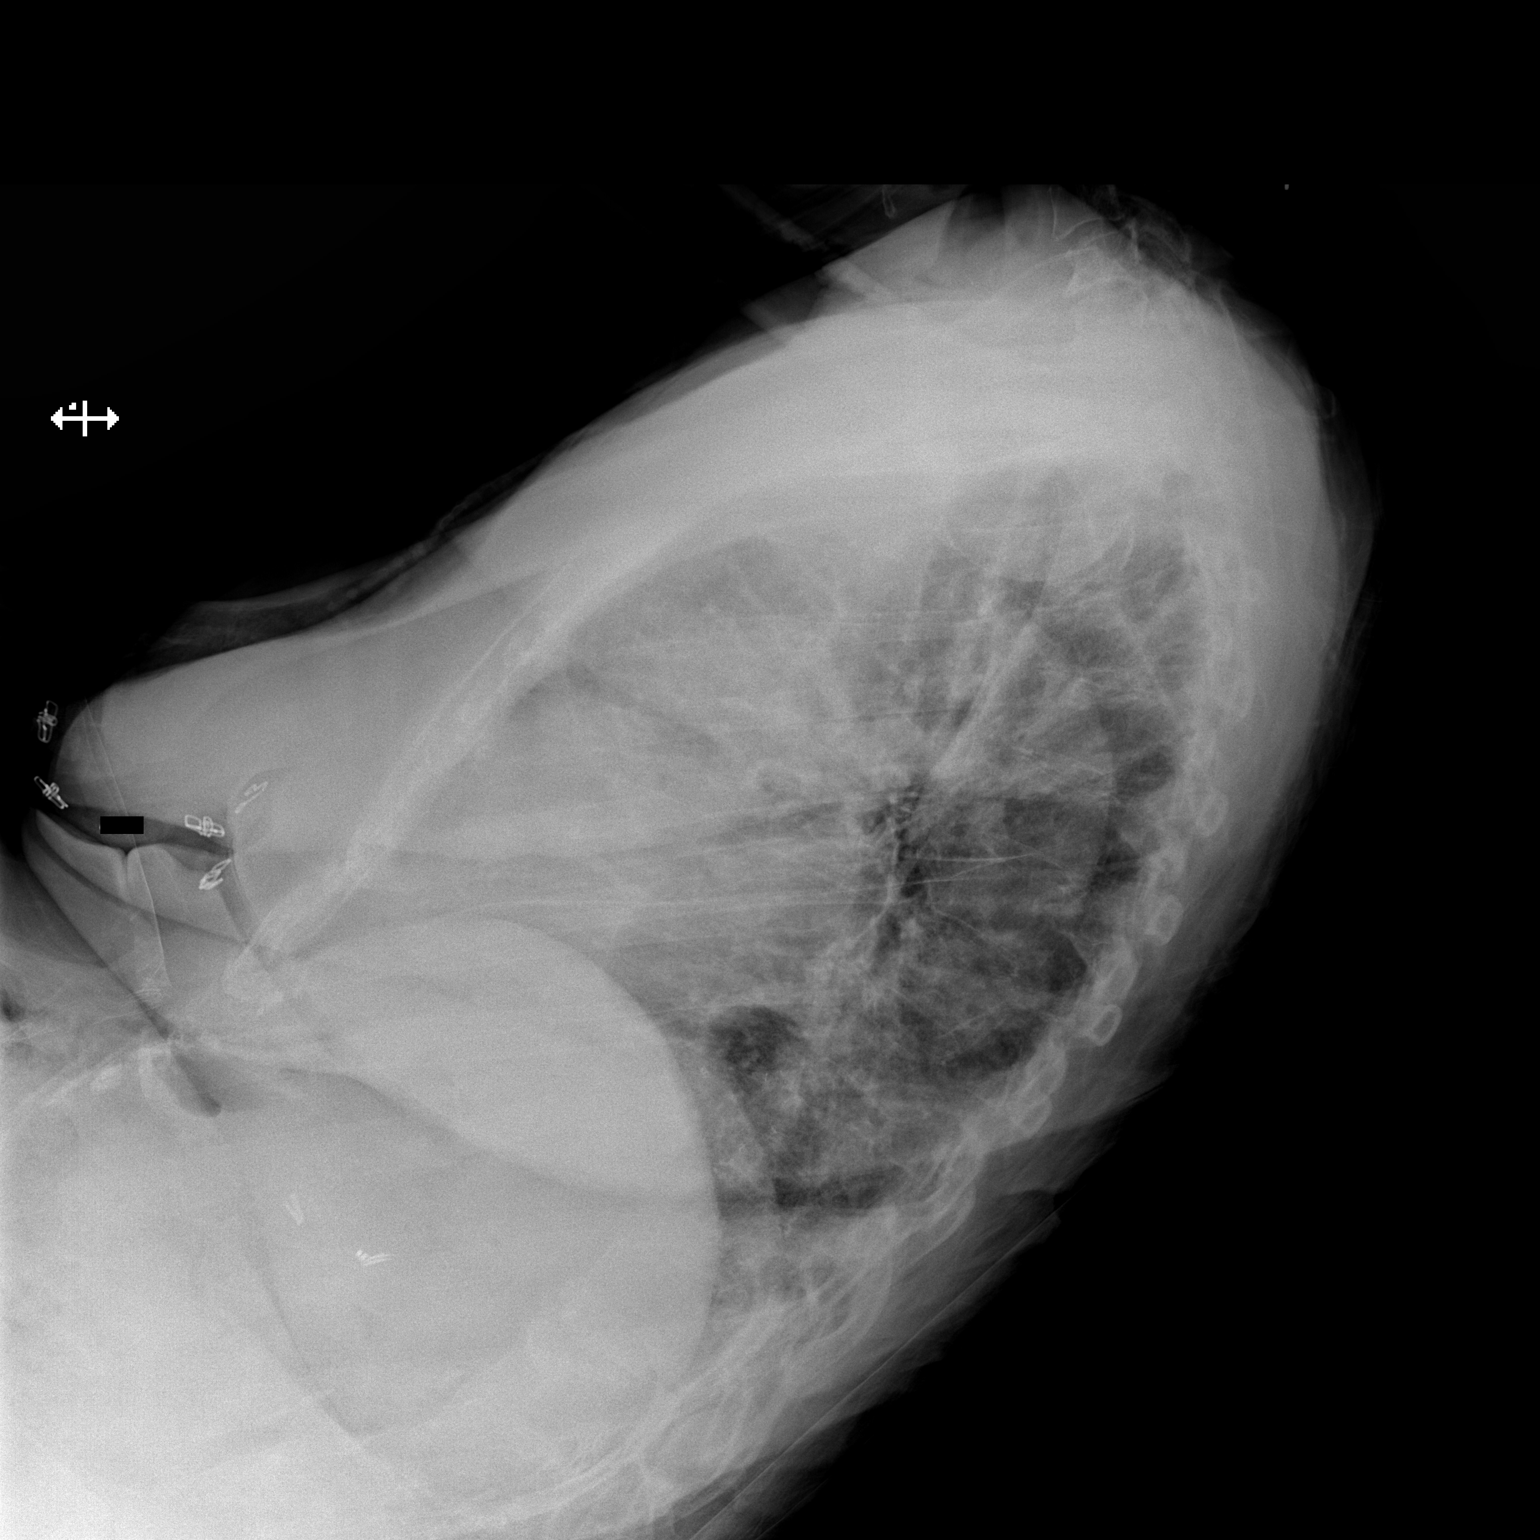

[2 of 2 positions shown; findings below may reference images not displayed]

FINDINGS: There is no edema or consolidation. The heart is mildly enlarged
with normal pulmonary vascularity. No adenopathy. There is a hiatal
hernia present.
IMPRESSION: Mild cardiac enlargement. Hiatal hernia present. No edema or
consolidation.

## 2015-06-16 IMAGING — CR DG ELBOW COMPLETE 3+V*L*
4 series · 4 of 4 positions shown · non-contrast
Comparison: None

CLINICAL DATA: Left elbow pain, fell 1 day ago

EXAM:
LEFT ELBOW - COMPLETE 3+ VIEW

[x elbow obl left]
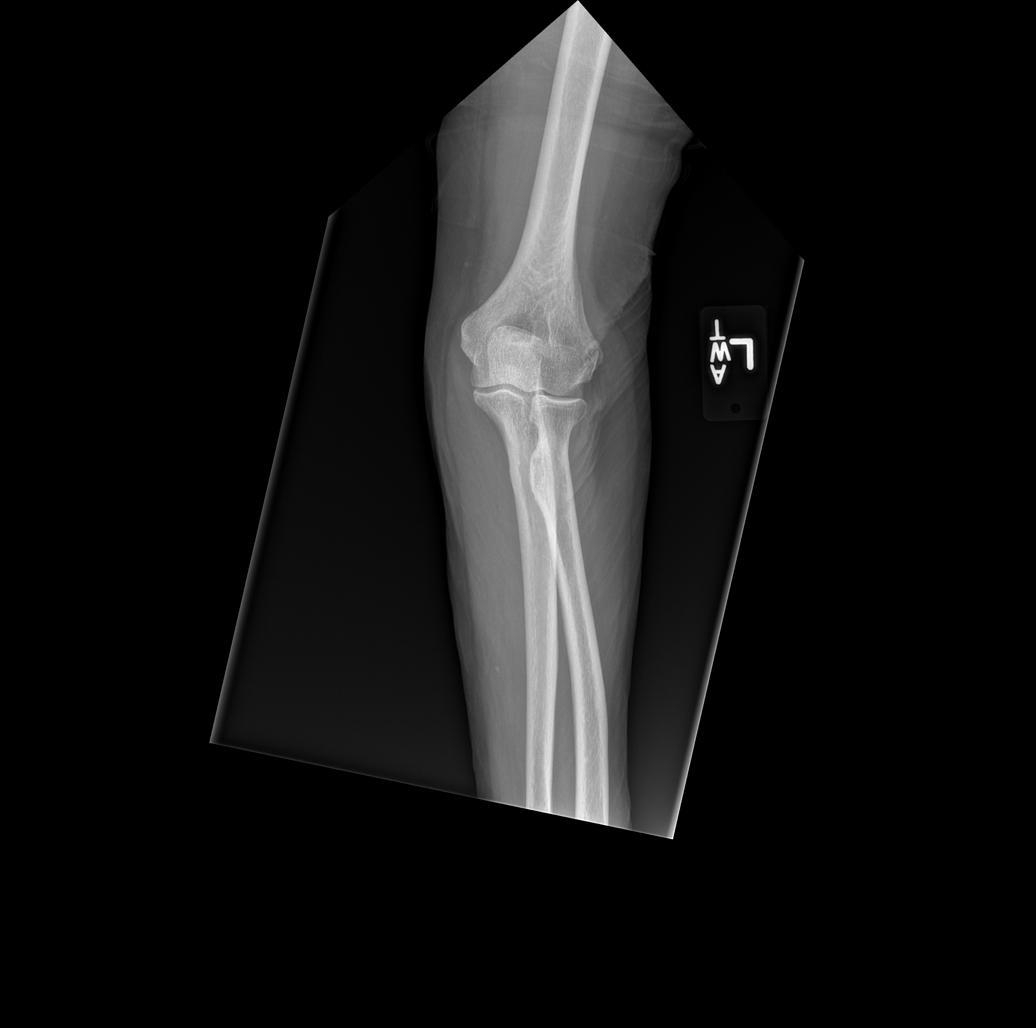

[x elbow lat left (1 of 3)]
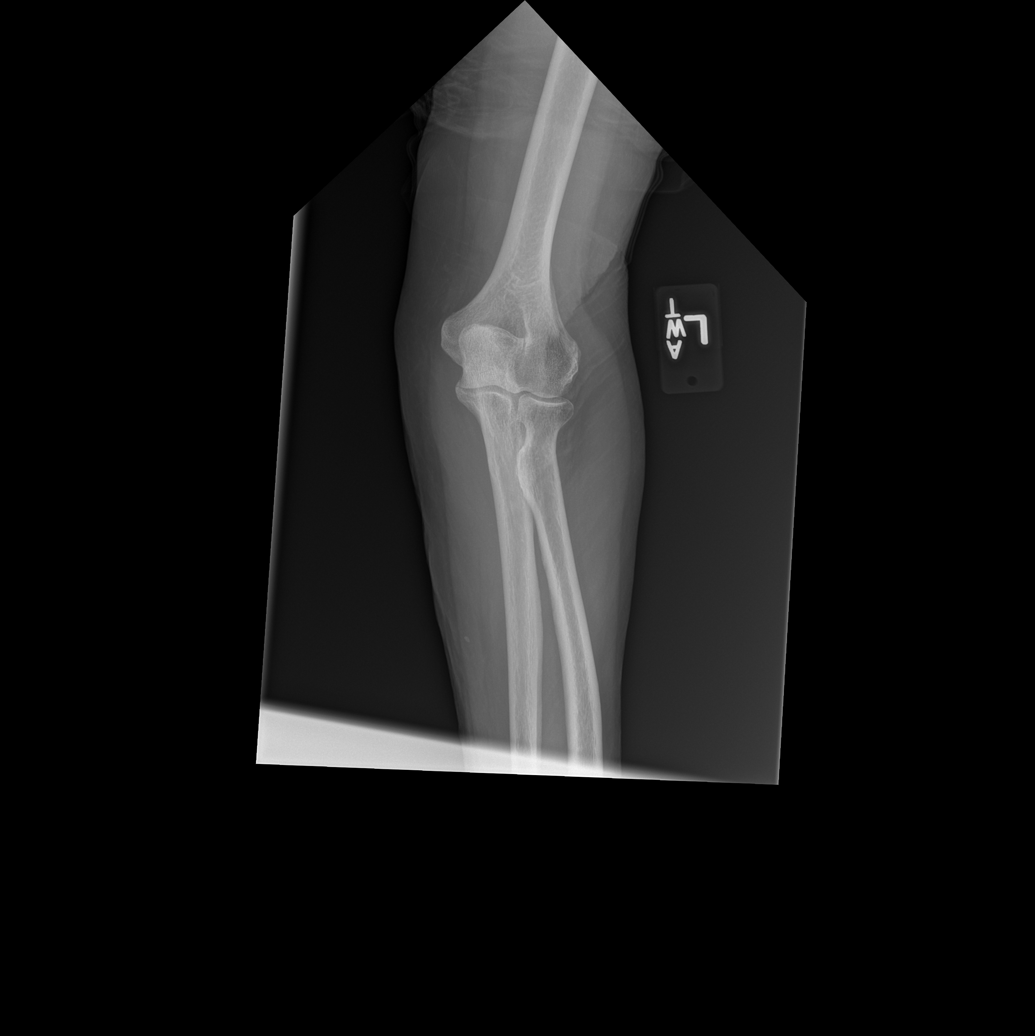

[x elbow lat left (2 of 3)]
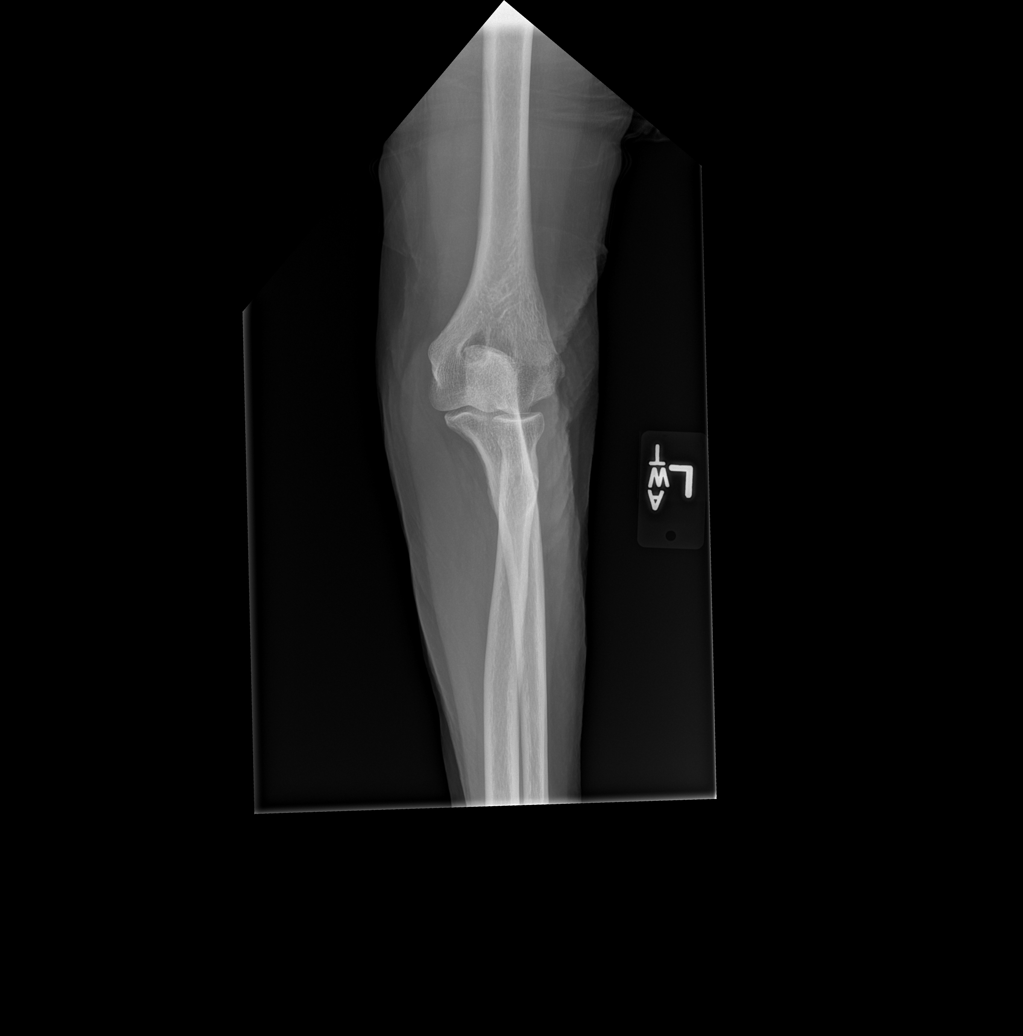

[x elbow lat left (3 of 3)]
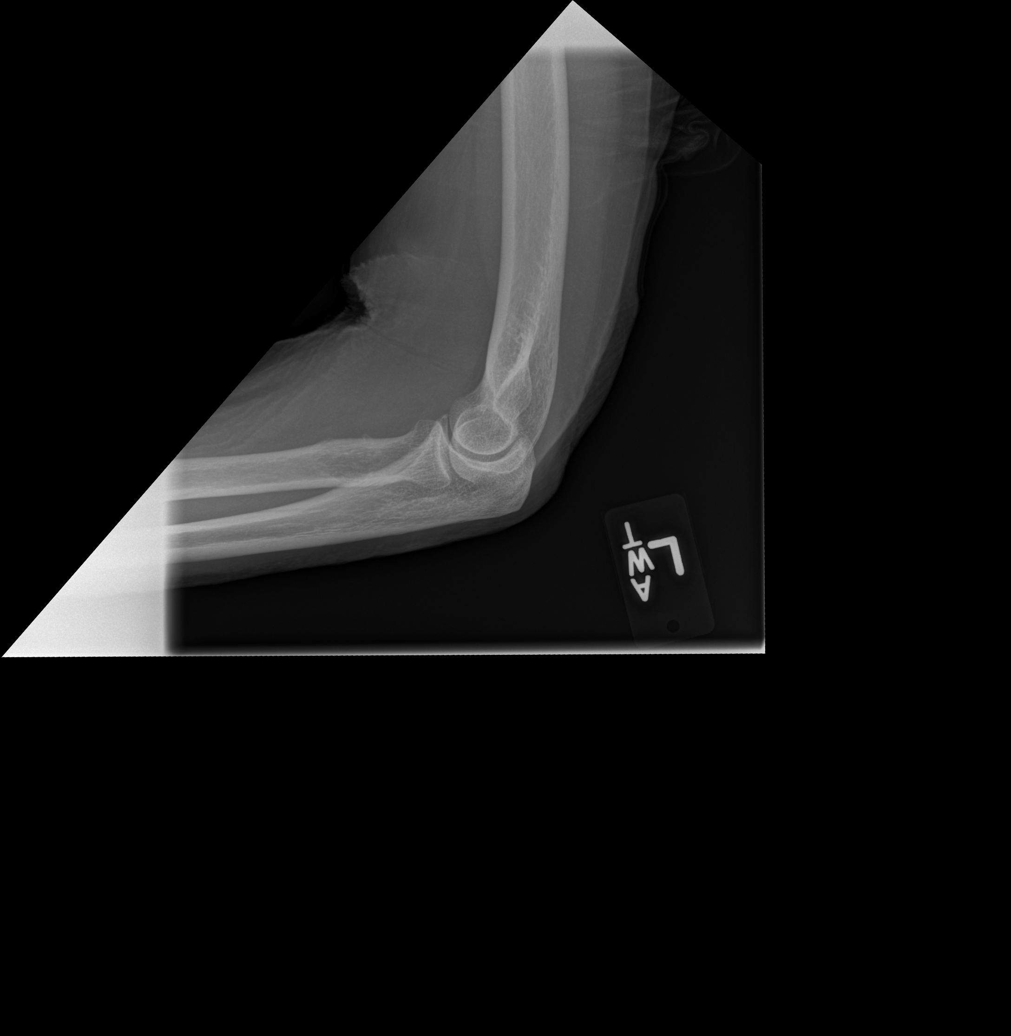

[4 of 4 positions shown; findings below may reference images not displayed]

FINDINGS: Mild osseous demineralization.

Joint spaces preserved.

No acute fracture, dislocation or bone destruction.

No elbow joint effusion.
IMPRESSION: Normal exam.

## 2015-06-20 DIAGNOSIS — F339 Major depressive disorder, recurrent, unspecified: Secondary | ICD-10-CM | POA: Diagnosis not present

## 2015-06-22 DIAGNOSIS — M25519 Pain in unspecified shoulder: Secondary | ICD-10-CM | POA: Diagnosis not present

## 2015-06-22 DIAGNOSIS — M461 Sacroiliitis, not elsewhere classified: Secondary | ICD-10-CM | POA: Diagnosis not present

## 2015-06-22 DIAGNOSIS — M5416 Radiculopathy, lumbar region: Secondary | ICD-10-CM | POA: Diagnosis not present

## 2015-06-22 DIAGNOSIS — G894 Chronic pain syndrome: Secondary | ICD-10-CM | POA: Diagnosis not present

## 2015-06-22 DIAGNOSIS — M47812 Spondylosis without myelopathy or radiculopathy, cervical region: Secondary | ICD-10-CM | POA: Diagnosis not present

## 2015-07-18 DIAGNOSIS — M47812 Spondylosis without myelopathy or radiculopathy, cervical region: Secondary | ICD-10-CM | POA: Diagnosis not present

## 2015-07-18 DIAGNOSIS — M461 Sacroiliitis, not elsewhere classified: Secondary | ICD-10-CM | POA: Diagnosis not present

## 2015-07-18 DIAGNOSIS — G894 Chronic pain syndrome: Secondary | ICD-10-CM | POA: Diagnosis not present

## 2015-07-18 DIAGNOSIS — M5416 Radiculopathy, lumbar region: Secondary | ICD-10-CM | POA: Diagnosis not present

## 2015-07-25 DIAGNOSIS — F339 Major depressive disorder, recurrent, unspecified: Secondary | ICD-10-CM | POA: Diagnosis not present

## 2015-07-28 DIAGNOSIS — H1013 Acute atopic conjunctivitis, bilateral: Secondary | ICD-10-CM | POA: Diagnosis not present

## 2015-08-01 DIAGNOSIS — F339 Major depressive disorder, recurrent, unspecified: Secondary | ICD-10-CM | POA: Diagnosis not present

## 2015-08-08 DIAGNOSIS — F339 Major depressive disorder, recurrent, unspecified: Secondary | ICD-10-CM | POA: Diagnosis not present

## 2015-08-16 DIAGNOSIS — M47812 Spondylosis without myelopathy or radiculopathy, cervical region: Secondary | ICD-10-CM | POA: Diagnosis not present

## 2015-08-16 DIAGNOSIS — G894 Chronic pain syndrome: Secondary | ICD-10-CM | POA: Diagnosis not present

## 2015-08-16 DIAGNOSIS — M461 Sacroiliitis, not elsewhere classified: Secondary | ICD-10-CM | POA: Diagnosis not present

## 2015-08-16 DIAGNOSIS — M5416 Radiculopathy, lumbar region: Secondary | ICD-10-CM | POA: Diagnosis not present

## 2015-08-29 DIAGNOSIS — F339 Major depressive disorder, recurrent, unspecified: Secondary | ICD-10-CM | POA: Diagnosis not present

## 2015-09-12 DIAGNOSIS — M47812 Spondylosis without myelopathy or radiculopathy, cervical region: Secondary | ICD-10-CM | POA: Diagnosis not present

## 2015-09-12 DIAGNOSIS — G894 Chronic pain syndrome: Secondary | ICD-10-CM | POA: Diagnosis not present

## 2015-09-12 DIAGNOSIS — M461 Sacroiliitis, not elsewhere classified: Secondary | ICD-10-CM | POA: Diagnosis not present

## 2015-09-12 DIAGNOSIS — M5416 Radiculopathy, lumbar region: Secondary | ICD-10-CM | POA: Diagnosis not present

## 2015-09-19 DIAGNOSIS — L603 Nail dystrophy: Secondary | ICD-10-CM | POA: Diagnosis not present

## 2015-09-19 DIAGNOSIS — L84 Corns and callosities: Secondary | ICD-10-CM | POA: Diagnosis not present

## 2015-09-19 DIAGNOSIS — I739 Peripheral vascular disease, unspecified: Secondary | ICD-10-CM | POA: Diagnosis not present

## 2015-09-26 DIAGNOSIS — F339 Major depressive disorder, recurrent, unspecified: Secondary | ICD-10-CM | POA: Diagnosis not present

## 2015-10-03 DIAGNOSIS — F339 Major depressive disorder, recurrent, unspecified: Secondary | ICD-10-CM | POA: Diagnosis not present

## 2015-10-10 DIAGNOSIS — F339 Major depressive disorder, recurrent, unspecified: Secondary | ICD-10-CM | POA: Diagnosis not present

## 2015-10-11 DIAGNOSIS — M461 Sacroiliitis, not elsewhere classified: Secondary | ICD-10-CM | POA: Diagnosis not present

## 2015-10-11 DIAGNOSIS — M5416 Radiculopathy, lumbar region: Secondary | ICD-10-CM | POA: Diagnosis not present

## 2015-10-11 DIAGNOSIS — G894 Chronic pain syndrome: Secondary | ICD-10-CM | POA: Diagnosis not present

## 2015-10-11 DIAGNOSIS — M47812 Spondylosis without myelopathy or radiculopathy, cervical region: Secondary | ICD-10-CM | POA: Diagnosis not present

## 2015-10-17 DIAGNOSIS — F339 Major depressive disorder, recurrent, unspecified: Secondary | ICD-10-CM | POA: Diagnosis not present

## 2015-10-20 DIAGNOSIS — K5900 Constipation, unspecified: Secondary | ICD-10-CM | POA: Diagnosis not present

## 2015-10-20 DIAGNOSIS — J029 Acute pharyngitis, unspecified: Secondary | ICD-10-CM | POA: Diagnosis not present

## 2015-10-24 DIAGNOSIS — F339 Major depressive disorder, recurrent, unspecified: Secondary | ICD-10-CM | POA: Diagnosis not present

## 2015-10-31 DIAGNOSIS — F339 Major depressive disorder, recurrent, unspecified: Secondary | ICD-10-CM | POA: Diagnosis not present

## 2015-11-02 ENCOUNTER — Emergency Department (HOSPITAL_COMMUNITY): Payer: Medicare Other

## 2015-11-02 ENCOUNTER — Encounter (HOSPITAL_COMMUNITY): Payer: Self-pay | Admitting: Vascular Surgery

## 2015-11-02 ENCOUNTER — Observation Stay (HOSPITAL_COMMUNITY)
Admission: EM | Admit: 2015-11-02 | Discharge: 2015-11-03 | Disposition: A | Discharge status: Home / Self Care | Payer: Medicare Other | Attending: Internal Medicine | Admitting: Internal Medicine

## 2015-11-02 ENCOUNTER — Other Ambulatory Visit: Payer: Self-pay

## 2015-11-02 DIAGNOSIS — R0602 Shortness of breath: Secondary | ICD-10-CM | POA: Diagnosis not present

## 2015-11-02 DIAGNOSIS — F329 Major depressive disorder, single episode, unspecified: Secondary | ICD-10-CM | POA: Insufficient documentation

## 2015-11-02 DIAGNOSIS — F32A Depression, unspecified: Secondary | ICD-10-CM | POA: Diagnosis present

## 2015-11-02 DIAGNOSIS — F419 Anxiety disorder, unspecified: Secondary | ICD-10-CM | POA: Diagnosis present

## 2015-11-02 DIAGNOSIS — Z7901 Long term (current) use of anticoagulants: Secondary | ICD-10-CM | POA: Diagnosis not present

## 2015-11-02 DIAGNOSIS — Z853 Personal history of malignant neoplasm of breast: Secondary | ICD-10-CM | POA: Diagnosis not present

## 2015-11-02 DIAGNOSIS — R1084 Generalized abdominal pain: Secondary | ICD-10-CM | POA: Diagnosis not present

## 2015-11-02 DIAGNOSIS — C50919 Malignant neoplasm of unspecified site of unspecified female breast: Secondary | ICD-10-CM | POA: Diagnosis present

## 2015-11-02 DIAGNOSIS — R404 Transient alteration of awareness: Secondary | ICD-10-CM | POA: Diagnosis not present

## 2015-11-02 DIAGNOSIS — E785 Hyperlipidemia, unspecified: Secondary | ICD-10-CM | POA: Diagnosis not present

## 2015-11-02 DIAGNOSIS — J9601 Acute respiratory failure with hypoxia: Secondary | ICD-10-CM

## 2015-11-02 DIAGNOSIS — E039 Hypothyroidism, unspecified: Secondary | ICD-10-CM | POA: Diagnosis present

## 2015-11-02 DIAGNOSIS — I10 Essential (primary) hypertension: Secondary | ICD-10-CM | POA: Diagnosis not present

## 2015-11-02 DIAGNOSIS — Z7982 Long term (current) use of aspirin: Secondary | ICD-10-CM | POA: Diagnosis not present

## 2015-11-02 DIAGNOSIS — I480 Paroxysmal atrial fibrillation: Secondary | ICD-10-CM | POA: Diagnosis present

## 2015-11-02 DIAGNOSIS — I872 Venous insufficiency (chronic) (peripheral): Secondary | ICD-10-CM | POA: Diagnosis present

## 2015-11-02 DIAGNOSIS — J9691 Respiratory failure, unspecified with hypoxia: Secondary | ICD-10-CM | POA: Diagnosis present

## 2015-11-02 DIAGNOSIS — R079 Chest pain, unspecified: Secondary | ICD-10-CM | POA: Diagnosis not present

## 2015-11-02 DIAGNOSIS — G8929 Other chronic pain: Secondary | ICD-10-CM | POA: Diagnosis present

## 2015-11-02 DIAGNOSIS — R42 Dizziness and giddiness: Secondary | ICD-10-CM | POA: Diagnosis not present

## 2015-11-02 DIAGNOSIS — R0789 Other chest pain: Secondary | ICD-10-CM | POA: Diagnosis not present

## 2015-11-02 HISTORY — DX: Unspecified atrial fibrillation: I48.91

## 2015-11-02 HISTORY — DX: Disorder of bone density and structure, unspecified: M85.9

## 2015-11-02 HISTORY — DX: Retention of urine, unspecified: R33.9

## 2015-11-02 HISTORY — DX: Fibromyalgia: M79.7

## 2015-11-02 HISTORY — DX: Malignant neoplasm of unspecified site of unspecified female breast: C50.919

## 2015-11-02 HISTORY — DX: Hyperlipidemia, unspecified: E78.5

## 2015-11-02 HISTORY — DX: Constipation, unspecified: K59.00

## 2015-11-02 HISTORY — DX: Hyperkalemia: E87.5

## 2015-11-02 HISTORY — DX: Impaired fasting glucose: R73.01

## 2015-11-02 LAB — URINALYSIS, ROUTINE W REFLEX MICROSCOPIC
Bilirubin Urine: NEGATIVE
GLUCOSE, UA: NEGATIVE mg/dL
Hgb urine dipstick: NEGATIVE
Ketones, ur: NEGATIVE mg/dL
LEUKOCYTES UA: NEGATIVE
NITRITE: NEGATIVE
PROTEIN: NEGATIVE mg/dL
Specific Gravity, Urine: 1.009 (ref 1.005–1.030)
pH: 7 (ref 5.0–8.0)

## 2015-11-02 LAB — I-STAT TROPONIN, ED: TROPONIN I, POC: 0 ng/mL (ref 0.00–0.08)

## 2015-11-02 LAB — BASIC METABOLIC PANEL
Anion gap: 9 (ref 5–15)
BUN: 18 mg/dL (ref 6–20)
CALCIUM: 8.5 mg/dL — AB (ref 8.9–10.3)
CO2: 27 mmol/L (ref 22–32)
Chloride: 100 mmol/L — ABNORMAL LOW (ref 101–111)
Creatinine, Ser: 0.66 mg/dL (ref 0.44–1.00)
GFR calc Af Amer: >60 mL/min (ref >60)
GFR calc non Af Amer: >60 mL/min (ref >60)
GLUCOSE: 102 mg/dL — AB (ref 65–99)
Potassium: 4.2 mmol/L (ref 3.5–5.1)
Sodium: 136 mmol/L (ref 135–145)

## 2015-11-02 LAB — CBC
HCT: 41 % (ref 36.0–46.0)
Hemoglobin: 12.7 g/dL (ref 12.0–15.0)
MCH: 26.5 pg (ref 26.0–34.0)
MCHC: 31 g/dL (ref 30.0–36.0)
MCV: 85.6 fL (ref 78.0–100.0)
Platelets: 204 10*3/uL (ref 150–400)
RBC: 4.79 MIL/uL (ref 3.87–5.11)
RDW: 14.8 % (ref 11.5–15.5)
WBC: 7 10*3/uL (ref 4.0–10.5)

## 2015-11-02 LAB — TROPONIN I

## 2015-11-02 LAB — D-DIMER, QUANTITATIVE (NOT AT ARMC): D DIMER QUANT: 0.72 ug{FEU}/mL — AB (ref 0.00–0.50)

## 2015-11-02 LAB — BRAIN NATRIURETIC PEPTIDE: B NATRIURETIC PEPTIDE 5: 119.8 pg/mL — AB (ref 0.0–100.0)

## 2015-11-02 MED ORDER — CALCIUM CARBONATE 1250 (500 CA) MG PO TABS
2.0000 | ORAL_TABLET | Freq: Every day | ORAL | Status: DC
  Administered 2015-11-03: 1000 mg via ORAL
  Filled 2015-11-02 (×2): qty 1

## 2015-11-02 MED ORDER — GABAPENTIN (ONCE-DAILY) 600 MG PO TABS: 1.0000 | ORAL_TABLET | Freq: Every day | ORAL | Status: DC

## 2015-11-02 MED ORDER — VITAMIN D 1000 UNITS PO TABS
1000.0000 [IU] | ORAL_TABLET | Freq: Every day | ORAL | Status: DC
  Administered 2015-11-03: 1000 [IU] via ORAL
  Filled 2015-11-02: qty 1

## 2015-11-02 MED ORDER — TAMOXIFEN CITRATE 10 MG PO TABS
10.0000 mg | ORAL_TABLET | Freq: Every day | ORAL | Status: DC
  Administered 2015-11-03: 10 mg via ORAL
  Filled 2015-11-02: qty 1

## 2015-11-02 MED ORDER — ONDANSETRON HCL 4 MG/2ML IJ SOLN: 4.0000 mg | Freq: Four times a day (QID) | INTRAMUSCULAR | Status: DC | PRN

## 2015-11-02 MED ORDER — CETYLPYRIDINIUM CHLORIDE 0.05 % MT LIQD
7.0000 mL | Freq: Two times a day (BID) | OROMUCOSAL | Status: DC
Start: 2015-11-02 — End: 2015-11-03

## 2015-11-02 MED ORDER — OXYCODONE HCL ER 10 MG PO T12A
10.0000 mg | EXTENDED_RELEASE_TABLET | Freq: Two times a day (BID) | ORAL | Status: DC
Start: 2015-11-02 — End: 2015-11-03
  Administered 2015-11-02 – 2015-11-03 (×2): 10 mg via ORAL
  Filled 2015-11-02 (×2): qty 1

## 2015-11-02 MED ORDER — SODIUM CHLORIDE 0.9 % IV SOLN: 250.0000 mL | INTRAVENOUS | Status: DC | PRN

## 2015-11-02 MED ORDER — ACETAMINOPHEN 325 MG PO TABS: 650.0000 mg | ORAL_TABLET | Freq: Four times a day (QID) | ORAL | Status: DC | PRN

## 2015-11-02 MED ORDER — THERA M PLUS PO TABS: 1.0000 | ORAL_TABLET | Freq: Every day | ORAL | Status: DC

## 2015-11-02 MED ORDER — OXYCODONE HCL ER 10 MG PO T12A: 10.0000 mg | EXTENDED_RELEASE_TABLET | Freq: Three times a day (TID) | ORAL | Status: DC | PRN

## 2015-11-02 MED ORDER — ASPIRIN EC 81 MG PO TBEC: 324.0000 mg | DELAYED_RELEASE_TABLET | Freq: Once | ORAL | Status: DC

## 2015-11-02 MED ORDER — SODIUM CHLORIDE 0.9% FLUSH
3.0000 mL | Freq: Two times a day (BID) | INTRAVENOUS | Status: DC
  Administered 2015-11-03: 3 mL via INTRAVENOUS

## 2015-11-02 MED ORDER — GABAPENTIN 300 MG PO CAPS
600.0000 mg | ORAL_CAPSULE | Freq: Three times a day (TID) | ORAL | Status: DC
  Administered 2015-11-02 – 2015-11-03 (×3): 600 mg via ORAL
  Filled 2015-11-02 (×3): qty 2

## 2015-11-02 MED ORDER — ONDANSETRON HCL 4 MG PO TABS: 4.0000 mg | ORAL_TABLET | Freq: Four times a day (QID) | ORAL | Status: DC | PRN

## 2015-11-02 MED ORDER — AMIODARONE HCL 200 MG PO TABS
200.0000 mg | ORAL_TABLET | Freq: Every day | ORAL | Status: DC
  Administered 2015-11-03: 200 mg via ORAL
  Filled 2015-11-02: qty 1

## 2015-11-02 MED ORDER — BENAZEPRIL HCL 20 MG PO TABS
20.0000 mg | ORAL_TABLET | Freq: Every day | ORAL | Status: DC
  Administered 2015-11-03: 20 mg via ORAL
  Filled 2015-11-02: qty 1

## 2015-11-02 MED ORDER — BISACODYL 5 MG PO TBEC: 5.0000 mg | DELAYED_RELEASE_TABLET | Freq: Every day | ORAL | Status: DC | PRN

## 2015-11-02 MED ORDER — FUROSEMIDE 80 MG PO TABS
80.0000 mg | ORAL_TABLET | ORAL | Status: DC
  Administered 2015-11-03: 80 mg via ORAL
  Filled 2015-11-02: qty 1

## 2015-11-02 MED ORDER — BACLOFEN 10 MG PO TABS
10.0000 mg | ORAL_TABLET | Freq: Three times a day (TID) | ORAL | Status: DC
  Administered 2015-11-02 – 2015-11-03 (×2): 10 mg via ORAL
  Filled 2015-11-02 (×2): qty 1

## 2015-11-02 MED ORDER — APIXABAN 5 MG PO TABS
5.0000 mg | ORAL_TABLET | Freq: Two times a day (BID) | ORAL | Status: DC
  Administered 2015-11-02 – 2015-11-03 (×2): 5 mg via ORAL
  Filled 2015-11-02 (×2): qty 1

## 2015-11-02 MED ORDER — ADULT MULTIVITAMIN W/MINERALS CH
1.0000 | ORAL_TABLET | Freq: Every day | ORAL | Status: DC
  Administered 2015-11-03: 1 via ORAL
  Filled 2015-11-02: qty 1

## 2015-11-02 MED ORDER — AMLODIPINE BESYLATE 10 MG PO TABS
10.0000 mg | ORAL_TABLET | Freq: Every day | ORAL | Status: DC
Start: 2015-11-03 — End: 2015-11-03
  Administered 2015-11-03: 10 mg via ORAL
  Filled 2015-11-02: qty 1

## 2015-11-02 MED ORDER — IOPAMIDOL (ISOVUE-370) INJECTION 76%
INTRAVENOUS | Status: AC
  Administered 2015-11-02: 100 mL
  Filled 2015-11-02: qty 100

## 2015-11-02 MED ORDER — ASPIRIN EC 81 MG PO TBEC
81.0000 mg | DELAYED_RELEASE_TABLET | Freq: Every day | ORAL | Status: DC
  Administered 2015-11-03: 81 mg via ORAL
  Filled 2015-11-02: qty 1

## 2015-11-02 MED ORDER — VENLAFAXINE HCL ER 150 MG PO CP24
150.0000 mg | ORAL_CAPSULE | Freq: Every day | ORAL | Status: DC
  Administered 2015-11-03: 150 mg via ORAL
  Filled 2015-11-02: qty 1

## 2015-11-02 MED ORDER — SODIUM CHLORIDE 0.9% FLUSH: 3.0000 mL | INTRAVENOUS | Status: DC | PRN

## 2015-11-02 MED ORDER — POLYETHYLENE GLYCOL 3350 17 G PO PACK: 17.0000 g | PACK | Freq: Every day | ORAL | Status: DC | PRN

## 2015-11-02 MED ORDER — LEVOTHYROXINE SODIUM 50 MCG PO TABS
50.0000 ug | ORAL_TABLET | Freq: Every day | ORAL | Status: DC
  Administered 2015-11-03: 50 ug via ORAL
  Filled 2015-11-02 (×2): qty 1

## 2015-11-02 MED ORDER — FUROSEMIDE 10 MG/ML IJ SOLN
40.0000 mg | Freq: Once | INTRAMUSCULAR | Status: AC
  Administered 2015-11-02: 40 mg via INTRAVENOUS
  Filled 2015-11-02: qty 4

## 2015-11-02 NOTE — Progress Notes (Signed)
Admitted patient to room 6E17 under the services of Dr. Myna Hidalgo with the diagnosis of respiratory failure. Patient is alert and oriented x 4. Patient live in an [facility] in [place]. DNR patient with out of facility DNR form. Skin warm and dry with swelling to BLE. Needs attended. Oriented to room and call bell system. Paged MD for DNR order.

## 2015-11-02 NOTE — ED Notes (Signed)
Pt reports to the ED from [facility] assisted living for eval of dizziness and left arm and left upper chest pain. Symptom onset 12 hrs ago. Denies any dizziness upon standing. Denies any SOB or active vomiting. She reports some nausea but she is always nauseated. No orthostatic changes noted en route. 12 lead showed atrial fibrillation rate controlled. Pt received 324 of ASA and no nitro en route. Pt A&O at baseline, resp e/u, and skin warm and dry.

## 2015-11-02 NOTE — H&P (Signed)
History and Physical    [name] [id number] DOB: [birth year] DOA: 11/02/2015  PCP: [name], MD   Patient coming from: ALF  Chief Complaint: Lightheadedness   HPI: [name] is a 82 y.o. female with medical history significant for paroxysmal atrial fibrillation on Eliquis, depression, anxiety, hypothyroidism, chronic venous stasis, and hypertension who presents to the ED from her assisted living facility with 1 day of lightheadedness. Patient reports pain in her usual state of health until the early morning hours of 11/02/2015 when she noted fairly acute onset of lightheadedness while at rest. This worsened upon standing, persisted throughout the day of her presentation and was associated with some pain in the left arm and upper chest. Patient endorsed some associated nausea, but reported that this is chronic. Later in her ED visit, patient denied any pain in the left arm or chest which had been documented earlier in a nursing note. She denied any dyspnea, cough, or unilateral leg swelling or tenderness. She also denied fever, chills, abdominal pain, vomiting, or diarrhea. EMS was activated for transport to the hospital and she was given a 324 mg aspirin chew en route.  ED Course: Upon arrival to the ED, patient is found to be bradycardic to the 50s, saturating as low as 83% on room air, intermittently tachypneic, and with stable blood pressure. EKG features a sinus rhythm with low voltage QRS and minimal/borderline ST elevation in inferior leads which is not significantly changed from prior EKGs. Troponin was 0.00 and BNP  119.8. Chest x-ray was negative for acute cardiopulmonary disease. BMP and CBC are unremarkable, as is urinalysis. Head CT was obtained and negative for acute intracranial abnormality. Patient had persistent hypoxia and was placed on supplemental oxygen at a rate of 2 L/m via nasal cannula. D-dimer returned elevated to a value of 0.72 and the patient was sent  for CTA PE study. CTA is negative for pulmonary embolism, pulmonary edema, or infiltrate. CT is notable only for atelectasis.patient remained quite stable in the emergency department with resolution of her presenting complaints, but  She continued to require 2 L/m of supplemental oxygen in order to maintain O2 saturations in the 90s. Home O2 cannot be arranged at this hour and the patient will be observed in the hospital for ongoing evaluation and management of new oxygen requirement of uncertain etiology.  Review of Systems:  All other systems reviewed and apart from HPI, are negative.  Past Medical History  Diagnosis Date  . Scoliosis   . Depression   . Breast cancer ([place])   . Hypertension   . Thyroid disease   . Anxiety   . Disorder of bone density and structure, unspecified   . Breast CA ([place])   . Hyperlipemia   . Hyperkalemia   . Atrial fibrillation ([place])   . Constipation   . Fibromyalgia   . Impaired fasting glucose   . Retention of urine, unspecified     Past Surgical History  Procedure Laterality Date  . Hip fracture surgery    . Fracture surgery       reports that she has never smoked. She does not have any smokeless tobacco history on file. She reports that she does not drink alcohol or use illicit drugs.  Allergies  Allergen Reactions  . Sulfa Antibiotics Other (See Comments)    Per mar    History reviewed. No pertinent family history.   Prior to Admission medications   Medication Sig Start Date End Date Taking? Authorizing Provider  acetaminophen (TYLENOL) 325 MG tablet Take 325 mg by mouth every 4 (four) hours as needed. For pain/headache/fever    Yes Historical Provider, MD  alendronate (FOSAMAX) 70 MG tablet Take 70 mg by mouth once a week. Take with a full glass of water on an empty stomach.   Yes Historical Provider, MD  amiodarone (PACERONE) 200 MG tablet Take 200 mg by mouth daily.     Yes Historical Provider, MD  amLODipine (NORVASC) 10 MG tablet Take  10 mg by mouth daily.     Yes Historical Provider, MD  apixaban (ELIQUIS) 5 MG TABS tablet Take 5 mg by mouth 2 (two) times daily.   Yes Historical Provider, MD  aspirin EC 81 MG tablet Take 324 mg by mouth once.   Yes Historical Provider, MD  baclofen (LIORESAL) 10 MG tablet Take 10 mg by mouth 3 (three) times daily.     Yes Historical Provider, MD  benazepril (LOTENSIN) 20 MG tablet Take 20 mg by mouth at bedtime.    Yes Historical Provider, MD  calcium carbonate (OSCAL) 1500 (600 Ca) MG TABS tablet Take 1,200 mg of elemental calcium by mouth daily with breakfast.   Yes Historical Provider, MD  cholecalciferol (VITAMIN D) 1000 units tablet Take 1,000 Units by mouth daily.   Yes Historical Provider, MD  docusate sodium (COLACE) 100 MG capsule Take 100 mg by mouth daily. & as needed for constipation    Yes Historical Provider, MD  furosemide (LASIX) 80 MG tablet Take 80 mg by mouth every morning.     Yes Historical Provider, MD  Gabapentin, PHN, (GRALISE) 600 MG TABS Take 1-2 tablets by mouth at bedtime. 2 tablets at bedtime, 1 tablet 3 times daily   Yes Historical Provider, MD  levothyroxine (SYNTHROID, LEVOTHROID) 50 MCG tablet Take 50 mcg by mouth daily.     Yes Historical Provider, MD  loperamide (IMODIUM) 2 MG capsule Take 2 mg by mouth 4 (four) times daily as needed. For loose stools/diarrhea    Yes Historical Provider, MD  Magnesium Sulfate, Laxative, (EPSOM SALT PO) Take 1 Dose by mouth at bedtime.   Yes Historical Provider, MD  menthol-zinc oxide (GOLD BOND) powder Apply 1 application topically daily.   Yes Historical Provider, MD  Multiple Vitamins-Minerals (MULTIVITAMINS THER. W/MINERALS) TABS Take 1 tablet by mouth daily.     Yes Historical Provider, MD  OxyCODONE (OXYCONTIN) 10 mg T12A 12 hr tablet Take 10 mg by mouth every 4 (four) hours as needed (pain).   Yes Historical Provider, MD  polyethylene glycol (MIRALAX / GLYCOLAX) packet Take 17 g by mouth daily as needed. For constipation.     Yes Historical Provider, MD  potassium chloride SA (K-DUR,KLOR-CON) 20 MEQ tablet Take 20 mEq by mouth daily.     Yes Historical Provider, MD  tamoxifen (NOLVADEX) 10 MG tablet Take 10 mg by mouth daily.     Yes Historical Provider, MD  venlafaxine (EFFEXOR-XR) 150 MG 24 hr capsule Take 150 mg by mouth daily.     Yes Historical Provider, MD    Physical Exam: Filed Vitals:   11/02/15 2100 11/02/15 2115 11/02/15 2130 11/02/15 2145  BP: 117/57 113/50 159/62 145/62  Pulse: 49 48 58 56  Resp: 16 13 14 10   SpO2: 96% 96% 92% 96%      Constitutional: NAD, calm, comfortable Eyes: PERTLA, lids and conjunctivae normal ENMT: Mucous membranes are moist. Posterior pharynx clear of any exudate or lesions.   Neck: normal, supple, no masses, no  thyromegaly Respiratory: clear to auscultation bilaterally, no wheezing, no crackles. Normal respiratory effort.   Cardiovascular: S1 & S2 heard, regular rate and rhythm, soft systolic murmur at USB. 2+ pretibial edema b/l. No significant JVD. Abdomen: No distension, no tenderness, no masses palpated. Bowel sounds normal.  Musculoskeletal: no clubbing / cyanosis. No joint deformity upper and lower extremities. Normal muscle tone.  Skin: no significant rashes, lesions, ulcers. Warm, dry, well-perfused. Neurologic: CN 2-12 grossly intact. Sensation intact, DTR normal. Strength 5/5 in all 4 limbs.  Psychiatric: Normal judgment and insight. Alert and oriented x 3. Anxious, tearful.     Labs on Admission: I have personally reviewed following labs and imaging studies  CBC:  Recent Labs Lab 11/02/15 1245  WBC 7.0  HGB 12.7  HCT 41.0  MCV 85.6  PLT 0000000   Basic Metabolic Panel:  Recent Labs Lab 11/02/15 1245  NA 136  K 4.2  CL 100*  CO2 27  GLUCOSE 102*  BUN 18  CREATININE 0.66  CALCIUM 8.5*   GFR: CrCl cannot be calculated (Unknown ideal weight.). Liver Function Tests: No results for input(s): AST, ALT, ALKPHOS, BILITOT, PROT, ALBUMIN in  the last 168 hours. No results for input(s): LIPASE, AMYLASE in the last 168 hours. No results for input(s): AMMONIA in the last 168 hours. Coagulation Profile: No results for input(s): INR, PROTIME in the last 168 hours. Cardiac Enzymes: No results for input(s): CKTOTAL, CKMB, CKMBINDEX, TROPONINI in the last 168 hours. BNP (last 3 results) No results for input(s): PROBNP in the last 8760 hours. HbA1C: No results for input(s): HGBA1C in the last 72 hours. CBG: No results for input(s): GLUCAP in the last 168 hours. Lipid Profile: No results for input(s): CHOL, HDL, LDLCALC, TRIG, CHOLHDL, LDLDIRECT in the last 72 hours. Thyroid Function Tests: No results for input(s): TSH, T4TOTAL, FREET4, T3FREE, THYROIDAB in the last 72 hours. Anemia Panel: No results for input(s): VITAMINB12, FOLATE, FERRITIN, TIBC, IRON, RETICCTPCT in the last 72 hours. Urine analysis:    Component Value Date/Time   COLORURINE YELLOW 11/02/2015 1342   APPEARANCEUR CLEAR 11/02/2015 1342   LABSPEC 1.009 11/02/2015 1342   PHURINE 7.0 11/02/2015 1342   GLUCOSEU NEGATIVE 11/02/2015 1342   HGBUR NEGATIVE 11/02/2015 1342   BILIRUBINUR NEGATIVE 11/02/2015 1342   KETONESUR NEGATIVE 11/02/2015 1342   PROTEINUR NEGATIVE 11/02/2015 1342   UROBILINOGEN 1.0 03/07/2011 0044   NITRITE NEGATIVE 11/02/2015 1342   LEUKOCYTESUR NEGATIVE 11/02/2015 1342   Sepsis Labs: @LABRCNTIP (procalcitonin:4,lacticidven:4) )No results found for this or any previous visit (from the past 240 hour(s)).   Radiological Exams on Admission: Dg Chest 2 View  11/02/2015  CLINICAL DATA:  Dizziness with left arm and left upper chest pain today. EXAM: CHEST  2 VIEW COMPARISON:  Radiographs 02/25/2013. FINDINGS: The heart size and mediastinal contours are stable. There is aortic atherosclerosis with a probable hiatal hernia. There is chronic central airway and interstitial thickening. No airspace disease, edema or significant pleural effusion. The  bones appear unchanged. There are glenohumeral degenerative changes, worse on the right. IMPRESSION: No acute cardiopulmonary process. Stable chronic lung disease. Aortic atherosclerosis. Electronically Signed   By: Richardean Sale M.D.   On: 11/02/2015 13:23   Ct Head Wo Contrast  11/02/2015  CLINICAL DATA:  Dizziness, onset today.  History of hypertension. EXAM: CT HEAD WITHOUT CONTRAST TECHNIQUE: Contiguous axial images were obtained from the base of the skull through the vertex without intravenous contrast. COMPARISON:  11/01/2013 FINDINGS: The brainstem, cerebellum, cerebral peduncles, thalami, basal  ganglia, basilar cisterns, and ventricular system appear within normal limits. Periventricular white matter and corona radiata hypodensities favor chronic ischemic microvascular white matter disease. No intracranial hemorrhage, mass lesion, or acute CVA. IMPRESSION: 1. No specific abnormality to account for the patient's new dizziness. 2. Periventricular white matter and corona radiata hypodensities favor chronic ischemic microvascular white matter disease. Electronically Signed   By: Van Clines M.D.   On: 11/02/2015 15:05   Ct Angio Chest Pe W/cm &/or Wo Cm  11/02/2015  CLINICAL DATA:  Pt reports to the ED from [facility] assisted living for eval of dizziness and left arm and left upper chest pain. Symptom onset 12 hrs ago. Denies any dizziness upon standing. Denies any SOB or active vomiting. She reports some nausea but she is always nauseated. No orthostatic changes noted en route. 12 lead showed atrial fibrillation rate controlled. History of breast carcinoma and hypertension. EXAM: CT ANGIOGRAPHY CHEST WITH CONTRAST TECHNIQUE: Multidetector CT imaging of the chest was performed using the standard protocol during bolus administration of intravenous contrast. Multiplanar CT image reconstructions and MIPs were obtained to evaluate the vascular anatomy. CONTRAST:  60 mL of Isovue 370 intravenous  contrast COMPARISON:  Current chest radiograph FINDINGS: Angiographic study: There is no evidence of a pulmonary embolism. Minor atherosclerotic calcification along the aortic arch. No aortic dissection. Neck base and axilla:  No mass or adenopathy. Mediastinum and hila: Mild cardiomegaly. No significant coronary artery calcifications. No mediastinal or hilar masses or enlarged lymph nodes. Small to moderate hiatal hernia. Lungs and pleura: No evidence of pneumonia or pulmonary edema. Prior peripheral reticular opacities consistent with mild peripheral parenchymal scarring. Focal opacity is noted in the right middle lobe adjacent to the right heart border consistent with atelectasis. There is mild subsegmental atelectasis in the lung bases. No lung mass or suspicious nodule. No pleural effusion or pneumothorax. Limited upper abdomen:  No acute findings. Musculoskeletal: No osteoblastic or osteolytic lesions. Scoliosis of the lower thoracic and lumbar spine. Review of the MIP images confirms the above findings. IMPRESSION: 1. No evidence of a pulmonary embolus. 2. No acute findings. 3. Mild cardiomegaly.  Small hiatal hernia. Electronically Signed   By: Lajean Manes M.D.   On: 11/02/2015 19:50    EKG: Independently reviewed. Sinus rhythm, low-voltage QRS, minimal ST-elevation inferior leads; not significantly changed from priors  Assessment/Plan  1. Respiratory failure with hypoxia  - Uncertain etiology, may be chronic?, secondary to atelectasis? - CXR and CTA PE study notable for atelectasis only  - Sat drops to 80's on rm air, but pt largely asymptomatic  - Home O2 could not be arranged overnight, CM consultation requested  - Continuous pulse oximetry with titration of FiO2 to maintain sats >92%  - Incentive spirometry    2. Atrial fibrillation, paroxysmal  - In sinus rhythm at time of admission  - CHADS-VASc at least 3 (age x2, gender)  - Continue current-management with Eliquis and  amiodarone  3. Depression, anxiety  - Pt tearful during interview, endorses anxiety regarding current hospital stay  - Continue current-management with Effexor    4. Hypertension  - At goal at time of admission  - Continue current management with benazepril, Norvasc, and Lasix    5. Hypothyroidism   - Appears to be stable  - Continue current-dose Synthroid    6. Hx of breast cancer  - Continue Tamoxifen     DVT prophylaxis: Eliquis  Code Status: Full Family Communication: Discussed with patient  Disposition Plan: Observe on  med-surg  Consults called: None  Admission status: Observation     Vianne Bulls, MD [facility] Pager [phone]  If 7PM-7AM, please contact night-coverage [email or web address] Password [facility]  11/02/2015, 9:55 PM

## 2015-11-02 NOTE — ED Notes (Signed)
PA at bedside.

## 2015-11-02 NOTE — Discharge Instructions (Signed)
Read the information below.   Your labs and imaging are re-assuring.  Use the prescribed medication as directed.  Please discuss all new medications with your pharmacist.   You may return to the Emergency Department at any time for worsening condition or any new symptoms that concern you. Return to ED if you develop chest pain, shortness of breath, loss of consciousness, facial droop, numbness/weakness.    Dizziness Dizziness is a common problem. It makes you feel unsteady or lightheaded. You may feel like you are about to pass out (faint). Dizziness can lead to injury if you stumble or fall. Anyone can get dizzy, but dizziness is more common in older adults. This condition can be caused by a number of things, including:  Medicines.  Dehydration.  Illness. HOME CARE Following these instructions may help with your condition: Eating and Drinking  Drink enough fluid to keep your pee (urine) clear or pale yellow. This helps to keep you from getting dehydrated. Try to drink more clear fluids, such as water.  Do not drink alcohol.  Limit how much caffeine you drink or eat if told by your doctor.  Limit how much salt you drink or eat if told by your doctor. Activity  Avoid making quick movements.  When you stand up from sitting in a chair, steady yourself until you feel okay.  In the morning, first sit up on the side of the bed. When you feel okay, stand slowly while you hold onto something. Do this until you know that your balance is fine.  Move your legs often if you need to stand in one place for a long time. Tighten and relax your muscles in your legs while you are standing.  Do not drive or use heavy machinery if you feel dizzy.  Avoid bending down if you feel dizzy. Place items in your home so that they are easy for you to reach without leaning over. Lifestyle  Do not use any tobacco products, including cigarettes, chewing tobacco, or electronic cigarettes. If you need help  quitting, ask your doctor.  Try to lower your stress level, such as with yoga or meditation. Talk with your doctor if you need help. General Instructions  Watch your dizziness for any changes.  Take medicines only as told by your doctor. Talk with your doctor if you think that your dizziness is caused by a medicine that you are taking.  Tell a friend or a family member that you are feeling dizzy. If he or she notices any changes in your behavior, have this person call your doctor.  Keep all follow-up visits as told by your doctor. This is important. GET HELP IF:  Your dizziness does not go away.  Your dizziness or light-headedness gets worse.  You feel sick to your stomach (nauseous).  You have trouble hearing.  You have new symptoms.  You are unsteady on your feet or you feel like the room is spinning. GET HELP RIGHT AWAY IF:  You throw up (vomit) or have diarrhea and are unable to eat or drink anything.  You have trouble:  Talking.  Walking.  Swallowing.  Using your arms, hands, or legs.  You feel generally weak.  You are not thinking clearly or you have trouble forming sentences. It may take a friend or family member to notice this.  You have:  Chest pain.  Pain in your belly (abdomen).  Shortness of breath.  Sweating.  Your vision changes.  You are bleeding.  You have  a headache.  You have neck pain or a stiff neck.  You have a fever.   This information is not intended to replace advice given to you by your health care provider. Make sure you discuss any questions you have with your health care provider.   Document Released: 04/19/2011 Document Revised: 09/14/2014 Document Reviewed: 04/26/2014 Elsevier Interactive Patient Education [employer].

## 2015-11-02 NOTE — ED Notes (Signed)
Phlebotomy at bedside at this time.

## 2015-11-02 NOTE — Progress Notes (Signed)
ER RN called for report. Room ready for admit.  

## 2015-11-02 NOTE — ED Notes (Signed)
Patient transported to CT via stretcher.

## 2015-11-02 NOTE — ED Provider Notes (Signed)
CSN: [id number]     Arrival date & time 11/02/15  1237 History   First MD Initiated Contact with Patient 11/02/15 1303     Chief Complaint  Patient presents with  . Dizziness     (Consider location/radiation/quality/duration/timing/severity/associated sxs/prior Treatment) HPI Comments: [name] is a 82 y.o. Female with history of atrial fibrillation on eliquis, depression, anxiety, HTN, fibromyalgia presents to ED from [facility] via EMS for dizziness. Patient reports today she started feeling unbalanced on her feet and "dizzy" with standing. She has an associated frontal headache, unable to provide pain level that started this morning around 10:30 am. She denies any falls or head trauma. No lightheadedness, numbness, weakness, fever, chills, night sweats, changes in vision, neck pain. She does state she has some trouble swallowing; however, this is chronic. She also notes some shortness of breath. She denies chest pain.    Patient is a 82 y.o. female presenting with dizziness. The history is provided by the patient and medical records.  Dizziness Associated symptoms: headaches ( frontal, no fall, no LOC), nausea ( chronic), shortness of breath and vomiting ( x 1 yesterday)     Past Medical History  Diagnosis Date  . Scoliosis   . Depression   . Breast cancer ([place])   . Hypertension   . Thyroid disease   . Anxiety   . Disorder of bone density and structure, unspecified   . Breast CA ([place])   . Hyperlipemia   . Hyperkalemia   . Atrial fibrillation ([place])   . Constipation   . Fibromyalgia   . Impaired fasting glucose   . Retention of urine, unspecified    Past Surgical History  Procedure Laterality Date  . Hip fracture surgery    . Fracture surgery     No family history on file. Social History  Substance Use Topics  . Smoking status: Never Smoker   . Smokeless tobacco: None  . Alcohol Use: No   OB History    No data available     Review of Systems   Respiratory: Positive for shortness of breath.   Gastrointestinal: Positive for nausea ( chronic), vomiting ( x 1 yesterday) and abdominal pain ( mild).  Neurological: Positive for dizziness and headaches ( frontal, no fall, no LOC).  All other systems reviewed and are negative.     Allergies  Sulfa antibiotics  Home Medications   Prior to Admission medications   Medication Sig Start Date End Date Taking? Authorizing Provider  acetaminophen (TYLENOL) 325 MG tablet Take 325 mg by mouth every 4 (four) hours as needed. For pain/headache/fever    Yes Historical Provider, MD  alendronate (FOSAMAX) 70 MG tablet Take 70 mg by mouth once a week. Take with a full glass of water on an empty stomach.   Yes Historical Provider, MD  amiodarone (PACERONE) 200 MG tablet Take 200 mg by mouth daily.     Yes Historical Provider, MD  amLODipine (NORVASC) 10 MG tablet Take 10 mg by mouth daily.     Yes Historical Provider, MD  apixaban (ELIQUIS) 5 MG TABS tablet Take 5 mg by mouth 2 (two) times daily.   Yes Historical Provider, MD  aspirin EC 81 MG tablet Take 324 mg by mouth once.   Yes Historical Provider, MD  baclofen (LIORESAL) 10 MG tablet Take 10 mg by mouth 3 (three) times daily.     Yes Historical Provider, MD  benazepril (LOTENSIN) 20 MG tablet Take 20 mg by mouth  at bedtime.    Yes Historical Provider, MD  calcium carbonate (OSCAL) 1500 (600 Ca) MG TABS tablet Take 1,200 mg of elemental calcium by mouth daily with breakfast.   Yes Historical Provider, MD  cholecalciferol (VITAMIN D) 1000 units tablet Take 1,000 Units by mouth daily.   Yes Historical Provider, MD  docusate sodium (COLACE) 100 MG capsule Take 100 mg by mouth daily. & as needed for constipation    Yes Historical Provider, MD  furosemide (LASIX) 80 MG tablet Take 80 mg by mouth every morning.     Yes Historical Provider, MD  Gabapentin, PHN, (GRALISE) 600 MG TABS Take 1-2 tablets by mouth at bedtime. 2 tablets at bedtime, 1 tablet 3  times daily   Yes Historical Provider, MD  levothyroxine (SYNTHROID, LEVOTHROID) 50 MCG tablet Take 50 mcg by mouth daily.     Yes Historical Provider, MD  loperamide (IMODIUM) 2 MG capsule Take 2 mg by mouth 4 (four) times daily as needed. For loose stools/diarrhea    Yes Historical Provider, MD  Magnesium Sulfate, Laxative, (EPSOM SALT PO) Take 1 Dose by mouth at bedtime.   Yes Historical Provider, MD  menthol-zinc oxide (GOLD BOND) powder Apply 1 application topically daily.   Yes Historical Provider, MD  Multiple Vitamins-Minerals (MULTIVITAMINS THER. W/MINERALS) TABS Take 1 tablet by mouth daily.     Yes Historical Provider, MD  OxyCODONE (OXYCONTIN) 10 mg T12A 12 hr tablet Take 10 mg by mouth every 4 (four) hours as needed (pain).   Yes Historical Provider, MD  polyethylene glycol (MIRALAX / GLYCOLAX) packet Take 17 g by mouth daily as needed. For constipation.    Yes Historical Provider, MD  potassium chloride SA (K-DUR,KLOR-CON) 20 MEQ tablet Take 20 mEq by mouth daily.     Yes Historical Provider, MD  tamoxifen (NOLVADEX) 10 MG tablet Take 10 mg by mouth daily.     Yes Historical Provider, MD  venlafaxine (EFFEXOR-XR) 150 MG 24 hr capsule Take 150 mg by mouth daily.     Yes Historical Provider, MD   BP 142/64 mmHg  Pulse 51  Resp 17  SpO2 96% Physical Exam  Constitutional: She appears well-developed and well-nourished. No distress.  HENT:  Head: Normocephalic and atraumatic.  Mouth/Throat: Oropharynx is clear and moist. No oropharyngeal exudate.  Eyes: Conjunctivae and EOM are normal. Pupils are equal, round, and reactive to light. Right eye exhibits no discharge. Left eye exhibits no discharge. No scleral icterus.  Neck: Normal range of motion. Neck supple.  Cardiovascular: Normal rate, regular rhythm, normal heart sounds and intact distal pulses.   No murmur heard. Pulmonary/Chest: Effort normal and breath sounds normal. No respiratory distress.  Abdominal: Soft. Bowel sounds are  normal. There is tenderness ( mild, diffuse). There is no rebound and no guarding.  Musculoskeletal: Normal range of motion.  Lymphadenopathy:    She has no cervical adenopathy.  Neurological: She is alert. Coordination normal.  Skin: Skin is warm and dry. She is not diaphoretic.  Psychiatric: She has a normal mood and affect. Her behavior is normal.    ED Course  Procedures (including critical care time) Labs Review Labs Reviewed  BASIC METABOLIC PANEL - Abnormal; Notable for the following:    Chloride 100 (*)    Glucose, Bld 102 (*)    Calcium 8.5 (*)    All other components within normal limits  BRAIN NATRIURETIC PEPTIDE - Abnormal; Notable for the following:    B Natriuretic Peptide 119.8 (*)  All other components within normal limits  D-DIMER, QUANTITATIVE (NOT AT [facility]) - Abnormal; Notable for the following:    D-Dimer, Quant 0.72 (*)    All other components within normal limits  CBC  URINALYSIS, ROUTINE W REFLEX MICROSCOPIC (NOT AT [facility])  Randolm Idol, ED    Imaging Review Dg Chest 2 View  11/02/2015  CLINICAL DATA:  Dizziness with left arm and left upper chest pain today. EXAM: CHEST  2 VIEW COMPARISON:  Radiographs 02/25/2013. FINDINGS: The heart size and mediastinal contours are stable. There is aortic atherosclerosis with a probable hiatal hernia. There is chronic central airway and interstitial thickening. No airspace disease, edema or significant pleural effusion. The bones appear unchanged. There are glenohumeral degenerative changes, worse on the right. IMPRESSION: No acute cardiopulmonary process. Stable chronic lung disease. Aortic atherosclerosis. Electronically Signed   By: Richardean Sale M.D.   On: 11/02/2015 13:23   Ct Head Wo Contrast  11/02/2015  CLINICAL DATA:  Dizziness, onset today.  History of hypertension. EXAM: CT HEAD WITHOUT CONTRAST TECHNIQUE: Contiguous axial images were obtained from the base of the skull through the vertex without intravenous  contrast. COMPARISON:  11/01/2013 FINDINGS: The brainstem, cerebellum, cerebral peduncles, thalami, basal ganglia, basilar cisterns, and ventricular system appear within normal limits. Periventricular white matter and corona radiata hypodensities favor chronic ischemic microvascular white matter disease. No intracranial hemorrhage, mass lesion, or acute CVA. IMPRESSION: 1. No specific abnormality to account for the patient's new dizziness. 2. Periventricular white matter and corona radiata hypodensities favor chronic ischemic microvascular white matter disease. Electronically Signed   By: Van Clines M.D.   On: 11/02/2015 15:05   I have personally reviewed and evaluated these images and lab results as part of my medical decision-making.   EKG Interpretation None      MDM   Final diagnoses:  Dizziness   Patient is afebrile and non-toxic appearing. Her vital signs are stable on initial presentation. Neurologic exam re-assuring. U/A re-assuring doubt UTI as culprit. BMP re-assuring. CBC re-assuring, no anemia. Troponin negative. CXR negative for acute cardiopulmonary process. CT head negative for intracranial hemorrhage, mass lesion, or acute CVA. Borderline orthostatic from laying to sitting. Discussed patient with Dr. Roderic Palau, who also evaluated patient. Unclear cause of dizziness - ?dehydration vs ?polypharmacy vs. ?depression vs.   In preparation for d/c patient, noted patient was hypoxic ~90% on RA. On ambulation, O2 sats dropped to 83%. Patient placed on 2L [place]. BNP mildly bumped at 119. D-dimer mildly elevated, CT negative for PE. Patient is not on home oxygen. Will consult hospitalists for admission for new hypoxia.   8:22 PM: Dr. Myna Hidalgo or [facility] consulted. Appreciated his input. Will see patient.   9:43 PM: will admit patient to observation and have home health establish home O2 therapy in am.   Roxanna Mew, PA-C 11/02/15 [address] [place], PA-C 11/02/15  2144  Milton Ferguson, MD 11/03/15 [phone]

## 2015-11-02 NOTE — ED Notes (Signed)
Assisted PA with walking Pt

## 2015-11-03 DIAGNOSIS — E785 Hyperlipidemia, unspecified: Secondary | ICD-10-CM | POA: Diagnosis not present

## 2015-11-03 DIAGNOSIS — I480 Paroxysmal atrial fibrillation: Secondary | ICD-10-CM

## 2015-11-03 DIAGNOSIS — R0602 Shortness of breath: Secondary | ICD-10-CM | POA: Diagnosis not present

## 2015-11-03 DIAGNOSIS — I10 Essential (primary) hypertension: Secondary | ICD-10-CM

## 2015-11-03 DIAGNOSIS — R1084 Generalized abdominal pain: Secondary | ICD-10-CM | POA: Diagnosis not present

## 2015-11-03 DIAGNOSIS — Z7901 Long term (current) use of anticoagulants: Secondary | ICD-10-CM | POA: Diagnosis not present

## 2015-11-03 DIAGNOSIS — Z7982 Long term (current) use of aspirin: Secondary | ICD-10-CM | POA: Diagnosis not present

## 2015-11-03 DIAGNOSIS — F329 Major depressive disorder, single episode, unspecified: Secondary | ICD-10-CM | POA: Diagnosis not present

## 2015-11-03 DIAGNOSIS — E038 Other specified hypothyroidism: Secondary | ICD-10-CM

## 2015-11-03 DIAGNOSIS — J9600 Acute respiratory failure, unspecified whether with hypoxia or hypercapnia: Secondary | ICD-10-CM | POA: Diagnosis not present

## 2015-11-03 DIAGNOSIS — G8929 Other chronic pain: Secondary | ICD-10-CM | POA: Diagnosis not present

## 2015-11-03 DIAGNOSIS — R42 Dizziness and giddiness: Secondary | ICD-10-CM | POA: Diagnosis not present

## 2015-11-03 DIAGNOSIS — R0902 Hypoxemia: Secondary | ICD-10-CM | POA: Diagnosis not present

## 2015-11-03 LAB — BASIC METABOLIC PANEL
Anion gap: 6 (ref 5–15)
BUN: 10 mg/dL (ref 6–20)
CALCIUM: 8.2 mg/dL — AB (ref 8.9–10.3)
CO2: 31 mmol/L (ref 22–32)
Chloride: 102 mmol/L (ref 101–111)
Creatinine, Ser: 0.55 mg/dL (ref 0.44–1.00)
GFR calc Af Amer: >60 mL/min (ref >60)
GLUCOSE: 97 mg/dL (ref 65–99)
Potassium: 3.4 mmol/L — ABNORMAL LOW (ref 3.5–5.1)
Sodium: 139 mmol/L (ref 135–145)

## 2015-11-03 LAB — TROPONIN I
Troponin I: <0.03 ng/mL (ref <0.031)
Troponin I: <0.03 ng/mL (ref <0.031)

## 2015-11-03 LAB — MRSA PCR SCREENING: MRSA BY PCR: NEGATIVE

## 2015-11-03 LAB — GLUCOSE, CAPILLARY: Glucose-Capillary: 102 mg/dL — ABNORMAL HIGH (ref 65–99)

## 2015-11-03 MED ORDER — POTASSIUM CHLORIDE CRYS ER 20 MEQ PO TBCR
40.0000 meq | EXTENDED_RELEASE_TABLET | Freq: Once | ORAL | Status: AC
  Administered 2015-11-03: 40 meq via ORAL
  Filled 2015-11-03: qty 2

## 2015-11-03 NOTE — Care Management Obs Status (Signed)
[place] NOTIFICATION   Patient Details  Name: [name] MRN: [id number] Date of Birth: 11-Jul-1930   Medicare Observation Status Notification Given:  Yes    Exzavier Ruderman, Rory Percy, RN 11/03/2015, 11:49 AM

## 2015-11-03 NOTE — Discharge Summary (Addendum)
Physician Discharge Summary  [name] [id number] DOB: April 06, 1931 DOA: 11/02/2015  PCP: [name], MD  Admit date: 11/02/2015 Discharge date: 11/03/2015  Recommendations for Outpatient Follow-up:  1. Pt will need to follow up with PCP in 1 week post discharge 2. Please obtain BMP to evaluate electrolytes and kidney function 3. Please note that narcotic medication was held as pt was hypoxic and dizzy 4. Please reassess if the narcotic medication can be resumed based on clinical status and consider tylenol vs tramadol for pain control 5. Also please note that pt insisted on being discharged today and has declined further imaging studies and any interventions, she is very clear she is DNR and comfort is her priority, she is alert and oriented and aware of her chronic conditions   Discharge Diagnoses:  Principal Problem:   Respiratory failure with hypoxia ([place]) Active Problems:   Depression  Discharge Condition: Stable  Diet recommendation: Heart healthy diet discussed in details   History of present illness:  82 y.o. female with medical history significant for paroxysmal atrial fibrillation on Eliquis, depression, anxiety, hypothyroidism, chronic venous stasis, and hypertension who presents to the ED from her assisted living facility with 1 day of lightheadedness. Patient reports she was in her usual state of health until the early morning hours of 11/02/2015 when she noted fairly acute onset of lightheadedness while at rest. This worsened upon standing, persisted throughout the day of her presentation and was associated with some pain in the left arm and upper chest.   ED Course: Upon arrival to the ED, patient is found to be bradycardic to the 50s, saturating as low as 83% on room air, intermittently tachypneic, and with stable blood pressure. EKG features a sinus rhythm with low voltage QRS and minimal/borderline ST elevation in inferior leads which is not significantly  changed from prior EKGs. Troponin was 0.00 and BNP 119.8. Chest x-ray was negative for acute cardiopulmonary disease. BMP and CBC are unremarkable, as is urinalysis. Head CT was obtained and negative for acute intracranial abnormality. Patient had persistent hypoxia and was placed on supplemental oxygen at a rate of 2 L/m via nasal cannula. D-dimer returned elevated to a value of 0.72 and the patient was sent for CTA PE study. CTA is negative for pulmonary embolism, pulmonary edema, or infiltrate.   Hospital Course:   Assessment/Plan  1. Respiratory failure with hypoxia  - initial oxygen saturation in 80's with RR in 30's, now resolved  - Uncertain etiology, may be chronic?, secondary to atelectasis?, narcotics - CXR and CTA PE study notable for atelectasis only  - trying to taper off oxygen as pt was clear she does not want it  - stopped narcotic medication to se if that will help - pt made aware she may need oxygen upon discharge if unable to taper off  2. Atrial fibrillation, paroxysmal  - In sinus rhythm at time of admission  - CHADS-VASc at least 3 (age x2, gender)  - Continue current-management with Eliquis and amiodarone  3. Lightheadedness  - unclear if related to orthostatic hypotension vs acute CVA - pt declines further imaging studies, says she is DNR and wants to be comfortable, wants to go home - she also refused checking orthostatic vitals  - holding narcotic medications for now  - PT eval requested  4. Hypertension, essential  - At goal at time of admission  - Continue current management with benazepril, Norvasc, and Lasix   5. Hypothyroidism  - Appears to be stable  -  Continue current-dose Synthroid   6. Hx of breast cancer  - Continue Tamoxifen   DVT prophylaxis: Eliquis  Code Status: DNR Family Communication: Discussed with patient, she has declined my offer to call family   Procedures/Studies: Dg Chest 2 View  11/02/2015  CLINICAL DATA:   Dizziness with left arm and left upper chest pain today. EXAM: CHEST  2 VIEW COMPARISON:  Radiographs 02/25/2013. FINDINGS: The heart size and mediastinal contours are stable. There is aortic atherosclerosis with a probable hiatal hernia. There is chronic central airway and interstitial thickening. No airspace disease, edema or significant pleural effusion. The bones appear unchanged. There are glenohumeral degenerative changes, worse on the right. IMPRESSION: No acute cardiopulmonary process. Stable chronic lung disease. Aortic atherosclerosis. Electronically Signed   By: Richardean Sale M.D.   On: 11/02/2015 13:23   Ct Head Wo Contrast  11/02/2015  CLINICAL DATA:  Dizziness, onset today.  History of hypertension. EXAM: CT HEAD WITHOUT CONTRAST TECHNIQUE: Contiguous axial images were obtained from the base of the skull through the vertex without intravenous contrast. COMPARISON:  11/01/2013 FINDINGS: The brainstem, cerebellum, cerebral peduncles, thalami, basal ganglia, basilar cisterns, and ventricular system appear within normal limits. Periventricular white matter and corona radiata hypodensities favor chronic ischemic microvascular white matter disease. No intracranial hemorrhage, mass lesion, or acute CVA. IMPRESSION: 1. No specific abnormality to account for the patient's new dizziness. 2. Periventricular white matter and corona radiata hypodensities favor chronic ischemic microvascular white matter disease. Electronically Signed   By: Van Clines M.D.   On: 11/02/2015 15:05   Ct Angio Chest Pe W/cm &/or Wo Cm  11/02/2015  CLINICAL DATA:  Pt reports to the ED from [facility] assisted living for eval of dizziness and left arm and left upper chest pain. Symptom onset 12 hrs ago. Denies any dizziness upon standing. Denies any SOB or active vomiting. She reports some nausea but she is always nauseated. No orthostatic changes noted en route. 12 lead showed atrial fibrillation rate controlled. History  of breast carcinoma and hypertension. EXAM: CT ANGIOGRAPHY CHEST WITH CONTRAST TECHNIQUE: Multidetector CT imaging of the chest was performed using the standard protocol during bolus administration of intravenous contrast. Multiplanar CT image reconstructions and MIPs were obtained to evaluate the vascular anatomy. CONTRAST:  60 mL of Isovue 370 intravenous contrast COMPARISON:  Current chest radiograph FINDINGS: Angiographic study: There is no evidence of a pulmonary embolism. Minor atherosclerotic calcification along the aortic arch. No aortic dissection. Neck base and axilla:  No mass or adenopathy. Mediastinum and hila: Mild cardiomegaly. No significant coronary artery calcifications. No mediastinal or hilar masses or enlarged lymph nodes. Small to moderate hiatal hernia. Lungs and pleura: No evidence of pneumonia or pulmonary edema. Prior peripheral reticular opacities consistent with mild peripheral parenchymal scarring. Focal opacity is noted in the right middle lobe adjacent to the right heart border consistent with atelectasis. There is mild subsegmental atelectasis in the lung bases. No lung mass or suspicious nodule. No pleural effusion or pneumothorax. Limited upper abdomen:  No acute findings. Musculoskeletal: No osteoblastic or osteolytic lesions. Scoliosis of the lower thoracic and lumbar spine. Review of the MIP images confirms the above findings. IMPRESSION: 1. No evidence of a pulmonary embolus. 2. No acute findings. 3. Mild cardiomegaly.  Small hiatal hernia. Electronically Signed   By: Lajean Manes M.D.   On: 11/02/2015 19:50    Discharge Exam: Filed Vitals:   11/03/15 0536 11/03/15 0747  BP: 144/58 123/54  Pulse: 53 51  Temp: 98.5  F (36.9 C) 98.6 F (37 C)  Resp: 18 17   Filed Vitals:   11/03/15 0536 11/03/15 0747 11/03/15 0858 11/03/15 0957  BP: 144/58 123/54    Pulse: 53 51    Temp: 98.5 F (36.9 C) 98.6 F (37 C)    TempSrc: Oral Oral    Resp: 18 17    Height:       Weight:      SpO2: 96% 95% 95% 93%    General: Pt is alert, follows commands appropriately, not in acute distress Cardiovascular: Regular rate and rhythm, no rubs, no gallops Respiratory: Clear to auscultation bilaterally, no wheezing, diminished breath sounds at bases  Abdominal: Soft, non tender, non distended, bowel sounds +, no guarding Extremities: no edema, no cyanosis, pulses palpable bilaterally DP and PT  Discharge Instructions  Discharge Instructions    Diet - low sodium heart healthy    Complete by:  As directed      Increase activity slowly    Complete by:  As directed             Medication List    STOP taking these medications        oxyCODONE 10 mg 12 hr tablet  Commonly known as:  OXYCONTIN      TAKE these medications        acetaminophen 325 MG tablet  Commonly known as:  TYLENOL  Take 325 mg by mouth every 4 (four) hours as needed. For pain/headache/fever     alendronate 70 MG tablet  Commonly known as:  FOSAMAX  Take 70 mg by mouth once a week. Take with a full glass of water on an empty stomach.     amiodarone 200 MG tablet  Commonly known as:  PACERONE  Take 200 mg by mouth daily.     amLODipine 10 MG tablet  Commonly known as:  NORVASC  Take 10 mg by mouth daily.     aspirin EC 81 MG tablet  Take 324 mg by mouth once.     baclofen 10 MG tablet  Commonly known as:  LIORESAL  Take 10 mg by mouth 3 (three) times daily.     benazepril 20 MG tablet  Commonly known as:  LOTENSIN  Take 20 mg by mouth at bedtime.     calcium carbonate 1500 (600 Ca) MG Tabs tablet  Commonly known as:  OSCAL  Take 1,200 mg of elemental calcium by mouth daily with breakfast.     cholecalciferol 1000 units tablet  Commonly known as:  VITAMIN D  Take 1,000 Units by mouth daily.     docusate sodium 100 MG capsule  Commonly known as:  COLACE  Take 100 mg by mouth daily. & as needed for constipation     ELIQUIS 5 MG Tabs tablet  Generic drug:  apixaban   Take 5 mg by mouth 2 (two) times daily.     EPSOM SALT PO  Take 1 Dose by mouth at bedtime.     furosemide 80 MG tablet  Commonly known as:  LASIX  Take 80 mg by mouth every morning.     GRALISE 600 MG Tabs  Generic drug:  Gabapentin (Once-Daily)  Take 1-2 tablets by mouth at bedtime. 2 tablets at bedtime, 1 tablet 3 times daily     levothyroxine 50 MCG tablet  Commonly known as:  SYNTHROID, LEVOTHROID  Take 50 mcg by mouth daily.     loperamide 2 MG capsule  Commonly known as:  IMODIUM  Take 2 mg by mouth 4 (four) times daily as needed. For loose stools/diarrhea     menthol-zinc oxide powder  Apply 1 application topically daily.     multivitamins ther. w/minerals Tabs tablet  Take 1 tablet by mouth daily.     polyethylene glycol packet  Commonly known as:  MIRALAX / GLYCOLAX  Take 17 g by mouth daily as needed. For constipation.     potassium chloride SA 20 MEQ tablet  Commonly known as:  K-DUR,KLOR-CON  Take 20 mEq by mouth daily.     tamoxifen 10 MG tablet  Commonly known as:  NOLVADEX  Take 10 mg by mouth daily.     venlafaxine XR 150 MG 24 hr capsule  Commonly known as:  EFFEXOR-XR  Take 150 mg by mouth daily.            Follow-up Information    Follow up with [facility].   Specialty:  Emergency Medicine   Why:  If symptoms worsen   Contact information:   [address] [id number] [place] [phone]      Call [name], MD.   Specialty:  Family Medicine   Why:  To schedule a follow up appointment for re-evaluation within one week   Contact information:   [address] [place] [address] [phone]        The results of significant diagnostics from this hospitalization (including imaging, microbiology, ancillary and laboratory) are listed below for reference.     Microbiology: Recent Results (from the past 240 hour(s))  MRSA PCR  Screening     Status: None   Collection Time: 11/02/15 11:27 PM  Result Value Ref Range Status   MRSA by PCR NEGATIVE NEGATIVE Final    Comment:        The GeneXpert MRSA Assay (FDA approved for NASAL specimens only), is one component of a comprehensive MRSA colonization surveillance program. It is not intended to diagnose MRSA infection nor to guide or monitor treatment for MRSA infections.      Labs: Basic Metabolic Panel:  Recent Labs Lab 11/02/15 1245 11/03/15 0415  NA 136 139  K 4.2 3.4*  CL 100* 102  CO2 27 31  GLUCOSE 102* 97  BUN 18 10  CREATININE 0.66 0.55  CALCIUM 8.5* 8.2*   CBC:  Recent Labs Lab 11/02/15 1245  WBC 7.0  HGB 12.7  HCT 41.0  MCV 85.6  PLT 204   Cardiac Enzymes:  Recent Labs Lab 11/02/15 2222 11/03/15 0420 11/03/15 0949  TROPONINI <0.03 <0.03 <0.03   BNP: BNP (last 3 results)  Recent Labs  11/02/15 1245  BNP 119.8*   CBG:  Recent Labs Lab 11/03/15 0746  GLUCAP 102*   SIGNED: Time coordinating discharge: 30 minutes  Faye Ramsay, MD  [facility] 11/03/2015, 11:14 AM Pager [phone]  If 7PM-7AM, please contact night-coverage [email or web address] Password [email or web address]

## 2015-11-03 NOTE — Clinical Social Work Note (Signed)
[name] is medically stable for discharge back to [facility] today. Discharge clinicals transmitted to facility and approved. Patient willl be transported back to ALF via ambulance.  Call made to [facility] [phone]) and message left regarding patient's discharge (patient's name not disclosed).  Kagen Kunath Givens, MSW, LCSW Licensed Clinical Social Worker [place] [phone]

## 2015-11-03 NOTE — NC FL2 (Signed)
[place] MEDICAID FL2 LEVEL OF CARE SCREENING TOOL     IDENTIFICATION  Patient Name: [name] Birthdate: 05-27-1930 Sex: female Admission Date (Current Location): 11/02/2015  [facility] and [place] Number:  Herbalist and Address:  [facility], [place] [address] [place], [place] [address]      Provider Number: [id number]  Attending Physician Name and Address:  Theodis Blaze, MD  Relative Name and Phone Number:  Kaycen Mikita - son. Phone [phone].    Current Level of Care: Hospital Recommended Level of Care: [place] ([place]) Prior Approval Number:    Date Approved/Denied:   PASRR Number:    Discharge Plan: Other (Comment) ([place])    Current Diagnoses: Patient Active Problem List   Diagnosis Date Noted  . Respiratory failure with hypoxia ([employer]) 11/02/2015  . Depression 11/02/2015  . Anxiety 11/02/2015  . Hypertension 11/02/2015  . Paroxysmal atrial fibrillation ([place]) 11/02/2015  . Hypothyroidism 11/02/2015  . Chronic venous stasis dermatitis 11/02/2015  . Chronic pain 11/02/2015  . Breast cancer ([place]) 11/02/2015    Orientation RESPIRATION BLADDER Height & Weight     Self, Time, Situation, Place  Normal Continent Weight: 161 lb (73.029 kg) Height:  5' (152.4 cm)  BEHAVIORAL SYMPTOMS/MOOD NEUROLOGICAL BOWEL NUTRITION STATUS      Continent Diet (Low sodium - Heart healthy)  AMBULATORY STATUS COMMUNICATION OF NEEDS Skin   Independent Verbally Other (Comment) (Discloration to bilateral lower expremities)                       Personal Care Assistance Level of Assistance  Bathing, Feeding, Dressing Bathing Assistance: Limited assistance Feeding assistance: Independent Dressing Assistance: Independent     Functional Limitations Info  Sight, Hearing, Speech Sight Info: Adequate Hearing Info: Adequate Speech Info: Adequate    SPECIAL CARE FACTORS FREQUENCY                        Contractures Contractures Info: Not present    Additional Factors Info  Code Status, Allergies Code Status Info: DNR Allergies Info: Sulfa antibiotics           Current Medications (11/03/2015):  This is the current hospital active medication list Current Facility-Administered Medications  Medication Dose Route Frequency Provider Last Rate Last Dose  . 0.9 %  sodium chloride infusion  250 mL Intravenous PRN Vianne Bulls, MD      . acetaminophen (TYLENOL) tablet 650 mg  650 mg Oral Q6H PRN Vianne Bulls, MD      . amiodarone (PACERONE) tablet 200 mg  200 mg Oral Daily Vianne Bulls, MD   200 mg at 11/03/15 1019  . amLODipine (NORVASC) tablet 10 mg  10 mg Oral Daily Vianne Bulls, MD   10 mg at 11/03/15 1020  . antiseptic oral rinse (CPC / CETYLPYRIDINIUM CHLORIDE 0.05%) solution 7 mL  7 mL Mouth Rinse BID Vianne Bulls, MD      . apixaban (ELIQUIS) tablet 5 mg  5 mg Oral BID Vianne Bulls, MD   5 mg at 11/03/15 1020  . aspirin EC tablet 81 mg  81 mg Oral Daily Vianne Bulls, MD   81 mg at 11/03/15 1020  . baclofen (LIORESAL) tablet 10 mg  10 mg Oral TID Vianne Bulls, MD   10 mg at 11/03/15 1021  . benazepril (LOTENSIN) tablet 20 mg  20  mg Oral QHS Vianne Bulls, MD   20 mg at 11/03/15 0036  . bisacodyl (DULCOLAX) EC tablet 5 mg  5 mg Oral Daily PRN Vianne Bulls, MD      . calcium carbonate (OS-CAL - dosed in mg of elemental calcium) tablet 1,000 mg of elemental calcium  2 tablet Oral Q breakfast Vianne Bulls, MD   1,000 mg of elemental calcium at 11/03/15 0812  . cholecalciferol (VITAMIN D) tablet 1,000 Units  1,000 Units Oral Daily Vianne Bulls, MD   1,000 Units at 11/03/15 1020  . furosemide (LASIX) tablet 80 mg  80 mg Oral BH-q7a Ilene Qua Opyd, MD   80 mg at 11/03/15 0546  . gabapentin (NEURONTIN) capsule 600 mg  600 mg Oral Q8H Vianne Bulls, MD   600 mg at 11/03/15 0546  . levothyroxine (SYNTHROID, LEVOTHROID) tablet 50 mcg  50 mcg Oral  QAC breakfast Vianne Bulls, MD   50 mcg at 11/03/15 0810  . multivitamin with minerals tablet 1 tablet  1 tablet Oral Daily Vianne Bulls, MD   1 tablet at 11/03/15 1021  . ondansetron (ZOFRAN) tablet 4 mg  4 mg Oral Q6H PRN Vianne Bulls, MD       Or  . ondansetron (ZOFRAN) injection 4 mg  4 mg Intravenous Q6H PRN Vianne Bulls, MD      . oxyCODONE (OXYCONTIN) 12 hr tablet 10 mg  10 mg Oral Q12H Vianne Bulls, MD   10 mg at 11/03/15 1020  . polyethylene glycol (MIRALAX / GLYCOLAX) packet 17 g  17 g Oral Daily PRN Ilene Qua Opyd, MD      . sodium chloride flush (NS) 0.9 % injection 3 mL  3 mL Intravenous Q12H Ilene Qua Opyd, MD   3 mL at 11/03/15 1022  . sodium chloride flush (NS) 0.9 % injection 3 mL  3 mL Intravenous PRN Vianne Bulls, MD      . tamoxifen (NOLVADEX) tablet 10 mg  10 mg Oral Daily Vianne Bulls, MD   10 mg at 11/03/15 1019  . venlafaxine XR (EFFEXOR-XR) 24 hr capsule 150 mg  150 mg Oral Daily Vianne Bulls, MD   150 mg at 11/03/15 1018     Discharge Medications: Please see discharge summary for a list of discharge medications.  Relevant Imaging Results:  Relevant Lab Results:   Additional Information [id number].  DISCHARGE MEDICATION: Stop taking oxyCodone 10 mg 12 hr tablet.  TAKE THESE MEDS: acetaminophen 325 MG table-take every 4 hrs prn; alendronate 70 MG tablet-take 70 mg once a week. Take with a full glass of wate on an empty stomach; amiodarone 200 MG tablet-take 200 mg daily; amLODipone 10 MG tablet-take 10 mg daily; aspirin EC 81 MG tablet-take 324 mg once; baclofen 10 MG tablet-take 10 mg 3 times daily; benazepril 20 MG tablet-take 20 mg by mouth at bedtime; calcium carbonate 1500 (600 Ca) MG Tabs tablet-take 1,200 mg of elemental calcium daily with breakfast; cholecalciferol 1000 un9its tablet-take 1,000 Units daily; docusage sodium 100 MG capsule-take 100 mg daily & prn for constipation; Eliquis 5 MG Tabs tablet-take 5 mg 2 times daily; EPSON SALT  PO-take 1 dose at bedtime; furosemide 80 MH tablet-take 80 mg every morning; GRALISE 600 MG Tabs-take 1-2 tablet at bedtime, 2 tablets at bedtime, 1 tablet 3 times daily; levothyroxine 50 MCG talbet-take 50 mcg daily; loperamide 2 MG capsule-take 2 mg 4 times daily prn, for loose stools/diarrhea; menthol-zinc  oxide powder-apply 1 application topically daily; nultivitamins ther. w/minerals Tabs tablet-take 1 tablet daily; polyethylene glycol packet-take 17 g daily prn for contstipation; potassium cholride SA 20 MEQ tablet-take 20 mEq daily; tamoxifen 10 MG tablet-take 10 mg daily; venlafaxine XR 150 MG 24 hr capsule-take 150 mg daily.   Sable Feil, LCSW

## 2015-11-03 NOTE — Care Management Note (Signed)
Case Management Note  Patient Details  Name: [name] MRN: [id number] Date of Birth: [birth year]  Subjective/Objective:        CM following for progression and d/c planning.             Action/Plan: 11/03/2015 Met with pt who plans to d/c back to ALF, [facility], pt states that she is ready to go and would like for the MD to discharge her right away. No HH or DME needs identified.  Pt able to wean from oxygen.   Expected Discharge Date:  11/03/2015              Expected Discharge Plan:  Assisted Living / Rest Home  In-House Referral:  Clinical Social Work  Discharge planning Services  NA  Post Acute Care Choice:  NA Choice offered to:  NA  DME Arranged:   NA DME Agency:   NA  HH Arranged:   NA HH Agency:   NA  Status of Service:  Completed, signed off  If discussed at [employer] of Stay Meetings, dates discussed:    Additional Comments:  Adron Bene, RN 11/03/2015, 11:50 AM

## 2015-11-03 NOTE — Clinical Social Work Note (Signed)
Clinical Social Work Assessment  Patient Details  Name: [name] MRN: [id number] Date of Birth: Nov 04, 1930  Date of referral:  11/03/15               Reason for consult:  Facility Placement                Permission sought to share information with:  Family Supports Permission granted to share information::  Yes, Verbal Permission Granted  Name::     [name], community and [name]  Agency::     Relationship::  Son and cousin  Sport and exercise psychologist Information:  Son - [phone] and cousin [phone]  Housing/Transportation Living arrangements for the past 2 months:  [place] ([place] [phone]) Source of Information:  Patient, Other (Comment Required) (Shadow chart which contained ALF information) Patient Interpreter Needed:  None Criminal Activity/Legal Involvement Pertinent to Current Situation/Hospitalization:  No - Comment as needed Significant Relationships:  Adult Children, Other Family Members (Son and cousin) Lives with:  Facility Resident ([facility]) Do you feel safe going back to the place where you live?  Yes Need for family participation in patient care:  Yes (Comment)  Care giving concerns:  None expressed by patient.   Social Worker assessment / plan:  [name] is from [facility] and has been a resident there since 12/23/09. CSW visited with patient in the room and she was lying in bed, awake alert and oriented. [name] confirmed that she will return to [facility] and requested that her son and cousin be informed.  Employment status:  Retired Forensic scientist:  Medicare PT Recommendations:  Not assessed at this time [place] / Referral to community resources:  Other (Comment Required) (None needed or requested at this time as patient from ALF )  Patient/Family's Response to care:  No concerns expressed regarding care during hospitalization.  Patient/Family's Understanding of and  Emotional Response to Diagnosis, Current Treatment, and Prognosis:  Not discussed.  Emotional Assessment Appearance:  Appears stated age Attitude/Demeanor/Rapport:  Other (Appropriate) Affect (typically observed):  Appropriate Orientation:  Oriented to Self, Oriented to Place, Oriented to  Time, Oriented to Situation Alcohol / Substance use:  Never Used Psych involvement (Current and /or in the community):  No (Comment)  Discharge Needs  Concerns to be addressed:  Discharge Planning Concerns Readmission within the last 30 days:  No Current discharge risk:  None Barriers to Discharge:  No Barriers Identified   Karen Feil, LCSW 11/03/2015, 2:41 PM

## 2015-11-03 NOTE — Progress Notes (Signed)
Report called to [facility].  All questions answered.

## 2015-11-07 DIAGNOSIS — F339 Major depressive disorder, recurrent, unspecified: Secondary | ICD-10-CM | POA: Diagnosis not present

## 2015-11-14 DIAGNOSIS — F339 Major depressive disorder, recurrent, unspecified: Secondary | ICD-10-CM | POA: Diagnosis not present

## 2015-11-28 DIAGNOSIS — F339 Major depressive disorder, recurrent, unspecified: Secondary | ICD-10-CM | POA: Diagnosis not present

## 2015-12-05 DIAGNOSIS — F339 Major depressive disorder, recurrent, unspecified: Secondary | ICD-10-CM | POA: Diagnosis not present

## 2015-12-06 DIAGNOSIS — F339 Major depressive disorder, recurrent, unspecified: Secondary | ICD-10-CM | POA: Diagnosis not present

## 2015-12-08 DIAGNOSIS — I739 Peripheral vascular disease, unspecified: Secondary | ICD-10-CM | POA: Diagnosis not present

## 2015-12-08 DIAGNOSIS — L84 Corns and callosities: Secondary | ICD-10-CM | POA: Diagnosis not present

## 2015-12-08 DIAGNOSIS — L603 Nail dystrophy: Secondary | ICD-10-CM | POA: Diagnosis not present

## 2015-12-12 DIAGNOSIS — F339 Major depressive disorder, recurrent, unspecified: Secondary | ICD-10-CM | POA: Diagnosis not present

## 2015-12-13 DIAGNOSIS — M47812 Spondylosis without myelopathy or radiculopathy, cervical region: Secondary | ICD-10-CM | POA: Diagnosis not present

## 2015-12-13 DIAGNOSIS — M461 Sacroiliitis, not elsewhere classified: Secondary | ICD-10-CM | POA: Diagnosis not present

## 2015-12-13 DIAGNOSIS — M5416 Radiculopathy, lumbar region: Secondary | ICD-10-CM | POA: Diagnosis not present

## 2015-12-13 DIAGNOSIS — G894 Chronic pain syndrome: Secondary | ICD-10-CM | POA: Diagnosis not present

## 2015-12-19 DIAGNOSIS — F339 Major depressive disorder, recurrent, unspecified: Secondary | ICD-10-CM | POA: Diagnosis not present

## 2015-12-22 DIAGNOSIS — I119 Hypertensive heart disease without heart failure: Secondary | ICD-10-CM | POA: Diagnosis not present

## 2015-12-22 DIAGNOSIS — E782 Mixed hyperlipidemia: Secondary | ICD-10-CM | POA: Diagnosis not present

## 2015-12-22 DIAGNOSIS — F322 Major depressive disorder, single episode, severe without psychotic features: Secondary | ICD-10-CM | POA: Diagnosis not present

## 2015-12-22 DIAGNOSIS — C50919 Malignant neoplasm of unspecified site of unspecified female breast: Secondary | ICD-10-CM | POA: Diagnosis not present

## 2015-12-22 DIAGNOSIS — K219 Gastro-esophageal reflux disease without esophagitis: Secondary | ICD-10-CM | POA: Diagnosis not present

## 2015-12-22 DIAGNOSIS — M545 Low back pain: Secondary | ICD-10-CM | POA: Diagnosis not present

## 2015-12-22 DIAGNOSIS — I4891 Unspecified atrial fibrillation: Secondary | ICD-10-CM | POA: Diagnosis not present

## 2015-12-22 DIAGNOSIS — M1990 Unspecified osteoarthritis, unspecified site: Secondary | ICD-10-CM | POA: Diagnosis not present

## 2015-12-22 DIAGNOSIS — Z0000 Encounter for general adult medical examination without abnormal findings: Secondary | ICD-10-CM | POA: Diagnosis not present

## 2015-12-22 DIAGNOSIS — E039 Hypothyroidism, unspecified: Secondary | ICD-10-CM | POA: Diagnosis not present

## 2015-12-22 DIAGNOSIS — D649 Anemia, unspecified: Secondary | ICD-10-CM | POA: Diagnosis not present

## 2015-12-27 DIAGNOSIS — F339 Major depressive disorder, recurrent, unspecified: Secondary | ICD-10-CM | POA: Diagnosis not present

## 2016-01-09 DIAGNOSIS — F339 Major depressive disorder, recurrent, unspecified: Secondary | ICD-10-CM | POA: Diagnosis not present

## 2016-01-10 DIAGNOSIS — M5416 Radiculopathy, lumbar region: Secondary | ICD-10-CM | POA: Diagnosis not present

## 2016-01-10 DIAGNOSIS — G894 Chronic pain syndrome: Secondary | ICD-10-CM | POA: Diagnosis not present

## 2016-01-10 DIAGNOSIS — M47812 Spondylosis without myelopathy or radiculopathy, cervical region: Secondary | ICD-10-CM | POA: Diagnosis not present

## 2016-01-10 DIAGNOSIS — M461 Sacroiliitis, not elsewhere classified: Secondary | ICD-10-CM | POA: Diagnosis not present

## 2016-02-07 DIAGNOSIS — G894 Chronic pain syndrome: Secondary | ICD-10-CM | POA: Diagnosis not present

## 2016-02-07 DIAGNOSIS — M47812 Spondylosis without myelopathy or radiculopathy, cervical region: Secondary | ICD-10-CM | POA: Diagnosis not present

## 2016-02-07 DIAGNOSIS — M5416 Radiculopathy, lumbar region: Secondary | ICD-10-CM | POA: Diagnosis not present

## 2016-02-07 DIAGNOSIS — M461 Sacroiliitis, not elsewhere classified: Secondary | ICD-10-CM | POA: Diagnosis not present

## 2016-02-08 ENCOUNTER — Other Ambulatory Visit: Payer: Self-pay | Admitting: Obstetrics and Gynecology

## 2016-02-08 DIAGNOSIS — Z1231 Encounter for screening mammogram for malignant neoplasm of breast: Secondary | ICD-10-CM

## 2016-02-16 DIAGNOSIS — F339 Major depressive disorder, recurrent, unspecified: Secondary | ICD-10-CM | POA: Diagnosis not present

## 2016-02-22 ENCOUNTER — Ambulatory Visit
Admission: RE | Admit: 2016-02-22 | Discharge: 2016-02-22 | Disposition: A | Discharge status: Home / Self Care | Payer: Medicare Other | Source: Ambulatory Visit | Attending: Obstetrics and Gynecology | Admitting: Obstetrics and Gynecology

## 2016-02-22 DIAGNOSIS — Z1231 Encounter for screening mammogram for malignant neoplasm of breast: Secondary | ICD-10-CM

## 2016-03-05 DIAGNOSIS — F339 Major depressive disorder, recurrent, unspecified: Secondary | ICD-10-CM | POA: Diagnosis not present

## 2016-03-06 DIAGNOSIS — M19012 Primary osteoarthritis, left shoulder: Secondary | ICD-10-CM | POA: Diagnosis not present

## 2016-03-06 DIAGNOSIS — M5416 Radiculopathy, lumbar region: Secondary | ICD-10-CM | POA: Diagnosis not present

## 2016-03-06 DIAGNOSIS — Z79891 Long term (current) use of opiate analgesic: Secondary | ICD-10-CM | POA: Diagnosis not present

## 2016-03-06 DIAGNOSIS — G894 Chronic pain syndrome: Secondary | ICD-10-CM | POA: Diagnosis not present

## 2016-03-06 DIAGNOSIS — M47812 Spondylosis without myelopathy or radiculopathy, cervical region: Secondary | ICD-10-CM | POA: Diagnosis not present

## 2016-03-06 DIAGNOSIS — M62830 Muscle spasm of back: Secondary | ICD-10-CM | POA: Diagnosis not present

## 2016-03-06 DIAGNOSIS — M7552 Bursitis of left shoulder: Secondary | ICD-10-CM | POA: Diagnosis not present

## 2016-03-06 DIAGNOSIS — M461 Sacroiliitis, not elsewhere classified: Secondary | ICD-10-CM | POA: Diagnosis not present

## 2016-03-06 DIAGNOSIS — M25552 Pain in left hip: Secondary | ICD-10-CM | POA: Diagnosis not present

## 2016-03-06 DIAGNOSIS — M47817 Spondylosis without myelopathy or radiculopathy, lumbosacral region: Secondary | ICD-10-CM | POA: Diagnosis not present

## 2016-03-12 DIAGNOSIS — F339 Major depressive disorder, recurrent, unspecified: Secondary | ICD-10-CM | POA: Diagnosis not present

## 2016-03-14 DIAGNOSIS — M8588 Other specified disorders of bone density and structure, other site: Secondary | ICD-10-CM | POA: Diagnosis not present

## 2016-03-14 DIAGNOSIS — M810 Age-related osteoporosis without current pathological fracture: Secondary | ICD-10-CM | POA: Diagnosis not present

## 2016-03-15 DIAGNOSIS — F339 Major depressive disorder, recurrent, unspecified: Secondary | ICD-10-CM | POA: Diagnosis not present

## 2016-03-20 DIAGNOSIS — F339 Major depressive disorder, recurrent, unspecified: Secondary | ICD-10-CM | POA: Diagnosis not present

## 2016-03-26 DIAGNOSIS — N814 Uterovaginal prolapse, unspecified: Secondary | ICD-10-CM | POA: Diagnosis not present

## 2016-03-29 DIAGNOSIS — M2010 Hallux valgus (acquired), unspecified foot: Secondary | ICD-10-CM | POA: Diagnosis not present

## 2016-03-29 DIAGNOSIS — Q845 Enlarged and hypertrophic nails: Secondary | ICD-10-CM | POA: Diagnosis not present

## 2016-03-29 DIAGNOSIS — B351 Tinea unguium: Secondary | ICD-10-CM | POA: Diagnosis not present

## 2016-03-29 DIAGNOSIS — L853 Xerosis cutis: Secondary | ICD-10-CM | POA: Diagnosis not present

## 2016-03-29 DIAGNOSIS — R600 Localized edema: Secondary | ICD-10-CM | POA: Diagnosis not present

## 2016-03-29 DIAGNOSIS — I739 Peripheral vascular disease, unspecified: Secondary | ICD-10-CM | POA: Diagnosis not present

## 2016-03-29 DIAGNOSIS — L603 Nail dystrophy: Secondary | ICD-10-CM | POA: Diagnosis not present

## 2016-04-02 DIAGNOSIS — M47812 Spondylosis without myelopathy or radiculopathy, cervical region: Secondary | ICD-10-CM | POA: Diagnosis not present

## 2016-04-02 DIAGNOSIS — G894 Chronic pain syndrome: Secondary | ICD-10-CM | POA: Diagnosis not present

## 2016-04-02 DIAGNOSIS — M461 Sacroiliitis, not elsewhere classified: Secondary | ICD-10-CM | POA: Diagnosis not present

## 2016-04-02 DIAGNOSIS — M5416 Radiculopathy, lumbar region: Secondary | ICD-10-CM | POA: Diagnosis not present

## 2016-04-03 DIAGNOSIS — Z23 Encounter for immunization: Secondary | ICD-10-CM | POA: Diagnosis not present

## 2016-04-10 DIAGNOSIS — N814 Uterovaginal prolapse, unspecified: Secondary | ICD-10-CM | POA: Diagnosis not present

## 2016-04-24 DIAGNOSIS — F339 Major depressive disorder, recurrent, unspecified: Secondary | ICD-10-CM | POA: Diagnosis not present

## 2016-04-29 ENCOUNTER — Emergency Department (HOSPITAL_COMMUNITY)
Admission: EM | Admit: 2016-04-29 | Discharge: 2016-04-29 | Disposition: A | Discharge status: Home / Self Care | Payer: Medicare Other | Attending: Emergency Medicine | Admitting: Emergency Medicine

## 2016-04-29 ENCOUNTER — Encounter (HOSPITAL_COMMUNITY): Payer: Self-pay

## 2016-04-29 DIAGNOSIS — I10 Essential (primary) hypertension: Secondary | ICD-10-CM | POA: Insufficient documentation

## 2016-04-29 DIAGNOSIS — E039 Hypothyroidism, unspecified: Secondary | ICD-10-CM | POA: Diagnosis not present

## 2016-04-29 DIAGNOSIS — Z853 Personal history of malignant neoplasm of breast: Secondary | ICD-10-CM | POA: Diagnosis not present

## 2016-04-29 DIAGNOSIS — R600 Localized edema: Secondary | ICD-10-CM | POA: Insufficient documentation

## 2016-04-29 DIAGNOSIS — M79605 Pain in left leg: Secondary | ICD-10-CM | POA: Diagnosis not present

## 2016-04-29 DIAGNOSIS — R531 Weakness: Secondary | ICD-10-CM | POA: Diagnosis not present

## 2016-04-29 DIAGNOSIS — R238 Other skin changes: Secondary | ICD-10-CM | POA: Diagnosis not present

## 2016-04-29 DIAGNOSIS — M7989 Other specified soft tissue disorders: Secondary | ICD-10-CM | POA: Diagnosis present

## 2016-04-29 DIAGNOSIS — Z7982 Long term (current) use of aspirin: Secondary | ICD-10-CM | POA: Insufficient documentation

## 2016-04-29 DIAGNOSIS — M79606 Pain in leg, unspecified: Secondary | ICD-10-CM | POA: Diagnosis not present

## 2016-04-29 DIAGNOSIS — Z7901 Long term (current) use of anticoagulants: Secondary | ICD-10-CM | POA: Diagnosis not present

## 2016-04-29 NOTE — ED Notes (Signed)
Bed: [id number] Expected date:  Expected time:  Means of arrival:  Comments: 82 yo leg pain

## 2016-04-29 NOTE — ED Triage Notes (Signed)
She reports chronic left leg edema. She states her left leg began weeping fluid yesterday and it is a bit sore today. She denies fever, injury, nor any other sign of current illness. She comes to [place] from [facility].

## 2016-04-29 NOTE — ED Provider Notes (Signed)
[facility] DEPT Provider Note   CSN: [id number] Arrival date & time: 04/29/16  [id number]     History   Chief Complaint Chief Complaint  Patient presents with  . Leg Swelling    HPI [name] is a 82 y.o. female. Presents for evaluation of leakage from a wound on her leg.  Patient has history of chronic bilateral lower extremity edema. She wears TED hose. She takes Lasix. She has been her normal state of health without complaint until yesterday. She was putting on her TED hose. She has had a wound on her left lower leg. She states she "picked the scab off". Is continued to drink clear fluids since that time and she presents here. No pain. No redness. Simple clear fluid. No blood.  HPI  Past Medical History:  Diagnosis Date  . Anxiety   . Atrial fibrillation ([place])   . Breast CA ([place])   . Breast cancer ([place])   . Constipation   . Depression   . Disorder of bone density and structure, unspecified   . Fibromyalgia   . Hyperkalemia   . Hyperlipemia   . Hypertension   . Impaired fasting glucose   . Retention of urine, unspecified   . Scoliosis   . Thyroid disease     Patient Active Problem List   Diagnosis Date Noted  . Respiratory failure with hypoxia ([place]) 11/02/2015  . Depression 11/02/2015  . Anxiety 11/02/2015  . Hypertension 11/02/2015  . Paroxysmal atrial fibrillation ([place]) 11/02/2015  . Hypothyroidism 11/02/2015  . Chronic venous stasis dermatitis 11/02/2015  . Chronic pain 11/02/2015  . Breast cancer ([place]) 11/02/2015    Past Surgical History:  Procedure Laterality Date  . FRACTURE SURGERY    . HIP FRACTURE SURGERY      OB History    No data available       Home Medications    Prior to Admission medications   Medication Sig Start Date End Date Taking? Authorizing Provider  acetaminophen (TYLENOL) 325 MG tablet Take 325 mg by mouth every 4 (four) hours as needed. For pain/headache/fever     Historical Provider, MD  alendronate (FOSAMAX) 70  MG tablet Take 70 mg by mouth once a week. Take with a full glass of water on an empty stomach.    Historical Provider, MD  amiodarone (PACERONE) 200 MG tablet Take 200 mg by mouth daily.      Historical Provider, MD  amLODipine (NORVASC) 10 MG tablet Take 10 mg by mouth daily.      Historical Provider, MD  apixaban (ELIQUIS) 5 MG TABS tablet Take 5 mg by mouth 2 (two) times daily.    Historical Provider, MD  aspirin EC 81 MG tablet Take 324 mg by mouth once.    Historical Provider, MD  baclofen (LIORESAL) 10 MG tablet Take 10 mg by mouth 3 (three) times daily.      Historical Provider, MD  benazepril (LOTENSIN) 20 MG tablet Take 20 mg by mouth at bedtime.     Historical Provider, MD  calcium carbonate (OSCAL) 1500 (600 Ca) MG TABS tablet Take 1,200 mg of elemental calcium by mouth daily with breakfast.    Historical Provider, MD  cholecalciferol (VITAMIN D) 1000 units tablet Take 1,000 Units by mouth daily.    Historical Provider, MD  docusate sodium (COLACE) 100 MG capsule Take 100 mg by mouth daily. & as needed for constipation     Historical Provider, MD  furosemide (LASIX) 80 MG tablet Take 80  mg by mouth every morning.      Historical Provider, MD  Gabapentin, PHN, (GRALISE) 600 MG TABS Take 1-2 tablets by mouth at bedtime. 2 tablets at bedtime, 1 tablet 3 times daily    Historical Provider, MD  levothyroxine (SYNTHROID, LEVOTHROID) 50 MCG tablet Take 50 mcg by mouth daily.      Historical Provider, MD  loperamide (IMODIUM) 2 MG capsule Take 2 mg by mouth 4 (four) times daily as needed. For loose stools/diarrhea     Historical Provider, MD  Magnesium Sulfate, Laxative, (EPSOM SALT PO) Take 1 Dose by mouth at bedtime.    Historical Provider, MD  menthol-zinc oxide (GOLD BOND) powder Apply 1 application topically daily.    Historical Provider, MD  Multiple Vitamins-Minerals (MULTIVITAMINS THER. W/MINERALS) TABS Take 1 tablet by mouth daily.      Historical Provider, MD  polyethylene glycol  (MIRALAX / GLYCOLAX) packet Take 17 g by mouth daily as needed. For constipation.     Historical Provider, MD  potassium chloride SA (K-DUR,KLOR-CON) 20 MEQ tablet Take 20 mEq by mouth daily.      Historical Provider, MD  tamoxifen (NOLVADEX) 10 MG tablet Take 10 mg by mouth daily.      Historical Provider, MD  venlafaxine (EFFEXOR-XR) 150 MG 24 hr capsule Take 150 mg by mouth daily.      Historical Provider, MD    Family History No family history on file.  Social History Social History  Substance Use Topics  . Smoking status: Never Smoker  . Smokeless tobacco: Not on file  . Alcohol use No     Allergies   Sulfa antibiotics   Review of Systems Review of Systems  Constitutional: Negative for appetite change, chills, diaphoresis, fatigue and fever.  HENT: Negative for mouth sores, sore throat and trouble swallowing.   Eyes: Negative for visual disturbance.  Respiratory: Negative for cough, chest tightness, shortness of breath and wheezing.   Cardiovascular: Positive for leg swelling. Negative for chest pain.  Gastrointestinal: Negative for abdominal distention, abdominal pain, diarrhea, nausea and vomiting.  Endocrine: Negative for polydipsia, polyphagia and polyuria.  Genitourinary: Negative for dysuria, frequency and hematuria.  Musculoskeletal: Negative for gait problem.  Skin: Positive for wound. Negative for color change, pallor and rash.  Neurological: Negative for dizziness, syncope, light-headedness and headaches.  Hematological: Does not bruise/bleed easily.  Psychiatric/Behavioral: Negative for behavioral problems and confusion.     Physical Exam Updated Vital Signs BP 140/69 (BP Location: Left Arm)   Pulse (!) 56   Temp 98.6 F (37 C) (Oral)   Resp 18   SpO2 95%   Physical Exam  Constitutional: She is oriented to person, place, and time. She appears well-developed and well-nourished. No distress.  HENT:  Head: Normocephalic.  Eyes: Conjunctivae are  normal. Pupils are equal, round, and reactive to light. No scleral icterus.  Neck: Normal range of motion. Neck supple. No thyromegaly present.  Cardiovascular: Normal rate and regular rhythm.  Exam reveals no gallop and no friction rub.   No murmur heard. Pulmonary/Chest: Effort normal and breath sounds normal. No respiratory distress. She has no wheezes. She has no rales.  Abdominal: Soft. Bowel sounds are normal. She exhibits no distension. There is no tenderness. There is no rebound.  Musculoskeletal: Normal range of motion.  Neurological: She is alert and oriented to person, place, and time.  Skin: Skin is warm and dry. No rash noted.  2+ symmetric bilateral lower extremity edema. Area of concern is a  wound to the left mid shin anteriorly approximate the size of a thumbprint. It is draining clear fluid. Otherwise appears well. Not red swollen or painful. Legs are symmetric.  Psychiatric: She has a normal mood and affect. Her behavior is normal.     ED Treatments / Results  Labs (all labs ordered are listed, but only abnormal results are displayed) Labs Reviewed - No data to display  EKG  EKG Interpretation None       Radiology No results found.  Procedures Procedures (including critical care time)  Medications Ordered in ED Medications - No data to display   Initial Impression / Assessment and Plan / ED Course  I have reviewed the triage vital signs and the nursing notes.  Pertinent labs & imaging results that were available during my care of the patient were reviewed by me and considered in my medical decision making (see chart for details).  Clinical Course     ABG padded Ace wrap applied. Left elevate her legs and performs wound care with DVT to collect drainage. Recheck with pain and redness swelling.  Final Clinical Impressions(s) / ED Diagnoses   Final diagnoses:  Leg edema    New Prescriptions New Prescriptions   No medications on file     Tanna Furry, MD 04/29/16 [phone]

## 2016-04-29 NOTE — Discharge Instructions (Signed)
Use ABD pads over the area of leakage. Apply Ace wrap

## 2016-04-29 NOTE — ED Notes (Addendum)
Dressing; then ACE wrap applied to left lower leg. I have just phoned [facility] for transport.

## 2016-05-01 DIAGNOSIS — G894 Chronic pain syndrome: Secondary | ICD-10-CM | POA: Diagnosis not present

## 2016-05-01 DIAGNOSIS — M5416 Radiculopathy, lumbar region: Secondary | ICD-10-CM | POA: Diagnosis not present

## 2016-05-01 DIAGNOSIS — M461 Sacroiliitis, not elsewhere classified: Secondary | ICD-10-CM | POA: Diagnosis not present

## 2016-05-01 DIAGNOSIS — M47812 Spondylosis without myelopathy or radiculopathy, cervical region: Secondary | ICD-10-CM | POA: Diagnosis not present

## 2016-05-03 DIAGNOSIS — I890 Lymphedema, not elsewhere classified: Secondary | ICD-10-CM | POA: Diagnosis not present

## 2016-05-03 DIAGNOSIS — L02419 Cutaneous abscess of limb, unspecified: Secondary | ICD-10-CM | POA: Diagnosis not present

## 2016-05-03 DIAGNOSIS — F339 Major depressive disorder, recurrent, unspecified: Secondary | ICD-10-CM | POA: Diagnosis not present

## 2016-05-03 DIAGNOSIS — M810 Age-related osteoporosis without current pathological fracture: Secondary | ICD-10-CM | POA: Diagnosis not present

## 2016-05-11 DIAGNOSIS — L02416 Cutaneous abscess of left lower limb: Secondary | ICD-10-CM | POA: Diagnosis not present

## 2016-05-11 DIAGNOSIS — F329 Major depressive disorder, single episode, unspecified: Secondary | ICD-10-CM | POA: Diagnosis not present

## 2016-05-11 DIAGNOSIS — I480 Paroxysmal atrial fibrillation: Secondary | ICD-10-CM | POA: Diagnosis not present

## 2016-05-11 DIAGNOSIS — M797 Fibromyalgia: Secondary | ICD-10-CM | POA: Diagnosis not present

## 2016-05-11 DIAGNOSIS — M1990 Unspecified osteoarthritis, unspecified site: Secondary | ICD-10-CM | POA: Diagnosis not present

## 2016-05-11 DIAGNOSIS — I890 Lymphedema, not elsewhere classified: Secondary | ICD-10-CM | POA: Diagnosis not present

## 2016-05-11 DIAGNOSIS — L03116 Cellulitis of left lower limb: Secondary | ICD-10-CM | POA: Diagnosis not present

## 2016-05-11 DIAGNOSIS — I10 Essential (primary) hypertension: Secondary | ICD-10-CM | POA: Diagnosis not present

## 2016-05-16 DIAGNOSIS — R300 Dysuria: Secondary | ICD-10-CM | POA: Diagnosis not present

## 2016-05-16 DIAGNOSIS — N812 Incomplete uterovaginal prolapse: Secondary | ICD-10-CM | POA: Diagnosis not present

## 2016-05-22 DIAGNOSIS — F339 Major depressive disorder, recurrent, unspecified: Secondary | ICD-10-CM | POA: Diagnosis not present

## 2016-05-29 DIAGNOSIS — F339 Major depressive disorder, recurrent, unspecified: Secondary | ICD-10-CM | POA: Diagnosis not present

## 2016-06-07 DIAGNOSIS — F339 Major depressive disorder, recurrent, unspecified: Secondary | ICD-10-CM | POA: Diagnosis not present

## 2016-06-12 DIAGNOSIS — F339 Major depressive disorder, recurrent, unspecified: Secondary | ICD-10-CM | POA: Diagnosis not present

## 2016-06-19 DIAGNOSIS — F339 Major depressive disorder, recurrent, unspecified: Secondary | ICD-10-CM | POA: Diagnosis not present

## 2016-06-26 DIAGNOSIS — F339 Major depressive disorder, recurrent, unspecified: Secondary | ICD-10-CM | POA: Diagnosis not present

## 2016-07-02 DIAGNOSIS — F339 Major depressive disorder, recurrent, unspecified: Secondary | ICD-10-CM | POA: Diagnosis not present

## 2016-07-03 DIAGNOSIS — E782 Mixed hyperlipidemia: Secondary | ICD-10-CM | POA: Diagnosis not present

## 2016-07-03 DIAGNOSIS — C50919 Malignant neoplasm of unspecified site of unspecified female breast: Secondary | ICD-10-CM | POA: Diagnosis not present

## 2016-07-03 DIAGNOSIS — M797 Fibromyalgia: Secondary | ICD-10-CM | POA: Diagnosis not present

## 2016-07-03 DIAGNOSIS — F329 Major depressive disorder, single episode, unspecified: Secondary | ICD-10-CM | POA: Diagnosis not present

## 2016-07-03 DIAGNOSIS — I119 Hypertensive heart disease without heart failure: Secondary | ICD-10-CM | POA: Diagnosis not present

## 2016-07-03 DIAGNOSIS — I890 Lymphedema, not elsewhere classified: Secondary | ICD-10-CM | POA: Diagnosis not present

## 2016-07-03 DIAGNOSIS — E039 Hypothyroidism, unspecified: Secondary | ICD-10-CM | POA: Diagnosis not present

## 2016-07-03 DIAGNOSIS — M545 Low back pain: Secondary | ICD-10-CM | POA: Diagnosis not present

## 2016-07-03 DIAGNOSIS — I4891 Unspecified atrial fibrillation: Secondary | ICD-10-CM | POA: Diagnosis not present

## 2016-07-03 DIAGNOSIS — E669 Obesity, unspecified: Secondary | ICD-10-CM | POA: Diagnosis not present

## 2016-07-05 DIAGNOSIS — F339 Major depressive disorder, recurrent, unspecified: Secondary | ICD-10-CM | POA: Diagnosis not present

## 2016-07-10 DIAGNOSIS — T1512XA Foreign body in conjunctival sac, left eye, initial encounter: Secondary | ICD-10-CM | POA: Diagnosis not present

## 2016-07-17 DIAGNOSIS — N812 Incomplete uterovaginal prolapse: Secondary | ICD-10-CM | POA: Diagnosis not present

## 2016-07-24 DIAGNOSIS — L84 Corns and callosities: Secondary | ICD-10-CM | POA: Diagnosis not present

## 2016-07-24 DIAGNOSIS — I739 Peripheral vascular disease, unspecified: Secondary | ICD-10-CM | POA: Diagnosis not present

## 2016-07-24 DIAGNOSIS — L603 Nail dystrophy: Secondary | ICD-10-CM | POA: Diagnosis not present

## 2016-08-02 DIAGNOSIS — F339 Major depressive disorder, recurrent, unspecified: Secondary | ICD-10-CM | POA: Diagnosis not present

## 2016-08-14 DIAGNOSIS — F339 Major depressive disorder, recurrent, unspecified: Secondary | ICD-10-CM | POA: Diagnosis not present

## 2016-08-21 DIAGNOSIS — F339 Major depressive disorder, recurrent, unspecified: Secondary | ICD-10-CM | POA: Diagnosis not present

## 2016-08-30 DIAGNOSIS — F339 Major depressive disorder, recurrent, unspecified: Secondary | ICD-10-CM | POA: Diagnosis not present

## 2016-09-04 DIAGNOSIS — F339 Major depressive disorder, recurrent, unspecified: Secondary | ICD-10-CM | POA: Diagnosis not present

## 2016-09-18 DIAGNOSIS — F339 Major depressive disorder, recurrent, unspecified: Secondary | ICD-10-CM | POA: Diagnosis not present

## 2016-09-25 DIAGNOSIS — F339 Major depressive disorder, recurrent, unspecified: Secondary | ICD-10-CM | POA: Diagnosis not present

## 2016-10-11 DIAGNOSIS — F339 Major depressive disorder, recurrent, unspecified: Secondary | ICD-10-CM | POA: Diagnosis not present

## 2016-10-17 DIAGNOSIS — N812 Incomplete uterovaginal prolapse: Secondary | ICD-10-CM | POA: Diagnosis not present

## 2016-10-23 DIAGNOSIS — L603 Nail dystrophy: Secondary | ICD-10-CM | POA: Diagnosis not present

## 2016-10-23 DIAGNOSIS — I739 Peripheral vascular disease, unspecified: Secondary | ICD-10-CM | POA: Diagnosis not present

## 2016-10-23 DIAGNOSIS — F339 Major depressive disorder, recurrent, unspecified: Secondary | ICD-10-CM | POA: Diagnosis not present

## 2016-10-25 ENCOUNTER — Encounter (HOSPITAL_COMMUNITY): Payer: Self-pay | Admitting: Emergency Medicine

## 2016-10-25 ENCOUNTER — Emergency Department (HOSPITAL_COMMUNITY)
Admission: EM | Admit: 2016-10-25 | Discharge: 2016-10-26 | Disposition: A | Discharge status: Home / Self Care | Payer: Medicare Other | Attending: Emergency Medicine | Admitting: Emergency Medicine

## 2016-10-25 DIAGNOSIS — G44019 Episodic cluster headache, not intractable: Secondary | ICD-10-CM | POA: Diagnosis not present

## 2016-10-25 DIAGNOSIS — M79605 Pain in left leg: Secondary | ICD-10-CM | POA: Diagnosis not present

## 2016-10-25 DIAGNOSIS — I4891 Unspecified atrial fibrillation: Secondary | ICD-10-CM | POA: Diagnosis not present

## 2016-10-25 DIAGNOSIS — R51 Headache: Secondary | ICD-10-CM

## 2016-10-25 DIAGNOSIS — Z853 Personal history of malignant neoplasm of breast: Secondary | ICD-10-CM | POA: Insufficient documentation

## 2016-10-25 DIAGNOSIS — R519 Headache, unspecified: Secondary | ICD-10-CM

## 2016-10-25 DIAGNOSIS — I10 Essential (primary) hypertension: Secondary | ICD-10-CM | POA: Diagnosis not present

## 2016-10-25 DIAGNOSIS — E039 Hypothyroidism, unspecified: Secondary | ICD-10-CM | POA: Insufficient documentation

## 2016-10-25 MED ORDER — ACETAMINOPHEN 325 MG PO TABS
650.0000 mg | ORAL_TABLET | Freq: Once | ORAL | Status: AC
  Administered 2016-10-26: 650 mg via ORAL
  Filled 2016-10-25: qty 2

## 2016-10-25 NOTE — ED Provider Notes (Signed)
[facility] DEPT Provider Note   CSN: [id number] Arrival date & time: 10/25/16  2215  By signing my name below, I, [name], attest that this documentation has been prepared under the direction and in the presence of Karen Porter, MD. Electronically Signed: Collene Downs, Scribe. 10/25/16. 11:48 PM.  Time seen 23:39 PM  History   Chief Complaint Chief Complaint  Patient presents with  . Leg Swelling  . Headache   HPI Comments: [name] is a 83 y.o. female with a history of atrial fibrillation on eliquis, breast cancer, and respiratory failure with hypoxia, who presents to the Emergency Department complaining of a sudden-onset, intermittent left posterior headache that began one month ago. Patient states she developed a headache about 8 pm that intermittently lasts "a few seconds". Patient states she has only developed this headache twice in the past month. Patient states she has had a similar headache in the past. She states the last time it happened a month ago she went for a walk in the headache went away. Patient states "it hurts a little bit". Patient is a resident at [facility]. Patient reports associated blurred vision (lasted one minute). Patient reports taking tylenol at 10 PM with mild relief. Patient ambulates with a walker at baseline. Patient denies any numbness, tingling, weakness, fever, chills, nausea, vomiting, or photophobia.   Patient also complains of chronic pain in her left leg from a pinched nerve. This is followed at pain management. She also has chronic back pain. She states it is not worse than her normal. Patient was advised that since it is not worse that she would need to follow-up with her pain management.  The history is provided by the patient. No language interpreter was used.   PCP [name], MD   Past Medical History:  Diagnosis Date  . Anxiety   . Atrial fibrillation ([place])   . Breast CA ([place])   . Breast cancer ([place])   .  Constipation   . Depression   . Disorder of bone density and structure, unspecified   . Fibromyalgia   . Hyperkalemia   . Hyperlipemia   . Hypertension   . Impaired fasting glucose   . Retention of urine, unspecified   . Scoliosis   . Thyroid disease     Patient Active Problem List   Diagnosis Date Noted  . Respiratory failure with hypoxia ([place]) 11/02/2015  . Depression 11/02/2015  . Anxiety 11/02/2015  . Hypertension 11/02/2015  . Paroxysmal atrial fibrillation ([place]) 11/02/2015  . Hypothyroidism 11/02/2015  . Chronic venous stasis dermatitis 11/02/2015  . Chronic pain 11/02/2015  . Breast cancer ([place]) 11/02/2015    Past Surgical History:  Procedure Laterality Date  . FRACTURE SURGERY    . HIP FRACTURE SURGERY      OB History    No data available       Home Medications    Prior to Admission medications   Medication Sig Start Date End Date Taking? Authorizing Provider  acetaminophen (TYLENOL) 325 MG tablet Take 325 mg by mouth every 4 (four) hours as needed. For pain/headache/fever    Yes [provider]  amiodarone (PACERONE) 200 MG tablet Take 200 mg by mouth daily.     Yes [provider]  amLODipine (NORVASC) 10 MG tablet Take 10 mg by mouth daily.     Yes [provider]  apixaban (ELIQUIS) 5 MG TABS tablet Take 5 mg by mouth 2 (two) times daily.   Yes [provider]  baclofen (LIORESAL) 10 MG tablet Take 10 mg by mouth 3 (three) times daily.     Yes [provider]  benazepril (LOTENSIN) 20 MG tablet Take 20 mg by mouth at bedtime.    Yes [provider]  calcium carbonate (OSCAL) 1500 (600 Ca) MG TABS tablet Take 1,200 mg of elemental calcium by mouth daily with breakfast.   Yes [provider]  calcium carbonate (TUMS - DOSED IN MG ELEMENTAL CALCIUM) 500 MG chewable tablet Chew 2 tablets by mouth as needed for indigestion or heartburn.   Yes [provider]  carboxymethylcellulose  (REFRESH PLUS) 0.5 % SOLN Place 1 drop into both eyes 4 (four) times daily as needed (dry eyes).   Yes [provider]  cholecalciferol (VITAMIN D) 1000 units tablet Take 1,000 Units by mouth daily.   Yes [provider]  furosemide (LASIX) 80 MG tablet Take 80 mg by mouth every morning.     Yes [provider]  Gabapentin, PHN, (GRALISE) 600 MG TABS Take 1-2 tablets by mouth at bedtime. 2 tablets at bedtime, 1 tablet 3 times daily   Yes [provider]  guaiFENesin (MUCINEX) 600 MG 12 hr tablet Take 600 mg by mouth 2 (two) times daily as needed for cough.   Yes [provider]  levothyroxine (SYNTHROID, LEVOTHROID) 50 MCG tablet Take 50 mcg by mouth daily.     Yes [provider]  loperamide (IMODIUM) 2 MG capsule Take 2 mg by mouth 4 (four) times daily as needed. For loose stools/diarrhea    Yes [provider]  menthol-zinc oxide (GOLD BOND) powder Apply 1 application topically daily.   Yes [provider]  Multiple Vitamins-Minerals (MULTIVITAMINS THER. W/MINERALS) TABS Take 1 tablet by mouth daily.     Yes [provider]  OXYQUINOLONE SULFATE VAGINAL (TRIMO-SAN) 0.025 % GEL Place 1 application vaginally at bedtime. On Tuesday and friday   Yes [provider]  polyethylene glycol (MIRALAX / GLYCOLAX) packet Take 17 g by mouth daily as needed. For constipation.    Yes [provider]  potassium chloride SA (K-DUR,KLOR-CON) 20 MEQ tablet Take 20 mEq by mouth daily.     Yes [provider]  tamoxifen (NOLVADEX) 10 MG tablet Take 10 mg by mouth daily.     Yes [provider]  tolnaftate (TINACTIN) 1 % powder Apply 1 application topically as needed for itching.   Yes [provider]  traMADol (ULTRAM) 50 MG tablet Take 50 mg by mouth every 6 (six) hours as needed for moderate pain.   Yes [provider]  venlafaxine (EFFEXOR-XR) 150 MG 24 hr capsule Take 150 mg by  mouth daily.     Yes [provider]    Family History No family history on file.  Social History Social History  Substance Use Topics  . Smoking status: Never Smoker  . Smokeless tobacco: Never Used  . Alcohol use No  lives in ALF Uses a walker   Allergies   Sulfa antibiotics   Review of Systems Review of Systems  Constitutional: Negative for chills and fever.  Eyes: Positive for visual disturbance. Negative for photophobia.  Gastrointestinal: Negative for nausea and vomiting.  Neurological: Positive for headaches. Negative for dizziness, weakness, light-headedness and numbness.  All other systems reviewed and are negative.    Physical Exam Updated Vital Signs BP (!) 151/68 (BP Location: Left Arm)   Pulse (!) 57   Temp 98.3 F (36.8 C) (Oral)  Resp 16   SpO2 91%   Physical Exam  Constitutional: She is oriented to person, place, and time.  Non-toxic appearance. She does not appear ill. No distress.  Frail elderly female  HENT:  Head: Normocephalic and atraumatic.  Right Ear: External ear normal.  Left Ear: External ear normal.  Nose: Nose normal. No mucosal edema or rhinorrhea.  Mouth/Throat: Oropharynx is clear and moist and mucous membranes are normal. No dental abscesses or uvula swelling.  Eyes: Conjunctivae and EOM are normal. Pupils are equal, round, and reactive to light.  Neck: Normal range of motion and full passive range of motion without pain. Neck supple.  Cardiovascular: Normal rate, regular rhythm and normal heart sounds.  Exam reveals no gallop and no friction rub.   No murmur heard. Pulmonary/Chest: Effort normal and breath sounds normal. No respiratory distress. She has no wheezes. She has no rhonchi. She has no rales. She exhibits no tenderness and no crepitus.  Abdominal: Soft. Normal appearance and bowel sounds are normal. She exhibits no distension. There is no tenderness. There is no rebound and no guarding.  Musculoskeletal:  Normal range of motion. She exhibits no edema or tenderness.  Moves all extremities well.   Neurological: She is alert and oriented to person, place, and time. She has normal strength. No cranial nerve deficit.  No pronator drift , grips are equal. She has difficulty holding her left leg against gravity, but this is chronic.   Skin: Skin is warm, dry and intact. No rash noted. No erythema. No pallor.  Chronic skin changes of both lower extremities consistent with chronic venous stasis with diffuse non pitting edema, right worst than left.   Psychiatric: She has a normal mood and affect. Her speech is normal and behavior is normal. Her mood appears not anxious.  Nursing note and vitals reviewed.    ED Treatments / Results   Results for orders placed or performed during the hospital encounter of 10/25/16  Comprehensive metabolic panel  Result Value Ref Range   Sodium 140 135 - 145 mmol/L   Potassium 4.1 3.5 - 5.1 mmol/L   Chloride 104 101 - 111 mmol/L   CO2 30 22 - 32 mmol/L   Glucose, Bld 119 (H) 65 - 99 mg/dL   BUN 16 6 - 20 mg/dL   Creatinine, Ser 0.64 0.44 - 1.00 mg/dL   Calcium 8.7 (L) 8.9 - 10.3 mg/dL   Total Protein 6.7 6.5 - 8.1 g/dL   Albumin 3.8 3.5 - 5.0 g/dL   AST 16 15 - 41 U/L   ALT 19 14 - 54 U/L   Alkaline Phosphatase 44 38 - 126 U/L   Total Bilirubin 0.4 0.3 - 1.2 mg/dL   GFR calc non Af Amer >60 >60 mL/min   GFR calc Af Amer >60 >60 mL/min   Anion gap 6 5 - 15  CBC with Differential  Result Value Ref Range   WBC 7.8 4.0 - 10.5 K/uL   RBC 4.33 3.87 - 5.11 MIL/uL   Hemoglobin 12.6 12.0 - 15.0 g/dL   HCT 39.2 36.0 - 46.0 %   MCV 90.5 78.0 - 100.0 fL   MCH 29.1 26.0 - 34.0 pg   MCHC 32.1 30.0 - 36.0 g/dL   RDW 14.3 11.5 - 15.5 %   Platelets 211 150 - 400 K/uL   Neutrophils Relative % 56 %   Neutro Abs 4.4 1.7 - 7.7 K/uL   Lymphocytes Relative 26 %   Lymphs Abs 2.0  0.7 - 4.0 K/uL   Monocytes Relative 13 %   Monocytes Absolute 1.0 0.1 - 1.0 K/uL    Eosinophils Relative 5 %   Eosinophils Absolute 0.4 0.0 - 0.7 K/uL   Basophils Relative 0 %   Basophils Absolute 0.0 0.0 - 0.1 K/uL   Laboratory interpretation all normal  Ct Head Wo Contrast  Result Date: 10/26/2016 CLINICAL DATA:  Acute onset of hypoxia. Intermittent chronic posterior headache. Blurred vision. Initial encounter. EXAM: CT HEAD WITHOUT CONTRAST TECHNIQUE: Contiguous axial images were obtained from the base of the skull through the vertex without intravenous contrast. COMPARISON:  CT of the head performed 11/02/2015 FINDINGS: Brain: No evidence of acute infarction, hemorrhage, hydrocephalus, extra-axial collection or mass lesion/mass effect. Prominence of the ventricles and sulci reflects mild to moderate cortical volume loss. Scattered periventricular and subcortical matter change likely reflects small vessel ischemic microangiopathy. Mild cerebellar atrophy is noted. The brainstem and fourth ventricle are within normal limits. The basal ganglia are unremarkable in appearance. The cerebral hemispheres demonstrate grossly normal gray-white differentiation. No mass effect or midline shift is seen. Vascular: No hyperdense vessel or unexpected calcification. Skull: There is no evidence of fracture; visualized osseous structures are unremarkable in appearance. Sinuses/Orbits: The visualized portions of the orbits are within normal limits. The paranasal sinuses and mastoid air cells are well-aerated. Other: No significant soft tissue abnormalities are seen. IMPRESSION: 1. No acute intracranial pathology seen on CT. 2. Mild to moderate cortical volume loss and scattered small vessel ischemic microangiopathy. Electronically Signed   By: Garald Balding M.D.   On: 10/26/2016 00:45     Procedures Procedures (including critical care time)  Medications Ordered in ED Medications  acetaminophen (TYLENOL) tablet 650 mg (650 mg Oral Given 10/26/16 0037)     Initial Impression / Assessment and  Plan / ED Course  I have reviewed the triage vital signs and the nursing notes.  Pertinent labs & imaging results that were available during my care of the patient were reviewed by me and considered in my medical decision making (see chart for details).     DIAGNOSTIC STUDIES: Oxygen Saturation is 91% on RA, low by my interpretation.    COORDINATION OF CARE: 11:47 PM Discussed treatment plan with pt at bedside and pt agreed to plan, which includes a head CT scan. She was given Tylenol.  2:26 AM  Patient states her headache has mildly improved, but still persists. Patient states she does not want any more medications for this. We discussed her CT results and patient seems very disappointed that there was nothing abnormal seen. She was released back to her facility.    Final Clinical Impressions(s) / ED Diagnoses   Final diagnoses:  Nonintractable episodic headache, unspecified headache type    New Prescriptions OTC acetaminophen  Plan discharge  Karen Porter, MD, FACEP  I personally performed the services described in this documentation, which was scribed in my presence. The recorded information has been reviewed and considered.  Karen Porter, MD, Barbette Or, MD 10/26/16 [phone]

## 2016-10-25 NOTE — ED Triage Notes (Signed)
Pt comes from [facility] [address] with complaints of left leg pain.  Pt describes it as "funny". Bilateral edema present in both legs which have been chronic for the past 7 years.  Endorses arthritis and a pinched nerve that she states has caused these symptoms before.  Pt also endorses posterior headache.  Pt denies visual changes.  A&O x4.  Neuro intact. Vitals BP 136/66, HR 62 in route. CBG 120. Ambulatory with assistance.

## 2016-10-25 NOTE — ED Notes (Signed)
Bed: WA04 Expected date:  Expected time:  Means of arrival:  Comments: EMS dependent edema/leg pain

## 2016-10-26 ENCOUNTER — Emergency Department (HOSPITAL_COMMUNITY): Payer: Medicare Other

## 2016-10-26 DIAGNOSIS — R51 Headache: Secondary | ICD-10-CM | POA: Diagnosis not present

## 2016-10-26 DIAGNOSIS — R609 Edema, unspecified: Secondary | ICD-10-CM | POA: Diagnosis not present

## 2016-10-26 DIAGNOSIS — G44019 Episodic cluster headache, not intractable: Secondary | ICD-10-CM | POA: Diagnosis not present

## 2016-10-26 LAB — COMPREHENSIVE METABOLIC PANEL
ALK PHOS: 44 U/L (ref 38–126)
ALT: 19 U/L (ref 14–54)
ANION GAP: 6 (ref 5–15)
AST: 16 U/L (ref 15–41)
Albumin: 3.8 g/dL (ref 3.5–5.0)
BILIRUBIN TOTAL: 0.4 mg/dL (ref 0.3–1.2)
BUN: 16 mg/dL (ref 6–20)
CALCIUM: 8.7 mg/dL — AB (ref 8.9–10.3)
CO2: 30 mmol/L (ref 22–32)
Chloride: 104 mmol/L (ref 101–111)
Creatinine, Ser: 0.64 mg/dL (ref 0.44–1.00)
GFR calc non Af Amer: >60 mL/min (ref >60)
GLUCOSE: 119 mg/dL — AB (ref 65–99)
Potassium: 4.1 mmol/L (ref 3.5–5.1)
Sodium: 140 mmol/L (ref 135–145)
TOTAL PROTEIN: 6.7 g/dL (ref 6.5–8.1)

## 2016-10-26 LAB — CBC WITH DIFFERENTIAL/PLATELET
BASOS PCT: 0 %
Basophils Absolute: 0 10*3/uL (ref 0.0–0.1)
Eosinophils Absolute: 0.4 10*3/uL (ref 0.0–0.7)
Eosinophils Relative: 5 %
HEMATOCRIT: 39.2 % (ref 36.0–46.0)
Hemoglobin: 12.6 g/dL (ref 12.0–15.0)
Lymphocytes Relative: 26 %
Lymphs Abs: 2 10*3/uL (ref 0.7–4.0)
MCH: 29.1 pg (ref 26.0–34.0)
MCHC: 32.1 g/dL (ref 30.0–36.0)
MCV: 90.5 fL (ref 78.0–100.0)
MONO ABS: 1 10*3/uL (ref 0.1–1.0)
Monocytes Relative: 13 %
NEUTROS ABS: 4.4 10*3/uL (ref 1.7–7.7)
NEUTROS PCT: 56 %
Platelets: 211 10*3/uL (ref 150–400)
RBC: 4.33 MIL/uL (ref 3.87–5.11)
RDW: 14.3 % (ref 11.5–15.5)
WBC: 7.8 10*3/uL (ref 4.0–10.5)

## 2016-10-26 NOTE — Discharge Instructions (Signed)
Use ice packs on your head for comfort. Take acetaminophen as needed for headache. Recheck for any problems on the head injury sheet. Follow up with your pain management about your chronic back and leg pain.

## 2016-10-30 DIAGNOSIS — F339 Major depressive disorder, recurrent, unspecified: Secondary | ICD-10-CM | POA: Diagnosis not present

## 2016-11-06 DIAGNOSIS — M8580 Other specified disorders of bone density and structure, unspecified site: Secondary | ICD-10-CM | POA: Diagnosis not present

## 2016-11-06 DIAGNOSIS — F339 Major depressive disorder, recurrent, unspecified: Secondary | ICD-10-CM | POA: Diagnosis not present

## 2016-11-08 DIAGNOSIS — F339 Major depressive disorder, recurrent, unspecified: Secondary | ICD-10-CM | POA: Diagnosis not present

## 2016-11-13 DIAGNOSIS — F339 Major depressive disorder, recurrent, unspecified: Secondary | ICD-10-CM | POA: Diagnosis not present

## 2016-11-20 DIAGNOSIS — F339 Major depressive disorder, recurrent, unspecified: Secondary | ICD-10-CM | POA: Diagnosis not present

## 2016-11-21 ENCOUNTER — Encounter (HOSPITAL_COMMUNITY): Payer: Self-pay | Admitting: Emergency Medicine

## 2016-11-21 ENCOUNTER — Emergency Department (HOSPITAL_COMMUNITY): Payer: Medicare Other

## 2016-11-21 ENCOUNTER — Emergency Department (HOSPITAL_COMMUNITY)
Admission: EM | Admit: 2016-11-21 | Discharge: 2016-11-21 | Disposition: A | Discharge status: Home / Self Care | Payer: Medicare Other | Attending: Emergency Medicine | Admitting: Emergency Medicine

## 2016-11-21 DIAGNOSIS — Z79810 Long term (current) use of selective estrogen receptor modulators (SERMs): Secondary | ICD-10-CM | POA: Diagnosis not present

## 2016-11-21 DIAGNOSIS — I878 Other specified disorders of veins: Secondary | ICD-10-CM | POA: Insufficient documentation

## 2016-11-21 DIAGNOSIS — G8929 Other chronic pain: Secondary | ICD-10-CM | POA: Diagnosis not present

## 2016-11-21 DIAGNOSIS — Z7901 Long term (current) use of anticoagulants: Secondary | ICD-10-CM | POA: Diagnosis not present

## 2016-11-21 DIAGNOSIS — R918 Other nonspecific abnormal finding of lung field: Secondary | ICD-10-CM | POA: Diagnosis not present

## 2016-11-21 DIAGNOSIS — Z79899 Other long term (current) drug therapy: Secondary | ICD-10-CM | POA: Diagnosis not present

## 2016-11-21 DIAGNOSIS — E039 Hypothyroidism, unspecified: Secondary | ICD-10-CM | POA: Insufficient documentation

## 2016-11-21 DIAGNOSIS — M546 Pain in thoracic spine: Secondary | ICD-10-CM | POA: Diagnosis not present

## 2016-11-21 DIAGNOSIS — M545 Low back pain: Secondary | ICD-10-CM | POA: Insufficient documentation

## 2016-11-21 DIAGNOSIS — M549 Dorsalgia, unspecified: Secondary | ICD-10-CM | POA: Diagnosis not present

## 2016-11-21 DIAGNOSIS — M25559 Pain in unspecified hip: Secondary | ICD-10-CM | POA: Diagnosis not present

## 2016-11-21 DIAGNOSIS — I10 Essential (primary) hypertension: Secondary | ICD-10-CM | POA: Diagnosis not present

## 2016-11-21 DIAGNOSIS — I872 Venous insufficiency (chronic) (peripheral): Secondary | ICD-10-CM | POA: Diagnosis not present

## 2016-11-21 LAB — URINALYSIS, ROUTINE W REFLEX MICROSCOPIC
BILIRUBIN URINE: NEGATIVE
GLUCOSE, UA: NEGATIVE mg/dL
Hgb urine dipstick: NEGATIVE
Ketones, ur: NEGATIVE mg/dL
Nitrite: NEGATIVE
PH: 6 (ref 5.0–8.0)
Protein, ur: NEGATIVE mg/dL
SPECIFIC GRAVITY, URINE: 1.005 (ref 1.005–1.030)

## 2016-11-21 MED ORDER — TRAMADOL HCL 50 MG PO TABS
50.0000 mg | ORAL_TABLET | Freq: Once | ORAL | Status: AC
  Administered 2016-11-21: 50 mg via ORAL
  Filled 2016-11-21: qty 1

## 2016-11-21 NOTE — Discharge Instructions (Signed)
Continue current medications. 

## 2016-11-21 NOTE — ED Notes (Signed)
Nursing entries for 1119 and 1123 charted on the wrong person by [facility] B. RN

## 2016-11-21 NOTE — ED Triage Notes (Signed)
Patient called EMS for upper back pain that started 6 hours ago. No chest, No SOB, no nausea, no vomiting, no diarrhea. Patient she worried about the discoloration on left toes, left foot, and left back of leg.

## 2016-11-21 NOTE — ED Notes (Signed)
[facility] notified of need for transport. 

## 2016-11-21 NOTE — ED Provider Notes (Signed)
[facility] DEPT Provider Note   CSN: [id number] Arrival date & time: 11/21/16  1105     History   Chief Complaint No chief complaint on file.   HPI [name] is a 83 y.o. female. Chief complaint is back pain, and discolored foot  HPI:  Patient essentially has 2 complaints. One the discoloration of her foot from her chronic venous stasis has worsened over the last few months. Just doesn't pain her left lower back. Has a history of back pain occasionally takes tramadol for this. No anterior chest pain no shortness of breath. No acute changes with her foot. She has normal use and feeling. Has chronic edema and chronic venous stasis.  Past Medical History:  Diagnosis Date  . Anxiety   . Atrial fibrillation ([place])   . Breast CA ([place])   . Breast cancer ([place])   . Constipation   . Depression   . Disorder of bone density and structure, unspecified   . Fibromyalgia   . Hyperkalemia   . Hyperlipemia   . Hypertension   . Impaired fasting glucose   . Retention of urine, unspecified   . Scoliosis   . Thyroid disease     Patient Active Problem List   Diagnosis Date Noted  . Respiratory failure with hypoxia ([place]) 11/02/2015  . Depression 11/02/2015  . Anxiety 11/02/2015  . Hypertension 11/02/2015  . Paroxysmal atrial fibrillation ([place]) 11/02/2015  . Hypothyroidism 11/02/2015  . Chronic venous stasis dermatitis 11/02/2015  . Chronic pain 11/02/2015  . Breast cancer ([place]) 11/02/2015    Past Surgical History:  Procedure Laterality Date  . FRACTURE SURGERY    . HIP FRACTURE SURGERY      OB History    No data available       Home Medications    Prior to Admission medications   Medication Sig Start Date End Date Taking? Authorizing Provider  acetaminophen (TYLENOL) 325 MG tablet Take 325 mg by mouth every 4 (four) hours as needed. For pain/headache/fever     [provider]  amiodarone (PACERONE) 200 MG tablet Take 200 mg by mouth daily.      [provider]  amLODipine (NORVASC) 10 MG tablet Take 10 mg by mouth daily.      [provider]  apixaban (ELIQUIS) 5 MG TABS tablet Take 5 mg by mouth 2 (two) times daily.    [provider]  baclofen (LIORESAL) 10 MG tablet Take 10 mg by mouth 3 (three) times daily.      [provider]  benazepril (LOTENSIN) 20 MG tablet Take 20 mg by mouth at bedtime.     [provider]  calcium carbonate (OSCAL) 1500 (600 Ca) MG TABS tablet Take 1,200 mg of elemental calcium by mouth daily with breakfast.    [provider]  calcium carbonate (TUMS - DOSED IN MG ELEMENTAL CALCIUM) 500 MG chewable tablet Chew 2 tablets by mouth as needed for indigestion or heartburn.    [provider]  carboxymethylcellulose (REFRESH PLUS) 0.5 % SOLN Place 1 drop into both eyes 4 (four) times daily as needed (dry eyes).    [provider]  cholecalciferol (VITAMIN D) 1000 units tablet Take 1,000 Units by mouth daily.    [provider]  furosemide (LASIX) 80 MG tablet Take 80 mg by mouth every morning.      [provider]  Gabapentin, PHN, (GRALISE) 600 MG TABS Take 1-2 tablets by mouth at bedtime. 2 tablets at bedtime,  1 tablet 3 times daily    [provider]  guaiFENesin (MUCINEX) 600 MG 12 hr tablet Take 600 mg by mouth 2 (two) times daily as needed for cough.    [provider]  levothyroxine (SYNTHROID, LEVOTHROID) 50 MCG tablet Take 50 mcg by mouth daily.      [provider]  loperamide (IMODIUM) 2 MG capsule Take 2 mg by mouth 4 (four) times daily as needed. For loose stools/diarrhea     [provider]  menthol-zinc oxide (GOLD BOND) powder Apply 1 application topically daily.    [provider]  Multiple Vitamins-Minerals (MULTIVITAMINS THER. W/MINERALS) TABS Take 1 tablet by mouth daily.      [provider]  OXYQUINOLONE SULFATE VAGINAL (TRIMO-SAN) 0.025 % GEL Place 1 application  vaginally at bedtime. On Tuesday and friday    [provider]  polyethylene glycol (MIRALAX / GLYCOLAX) packet Take 17 g by mouth daily as needed. For constipation.     [provider]  potassium chloride SA (K-DUR,KLOR-CON) 20 MEQ tablet Take 20 mEq by mouth daily.      [provider]  tamoxifen (NOLVADEX) 10 MG tablet Take 10 mg by mouth daily.      [provider]  tolnaftate (TINACTIN) 1 % powder Apply 1 application topically as needed for itching.    [provider]  traMADol (ULTRAM) 50 MG tablet Take 50 mg by mouth every 6 (six) hours as needed for moderate pain.    [provider]  venlafaxine (EFFEXOR-XR) 150 MG 24 hr capsule Take 150 mg by mouth daily.      [provider]    Family History History reviewed. No pertinent family history.  Social History Social History  Substance Use Topics  . Smoking status: Never Smoker  . Smokeless tobacco: Never Used  . Alcohol use No     Allergies   Sulfa antibiotics   Review of Systems Review of Systems  Constitutional: Negative for appetite change, chills, diaphoresis, fatigue and fever.  HENT: Negative for mouth sores, sore throat and trouble swallowing.   Eyes: Negative for visual disturbance.  Respiratory: Negative for cough, chest tightness, shortness of breath and wheezing.   Cardiovascular: Negative for chest pain.  Gastrointestinal: Negative for abdominal distention, abdominal pain, diarrhea, nausea and vomiting.  Endocrine: Negative for polydipsia, polyphagia and polyuria.  Genitourinary: Negative for dysuria, frequency and hematuria.  Musculoskeletal: Positive for back pain. Negative for gait problem.  Skin: Positive for color change. Negative for pallor and rash.  Neurological: Negative for dizziness, syncope, light-headedness and headaches.  Hematological: Does not bruise/bleed easily.  Psychiatric/Behavioral: Negative for behavioral problems and  confusion.     Physical Exam Updated Vital Signs BP (!) 154/70   Pulse (!) 57   Temp 98.2 F (36.8 C) (Oral)   Resp 16   SpO2 93% Comment: Pt states her SpO2 runs low  Physical Exam  Constitutional: She is oriented to person, place, and time. She appears well-developed and well-nourished. No distress.  HENT:  Head: Normocephalic.  Eyes: Conjunctivae are normal. Pupils are equal, round, and reactive to light. No scleral icterus.  Neck: Normal range of motion. Neck supple. No thyromegaly present.  Cardiovascular: Normal rate and regular rhythm.  Exam reveals no gallop and no friction rub.   No murmur heard. Pulmonary/Chest: Effort normal and breath sounds normal. No respiratory distress. She has no wheezes. She has no rales.  Abdominal: Soft. Bowel sounds are normal. She exhibits no distension.  There is no tenderness. There is no rebound.  Musculoskeletal: Normal range of motion.  Discomfort in her back is left of midline approximately L2-3. No shingles or rash. Tender to touch. Lungs are clear. Anterior abdomen soft benign. Extremities well perfused. Normal [employer] neurological exam.  Neurological: She is alert and oriented to person, place, and time.  Skin: Skin is warm and dry. No rash noted.  Darkened skin bilateral extremity left greater than right consistent with venous stasis. No asymmetry of size. No tenderness. No warmth. Capillary refill and sensation distally.  Psychiatric: She has a normal mood and affect. Her behavior is normal.     ED Treatments / Results  Labs (all labs ordered are listed, but only abnormal results are displayed) Labs Reviewed  URINALYSIS, ROUTINE W REFLEX MICROSCOPIC - Abnormal; Notable for the following:       Result Value   APPearance HAZY (*)    Leukocytes, UA SMALL (*)    Bacteria, UA RARE (*)    Squamous Epithelial / LPF 0-5 (*)    All other components within normal limits    EKG  EKG Interpretation None       Radiology Dg  Chest 1 View  Result Date: 11/21/2016 CLINICAL DATA:  Upper back pain x6 hours, history of breast cancer and AFib EXAM: CHEST 1 VIEW COMPARISON:  CTA chest dated 11/02/2015 FINDINGS: Lungs are clear.  No pleural effusion or pneumothorax. The heart is normal in size. Moderate hiatal hernia. IMPRESSION: No evidence of acute cardiopulmonary disease. Moderate hiatal hernia. Electronically Signed   By: Julian Hy M.D.   On: 11/21/2016 12:48    Procedures Procedures (including critical care time)  Medications Ordered in ED Medications  traMADol (ULTRAM) tablet 50 mg (50 mg Oral Given 11/21/16 1203)     Initial Impression / Assessment and Plan / ED Course  I have reviewed the triage vital signs and the nursing notes.  Pertinent labs & imaging results that were available during my care of the patient were reviewed by me and considered in my medical decision making (see chart for details).    Urine shows no sign of infection. Chest x-ray shows no acute findings. Encouraged her to elevate her legs. Use compression stockings. Ultram for pain. Primary care follow-up.  Final Clinical Impressions(s) / ED Diagnoses   Final diagnoses:  Chronic bilateral low back pain without sciatica    New Prescriptions New Prescriptions   No medications on file     Tanna Furry, MD 11/21/16 1457

## 2016-11-26 DIAGNOSIS — M5186 Other intervertebral disc disorders, lumbar region: Secondary | ICD-10-CM | POA: Diagnosis not present

## 2016-11-26 DIAGNOSIS — M79605 Pain in left leg: Secondary | ICD-10-CM | POA: Diagnosis not present

## 2016-11-26 DIAGNOSIS — M545 Low back pain: Secondary | ICD-10-CM | POA: Diagnosis not present

## 2016-11-26 DIAGNOSIS — M79672 Pain in left foot: Secondary | ICD-10-CM | POA: Diagnosis not present

## 2016-11-26 DIAGNOSIS — G894 Chronic pain syndrome: Secondary | ICD-10-CM | POA: Diagnosis not present

## 2016-11-26 DIAGNOSIS — M25561 Pain in right knee: Secondary | ICD-10-CM | POA: Diagnosis not present

## 2016-11-26 DIAGNOSIS — M25519 Pain in unspecified shoulder: Secondary | ICD-10-CM | POA: Diagnosis not present

## 2016-11-26 DIAGNOSIS — M79629 Pain in unspecified upper arm: Secondary | ICD-10-CM | POA: Diagnosis not present

## 2016-11-27 DIAGNOSIS — F339 Major depressive disorder, recurrent, unspecified: Secondary | ICD-10-CM | POA: Diagnosis not present

## 2016-12-04 DIAGNOSIS — F339 Major depressive disorder, recurrent, unspecified: Secondary | ICD-10-CM | POA: Diagnosis not present

## 2016-12-06 DIAGNOSIS — F339 Major depressive disorder, recurrent, unspecified: Secondary | ICD-10-CM | POA: Diagnosis not present

## 2016-12-11 DIAGNOSIS — F339 Major depressive disorder, recurrent, unspecified: Secondary | ICD-10-CM | POA: Diagnosis not present

## 2016-12-18 DIAGNOSIS — F339 Major depressive disorder, recurrent, unspecified: Secondary | ICD-10-CM | POA: Diagnosis not present

## 2016-12-24 DIAGNOSIS — M545 Low back pain: Secondary | ICD-10-CM | POA: Diagnosis not present

## 2016-12-24 DIAGNOSIS — G894 Chronic pain syndrome: Secondary | ICD-10-CM | POA: Diagnosis not present

## 2016-12-25 DIAGNOSIS — F339 Major depressive disorder, recurrent, unspecified: Secondary | ICD-10-CM | POA: Diagnosis not present

## 2017-01-01 DIAGNOSIS — F339 Major depressive disorder, recurrent, unspecified: Secondary | ICD-10-CM | POA: Diagnosis not present

## 2017-01-03 DIAGNOSIS — Z0000 Encounter for general adult medical examination without abnormal findings: Secondary | ICD-10-CM | POA: Diagnosis not present

## 2017-01-03 DIAGNOSIS — M545 Low back pain: Secondary | ICD-10-CM | POA: Diagnosis not present

## 2017-01-03 DIAGNOSIS — I890 Lymphedema, not elsewhere classified: Secondary | ICD-10-CM | POA: Diagnosis not present

## 2017-01-03 DIAGNOSIS — M797 Fibromyalgia: Secondary | ICD-10-CM | POA: Diagnosis not present

## 2017-01-03 DIAGNOSIS — I495 Sick sinus syndrome: Secondary | ICD-10-CM | POA: Diagnosis not present

## 2017-01-03 DIAGNOSIS — K219 Gastro-esophageal reflux disease without esophagitis: Secondary | ICD-10-CM | POA: Diagnosis not present

## 2017-01-03 DIAGNOSIS — I119 Hypertensive heart disease without heart failure: Secondary | ICD-10-CM | POA: Diagnosis not present

## 2017-01-03 DIAGNOSIS — F329 Major depressive disorder, single episode, unspecified: Secondary | ICD-10-CM | POA: Diagnosis not present

## 2017-01-03 DIAGNOSIS — E782 Mixed hyperlipidemia: Secondary | ICD-10-CM | POA: Diagnosis not present

## 2017-01-03 DIAGNOSIS — I4891 Unspecified atrial fibrillation: Secondary | ICD-10-CM | POA: Diagnosis not present

## 2017-01-03 DIAGNOSIS — E039 Hypothyroidism, unspecified: Secondary | ICD-10-CM | POA: Diagnosis not present

## 2017-01-03 DIAGNOSIS — M810 Age-related osteoporosis without current pathological fracture: Secondary | ICD-10-CM | POA: Diagnosis not present

## 2017-01-08 DIAGNOSIS — F339 Major depressive disorder, recurrent, unspecified: Secondary | ICD-10-CM | POA: Diagnosis not present

## 2017-01-15 DIAGNOSIS — F339 Major depressive disorder, recurrent, unspecified: Secondary | ICD-10-CM | POA: Diagnosis not present

## 2017-01-15 DIAGNOSIS — I739 Peripheral vascular disease, unspecified: Secondary | ICD-10-CM | POA: Diagnosis not present

## 2017-01-15 DIAGNOSIS — L603 Nail dystrophy: Secondary | ICD-10-CM | POA: Diagnosis not present

## 2017-01-21 DIAGNOSIS — N812 Incomplete uterovaginal prolapse: Secondary | ICD-10-CM | POA: Diagnosis not present

## 2017-01-22 DIAGNOSIS — F339 Major depressive disorder, recurrent, unspecified: Secondary | ICD-10-CM | POA: Diagnosis not present

## 2017-02-12 DIAGNOSIS — R339 Retention of urine, unspecified: Secondary | ICD-10-CM | POA: Diagnosis not present

## 2017-02-12 DIAGNOSIS — F339 Major depressive disorder, recurrent, unspecified: Secondary | ICD-10-CM | POA: Diagnosis not present

## 2017-02-12 DIAGNOSIS — Z01419 Encounter for gynecological examination (general) (routine) without abnormal findings: Secondary | ICD-10-CM | POA: Diagnosis not present

## 2017-02-12 DIAGNOSIS — N812 Incomplete uterovaginal prolapse: Secondary | ICD-10-CM | POA: Diagnosis not present

## 2017-02-14 DIAGNOSIS — R3914 Feeling of incomplete bladder emptying: Secondary | ICD-10-CM | POA: Diagnosis not present

## 2017-02-14 DIAGNOSIS — R339 Retention of urine, unspecified: Secondary | ICD-10-CM | POA: Diagnosis not present

## 2017-02-26 DIAGNOSIS — F339 Major depressive disorder, recurrent, unspecified: Secondary | ICD-10-CM | POA: Diagnosis not present

## 2017-03-05 DIAGNOSIS — F339 Major depressive disorder, recurrent, unspecified: Secondary | ICD-10-CM | POA: Diagnosis not present

## 2017-03-07 DIAGNOSIS — F339 Major depressive disorder, recurrent, unspecified: Secondary | ICD-10-CM | POA: Diagnosis not present

## 2017-03-08 DIAGNOSIS — I10 Essential (primary) hypertension: Secondary | ICD-10-CM | POA: Diagnosis not present

## 2017-03-08 DIAGNOSIS — F331 Major depressive disorder, recurrent, moderate: Secondary | ICD-10-CM | POA: Diagnosis not present

## 2017-03-12 DIAGNOSIS — F339 Major depressive disorder, recurrent, unspecified: Secondary | ICD-10-CM | POA: Diagnosis not present

## 2017-03-19 DIAGNOSIS — F339 Major depressive disorder, recurrent, unspecified: Secondary | ICD-10-CM | POA: Diagnosis not present

## 2017-03-26 DIAGNOSIS — F339 Major depressive disorder, recurrent, unspecified: Secondary | ICD-10-CM | POA: Diagnosis not present

## 2017-03-27 ENCOUNTER — Emergency Department (HOSPITAL_COMMUNITY): Payer: Medicare Other

## 2017-03-27 ENCOUNTER — Inpatient Hospital Stay (HOSPITAL_COMMUNITY)
Admission: EM | Admit: 2017-03-27 | Discharge: 2017-03-29 | DRG: 871 | Disposition: A | Discharge status: Nursing Home (Includes ICF) | Payer: Medicare Other | Attending: Internal Medicine | Admitting: Internal Medicine

## 2017-03-27 ENCOUNTER — Encounter (HOSPITAL_COMMUNITY): Payer: Self-pay | Admitting: Emergency Medicine

## 2017-03-27 DIAGNOSIS — I110 Hypertensive heart disease with heart failure: Secondary | ICD-10-CM | POA: Diagnosis present

## 2017-03-27 DIAGNOSIS — E039 Hypothyroidism, unspecified: Secondary | ICD-10-CM | POA: Diagnosis not present

## 2017-03-27 DIAGNOSIS — R0902 Hypoxemia: Secondary | ICD-10-CM | POA: Diagnosis not present

## 2017-03-27 DIAGNOSIS — R531 Weakness: Secondary | ICD-10-CM | POA: Diagnosis not present

## 2017-03-27 DIAGNOSIS — E876 Hypokalemia: Secondary | ICD-10-CM | POA: Diagnosis present

## 2017-03-27 DIAGNOSIS — J189 Pneumonia, unspecified organism: Secondary | ICD-10-CM | POA: Diagnosis present

## 2017-03-27 DIAGNOSIS — I10 Essential (primary) hypertension: Secondary | ICD-10-CM | POA: Diagnosis not present

## 2017-03-27 DIAGNOSIS — M79671 Pain in right foot: Secondary | ICD-10-CM | POA: Diagnosis not present

## 2017-03-27 DIAGNOSIS — I5032 Chronic diastolic (congestive) heart failure: Secondary | ICD-10-CM | POA: Diagnosis present

## 2017-03-27 DIAGNOSIS — J181 Lobar pneumonia, unspecified organism: Secondary | ICD-10-CM | POA: Diagnosis not present

## 2017-03-27 DIAGNOSIS — E785 Hyperlipidemia, unspecified: Secondary | ICD-10-CM | POA: Diagnosis present

## 2017-03-27 DIAGNOSIS — Z79810 Long term (current) use of selective estrogen receptor modulators (SERMs): Secondary | ICD-10-CM

## 2017-03-27 DIAGNOSIS — Z7901 Long term (current) use of anticoagulants: Secondary | ICD-10-CM | POA: Diagnosis not present

## 2017-03-27 DIAGNOSIS — M25511 Pain in right shoulder: Secondary | ICD-10-CM | POA: Diagnosis not present

## 2017-03-27 DIAGNOSIS — C50919 Malignant neoplasm of unspecified site of unspecified female breast: Secondary | ICD-10-CM | POA: Diagnosis not present

## 2017-03-27 DIAGNOSIS — M545 Low back pain: Secondary | ICD-10-CM | POA: Diagnosis not present

## 2017-03-27 DIAGNOSIS — Z853 Personal history of malignant neoplasm of breast: Secondary | ICD-10-CM | POA: Diagnosis not present

## 2017-03-27 DIAGNOSIS — M25562 Pain in left knee: Secondary | ICD-10-CM | POA: Diagnosis not present

## 2017-03-27 DIAGNOSIS — G894 Chronic pain syndrome: Secondary | ICD-10-CM | POA: Diagnosis not present

## 2017-03-27 DIAGNOSIS — M79672 Pain in left foot: Secondary | ICD-10-CM | POA: Diagnosis not present

## 2017-03-27 DIAGNOSIS — B9620 Unspecified Escherichia coli [E. coli] as the cause of diseases classified elsewhere: Secondary | ICD-10-CM | POA: Diagnosis present

## 2017-03-27 DIAGNOSIS — F329 Major depressive disorder, single episode, unspecified: Secondary | ICD-10-CM | POA: Diagnosis present

## 2017-03-27 DIAGNOSIS — N390 Urinary tract infection, site not specified: Secondary | ICD-10-CM | POA: Diagnosis not present

## 2017-03-27 DIAGNOSIS — Z8249 Family history of ischemic heart disease and other diseases of the circulatory system: Secondary | ICD-10-CM | POA: Diagnosis not present

## 2017-03-27 DIAGNOSIS — M797 Fibromyalgia: Secondary | ICD-10-CM | POA: Diagnosis present

## 2017-03-27 DIAGNOSIS — A419 Sepsis, unspecified organism: Principal | ICD-10-CM | POA: Diagnosis present

## 2017-03-27 DIAGNOSIS — R509 Fever, unspecified: Secondary | ICD-10-CM | POA: Diagnosis not present

## 2017-03-27 DIAGNOSIS — Z79890 Hormone replacement therapy (postmenopausal): Secondary | ICD-10-CM

## 2017-03-27 DIAGNOSIS — I480 Paroxysmal atrial fibrillation: Secondary | ICD-10-CM | POA: Diagnosis present

## 2017-03-27 DIAGNOSIS — Y95 Nosocomial condition: Secondary | ICD-10-CM | POA: Diagnosis present

## 2017-03-27 DIAGNOSIS — Z66 Do not resuscitate: Secondary | ICD-10-CM | POA: Diagnosis present

## 2017-03-27 DIAGNOSIS — M25512 Pain in left shoulder: Secondary | ICD-10-CM | POA: Diagnosis not present

## 2017-03-27 DIAGNOSIS — M25561 Pain in right knee: Secondary | ICD-10-CM | POA: Diagnosis not present

## 2017-03-27 LAB — CBC WITH DIFFERENTIAL/PLATELET
Basophils Absolute: 0 10*3/uL (ref 0.0–0.1)
Basophils Relative: 0 %
EOS ABS: 0 10*3/uL (ref 0.0–0.7)
Eosinophils Relative: 0 %
HCT: 36.3 % (ref 36.0–46.0)
HEMOGLOBIN: 11.5 g/dL — AB (ref 12.0–15.0)
LYMPHS ABS: 0.6 10*3/uL — AB (ref 0.7–4.0)
Lymphocytes Relative: 3 %
MCH: 28.8 pg (ref 26.0–34.0)
MCHC: 31.7 g/dL (ref 30.0–36.0)
MCV: 91 fL (ref 78.0–100.0)
MONO ABS: 1.2 10*3/uL — AB (ref 0.1–1.0)
MONOS PCT: 7 %
NEUTROS PCT: 90 %
Neutro Abs: 15.6 10*3/uL — ABNORMAL HIGH (ref 1.7–7.7)
Platelets: 172 10*3/uL (ref 150–400)
RBC: 3.99 MIL/uL (ref 3.87–5.11)
RDW: 14.9 % (ref 11.5–15.5)
WBC: 17.4 10*3/uL — ABNORMAL HIGH (ref 4.0–10.5)

## 2017-03-27 LAB — COMPREHENSIVE METABOLIC PANEL
ALK PHOS: 43 U/L (ref 38–126)
ALT: 13 U/L — ABNORMAL LOW (ref 14–54)
ANION GAP: 7 (ref 5–15)
AST: 15 U/L (ref 15–41)
Albumin: 3.2 g/dL — ABNORMAL LOW (ref 3.5–5.0)
BILIRUBIN TOTAL: 0.7 mg/dL (ref 0.3–1.2)
BUN: 13 mg/dL (ref 6–20)
CALCIUM: 8.2 mg/dL — AB (ref 8.9–10.3)
CO2: 30 mmol/L (ref 22–32)
Chloride: 102 mmol/L (ref 101–111)
Creatinine, Ser: 0.74 mg/dL (ref 0.44–1.00)
GFR calc non Af Amer: >60 mL/min (ref >60)
Glucose, Bld: 155 mg/dL — ABNORMAL HIGH (ref 65–99)
POTASSIUM: 3.3 mmol/L — AB (ref 3.5–5.1)
SODIUM: 139 mmol/L (ref 135–145)
TOTAL PROTEIN: 6 g/dL — AB (ref 6.5–8.1)

## 2017-03-27 LAB — I-STAT CG4 LACTIC ACID, ED: Lactic Acid, Venous: 1.06 mmol/L (ref 0.5–1.9)

## 2017-03-27 MED ORDER — APIXABAN 5 MG PO TABS
5.0000 mg | ORAL_TABLET | Freq: Two times a day (BID) | ORAL | Status: DC
  Administered 2017-03-28 – 2017-03-29 (×4): 5 mg via ORAL
  Filled 2017-03-27 (×4): qty 1

## 2017-03-27 MED ORDER — TAMOXIFEN CITRATE 10 MG PO TABS
10.0000 mg | ORAL_TABLET | Freq: Every day | ORAL | Status: DC
  Administered 2017-03-28 – 2017-03-29 (×2): 10 mg via ORAL
  Filled 2017-03-27 (×2): qty 1

## 2017-03-27 MED ORDER — FUROSEMIDE 40 MG PO TABS
80.0000 mg | ORAL_TABLET | Freq: Every day | ORAL | Status: DC
  Administered 2017-03-28 – 2017-03-29 (×2): 80 mg via ORAL
  Filled 2017-03-27 (×2): qty 2

## 2017-03-27 MED ORDER — CALCIUM CARBONATE ANTACID 500 MG PO CHEW: 2.0000 | CHEWABLE_TABLET | ORAL | Status: DC | PRN

## 2017-03-27 MED ORDER — ACETAMINOPHEN 650 MG RE SUPP: 650.0000 mg | Freq: Four times a day (QID) | RECTAL | Status: DC | PRN

## 2017-03-27 MED ORDER — VANCOMYCIN HCL IN DEXTROSE 1-5 GM/200ML-% IV SOLN
1000.0000 mg | Freq: Once | INTRAVENOUS | Status: AC
  Administered 2017-03-28: 1000 mg via INTRAVENOUS
  Filled 2017-03-27: qty 200

## 2017-03-27 MED ORDER — POLYETHYLENE GLYCOL 3350 17 G PO PACK: 17.0000 g | PACK | Freq: Every day | ORAL | Status: DC | PRN

## 2017-03-27 MED ORDER — ONDANSETRON HCL 4 MG/2ML IJ SOLN: 4.0000 mg | Freq: Four times a day (QID) | INTRAMUSCULAR | Status: DC | PRN

## 2017-03-27 MED ORDER — VITAMIN D3 25 MCG (1000 UNIT) PO TABS
1000.0000 [IU] | ORAL_TABLET | Freq: Every day | ORAL | Status: DC
  Administered 2017-03-28 – 2017-03-29 (×2): 1000 [IU] via ORAL
  Filled 2017-03-27 (×2): qty 1

## 2017-03-27 MED ORDER — AMIODARONE HCL 200 MG PO TABS
200.0000 mg | ORAL_TABLET | Freq: Every day | ORAL | Status: DC
  Administered 2017-03-28: 200 mg via ORAL
  Filled 2017-03-27 (×2): qty 1

## 2017-03-27 MED ORDER — POLYVINYL ALCOHOL 1.4 % OP SOLN
1.0000 [drp] | Freq: Four times a day (QID) | OPHTHALMIC | Status: DC | PRN
  Administered 2017-03-28 (×2): 1 [drp] via OPHTHALMIC
  Filled 2017-03-27: qty 15

## 2017-03-27 MED ORDER — BENAZEPRIL HCL 20 MG PO TABS
20.0000 mg | ORAL_TABLET | Freq: Every day | ORAL | Status: DC
  Administered 2017-03-28 – 2017-03-29 (×2): 20 mg via ORAL
  Filled 2017-03-27: qty 2
  Filled 2017-03-27: qty 1
  Filled 2017-03-27: qty 2
  Filled 2017-03-27: qty 1

## 2017-03-27 MED ORDER — POTASSIUM CHLORIDE CRYS ER 20 MEQ PO TBCR
20.0000 meq | EXTENDED_RELEASE_TABLET | Freq: Every day | ORAL | Status: DC
  Administered 2017-03-28 – 2017-03-29 (×2): 20 meq via ORAL
  Filled 2017-03-27 (×2): qty 1

## 2017-03-27 MED ORDER — GABAPENTIN 100 MG PO CAPS
200.0000 mg | ORAL_CAPSULE | Freq: Four times a day (QID) | ORAL | Status: DC
  Administered 2017-03-28 – 2017-03-29 (×8): 200 mg via ORAL
  Filled 2017-03-27 (×8): qty 2

## 2017-03-27 MED ORDER — ACETAMINOPHEN 325 MG PO TABS
650.0000 mg | ORAL_TABLET | Freq: Four times a day (QID) | ORAL | Status: DC | PRN
  Administered 2017-03-28 – 2017-03-29 (×3): 650 mg via ORAL
  Filled 2017-03-27 (×3): qty 2

## 2017-03-27 MED ORDER — PIPERACILLIN-TAZOBACTAM 3.375 G IVPB 30 MIN
3.3750 g | Freq: Once | INTRAVENOUS | Status: AC
  Administered 2017-03-28: 3.375 g via INTRAVENOUS
  Filled 2017-03-27: qty 50

## 2017-03-27 MED ORDER — CALCIUM CARBONATE 1250 (500 CA) MG PO TABS
1000.0000 mg | ORAL_TABLET | Freq: Every day | ORAL | Status: DC
  Administered 2017-03-28 – 2017-03-29 (×2): 1000 mg via ORAL
  Filled 2017-03-27 (×3): qty 1

## 2017-03-27 MED ORDER — AMLODIPINE BESYLATE 5 MG PO TABS
10.0000 mg | ORAL_TABLET | Freq: Every day | ORAL | Status: DC
  Administered 2017-03-28 – 2017-03-29 (×2): 10 mg via ORAL
  Filled 2017-03-27 (×2): qty 2

## 2017-03-27 MED ORDER — LEVOTHYROXINE SODIUM 50 MCG PO TABS
50.0000 ug | ORAL_TABLET | Freq: Every day | ORAL | Status: DC
  Administered 2017-03-28 – 2017-03-29 (×2): 50 ug via ORAL
  Filled 2017-03-27 (×2): qty 1

## 2017-03-27 MED ORDER — VENLAFAXINE HCL ER 75 MG PO CP24
150.0000 mg | ORAL_CAPSULE | Freq: Every day | ORAL | Status: DC
  Administered 2017-03-28 – 2017-03-29 (×2): 150 mg via ORAL
  Filled 2017-03-27 (×2): qty 2

## 2017-03-27 MED ORDER — ACETAMINOPHEN 500 MG PO TABS
1000.0000 mg | ORAL_TABLET | Freq: Once | ORAL | Status: AC
  Administered 2017-03-27: 1000 mg via ORAL
  Filled 2017-03-27: qty 2

## 2017-03-27 MED ORDER — ONDANSETRON HCL 4 MG PO TABS: 4.0000 mg | ORAL_TABLET | Freq: Four times a day (QID) | ORAL | Status: DC | PRN

## 2017-03-27 MED ORDER — GUAIFENESIN ER 600 MG PO TB12: 600.0000 mg | ORAL_TABLET | Freq: Two times a day (BID) | ORAL | Status: DC | PRN

## 2017-03-27 MED ORDER — BACLOFEN 10 MG PO TABS
10.0000 mg | ORAL_TABLET | Freq: Three times a day (TID) | ORAL | Status: DC
  Administered 2017-03-28 – 2017-03-29 (×6): 10 mg via ORAL
  Filled 2017-03-27 (×6): qty 1

## 2017-03-27 MED ORDER — TRAMADOL HCL 50 MG PO TABS
50.0000 mg | ORAL_TABLET | Freq: Four times a day (QID) | ORAL | Status: DC | PRN
  Administered 2017-03-28 – 2017-03-29 (×3): 50 mg via ORAL
  Filled 2017-03-27 (×3): qty 1

## 2017-03-27 NOTE — ED Notes (Signed)
Bed: NZ97 Expected date:  Expected time:  Means of arrival:  Comments: EMS 83 yo female fever and weakness

## 2017-03-27 NOTE — ED Provider Notes (Signed)
[facility] DEPT Provider Note   CSN: [id number] Arrival date & time: 03/27/17  2117     History   Chief Complaint Chief Complaint  Patient presents with  . Fever  . Dizziness    HPI [name] is a 83 y.o. female.  HPI 83 year old female who presents with fever and dizziness.  She has a history of breast cancer on tamoxifen, paroxysmal atrial fibrillation on Eliquis, hypertension and thyroid disease.  States that she has been feeling "foggy and heavy" in her head today.  Was sent in from [facility] for evaluation.  She was found to have fever of 100 Fahrenheit in route.  She has had mild cough not productive of sputum but denies any chest pain or shortness of breath.  Denies syncope or near syncope or vertigo symptoms.  Denies focal numbness or weakness or confusion.  Has not had abdominal pain, nausea or vomiting, diarrhea dysuria or urinary frequency. Past Medical History:  Diagnosis Date  . Anxiety   . Atrial fibrillation ([place])   . Breast CA ([place])   . Breast cancer ([place])   . Constipation   . Depression   . Disorder of bone density and structure, unspecified   . Fibromyalgia   . Hyperkalemia   . Hyperlipemia   . Hypertension   . Impaired fasting glucose   . Retention of urine, unspecified   . Scoliosis   . Thyroid disease     Patient Active Problem List   Diagnosis Date Noted  . HCAP (healthcare-associated pneumonia) 03/27/2017  . Respiratory failure with hypoxia ([place]) 11/02/2015  . Depression 11/02/2015  . Anxiety 11/02/2015  . Hypertension 11/02/2015  . Paroxysmal atrial fibrillation ([place]) 11/02/2015  . Hypothyroidism 11/02/2015  . Chronic venous stasis dermatitis 11/02/2015  . Chronic pain 11/02/2015  . Breast cancer ([place]) 11/02/2015    Past Surgical History:  Procedure Laterality Date  . FRACTURE SURGERY    . HIP FRACTURE SURGERY      OB History    No data available       Home Medications    Prior to  Admission medications   Medication Sig Start Date End Date Taking? Authorizing Provider  acetaminophen (TYLENOL) 500 MG tablet Take 500 mg 4 (four) times daily by mouth.   Yes [provider]  amiodarone (PACERONE) 200 MG tablet Take 200 mg by mouth daily.     Yes [provider]  amLODipine (NORVASC) 10 MG tablet Take 10 mg by mouth daily.     Yes [provider]  apixaban (ELIQUIS) 5 MG TABS tablet Take 5 mg by mouth 2 (two) times daily.   Yes [provider]  baclofen (LIORESAL) 10 MG tablet Take 10 mg by mouth 3 (three) times daily.     Yes [provider]  benazepril (LOTENSIN) 20 MG tablet Take 20 mg by mouth at bedtime.    Yes [provider]  calcium carbonate (OSCAL) 1500 (600 Ca) MG TABS tablet Take 1,200 mg of elemental calcium by mouth daily with breakfast.   Yes [provider]  carboxymethylcellulose (REFRESH PLUS) 0.5 % SOLN Place 1 drop into both eyes 4 (four) times daily as needed (dry eyes).   Yes [provider]  cholecalciferol (VITAMIN D) 1000 units tablet Take 1,000 Units by mouth daily.   Yes [provider]  furosemide (LASIX) 80 MG tablet Take 80 mg by mouth every morning.     Yes [provider]  gabapentin (NEURONTIN) 100  MG capsule Take 200 mg 4 (four) times daily by mouth.   Yes [provider]  levothyroxine (SYNTHROID, LEVOTHROID) 50 MCG tablet Take 50 mcg by mouth daily.     Yes [provider]  loperamide (IMODIUM) 2 MG capsule Take 2 mg by mouth 4 (four) times daily as needed. For loose stools/diarrhea    Yes [provider]  menthol-zinc oxide (GOLD BOND) powder Apply 1 application topically daily.   Yes [provider]  Multiple Vitamins-Minerals (MULTIVITAMINS THER. W/MINERALS) TABS Take 1 tablet by mouth daily.     Yes [provider]  OXYQUINOLONE SULFATE VAGINAL (TRIMO-SAN) 0.025 % GEL Place 1 application vaginally at bedtime.  On Tuesday and friday   Yes [provider]  potassium chloride SA (K-DUR,KLOR-CON) 20 MEQ tablet Take 20 mEq by mouth daily.     Yes [provider]  Skin Protectants, Misc. (EUCERIN) cream Apply 1 application as needed topically for dry skin.   Yes [provider]  tamoxifen (NOLVADEX) 10 MG tablet Take 10 mg by mouth daily.     Yes [provider]  traMADol (ULTRAM) 50 MG tablet Take 50 mg by mouth every 6 (six) hours as needed for moderate pain.   Yes [provider]  venlafaxine (EFFEXOR-XR) 150 MG 24 hr capsule Take 150 mg by mouth daily.     Yes [provider]  acetaminophen (TYLENOL) 325 MG tablet Take 325 mg by mouth every 4 (four) hours as needed. For pain/headache/fever     [provider]  calcium carbonate (TUMS - DOSED IN MG ELEMENTAL CALCIUM) 500 MG chewable tablet Chew 2 tablets by mouth as needed for indigestion or heartburn.    [provider]  Gabapentin, PHN, (GRALISE) 600 MG TABS Take 1-2 tablets by mouth at bedtime. 2 tablets at bedtime, 1 tablet 3 times daily    [provider]  guaiFENesin (MUCINEX) 600 MG 12 hr tablet Take 600 mg by mouth 2 (two) times daily as needed for cough.    [provider]  polyethylene glycol (MIRALAX / GLYCOLAX) packet Take 17 g by mouth daily as needed. For constipation.     [provider]  tolnaftate (TINACTIN) 1 % powder Apply 1 application topically as needed for itching.    [provider]    Family History No family history on file.  Social History Social History   Tobacco Use  . Smoking status: Never Smoker  . Smokeless tobacco: Never Used  Substance Use Topics  . Alcohol use: No  . Drug use: No     Allergies   Sulfa antibiotics   Review of Systems Review of Systems  Constitutional: Positive for fever.  Respiratory: Positive for cough. Negative for shortness of breath.   Cardiovascular: Negative for chest pain.    Gastrointestinal: Negative for abdominal pain, nausea and vomiting.  Genitourinary: Negative for dysuria.  All other systems reviewed and are negative.    Physical Exam Updated Vital Signs BP (!) 116/54   Pulse 66   Temp (!) 101.9 F (38.8 C) (Rectal)   Resp 18   Ht 5' (1.524 m)   Wt 79.4 kg (175 lb)   SpO2 91%   BMI 34.18 kg/m   Physical Exam Physical Exam  Nursing note and vitals reviewed. Constitutional:  non-toxic, and in no acute distress Head: Normocephalic and atraumatic.  Mouth/Throat: Oropharynx is clear and moist.  Neck: Normal range of motion. Neck supple.  Cardiovascular: Normal rate and regular rhythm.  Pulmonary/Chest: Effort normal and breath sounds normal.  Abdominal: Soft. There is no tenderness. There is no rebound and no guarding.  Musculoskeletal: +2 pitting bilateral lower extremity edema  Neurological: Alert, no facial droop, fluent speech, moves all extremities symmetrically Skin: Skin is warm and dry.  Psychiatric: Cooperative   ED Treatments / Results  Labs (all labs ordered are listed, but only abnormal results are displayed) Labs Reviewed  CBC WITH DIFFERENTIAL/PLATELET - Abnormal; Notable for the following components:      Result Value   WBC 17.4 (*)    Hemoglobin 11.5 (*)    Neutro Abs 15.6 (*)    Lymphs Abs 0.6 (*)    Monocytes Absolute 1.2 (*)    All other components within normal limits  COMPREHENSIVE METABOLIC PANEL - Abnormal; Notable for the following components:   Potassium 3.3 (*)    Glucose, Bld 155 (*)    Calcium 8.2 (*)    Total Protein 6.0 (*)    Albumin 3.2 (*)    ALT 13 (*)    All other components within normal limits  URINE CULTURE  URINALYSIS, ROUTINE W REFLEX MICROSCOPIC  INFLUENZA PANEL BY PCR (TYPE A & B)  I-STAT CG4 LACTIC ACID, ED    EKG  EKG Interpretation  Date/Time:  Wednesday March 27 2017 23:02:31 EST Ventricular Rate:  61 PR Interval:    QRS Duration: 90 QT Interval:  465 QTC  Calculation: 469 R Axis:   36 Text Interpretation:  Junctional rhythm Anteroseptal infarct, old Sinus bradycardia similar to previous EKG Confirmed by Brantley Stage [phone]) on 03/27/2017 11:31:53 PM       Radiology Dg Chest 2 View  Result Date: 03/27/2017 CLINICAL DATA:  Hypoxia and fever EXAM: CHEST  2 VIEW COMPARISON:  11/21/2016 FINDINGS: Mild cardiomegaly. Aortic atherosclerosis. Streaky atelectasis or infiltrate at the left lung base. No pneumothorax. IMPRESSION: Cardiomegaly.  Streaky atelectasis or infiltrate at the left base Electronically Signed   By: Donavan Foil M.D.   On: 03/27/2017 22:54    Procedures Procedures (including critical care time)  Medications Ordered in ED Medications  vancomycin (VANCOCIN) IVPB 1000 mg/200 mL premix (not administered)  piperacillin-tazobactam (ZOSYN) IVPB 3.375 g (not administered)  acetaminophen (TYLENOL) tablet 1,000 mg (1,000 mg Oral Given 03/27/17 2221)     Initial Impression / Assessment and Plan / ED Course  I have reviewed the triage vital signs and the nursing notes.  Pertinent labs & imaging results that were available during my care of the patient were reviewed by me and considered in my medical decision making (see chart for details).     83 year old female who presents with dizziness and fever.  Her dizziness is described as a foggy or heaviness in her head, she denies it being consistent with lightheadedness or vertigo.  No neurological symptoms or headache.  She does have a fever of 101.9 but otherwise is hemodynamically stable.  She has hypoxia on room air to 86%, and has a new 2 L oxygen requirement.  Presentation is concerning for pneumonia.  She has evidence of infiltrate in the left lower lobe and a leukocytosis of 17.  No significant lactic acidosis.  Is covered with vancomycin and Zosyn for healthcare associated pneumonia. Discussed with Dr. Hal Hope who will admit for ongoing care  Final Clinical Impressions(s) / ED  Diagnoses   Final diagnoses:  HCAP (healthcare-associated pneumonia)  Lobar pneumonia [facility])    ED Discharge Orders    None       Oleta Mouse,  Eugenia Mcalpine, MD 03/27/17 [phone]

## 2017-03-27 NOTE — ED Notes (Signed)
Pt has been on bedpan but did urinate.

## 2017-03-27 NOTE — H&P (Addendum)
History and Physical    [name] WCH:[id number] DOB: 08-May-1931 DOA: 03/27/2017  PCP: [name], MD  Patient coming from: Nursing facility.  Chief Complaint: Fever and dizziness.  HPI: [name] is a 83 y.o. female with history of atrial fibrillation, hypertension, breast cancer in remission, on Lasix was brought to the ER because of patient feeling weak dizzy and was found to be febrile.  Patient had complained to the ER for several cough.  Patient otherwise denies any chest pain shortness of breath nausea vomiting or diarrhea.  ED Course: In the ER patient is found to have a fever of 101 F.  Chest x-ray shows possible infiltrates.  Given the generalized weakness and possible pneumonia patient admitted for IV antibiotics.  Review of Systems: As per HPI, rest all negative.   Past Medical History:  Diagnosis Date  . Anxiety   . Atrial fibrillation ([place])   . Breast CA ([place])   . Breast cancer ([place])   . Constipation   . Depression   . Disorder of bone density and structure, unspecified   . Fibromyalgia   . Hyperkalemia   . Hyperlipemia   . Hypertension   . Impaired fasting glucose   . Retention of urine, unspecified   . Scoliosis   . Thyroid disease     Past Surgical History:  Procedure Laterality Date  . FRACTURE SURGERY    . HIP FRACTURE SURGERY       reports that  has never smoked. she has never used smokeless tobacco. She reports that she does not drink alcohol or use drugs.  Allergies  Allergen Reactions  . Sulfa Antibiotics Other (See Comments)    Per mar    Family History  Problem Relation Age of Onset  . Hypertension Other     Prior to Admission medications   Medication Sig Start Date End Date Taking? Authorizing Provider  acetaminophen (TYLENOL) 500 MG tablet Take 500 mg 4 (four) times daily by mouth.   Yes [provider]  amiodarone (PACERONE) 200 MG tablet Take 200 mg by mouth daily.     Yes [provider]    amLODipine (NORVASC) 10 MG tablet Take 10 mg by mouth daily.     Yes [provider]  apixaban (ELIQUIS) 5 MG TABS tablet Take 5 mg by mouth 2 (two) times daily.   Yes [provider]  baclofen (LIORESAL) 10 MG tablet Take 10 mg by mouth 3 (three) times daily.     Yes [provider]  benazepril (LOTENSIN) 20 MG tablet Take 20 mg by mouth at bedtime.    Yes [provider]  calcium carbonate (OSCAL) 1500 (600 Ca) MG TABS tablet Take 1,200 mg of elemental calcium by mouth daily with breakfast.   Yes [provider]  carboxymethylcellulose (REFRESH PLUS) 0.5 % SOLN Place 1 drop into both eyes 4 (four) times daily as needed (dry eyes).   Yes [provider]  cholecalciferol (VITAMIN D) 1000 units tablet Take 1,000 Units by mouth daily.   Yes [provider]  furosemide (LASIX) 80 MG tablet Take 80 mg by mouth every morning.     Yes [provider]  gabapentin (NEURONTIN) 100 MG capsule Take 200 mg 4 (four) times daily by mouth.   Yes [provider]  levothyroxine (SYNTHROID, LEVOTHROID) 50 MCG tablet Take 50 mcg by mouth daily.     Yes [provider]  loperamide (IMODIUM) 2 MG capsule Take 2 mg  by mouth 4 (four) times daily as needed. For loose stools/diarrhea    Yes [provider]  menthol-zinc oxide (GOLD BOND) powder Apply 1 application topically daily.   Yes [provider]  Multiple Vitamins-Minerals (MULTIVITAMINS THER. W/MINERALS) TABS Take 1 tablet by mouth daily.     Yes [provider]  OXYQUINOLONE SULFATE VAGINAL (TRIMO-SAN) 0.025 % GEL Place 1 application vaginally at bedtime. On Tuesday and friday   Yes [provider]  potassium chloride SA (K-DUR,KLOR-CON) 20 MEQ tablet Take 20 mEq by mouth daily.     Yes [provider]  Skin Protectants, Misc. (EUCERIN) cream Apply 1 application as needed topically for dry skin.   Yes [provider]   tamoxifen (NOLVADEX) 10 MG tablet Take 10 mg by mouth daily.     Yes [provider]  traMADol (ULTRAM) 50 MG tablet Take 50 mg by mouth every 6 (six) hours as needed for moderate pain.   Yes [provider]  venlafaxine (EFFEXOR-XR) 150 MG 24 hr capsule Take 150 mg by mouth daily.     Yes [provider]  acetaminophen (TYLENOL) 325 MG tablet Take 325 mg by mouth every 4 (four) hours as needed. For pain/headache/fever     [provider]  calcium carbonate (TUMS - DOSED IN MG ELEMENTAL CALCIUM) 500 MG chewable tablet Chew 2 tablets by mouth as needed for indigestion or heartburn.    [provider]  Gabapentin, PHN, (GRALISE) 600 MG TABS Take 1-2 tablets by mouth at bedtime. 2 tablets at bedtime, 1 tablet 3 times daily    [provider]  guaiFENesin (MUCINEX) 600 MG 12 hr tablet Take 600 mg by mouth 2 (two) times daily as needed for cough.    [provider]  polyethylene glycol (MIRALAX / GLYCOLAX) packet Take 17 g by mouth daily as needed. For constipation.     [provider]  tolnaftate (TINACTIN) 1 % powder Apply 1 application topically as needed for itching.    [provider]    Physical Exam: Vitals:   03/27/17 2131 03/27/17 2132 03/27/17 2134  BP: (!) 116/54    Pulse: 66    Resp: 18    Temp:   (!) 101.9 F (38.8 C)  TempSrc:   Rectal  SpO2: (!) 86% 91%   Weight:  79.4 kg (175 lb)   Height:  5' (1.524 m)       Constitutional: Moderately built and nourished. Vitals:   03/27/17 2131 03/27/17 2132 03/27/17 2134  BP: (!) 116/54    Pulse: 66    Resp: 18    Temp:   (!) 101.9 F (38.8 C)  TempSrc:   Rectal  SpO2: (!) 86% 91%   Weight:  79.4 kg (175 lb)   Height:  5' (1.524 m)    Eyes: Anicteric no pallor. ENMT: No discharge from the ears eyes nose or mouth. Neck: No neck rigidity no mass felt. Respiratory: No rhonchi or crepitations. Cardiovascular: S1-S2 heard no murmurs  appreciated. Abdomen: Soft nontender bowel sounds present. Musculoskeletal: Bilateral lower extremity swelling appears to be chronic. Skin: Chronic skin changes. Neurologic: Alert awake oriented to her name and place.  Moves all activities. Psychiatric: Appears to have mild dementia.   Labs on Admission: I have personally reviewed following labs and imaging studies  CBC: Recent Labs  Lab 03/27/17 2217  WBC 17.4*  NEUTROABS 15.6*  HGB 11.5*  HCT 36.3  MCV 91.0  PLT 172   Basic  Metabolic Panel: Recent Labs  Lab 03/27/17 2217  NA 139  K 3.3*  CL 102  CO2 30  GLUCOSE 155*  BUN 13  CREATININE 0.74  CALCIUM 8.2*   GFR: Estimated Creatinine Clearance: 48.8 mL/min (by C-G formula based on SCr of 0.74 mg/dL). Liver Function Tests: Recent Labs  Lab 03/27/17 2217  AST 15  ALT 13*  ALKPHOS 43  BILITOT 0.7  PROT 6.0*  ALBUMIN 3.2*   No results for input(s): LIPASE, AMYLASE in the last 168 hours. No results for input(s): AMMONIA in the last 168 hours. Coagulation Profile: No results for input(s): INR, PROTIME in the last 168 hours. Cardiac Enzymes: No results for input(s): CKTOTAL, CKMB, CKMBINDEX, TROPONINI in the last 168 hours. BNP (last 3 results) No results for input(s): PROBNP in the last 8760 hours. HbA1C: No results for input(s): HGBA1C in the last 72 hours. CBG: No results for input(s): GLUCAP in the last 168 hours. Lipid Profile: No results for input(s): CHOL, HDL, LDLCALC, TRIG, CHOLHDL, LDLDIRECT in the last 72 hours. Thyroid Function Tests: No results for input(s): TSH, T4TOTAL, FREET4, T3FREE, THYROIDAB in the last 72 hours. Anemia Panel: No results for input(s): VITAMINB12, FOLATE, FERRITIN, TIBC, IRON, RETICCTPCT in the last 72 hours. Urine analysis:    Component Value Date/Time   COLORURINE YELLOW 11/21/2016 1318   APPEARANCEUR HAZY (A) 11/21/2016 1318   LABSPEC 1.005 11/21/2016 1318   PHURINE 6.0 11/21/2016 1318   GLUCOSEU NEGATIVE  11/21/2016 1318   HGBUR NEGATIVE 11/21/2016 1318   BILIRUBINUR NEGATIVE 11/21/2016 1318   KETONESUR NEGATIVE 11/21/2016 1318   PROTEINUR NEGATIVE 11/21/2016 1318   UROBILINOGEN 1.0 03/07/2011 0044   NITRITE NEGATIVE 11/21/2016 1318   LEUKOCYTESUR SMALL (A) 11/21/2016 1318   Sepsis Labs: @LABRCNTIP (procalcitonin:4,lacticidven:4) )No results found for this or any previous visit (from the past 240 hour(s)).   Radiological Exams on Admission: Dg Chest 2 View  Result Date: 03/27/2017 CLINICAL DATA:  Hypoxia and fever EXAM: CHEST  2 VIEW COMPARISON:  11/21/2016 FINDINGS: Mild cardiomegaly. Aortic atherosclerosis. Streaky atelectasis or infiltrate at the left lung base. No pneumothorax. IMPRESSION: Cardiomegaly.  Streaky atelectasis or infiltrate at the left base Electronically Signed   By: Donavan Foil M.D.   On: 03/27/2017 22:54    EKG: Independently reviewed.  Normal sinus rhythm but EKG is read as junctional rhythm by the computer.  Assessment/Plan Principal Problem:   HCAP (healthcare-associated pneumonia) Active Problems:   Hypertension   Paroxysmal atrial fibrillation (HCC)   Hypothyroidism   Breast cancer ([place])    1. Possible healthcare associated pneumonia -patient is on vancomycin and cefepime.  Check influenza PCR.  Follow cultures. 2. Atrial fibrillation -EKG is read by the computer as junctional but cannot see clear P waves.  On amiodarone and apixaban. 3. Hypertension on benazepril and amlodipine. 4. On Lasix likely for diastolic CHF no known 2D echo results in our system. 5. Hypothyroidism on Synthroid check TSH. 6. History of breast cancer on tamoxifen.  Reviewed old charts and labs.   DVT prophylaxis: Apixaban. Code Status: DNR. Family Communication: No family at the bedside. Disposition Plan: Back to skilled nursing facility. Consults called: None. Admission status: Inpatient.   Rise Patience MD [facility] Pager [phone].  If  7PM-7AM, please contact night-coverage [email or web address] Password [email or web address]  03/27/2017, 11:56 PM

## 2017-03-27 NOTE — ED Triage Notes (Signed)
Pt comes from  [facility] via EMS with complaints of a fever with new onset of tonight with dizziness over the past hour.  Pt exhibiting no other symptoms at this time.  Pt originally tried to refuse to come.  Family aware patient is here. DNR at bedside. A&O x4.  Ambulates with a walker.  Chronic edema present in bilateral lower extremities.  Had extra strength tylenol at 1800.  Fever 100.00 in route.  Sinus in route. 100 cc given in route.

## 2017-03-28 ENCOUNTER — Other Ambulatory Visit: Payer: Self-pay

## 2017-03-28 DIAGNOSIS — C50919 Malignant neoplasm of unspecified site of unspecified female breast: Secondary | ICD-10-CM

## 2017-03-28 DIAGNOSIS — J181 Lobar pneumonia, unspecified organism: Secondary | ICD-10-CM

## 2017-03-28 DIAGNOSIS — I10 Essential (primary) hypertension: Secondary | ICD-10-CM

## 2017-03-28 DIAGNOSIS — E039 Hypothyroidism, unspecified: Secondary | ICD-10-CM

## 2017-03-28 DIAGNOSIS — I480 Paroxysmal atrial fibrillation: Secondary | ICD-10-CM

## 2017-03-28 DIAGNOSIS — N390 Urinary tract infection, site not specified: Secondary | ICD-10-CM

## 2017-03-28 DIAGNOSIS — J189 Pneumonia, unspecified organism: Secondary | ICD-10-CM

## 2017-03-28 LAB — BASIC METABOLIC PANEL
ANION GAP: 9 (ref 5–15)
BUN: 14 mg/dL (ref 6–20)
CALCIUM: 8.3 mg/dL — AB (ref 8.9–10.3)
CHLORIDE: 102 mmol/L (ref 101–111)
CO2: 28 mmol/L (ref 22–32)
Creatinine, Ser: 0.68 mg/dL (ref 0.44–1.00)
GFR calc Af Amer: >60 mL/min (ref >60)
GFR calc non Af Amer: >60 mL/min (ref >60)
GLUCOSE: 150 mg/dL — AB (ref 65–99)
Potassium: 3.5 mmol/L (ref 3.5–5.1)
SODIUM: 139 mmol/L (ref 135–145)

## 2017-03-28 LAB — URINALYSIS, ROUTINE W REFLEX MICROSCOPIC
Bilirubin Urine: NEGATIVE
GLUCOSE, UA: NEGATIVE mg/dL
Ketones, ur: NEGATIVE mg/dL
Nitrite: POSITIVE — AB
PH: 5 (ref 5.0–8.0)
Protein, ur: 100 mg/dL — AB
SPECIFIC GRAVITY, URINE: 1.017 (ref 1.005–1.030)

## 2017-03-28 LAB — CBC
HEMATOCRIT: 38.7 % (ref 36.0–46.0)
HEMOGLOBIN: 12.3 g/dL (ref 12.0–15.0)
MCH: 28.9 pg (ref 26.0–34.0)
MCHC: 31.8 g/dL (ref 30.0–36.0)
MCV: 91.1 fL (ref 78.0–100.0)
Platelets: 149 10*3/uL — ABNORMAL LOW (ref 150–400)
RBC: 4.25 MIL/uL (ref 3.87–5.11)
RDW: 14.9 % (ref 11.5–15.5)
WBC: 19.5 10*3/uL — ABNORMAL HIGH (ref 4.0–10.5)

## 2017-03-28 LAB — LACTIC ACID, PLASMA: LACTIC ACID, VENOUS: 1.4 mmol/L (ref 0.5–1.9)

## 2017-03-28 LAB — TSH: TSH: 2.469 u[IU]/mL (ref 0.350–4.500)

## 2017-03-28 LAB — INFLUENZA PANEL BY PCR (TYPE A & B)
INFLAPCR: NEGATIVE
INFLBPCR: NEGATIVE

## 2017-03-28 LAB — STREP PNEUMONIAE URINARY ANTIGEN: STREP PNEUMO URINARY ANTIGEN: NEGATIVE

## 2017-03-28 LAB — MRSA PCR SCREENING: MRSA BY PCR: NEGATIVE

## 2017-03-28 MED ORDER — VANCOMYCIN HCL IN DEXTROSE 750-5 MG/150ML-% IV SOLN
750.0000 mg | Freq: Every day | INTRAVENOUS | Status: DC
  Administered 2017-03-29: 750 mg via INTRAVENOUS
  Filled 2017-03-28: qty 150

## 2017-03-28 MED ORDER — DEXTROSE 5 % IV SOLN
1.0000 g | Freq: Two times a day (BID) | INTRAVENOUS | Status: DC
  Administered 2017-03-28 – 2017-03-29 (×3): 1 g via INTRAVENOUS
  Filled 2017-03-28 (×5): qty 1

## 2017-03-28 MED ORDER — AZITHROMYCIN 250 MG PO TABS
250.0000 mg | ORAL_TABLET | Freq: Every day | ORAL | Status: DC
  Administered 2017-03-29: 250 mg via ORAL
  Filled 2017-03-28: qty 1

## 2017-03-28 MED ORDER — AZITHROMYCIN 250 MG PO TABS
500.0000 mg | ORAL_TABLET | Freq: Every day | ORAL | Status: AC
  Administered 2017-03-28: 500 mg via ORAL
  Filled 2017-03-28: qty 2

## 2017-03-28 MED ORDER — IPRATROPIUM-ALBUTEROL 0.5-2.5 (3) MG/3ML IN SOLN: 3.0000 mL | Freq: Three times a day (TID) | RESPIRATORY_TRACT | Status: DC | PRN

## 2017-03-28 NOTE — Progress Notes (Signed)
[facility] PROGRESS NOTE  [name] TIR:[id number] DOB: 06/21/1930 DOA: 03/27/2017 PCP: [name], MD  Interim summary and HPI 83 y.o. female with history of atrial fibrillation, hypertension, breast cancer in remission, on Lasix was brought to the ER because of patient feeling weak dizzy and was found to be febrile.  Patient had complained to the ER for several days of coughing spells. Found with sepsis due to UTI and also infiltrates on x-ray.   Assessment/Plan: 1-sepsis due to HCAP and UTI -will continue IV antibiotics -follow cultures -continue supportive care and use PRN antipyretics  -lactic acid is WNL and patient hemodynamically stable -patient met sepsis criteria on admission with elevated HR, high temperature, elevated WBC's and findings on CXR and UA as source for infection. -neg influenza by PCR  2-paroxysmal A. Fib -will continue monitoring on telemetry -continue amiodarone and apixaban -rate is controlled now  3-HTN -stable -continue benazepril, amlodipine and lasix  4-chronic diastolic HF -compensated -will be judicious with IVF's -follow daily weights and strict intake and output -continue diuretics   5-hypothyroidism -will continue synthroid   6-hx of breast cancer -continue tamoxifen   7-depression -will continue effexor  8-hypokalemia -repleted -will follow electrolytes and replete further as needed   Code Status: DNR Family Communication: no family at bedside Disposition Plan: remains inpatient, continue IV antibiotics, follow clinical response and once medically stable will discharge back to SNF.   Consultants:  None   Procedures:  See below for x-ray reports   Antibiotics:  Vancomycin, cefepime and zythromax 03/27/17>>>  HPI/Subjective: Patient spiking fever overnight and complaining of chills, no CP no nausea, no vomiting, reports SOB and generalized malaise.  Objective: Vitals:   03/28/17 1217 03/28/17 1313   BP:  (!) 133/54  Pulse:  (!) 59  Resp:  16  Temp:  98.6 F (37 C)  SpO2: 94% 92%    Intake/Output Summary (Last 24 hours) at 03/28/2017 1842 Last data filed at 03/28/2017 1809 Gross per 24 hour  Intake 300 ml  Output 2000 ml  Net -1700 ml   Filed Weights   03/27/17 2132 03/28/17 0106  Weight: 79.4 kg (175 lb) 77 kg (169 lb 12.1 oz)    Exam:   General:  Fever overnight, no CP, no abd pain, no nausea or vomiting. Reporting some SOB, coughing spells and generalized malaise.  Cardiovascular: rate controlled, no rubs, no gallops, no JVD  Respiratory: positive rhonchi, no crackles, no wheezing, fair air movement.  Abdomen: soft, NT, ND, positive BS  Musculoskeletal: no cyanosis, no clubbing, positive edema 1-2 plus edema bilaterally.  Data Reviewed: Basic Metabolic Panel: Recent Labs  Lab 03/27/17 2217 03/28/17 0127  NA 139 139  K 3.3* 3.5  CL 102 102  CO2 30 28  GLUCOSE 155* 150*  BUN 13 14  CREATININE 0.74 0.68  CALCIUM 8.2* 8.3*   Liver Function Tests: Recent Labs  Lab 03/27/17 2217  AST 15  ALT 13*  ALKPHOS 43  BILITOT 0.7  PROT 6.0*  ALBUMIN 3.2*   CBC: Recent Labs  Lab 03/27/17 2217 03/28/17 0127  WBC 17.4* 19.5*  NEUTROABS 15.6*  --   HGB 11.5* 12.3  HCT 36.3 38.7  MCV 91.0 91.1  PLT 172 149*    CBG: No results for input(s): GLUCAP in the last 168 hours.  Recent Results (from the past 240 hour(s))  MRSA PCR Screening     Status: None   Collection Time: 03/28/17  1:27 AM  Result Value Ref Range  Status   MRSA by PCR NEGATIVE NEGATIVE Final    Comment:        The GeneXpert MRSA Assay (FDA approved for NASAL specimens only), is one component of a comprehensive MRSA colonization surveillance program. It is not intended to diagnose MRSA infection nor to guide or monitor treatment for MRSA infections.      Studies: Dg Chest 2 View  Result Date: 03/27/2017 CLINICAL DATA:  Hypoxia and fever EXAM: CHEST  2 VIEW COMPARISON:   11/21/2016 FINDINGS: Mild cardiomegaly. Aortic atherosclerosis. Streaky atelectasis or infiltrate at the left lung base. No pneumothorax. IMPRESSION: Cardiomegaly.  Streaky atelectasis or infiltrate at the left base Electronically Signed   By: Kim  Fujinaga M.D.   On: 03/27/2017 22:54    Scheduled Meds: . amiodarone  200 mg Oral Daily  . amLODipine  10 mg Oral Daily  . apixaban  5 mg Oral BID  . [START ON 03/29/2017] azithromycin  250 mg Oral Daily  . baclofen  10 mg Oral TID  . benazepril  20 mg Oral QHS  . calcium carbonate  1,000 mg of elemental calcium Oral Q breakfast  . cholecalciferol  1,000 Units Oral Daily  . furosemide  80 mg Oral Daily  . gabapentin  200 mg Oral QID  . levothyroxine  50 mcg Oral Q breakfast  . potassium chloride SA  20 mEq Oral Daily  . tamoxifen  10 mg Oral Daily  . venlafaxine XR  150 mg Oral Daily   Continuous Infusions: . ceFEPime (MAXIPIME) IV Stopped (03/28/17 1825)  . [START ON 03/29/2017] vancomycin       Time spent: 30 minutes       [facility] Pager [phone]. If 7PM-7AM, please contact night-coverage at [email or web address], password [email or web address] 03/28/2017, 6:42 PM  LOS: 1 day             

## 2017-03-28 NOTE — Progress Notes (Signed)
Pharmacy Antibiotic Note  [name] is a 83 y.o. female with fever and dizziness admitted on 03/27/2017 with pneumonia.  Pharmacy has been consulted for vancomycin dosing.  Plan: Vancomycin 1 Gm x1 then 750 mg IV q24h for est AUC=517 Zosyn 3.375 Gm x1 ED Cefepime 1 Gm IV q12h   Height: 5' (152.4 cm) Weight: 175 lb (79.4 kg) IBW/kg (Calculated) : 45.5  Temp (24hrs), Avg:101.9 F (38.8 C), Min:101.9 F (38.8 C), Max:101.9 F (38.8 C)  Recent Labs  Lab 03/27/17 2217 03/27/17 2228  WBC 17.4*  --   CREATININE 0.74  --   LATICACIDVEN  --  1.06    Estimated Creatinine Clearance: 48.8 mL/min (by C-G formula based on SCr of 0.74 mg/dL).    Allergies  Allergen Reactions  . Sulfa Antibiotics Other (See Comments)    Per mar    Antimicrobials this admission: 11/15 zosyn >> x1 ED 11/15 cefepime >> 11/15 vancomycin >>   Dose adjustments this admission:   Microbiology results:  BCx:   UCx:    Sputum:    MRSA PCR:   Thank you for allowing pharmacy to be a part of this patient's care.  Dorrene German 03/28/2017 12:13 AM

## 2017-03-28 NOTE — ED Notes (Signed)
Please call report to [facility] at [facility].

## 2017-03-29 LAB — BASIC METABOLIC PANEL
ANION GAP: 7 (ref 5–15)
BUN: 17 mg/dL (ref 6–20)
CALCIUM: 8.2 mg/dL — AB (ref 8.9–10.3)
CO2: 32 mmol/L (ref 22–32)
Chloride: 101 mmol/L (ref 101–111)
Creatinine, Ser: 0.66 mg/dL (ref 0.44–1.00)
GFR calc Af Amer: >60 mL/min (ref >60)
GLUCOSE: 113 mg/dL — AB (ref 65–99)
Potassium: 3.4 mmol/L — ABNORMAL LOW (ref 3.5–5.1)
Sodium: 140 mmol/L (ref 135–145)

## 2017-03-29 LAB — CBC
HEMATOCRIT: 39.9 % (ref 36.0–46.0)
Hemoglobin: 12.7 g/dL (ref 12.0–15.0)
MCH: 29.3 pg (ref 26.0–34.0)
MCHC: 31.8 g/dL (ref 30.0–36.0)
MCV: 92.1 fL (ref 78.0–100.0)
PLATELETS: 178 10*3/uL (ref 150–400)
RBC: 4.33 MIL/uL (ref 3.87–5.11)
RDW: 14.8 % (ref 11.5–15.5)
WBC: 12.9 10*3/uL — ABNORMAL HIGH (ref 4.0–10.5)

## 2017-03-29 LAB — LEGIONELLA PNEUMOPHILA SEROGP 1 UR AG: L. pneumophila Serogp 1 Ur Ag: NEGATIVE

## 2017-03-29 MED ORDER — AMIODARONE HCL 200 MG PO TABS
100.0000 mg | ORAL_TABLET | Freq: Every day | ORAL | Status: DC
  Administered 2017-03-29: 100 mg via ORAL

## 2017-03-29 MED ORDER — AMIODARONE HCL 100 MG PO TABS: 100.0000 mg | ORAL_TABLET | Freq: Every day | ORAL | 1 refills | Status: AC

## 2017-03-29 MED ORDER — AZITHROMYCIN 250 MG PO TABS: 250.0000 mg | ORAL_TABLET | Freq: Every day | ORAL | 0 refills | Status: AC

## 2017-03-29 MED ORDER — CEFUROXIME AXETIL 250 MG PO TABS: 250.0000 mg | ORAL_TABLET | Freq: Two times a day (BID) | ORAL | 0 refills | Status: AC

## 2017-03-29 NOTE — Care Management Note (Signed)
Case Management Note  Patient Details  Name: [name] MRN: [id number] Date of Birth: 1930-11-24  Subjective/Objective:  Per PT-no f/u. Patient from ALF-[facility]-CSW following for return back to [facility].MD updated.                  Action/Plan:d/c ALF.   Expected Discharge Date:                  Expected Discharge Plan:  Assisted Living / Rest Home  In-House Referral:  Clinical Social Work  Discharge planning Services  CM Consult  Post Acute Care Choice:    Choice offered to:     DME Arranged:    DME Agency:     HH Arranged:    HH Agency:     Status of Service:  Completed, signed off  If discussed at [employer], dates discussed:    Additional Comments:  Dessa Phi, RN 03/29/2017, 11:41 AM

## 2017-03-29 NOTE — Evaluation (Signed)
Physical Therapy Evaluation Patient Details Name: [name] MRN: [id number] DOB: Dec 15, 1930 Today's Date: 03/29/2017   History of Present Illness  83 y.o.femalewithhistory of atrial fibrillation, hypertension, breast cancer in remission, on Lasix was brought to the ER because of patient feeling weak dizzy and was found to be febrile. Patient had complained to the ER for several days of coughing spells. Found with sepsis due to UTI and also infiltrates on x-ray.   Clinical Impression  Pt admitted with above diagnosis. Pt currently with functional limitations due to the deficits listed below (see PT Problem List). Pt ambulated 110' with RW, SaO2 88-91% on room air walking, no loss of balance. Pt appears to be near baseline with mobility. I expect she can return to ALF without f/u PT needed.  Pt will benefit from skilled PT to increase their independence and safety with mobility to allow discharge to the venue listed below.       Follow Up Recommendations No PT follow up    Equipment Recommendations  None recommended by PT    Recommendations for Other Services       Precautions / Restrictions Precautions Precautions: Fall Precaution Comments: monitor O2 Restrictions Weight Bearing Restrictions: No      Mobility  Bed Mobility Overal bed mobility: Modified Independent             General bed mobility comments: HOB up  Transfers Overall transfer level: Needs assistance Equipment used: Rolling walker (2 wheeled) Transfers: Sit to/from Stand Sit to Stand: Supervision         General transfer comment: VCs for hand placement and for fully backing up to chair prior to sitting  Ambulation/Gait Ambulation/Gait assistance: Supervision Ambulation Distance (Feet): 110 Feet Assistive device: Rolling walker (2 wheeled) Gait Pattern/deviations: Step-through pattern;Decreased stride length     General Gait Details: SaO2 88-91% on room air walking, 93-95% on room air  at rest, distance limited by fatigue  Stairs            Wheelchair Mobility    Modified Rankin (Stroke Patients Only)       Balance Overall balance assessment: Modified Independent                                           Pertinent Vitals/Pain Pain Assessment: Faces Faces Pain Scale: Hurts little more Pain Location: L knee with walking -chronic Pain Descriptors / Indicators: Aching Pain Intervention(s): Limited activity within patient's tolerance;Monitored during session;Premedicated before session    Home Living Family/patient expects to be discharged to:: Assisted living               Home Equipment: Walker - 2 wheels      Prior Function Level of Independence: Independent with assistive device(s)         Comments: walks to dining room with RW, denies h/o falls in past 1 year, independent ADLs     Hand Dominance        Extremity/Trunk Assessment   Upper Extremity Assessment Upper Extremity Assessment: Overall WFL for tasks assessed    Lower Extremity Assessment Lower Extremity Assessment: Overall WFL for tasks assessed(edema noted BLEs, pt stated this is chronic)    Cervical / Trunk Assessment Cervical / Trunk Assessment: Normal  Communication   Communication: No difficulties  Cognition Arousal/Alertness: Awake/alert Behavior During Therapy: WFL for tasks assessed/performed Overall Cognitive Status: Within Functional Limits for  tasks assessed                                        General Comments      Exercises     Assessment/Plan    PT Assessment Patient needs continued PT services  PT Problem List Decreased mobility;Decreased activity tolerance;Pain       PT Treatment Interventions Gait training;Functional mobility training;Therapeutic activities;Therapeutic exercise    PT Goals (Current goals can be found in the Care Plan section)  Acute Rehab PT Goals Patient Stated Goal: return to  ALF PT Goal Formulation: With patient Time For Goal Achievement: 04/12/17 Potential to Achieve Goals: Good    Frequency Min 3X/week   Barriers to discharge        Co-evaluation               AM-PAC PT "6 Clicks" Daily Activity  Outcome Measure Difficulty turning over in bed (including adjusting bedclothes, sheets and blankets)?: None Difficulty moving from lying on back to sitting on the side of the bed? : A Little Difficulty sitting down on and standing up from a chair with arms (e.g., wheelchair, bedside commode, etc,.)?: A Little Help needed moving to and from a bed to chair (including a wheelchair)?: A Little Help needed walking in hospital room?: A Little Help needed climbing 3-5 steps with a railing? : A Lot 6 Click Score: 18    End of Session Equipment Utilized During Treatment: Gait belt Activity Tolerance: Patient tolerated treatment well Patient left: in chair;with call bell/phone within reach;with chair alarm set Nurse Communication: Mobility status PT Visit Diagnosis: Difficulty in walking, not elsewhere classified (R26.2)    Time: 4098-1191 PT Time Calculation (min) (ACUTE ONLY): 30 min   Charges:   PT Evaluation $PT Eval Low Complexity: 1 Low PT Treatments $Gait Training: 8-22 mins   PT G Codes:          Philomena Doheny 03/29/2017, 10:06 AM [phone]

## 2017-03-29 NOTE — Progress Notes (Signed)
Report called to [employer], Rn at the assisted living facility.  All questions answered.  Packet ready for pick up.  Information passed on to night shift that advanced will call RN when O2 is delivered.  Rn is to call for [facility] transport once o2 is delivered.

## 2017-03-29 NOTE — Progress Notes (Signed)
CSW received a call from pt's RN who stated pt is returning to [facility] on [address] and that they have not received pt's D/C summary.  CSW verified this on pt's chart and indicated pt has been accepted by and that pt has chosen facility on hub so facility can review pt's chart and then both sent and faxed via the hub the pt's D/C summary to [facility].  CSW updated RN and provided fax number D/C summary was sent to via the hub so [facility] can expect D/C summary.  Please reconsult if future social work needs arise.  CSW signing off, as social work intervention is no longer needed.  Alphonse Guild. Chrisean Kloth, LCSW, LCAS, CSI Clinical Social Worker Ph: [phone]

## 2017-03-29 NOTE — Progress Notes (Signed)
Called [place] from [facility], she stated that the oxygen was in the patients room and set up ready for her. Also let her know what meds pt has taken here. Called [facility] awaiting transport now.  Rosine Beat

## 2017-03-29 NOTE — Care Management Note (Signed)
Case Management Note  Patient Details  Name: [name] MRN: [id number] Date of Birth: 09/21/1930  Subjective/Objective:Received call from [place] for home 02. Noted patient qualified for home 02-[facility] rep Santiago Glad aware of home 30 order, & sats-[facility] will deliver home 02 tank to [facility] will contact [facility] floor nurse Katie once home 02 delivered to facility-Katie will contact [place] covering so he can call [facility] for ambulance transp to ALF-nurse Katie voiced understanding.                     Action/Plan:d/c AL[facility]/dme home 02 to facility.   Expected Discharge Date:  03/29/17               Expected Discharge Plan:  Assisted Living / Rest Home  In-House Referral:  Clinical Social Work  Discharge planning Services  CM Consult  Post Acute Care Choice:    Choice offered to:     DME Arranged:  Oxygen DME Agency:  [place]:    [place] Agency:     Status of Service:  Completed, signed off  If discussed at [employer] of Stay Meetings, dates discussed:    Additional Comments:  Dessa Phi, RN 03/29/2017, 5:04 PM

## 2017-03-29 NOTE — Discharge Summary (Addendum)
Physician Discharge Summary  [name] PFX:[id number] DOB: 10/08/1930 DOA: 03/27/2017  PCP: [name], MD  Admit date: 03/27/2017 Discharge date: 03/29/2017  Time spent: 35 minutes  Recommendations for Outpatient Follow-up:  1. Repeat BMET to follow electrolytes and renal function  2. Please repeat CXR in 4-6 weeks to follow resolution of infiltrates 3. Follow final results from urine culture sensitivity and if needed adjust antibiotic therapy. 4. Reassess needs for oxygen and discontinue supplementation one appropriate.   Discharge Diagnoses:  Principal Problem:   HCAP (healthcare-associated pneumonia) Active Problems:   Hypertension   Paroxysmal atrial fibrillation (HCC)   Hypothyroidism   Breast cancer ([place])   Discharge Condition: stable and improved. Discharge back to ALF for further care and instructions to follow up with PCP in 2 weeks.  Diet recommendation: heart healthy diet   Filed Weights   03/27/17 2132 03/28/17 0106  Weight: 79.4 kg (175 lb) 77 kg (169 lb 12.1 oz)    History of present illness:  83 y.o.femalewithhistory of atrial fibrillation, hypertension, breast cancer in remission, on Lasix was brought to the ER because of patient feeling weak dizzy and was found to be febrile. Patient had complained to the ER for several days of coughing spells. Found with sepsis due to UTI and also infiltrates on x-ray.   Hospital Course:  1-sepsis due to HCAP and E. Coli UTI -will narrow antibiotics and discharge her on ceftin and zithromax. -follow final culture results and adjust coverage if otherwise needed -patient with fantastic response and demanding to be discharge home. -continue supportive care and use PRN antipyretics  -lactic acid is WNL and patient hemodynamically stable -patient met sepsis criteria on admission with elevated HR, high temperature, elevated WBC's and findings on CXR and UA as source for infection. -neg influenza by  PCR -sepsis features essentially resolved prior to discharge -patient oxygen saturation check on ambulation and found to be mildly low; O2 supplementation arranged for  RN use on exertion.  2-paroxysmal A. Fib/sinus bradycardia -will discharge on 1/2 dose of amiodarone and continue apixaban -Sinus rhythm seen on telemetry -CHADsVASC score 3  3-HTN -stable and well controlled -continue benazepril, amlodipine and lasix -low sodium diet recommended   4-chronic diastolic HF -compensated -advise to follow low sodium diet and daily weights -continue diuretics and ARB  5-hypothyroidism -will continue synthroid  -TSH WNL  6-hx of breast cancer -continue tamoxifen   7-depression -will continue effexor  8-hypokalemia -repleted -will recommend BMET to follow electrolytes    Procedures:  See below for x-ray reports   Consultations:  None   Discharge Exam: Vitals:   03/28/17 2050 03/29/17 0451  BP: (!) 126/54 (!) 122/56  Pulse: (!) 53 60  Resp: 16 16  Temp: 99.5 F (37.5 C) 99 F (37.2 C)  SpO2: 94% 98%     General:  afebrile now for 24 hours; no CP and demanding to be discharge home. Patient reported breathing is back to baseline and is not having any nausea or vomiting. Good O2 sat on RA  Cardiovascular: sinus bradycardia on exam and telemetry; no rubs, no gallops, no JVD; positive SEM.  Respiratory: improved air movement, no wheezing, no crackles. Positive rhonchi appreciated.   Abdomen: soft, NT, ND, positive BS  Musculoskeletal: no cyanosis, no clubbing, positive edema 1-2 plus edema bilaterally (per patient swelling is chronic, and in fact better today).   Discharge Instructions   Discharge Instructions    (HEART FAILURE PATIENTS) Call MD:  Anytime you have any of  the following symptoms: 1) 3 pound weight gain in 24 hours or 5 pounds in 1 week 2) shortness of breath, with or without a dry hacking cough 3) swelling in the hands, feet or stomach 4)  if you have to sleep on extra pillows at night in order to breathe.   Complete by:  As directed    Diet - low sodium heart healthy   Complete by:  As directed    Discharge instructions   Complete by:  As directed    Follow heart healthy/low sodium diet Take medications as prescribed Maintain adequate hydration  Arrange follow up with PCP in 2 weeks.     Current Discharge Medication List    START taking these medications   Details  azithromycin (ZITHROMAX) 250 MG tablet Take 1 tablet (250 mg total) daily for 4 days by mouth. Qty: 4 tablet, Refills: 0    cefUROXime (CEFTIN) 250 MG tablet Take 1 tablet (250 mg total) 2 (two) times daily for 6 days by mouth. Qty: 12 tablet, Refills: 0      CONTINUE these medications which have CHANGED   Details  amiodarone (PACERONE) 100 MG tablet Take 1 tablet (100 mg total) daily by mouth. Qty: 30 tablet, Refills: 1      CONTINUE these medications which have NOT CHANGED   Details  amLODipine (NORVASC) 10 MG tablet Take 10 mg by mouth daily.      apixaban (ELIQUIS) 5 MG TABS tablet Take 5 mg by mouth 2 (two) times daily.    baclofen (LIORESAL) 10 MG tablet Take 10 mg by mouth 3 (three) times daily.      benazepril (LOTENSIN) 20 MG tablet Take 20 mg by mouth at bedtime.     calcium carbonate (OSCAL) 1500 (600 Ca) MG TABS tablet Take 1,200 mg of elemental calcium by mouth daily with breakfast.    carboxymethylcellulose (REFRESH PLUS) 0.5 % SOLN Place 1 drop into both eyes 4 (four) times daily as needed (dry eyes).    cholecalciferol (VITAMIN D) 1000 units tablet Take 1,000 Units by mouth daily.    furosemide (LASIX) 80 MG tablet Take 80 mg by mouth every morning.      gabapentin (NEURONTIN) 100 MG capsule Take 200 mg 4 (four) times daily by mouth.    levothyroxine (SYNTHROID, LEVOTHROID) 50 MCG tablet Take 50 mcg by mouth daily.      loperamide (IMODIUM) 2 MG capsule Take 2 mg by mouth 4 (four) times daily as needed. For loose  stools/diarrhea     menthol-zinc oxide (GOLD BOND) powder Apply 1 application topically daily.    Multiple Vitamins-Minerals (MULTIVITAMINS THER. W/MINERALS) TABS Take 1 tablet by mouth daily.      OXYQUINOLONE SULFATE VAGINAL (TRIMO-SAN) 0.025 % GEL Place 1 application vaginally at bedtime. On Tuesday and friday    potassium chloride SA (K-DUR,KLOR-CON) 20 MEQ tablet Take 20 mEq by mouth daily.      Skin Protectants, Misc. (EUCERIN) cream Apply 1 application as needed topically for dry skin.    tamoxifen (NOLVADEX) 10 MG tablet Take 10 mg by mouth daily.      traMADol (ULTRAM) 50 MG tablet Take 50 mg by mouth every 6 (six) hours as needed for moderate pain.    venlafaxine (EFFEXOR-XR) 150 MG 24 hr capsule Take 150 mg by mouth daily.      acetaminophen (TYLENOL) 325 MG tablet Take 325 mg by mouth every 4 (four) hours as needed. For pain/headache/fever     calcium carbonate (  TUMS - DOSED IN MG ELEMENTAL CALCIUM) 500 MG chewable tablet Chew 2 tablets by mouth as needed for indigestion or heartburn.    guaiFENesin (MUCINEX) 600 MG 12 hr tablet Take 600 mg by mouth 2 (two) times daily as needed for cough.    polyethylene glycol (MIRALAX / GLYCOLAX) packet Take 17 g by mouth daily as needed. For constipation.     tolnaftate (TINACTIN) 1 % powder Apply 1 application topically as needed for itching.      STOP taking these medications     Gabapentin, PHN, (GRALISE) 600 MG TABS        Allergies  Allergen Reactions  . Sulfa Antibiotics Other (See Comments)    Per mar   Follow-up Information    [name], MD. Schedule an appointment as soon as possible for a visit in 2 week(s).   Specialty:  Family Medicine Contact information: [address] [place] [address] [phone]           The results of significant diagnostics from this hospitalization (including imaging, microbiology, ancillary and laboratory) are listed below for reference.     Significant Diagnostic Studies: Dg Chest 2 View  Result Date: 03/27/2017 CLINICAL DATA:  Hypoxia and fever EXAM: CHEST  2 VIEW COMPARISON:  11/21/2016 FINDINGS: Mild cardiomegaly. Aortic atherosclerosis. Streaky atelectasis or infiltrate at the left lung base. No pneumothorax. IMPRESSION: Cardiomegaly.  Streaky atelectasis or infiltrate at the left base Electronically Signed   By: Donavan Foil M.D.   On: 03/27/2017 22:54    Microbiology: Recent Results (from the past 240 hour(s))  MRSA PCR Screening     Status: None   Collection Time: 03/28/17  1:27 AM  Result Value Ref Range Status   MRSA by PCR NEGATIVE NEGATIVE Final    Comment:        The GeneXpert MRSA Assay (FDA approved for NASAL specimens only), is one component of a comprehensive MRSA colonization surveillance program. It is not intended to diagnose MRSA infection nor to guide or monitor treatment for MRSA infections.   Urine culture     Status: Abnormal (Preliminary result)   Collection Time: 03/28/17  6:00 AM  Result Value Ref Range Status   Specimen Description URINE, CLEAN CATCH  Final   Special Requests NONE  Final   Culture (A)  Final    >=100,000 COLONIES/mL ESCHERICHIA COLI SUSCEPTIBILITIES TO FOLLOW Performed at [facility], [address], [place], [place] [address]    Report Status PENDING  Incomplete     Labs: Basic Metabolic Panel: Recent Labs  Lab 03/27/17 2217 03/28/17 0127 03/29/17 0451  NA 139 139 140  K 3.3* 3.5 3.4*  CL 102 102 101  CO2 30 28 32  GLUCOSE 155* 150* 113*  BUN _0 CREATININE 0.74 0.68 0.66  CALCIUM 8.2* 8.3* 8.2*   Liver Function Tests: Recent Labs  Lab 03/27/17 2217  AST 15  ALT 13*  ALKPHOS 43  BILITOT 0.7  PROT 6.0*  ALBUMIN 3.2*   CBC: Recent Labs  Lab 03/27/17 2217 03/28/17 0127 03/29/17 0451  WBC 17.4* 19.5* 12.9*  NEUTROABS 15.6*  --   --   HGB 11.5* 12.3 12.7  HCT 36.3 38.7 39.9  MCV 91.0 91.1 92.1  PLT 172 149* 178      Signed:  Barton Dubois MD.  [facility] 03/29/2017, 3:14 PM

## 2017-03-29 NOTE — Progress Notes (Deleted)
sd

## 2017-03-29 NOTE — Clinical Social Work Note (Signed)
Clinical Social Work Assessment  Patient Details  Name: [name] MRN: [id number] Date of Birth: 05-19-1930  Date of referral:  03/29/17               Reason for consult:  Discharge Planning                Permission sought to share information with:  Case Manager, Facility Sport and exercise psychologist, Family Supports Permission granted to share information::     Name::        Agency::     Relationship::     Contact Information:     Housing/Transportation Living arrangements for the past 2 months:  [place] of Information:  Patient, Facility Patient Interpreter Needed:  None Criminal Activity/Legal Involvement Pertinent to Current Situation/Hospitalization:  No - Comment as needed Significant Relationships:  Other Family Members Lives with:  Facility Resident Do you feel safe going back to the place where you live?  Yes Need for family participation in patient care:  Yes (Comment)  Care giving concerns:  No care giving concerns at time of assessment.    Social Worker assessment / plan:  LCSW following return to facility.  Patient is from [facility].   Patient was admitted to the hospital for fever and dizziness.  LCSW met with patient at bedside. Patient was upset and tearful. She had dropped her remote on the floor and was not able to reach anyone for an hour. She appeared to be overwhelmed and upset. She expressed that she was ready to go home. She stated the nurse came in before LCSW arrived and was gone to get her water.   No family was at bedside, but patient stated she spoke with her family phone yesterday.   LCSW spoke with the facility who reported that patient is independent in ADLs at facility. Facility reports patient ambulates with a rolling walker.   PLAN: Patient will return to ALF at [place].       Employment status:  Retired Forensic scientist:  Medicare PT Recommendations:  No Follow Up Information / Referral to community  resources:     Patient/Family's Response to care:  Patient is frustrated and sad. She continuously stated to LCSW she wanted to go home and asked if she will be going home today.   Patient/Family's Understanding of and Emotional Response to Diagnosis, Current Treatment, and Prognosis:  Patient was very emotional at bedside. She verbalized that she had a fever and it was her understanding that she would go home today if the fever was gone. Patient was frustrated and ready to go home.   Emotional Assessment Appearance:  Appears stated age Attitude/Demeanor/Rapport:    Affect (typically observed):  Afraid/Fearful, Sad Orientation:  Oriented to Self, Oriented to Place, Oriented to  Time, Oriented to Situation Alcohol / Substance use:  Not Applicable Psych involvement (Current and /or in the community):  No (Comment)  Discharge Needs  Concerns to be addressed:  No discharge needs identified Readmission within the last 30 days:  No Current discharge risk:  None Barriers to Discharge:  Continued Medical Work up   [employer], LCSW 03/29/2017, 11:18 AM

## 2017-03-29 NOTE — Progress Notes (Signed)
SATURATION QUALIFICATIONS: (This note is used to comply with regulatory documentation for home oxygen)  Patient Saturations on Room Air at Rest = 93%  Patient Saturations on Room Air while Ambulating = 88%  Patient Saturations on 1 Liters of oxygen while Ambulating = 92%  Please briefly explain why patient needs home oxygen:

## 2017-03-29 NOTE — NC FL2 (Signed)
[place] LEVEL OF CARE SCREENING TOOL     IDENTIFICATION  Patient Name: [name] Birthdate: Mar 15, 1931 Sex: female Admission Date (Current Location): 03/27/2017  [facility] and [place] Number:  Herbalist and Address:  [facility],  [place], [place]      Provider Number: [id number]  Attending Physician Name and Address:  Barton Dubois, MD  Relative Name and Phone Number:       Current Level of Care: Hospital Recommended Level of Care: [place] Prior Approval Number:    Date Approved/Denied:   PASRR Number:    Discharge Plan: Other (Comment)(assisted living facility)    Current Diagnoses: Patient Active Problem List   Diagnosis Date Noted  . HCAP (healthcare-associated pneumonia) 03/27/2017  . Respiratory failure with hypoxia ([place]) 11/02/2015  . Depression 11/02/2015  . Anxiety 11/02/2015  . Hypertension 11/02/2015  . Paroxysmal atrial fibrillation ([place]) 11/02/2015  . Hypothyroidism 11/02/2015  . Chronic venous stasis dermatitis 11/02/2015  . Chronic pain 11/02/2015  . Breast cancer ([place]) 11/02/2015    Orientation RESPIRATION BLADDER Height & Weight     Self, Time, Situation, Place  O2(1L) Continent Weight: 169 lb 12.1 oz (77 kg) Height:  5' (152.4 cm)  BEHAVIORAL SYMPTOMS/MOOD NEUROLOGICAL BOWEL NUTRITION STATUS      Continent Diet(no added salt)  AMBULATORY STATUS COMMUNICATION OF NEEDS Skin   Independent(with rolling walker) Verbally Normal                       Personal Care Assistance Level of Assistance  Bathing, Feeding, Dressing Bathing Assistance: Independent Feeding assistance: Independent Dressing Assistance: Independent     Functional Limitations Info  Sight, Hearing, Speech Sight Info: Adequate Hearing Info: Adequate Speech Info: Adequate    SPECIAL CARE FACTORS FREQUENCY                       Contractures Contractures Info: Not present     Additional Factors Info  Code Status, Allergies Code Status Info: DNR Allergies Info: Sulfa antibiotics           Current Medications (03/29/2017):  This is the current hospital active medication list Current Facility-Administered Medications  Medication Dose Route Frequency Provider Last Rate Last Dose  . acetaminophen (TYLENOL) tablet 650 mg  650 mg Oral Q6H PRN Rise Patience, MD   650 mg at 03/29/17 1307   Or  . acetaminophen (TYLENOL) suppository 650 mg  650 mg Rectal Q6H PRN Rise Patience, MD      . Derrill Memo ON 03/30/2017] amiodarone (PACERONE) tablet 100 mg  100 mg Oral Daily Barton Dubois, MD      . amLODipine (NORVASC) tablet 10 mg  10 mg Oral Daily Rise Patience, MD   10 mg at 03/29/17 0853  . apixaban (ELIQUIS) tablet 5 mg  5 mg Oral BID Rise Patience, MD   5 mg at 03/29/17 0855  . azithromycin (ZITHROMAX) tablet 250 mg  250 mg Oral Daily Barton Dubois, MD   250 mg at 03/29/17 0855  . baclofen (LIORESAL) tablet 10 mg  10 mg Oral TID Rise Patience, MD   10 mg at 03/29/17 0855  . benazepril (LOTENSIN) tablet 20 mg  20 mg Oral QHS Rise Patience, MD   20 mg at 03/28/17 2052  . calcium carbonate (OS-CAL - dosed in mg of elemental calcium) tablet 1,000 mg of elemental calcium  1,000 mg  of elemental calcium Oral Q breakfast Rise Patience, MD   1,000 mg of elemental calcium at 03/29/17 0854  . calcium carbonate (TUMS - dosed in mg elemental calcium) chewable tablet 400 mg of elemental calcium  2 tablet Oral PRN Rise Patience, MD      . ceFEPIme (MAXIPIME) 1 g in dextrose 5 % 50 mL IVPB  1 g Intravenous Q12H Dorrene German, [facility]   Stopped at 03/29/17 1610  . cholecalciferol (VITAMIN D) tablet 1,000 Units  1,000 Units Oral Daily Rise Patience, MD   1,000 Units at 03/29/17 0855  . furosemide (LASIX) tablet 80 mg  80 mg Oral Daily Rise Patience, MD   80 mg at 03/29/17 0853  . gabapentin (NEURONTIN) capsule 200 mg   200 mg Oral QID Rise Patience, MD   200 mg at 03/29/17 1307  . guaiFENesin (MUCINEX) 12 hr tablet 600 mg  600 mg Oral BID PRN Rise Patience, MD      . ipratropium-albuterol (DUONEB) 0.5-2.5 (3) MG/3ML nebulizer solution 3 mL  3 mL Nebulization Q8H PRN Barton Dubois, MD      . levothyroxine (SYNTHROID, LEVOTHROID) tablet 50 mcg  50 mcg Oral Q breakfast Rise Patience, MD   50 mcg at 03/29/17 [phone]  . ondansetron (ZOFRAN) tablet 4 mg  4 mg Oral Q6H PRN Rise Patience, MD       Or  . ondansetron [facility]) injection 4 mg  4 mg Intravenous Q6H PRN Rise Patience, MD      . polyethylene glycol (MIRALAX / GLYCOLAX) packet 17 g  17 g Oral Daily PRN Rise Patience, MD      . polyvinyl alcohol (LIQUIFILM TEARS) 1.4 % ophthalmic solution 1 drop  1 drop Both Eyes QID PRN Rise Patience, MD   1 drop at 03/28/17 1811  . potassium chloride SA (K-DUR,KLOR-CON) CR tablet 20 mEq  20 mEq Oral Daily Rise Patience, MD   20 mEq at 03/29/17 1054  . tamoxifen (NOLVADEX) tablet 10 mg  10 mg Oral Daily Rise Patience, MD   10 mg at 03/29/17 0853  . traMADol (ULTRAM) tablet 50 mg  50 mg Oral Q6H PRN Rise Patience, MD   50 mg at 03/29/17 0912  . venlafaxine XR (EFFEXOR-XR) 24 hr capsule 150 mg  150 mg Oral Daily Rise Patience, MD   150 mg at 03/29/17 5409     Discharge Medications: START taking these medications   Details  azithromycin (ZITHROMAX) 250 MG tablet Take 1 tablet (250 mg total) daily for 4 days by mouth. Qty: 4 tablet, Refills: 0    cefUROXime (CEFTIN) 250 MG tablet Take 1 tablet (250 mg total) 2 (two) times daily for 6 days by mouth. Qty: 12 tablet, Refills: 0          CONTINUE these medications which have CHANGED   Details  amiodarone (PACERONE) 100 MG tablet Take 1 tablet (100 mg total) daily by mouth. Qty: 30 tablet, Refills: 1      CONTINUE these medications which have NOT CHANGED   Details  amLODipine (NORVASC) 10  MG tablet Take 10 mg by mouth daily.      apixaban (ELIQUIS) 5 MG TABS tablet Take 5 mg by mouth 2 (two) times daily.    baclofen (LIORESAL) 10 MG tablet Take 10 mg by mouth 3 (three) times daily.      benazepril (LOTENSIN) 20 MG tablet Take 20 mg  by mouth at bedtime.     calcium carbonate (OSCAL) 1500 (600 Ca) MG TABS tablet Take 1,200 mg of elemental calcium by mouth daily with breakfast.    carboxymethylcellulose (REFRESH PLUS) 0.5 % SOLN Place 1 drop into both eyes 4 (four) times daily as needed (dry eyes).    cholecalciferol (VITAMIN D) 1000 units tablet Take 1,000 Units by mouth daily.    furosemide (LASIX) 80 MG tablet Take 80 mg by mouth every morning.      gabapentin (NEURONTIN) 100 MG capsule Take 200 mg 4 (four) times daily by mouth.    levothyroxine (SYNTHROID, LEVOTHROID) 50 MCG tablet Take 50 mcg by mouth daily.      loperamide (IMODIUM) 2 MG capsule Take 2 mg by mouth 4 (four) times daily as needed. For loose stools/diarrhea     menthol-zinc oxide (GOLD BOND) powder Apply 1 application topically daily.    Multiple Vitamins-Minerals (MULTIVITAMINS THER. W/MINERALS) TABS Take 1 tablet by mouth daily.      OXYQUINOLONE SULFATE VAGINAL (TRIMO-SAN) 0.025 % GEL Place 1 application vaginally at bedtime. On Tuesday and friday    potassium chloride SA (K-DUR,KLOR-CON) 20 MEQ tablet Take 20 mEq by mouth daily.      Skin Protectants, Misc. (EUCERIN) cream Apply 1 application as needed topically for dry skin.    tamoxifen (NOLVADEX) 10 MG tablet Take 10 mg by mouth daily.      traMADol (ULTRAM) 50 MG tablet Take 50 mg by mouth every 6 (six) hours as needed for moderate pain.    venlafaxine (EFFEXOR-XR) 150 MG 24 hr capsule Take 150 mg by mouth daily.      acetaminophen (TYLENOL) 325 MG tablet Take 325 mg by mouth every 4 (four) hours as needed. For pain/headache/fever     calcium carbonate (TUMS - DOSED IN MG ELEMENTAL CALCIUM) 500 MG chewable tablet  Chew 2 tablets by mouth as needed for indigestion or heartburn.    guaiFENesin (MUCINEX) 600 MG 12 hr tablet Take 600 mg by mouth 2 (two) times daily as needed for cough.    polyethylene glycol (MIRALAX / GLYCOLAX) packet Take 17 g by mouth daily as needed. For constipation.     tolnaftate (TINACTIN) 1 % powder Apply 1 application topically as needed for itching.         STOP taking these medications     Gabapentin, PHN, (GRALISE) 600 MG TABS          Relevant Imaging Results:  Relevant Lab Results:   Additional Information SS# [id number].   Nila Nephew, LCSW

## 2017-03-29 NOTE — Progress Notes (Addendum)
Patient going back to [facility].  Facility is waiting for o2 to be delivered before patient can return. Facility will notify floor nurse once o2 arrives.  Once notified please call [facility] to set up transportation.   Patient will transport by [facility] [phone]. Provide [facility] with patients Name, DOB, Room number, Destination and SSN. SSN Number is on face sheet.    RN Report # [phone]  Patient needs DNR signed. LCSW notified MD.   Servando Snare, [place] [phone]

## 2017-03-30 LAB — URINE CULTURE

## 2017-04-09 DIAGNOSIS — F339 Major depressive disorder, recurrent, unspecified: Secondary | ICD-10-CM | POA: Diagnosis not present

## 2017-04-16 DIAGNOSIS — J159 Unspecified bacterial pneumonia: Secondary | ICD-10-CM | POA: Diagnosis not present

## 2017-04-16 DIAGNOSIS — F339 Major depressive disorder, recurrent, unspecified: Secondary | ICD-10-CM | POA: Diagnosis not present

## 2017-04-16 DIAGNOSIS — N390 Urinary tract infection, site not specified: Secondary | ICD-10-CM | POA: Diagnosis not present

## 2017-04-16 DIAGNOSIS — I890 Lymphedema, not elsewhere classified: Secondary | ICD-10-CM | POA: Diagnosis not present

## 2017-04-16 DIAGNOSIS — A4151 Sepsis due to Escherichia coli [E. coli]: Secondary | ICD-10-CM | POA: Diagnosis not present

## 2017-04-22 ENCOUNTER — Other Ambulatory Visit: Payer: Self-pay

## 2017-04-22 ENCOUNTER — Emergency Department (HOSPITAL_COMMUNITY): Payer: Medicare Other

## 2017-04-22 ENCOUNTER — Inpatient Hospital Stay (HOSPITAL_COMMUNITY)
Admission: EM | Admit: 2017-04-22 | Discharge: 2017-04-25 | DRG: 177 | Disposition: A | Discharge status: Skilled Nursing Facility | Payer: Medicare Other | Attending: Family Medicine | Admitting: Family Medicine

## 2017-04-22 ENCOUNTER — Encounter (HOSPITAL_COMMUNITY): Payer: Self-pay | Admitting: Internal Medicine

## 2017-04-22 DIAGNOSIS — R519 Headache, unspecified: Secondary | ICD-10-CM

## 2017-04-22 DIAGNOSIS — R51 Headache: Secondary | ICD-10-CM | POA: Diagnosis present

## 2017-04-22 DIAGNOSIS — J9621 Acute and chronic respiratory failure with hypoxia: Secondary | ICD-10-CM | POA: Diagnosis present

## 2017-04-22 DIAGNOSIS — Z79899 Other long term (current) drug therapy: Secondary | ICD-10-CM | POA: Diagnosis not present

## 2017-04-22 DIAGNOSIS — I119 Hypertensive heart disease without heart failure: Secondary | ICD-10-CM | POA: Diagnosis present

## 2017-04-22 DIAGNOSIS — Z7901 Long term (current) use of anticoagulants: Secondary | ICD-10-CM | POA: Diagnosis not present

## 2017-04-22 DIAGNOSIS — Z8249 Family history of ischemic heart disease and other diseases of the circulatory system: Secondary | ICD-10-CM

## 2017-04-22 DIAGNOSIS — R1312 Dysphagia, oropharyngeal phase: Secondary | ICD-10-CM | POA: Diagnosis not present

## 2017-04-22 DIAGNOSIS — C50919 Malignant neoplasm of unspecified site of unspecified female breast: Secondary | ICD-10-CM | POA: Diagnosis not present

## 2017-04-22 DIAGNOSIS — F419 Anxiety disorder, unspecified: Secondary | ICD-10-CM | POA: Diagnosis present

## 2017-04-22 DIAGNOSIS — J9691 Respiratory failure, unspecified with hypoxia: Secondary | ICD-10-CM | POA: Diagnosis not present

## 2017-04-22 DIAGNOSIS — Z853 Personal history of malignant neoplasm of breast: Secondary | ICD-10-CM | POA: Diagnosis not present

## 2017-04-22 DIAGNOSIS — F418 Other specified anxiety disorders: Secondary | ICD-10-CM | POA: Diagnosis not present

## 2017-04-22 DIAGNOSIS — J690 Pneumonitis due to inhalation of food and vomit: Principal | ICD-10-CM | POA: Diagnosis present

## 2017-04-22 DIAGNOSIS — Z881 Allergy status to other antibiotic agents status: Secondary | ICD-10-CM

## 2017-04-22 DIAGNOSIS — R110 Nausea: Secondary | ICD-10-CM | POA: Diagnosis not present

## 2017-04-22 DIAGNOSIS — E785 Hyperlipidemia, unspecified: Secondary | ICD-10-CM | POA: Diagnosis present

## 2017-04-22 DIAGNOSIS — G4489 Other headache syndrome: Secondary | ICD-10-CM | POA: Diagnosis not present

## 2017-04-22 DIAGNOSIS — Z79890 Hormone replacement therapy (postmenopausal): Secondary | ICD-10-CM

## 2017-04-22 DIAGNOSIS — R2681 Unsteadiness on feet: Secondary | ICD-10-CM | POA: Diagnosis not present

## 2017-04-22 DIAGNOSIS — R1114 Bilious vomiting: Secondary | ICD-10-CM | POA: Diagnosis not present

## 2017-04-22 DIAGNOSIS — K521 Toxic gastroenteritis and colitis: Secondary | ICD-10-CM | POA: Diagnosis not present

## 2017-04-22 DIAGNOSIS — M797 Fibromyalgia: Secondary | ICD-10-CM | POA: Diagnosis present

## 2017-04-22 DIAGNOSIS — R0602 Shortness of breath: Secondary | ICD-10-CM | POA: Diagnosis not present

## 2017-04-22 DIAGNOSIS — L308 Other specified dermatitis: Secondary | ICD-10-CM | POA: Diagnosis present

## 2017-04-22 DIAGNOSIS — R112 Nausea with vomiting, unspecified: Secondary | ICD-10-CM | POA: Diagnosis not present

## 2017-04-22 DIAGNOSIS — I10 Essential (primary) hypertension: Secondary | ICD-10-CM | POA: Diagnosis not present

## 2017-04-22 DIAGNOSIS — F329 Major depressive disorder, single episode, unspecified: Secondary | ICD-10-CM | POA: Diagnosis present

## 2017-04-22 DIAGNOSIS — Z79810 Long term (current) use of selective estrogen receptor modulators (SERMs): Secondary | ICD-10-CM | POA: Diagnosis not present

## 2017-04-22 DIAGNOSIS — T3695XA Adverse effect of unspecified systemic antibiotic, initial encounter: Secondary | ICD-10-CM | POA: Diagnosis not present

## 2017-04-22 DIAGNOSIS — J9601 Acute respiratory failure with hypoxia: Secondary | ICD-10-CM | POA: Diagnosis not present

## 2017-04-22 DIAGNOSIS — Z9981 Dependence on supplemental oxygen: Secondary | ICD-10-CM

## 2017-04-22 DIAGNOSIS — J189 Pneumonia, unspecified organism: Secondary | ICD-10-CM | POA: Diagnosis not present

## 2017-04-22 DIAGNOSIS — Z79891 Long term (current) use of opiate analgesic: Secondary | ICD-10-CM | POA: Diagnosis not present

## 2017-04-22 DIAGNOSIS — E039 Hypothyroidism, unspecified: Secondary | ICD-10-CM | POA: Diagnosis present

## 2017-04-22 DIAGNOSIS — L8990 Pressure ulcer of unspecified site, unspecified stage: Secondary | ICD-10-CM

## 2017-04-22 DIAGNOSIS — J9600 Acute respiratory failure, unspecified whether with hypoxia or hypercapnia: Secondary | ICD-10-CM | POA: Diagnosis not present

## 2017-04-22 DIAGNOSIS — R52 Pain, unspecified: Secondary | ICD-10-CM | POA: Diagnosis not present

## 2017-04-22 DIAGNOSIS — Z66 Do not resuscitate: Secondary | ICD-10-CM | POA: Diagnosis present

## 2017-04-22 DIAGNOSIS — I480 Paroxysmal atrial fibrillation: Secondary | ICD-10-CM | POA: Diagnosis not present

## 2017-04-22 DIAGNOSIS — L309 Dermatitis, unspecified: Secondary | ICD-10-CM | POA: Diagnosis not present

## 2017-04-22 DIAGNOSIS — M6281 Muscle weakness (generalized): Secondary | ICD-10-CM | POA: Diagnosis not present

## 2017-04-22 DIAGNOSIS — L89611 Pressure ulcer of right heel, stage 1: Secondary | ICD-10-CM | POA: Diagnosis not present

## 2017-04-22 DIAGNOSIS — R42 Dizziness and giddiness: Secondary | ICD-10-CM | POA: Diagnosis not present

## 2017-04-22 LAB — URINALYSIS, ROUTINE W REFLEX MICROSCOPIC
BILIRUBIN URINE: NEGATIVE
Glucose, UA: NEGATIVE mg/dL
Hgb urine dipstick: NEGATIVE
Ketones, ur: NEGATIVE mg/dL
Leukocytes, UA: NEGATIVE
NITRITE: NEGATIVE
Protein, ur: NEGATIVE mg/dL
SPECIFIC GRAVITY, URINE: 1.012 (ref 1.005–1.030)
pH: 5 (ref 5.0–8.0)

## 2017-04-22 LAB — CBC
HCT: 38.8 % (ref 36.0–46.0)
HEMOGLOBIN: 12.4 g/dL (ref 12.0–15.0)
MCH: 29 pg (ref 26.0–34.0)
MCHC: 32 g/dL (ref 30.0–36.0)
MCV: 90.7 fL (ref 78.0–100.0)
Platelets: 229 10*3/uL (ref 150–400)
RBC: 4.28 MIL/uL (ref 3.87–5.11)
RDW: 14.8 % (ref 11.5–15.5)
WBC: 7.2 10*3/uL (ref 4.0–10.5)

## 2017-04-22 LAB — COMPREHENSIVE METABOLIC PANEL
ALBUMIN: 3.6 g/dL (ref 3.5–5.0)
ALK PHOS: 41 U/L (ref 38–126)
ALT: 12 U/L — ABNORMAL LOW (ref 14–54)
ANION GAP: 7 (ref 5–15)
AST: 32 U/L (ref 15–41)
BUN: 13 mg/dL (ref 6–20)
CALCIUM: 8.2 mg/dL — AB (ref 8.9–10.3)
CO2: 28 mmol/L (ref 22–32)
Chloride: 105 mmol/L (ref 101–111)
Creatinine, Ser: 0.65 mg/dL (ref 0.44–1.00)
GFR calc Af Amer: >60 mL/min (ref >60)
GFR calc non Af Amer: >60 mL/min (ref >60)
GLUCOSE: 121 mg/dL — AB (ref 65–99)
POTASSIUM: 4.6 mmol/L (ref 3.5–5.1)
SODIUM: 140 mmol/L (ref 135–145)
Total Bilirubin: 1.7 mg/dL — ABNORMAL HIGH (ref 0.3–1.2)
Total Protein: 6.7 g/dL (ref 6.5–8.1)

## 2017-04-22 LAB — LIPASE, BLOOD: Lipase: 31 U/L (ref 11–51)

## 2017-04-22 LAB — BRAIN NATRIURETIC PEPTIDE: B Natriuretic Peptide: 76.8 pg/mL (ref 0.0–100.0)

## 2017-04-22 MED ORDER — ACETAMINOPHEN 650 MG RE SUPP: 650.0000 mg | Freq: Four times a day (QID) | RECTAL | Status: DC | PRN

## 2017-04-22 MED ORDER — PIPERACILLIN-TAZOBACTAM 3.375 G IVPB
3.3750 g | Freq: Three times a day (TID) | INTRAVENOUS | Status: DC
  Administered 2017-04-22 – 2017-04-24 (×6): 3.375 g via INTRAVENOUS
  Filled 2017-04-22 (×6): qty 50

## 2017-04-22 MED ORDER — METOCLOPRAMIDE HCL 5 MG/ML IJ SOLN
5.0000 mg | Freq: Once | INTRAMUSCULAR | Status: AC
  Administered 2017-04-22: 5 mg via INTRAVENOUS
  Filled 2017-04-22: qty 2

## 2017-04-22 MED ORDER — APIXABAN 5 MG PO TABS
5.0000 mg | ORAL_TABLET | Freq: Two times a day (BID) | ORAL | Status: DC
  Administered 2017-04-22 – 2017-04-25 (×6): 5 mg via ORAL
  Filled 2017-04-22 (×6): qty 1

## 2017-04-22 MED ORDER — ONDANSETRON 4 MG PO TBDP
4.0000 mg | ORAL_TABLET | Freq: Once | ORAL | Status: DC | PRN
  Filled 2017-04-22: qty 1

## 2017-04-22 MED ORDER — AMIODARONE HCL 100 MG PO TABS
100.0000 mg | ORAL_TABLET | Freq: Every day | ORAL | Status: DC
  Administered 2017-04-23 – 2017-04-25 (×3): 100 mg via ORAL
  Filled 2017-04-22 (×3): qty 1

## 2017-04-22 MED ORDER — VANCOMYCIN HCL 10 G IV SOLR
1500.0000 mg | Freq: Once | INTRAVENOUS | Status: AC
  Administered 2017-04-22: 1500 mg via INTRAVENOUS
  Filled 2017-04-22: qty 1500

## 2017-04-22 MED ORDER — VENLAFAXINE HCL ER 75 MG PO CP24
150.0000 mg | ORAL_CAPSULE | Freq: Every day | ORAL | Status: DC
  Administered 2017-04-23 – 2017-04-25 (×3): 150 mg via ORAL
  Filled 2017-04-22 (×3): qty 2

## 2017-04-22 MED ORDER — ONDANSETRON HCL 4 MG PO TABS: 4.0000 mg | ORAL_TABLET | Freq: Four times a day (QID) | ORAL | Status: DC | PRN

## 2017-04-22 MED ORDER — AMLODIPINE BESYLATE 5 MG PO TABS: 10.0000 mg | ORAL_TABLET | Freq: Every day | ORAL | Status: DC

## 2017-04-22 MED ORDER — GABAPENTIN 100 MG PO CAPS
200.0000 mg | ORAL_CAPSULE | Freq: Four times a day (QID) | ORAL | Status: DC
  Administered 2017-04-22 – 2017-04-25 (×12): 200 mg via ORAL
  Filled 2017-04-22 (×12): qty 2

## 2017-04-22 MED ORDER — FUROSEMIDE 40 MG PO TABS
80.0000 mg | ORAL_TABLET | Freq: Every day | ORAL | Status: DC
  Administered 2017-04-23: 80 mg via ORAL
  Filled 2017-04-22: qty 2

## 2017-04-22 MED ORDER — LEVOTHYROXINE SODIUM 50 MCG PO TABS
50.0000 ug | ORAL_TABLET | Freq: Every day | ORAL | Status: DC
  Administered 2017-04-23 – 2017-04-25 (×3): 50 ug via ORAL
  Filled 2017-04-22 (×3): qty 1

## 2017-04-22 MED ORDER — ALBUTEROL SULFATE (2.5 MG/3ML) 0.083% IN NEBU
5.0000 mg | INHALATION_SOLUTION | Freq: Once | RESPIRATORY_TRACT | Status: AC
  Administered 2017-04-22: 5 mg via RESPIRATORY_TRACT
  Filled 2017-04-22: qty 6

## 2017-04-22 MED ORDER — ENALAPRILAT 1.25 MG/ML IV SOLN
1.2500 mg | Freq: Four times a day (QID) | INTRAVENOUS | Status: DC
  Administered 2017-04-22 – 2017-04-24 (×8): 1.25 mg via INTRAVENOUS
  Filled 2017-04-22 (×9): qty 1

## 2017-04-22 MED ORDER — BENAZEPRIL HCL 20 MG PO TABS: 20.0000 mg | ORAL_TABLET | Freq: Every day | ORAL | Status: DC

## 2017-04-22 MED ORDER — ACETAMINOPHEN 325 MG PO TABS
650.0000 mg | ORAL_TABLET | Freq: Once | ORAL | Status: DC
  Filled 2017-04-22: qty 2

## 2017-04-22 MED ORDER — SODIUM CHLORIDE 0.9 % IV SOLN
INTRAVENOUS | Status: DC
  Administered 2017-04-22: 12:00:00 via INTRAVENOUS

## 2017-04-22 MED ORDER — LEVOTHYROXINE SODIUM 50 MCG PO TABS: 50.0000 ug | ORAL_TABLET | Freq: Every day | ORAL | Status: DC

## 2017-04-22 MED ORDER — PIPERACILLIN-TAZOBACTAM 3.375 G IVPB 30 MIN
3.3750 g | Freq: Once | INTRAVENOUS | Status: AC
  Administered 2017-04-22: 3.375 g via INTRAVENOUS
  Filled 2017-04-22: qty 50

## 2017-04-22 MED ORDER — ONDANSETRON HCL 4 MG/2ML IJ SOLN
4.0000 mg | Freq: Four times a day (QID) | INTRAMUSCULAR | Status: DC | PRN
  Administered 2017-04-23: 4 mg via INTRAVENOUS
  Filled 2017-04-22: qty 2

## 2017-04-22 MED ORDER — TAMOXIFEN CITRATE 10 MG PO TABS
10.0000 mg | ORAL_TABLET | Freq: Every day | ORAL | Status: DC
  Administered 2017-04-23 – 2017-04-25 (×3): 10 mg via ORAL
  Filled 2017-04-22 (×3): qty 1

## 2017-04-22 MED ORDER — CEFEPIME HCL 1 G IJ SOLR
1.0000 g | Freq: Three times a day (TID) | INTRAMUSCULAR | Status: DC
  Filled 2017-04-22 (×2): qty 1

## 2017-04-22 MED ORDER — ACETAMINOPHEN 325 MG PO TABS
650.0000 mg | ORAL_TABLET | Freq: Four times a day (QID) | ORAL | Status: DC | PRN
  Administered 2017-04-23 – 2017-04-25 (×8): 650 mg via ORAL
  Filled 2017-04-22 (×8): qty 2

## 2017-04-22 MED ORDER — HYDRALAZINE HCL 20 MG/ML IJ SOLN: 5.0000 mg | INTRAMUSCULAR | Status: DC | PRN

## 2017-04-22 MED ORDER — SODIUM CHLORIDE 0.9 % IV SOLN
INTRAVENOUS | Status: DC
  Administered 2017-04-22 – 2017-04-25 (×4): via INTRAVENOUS

## 2017-04-22 NOTE — ED Notes (Signed)
ED Provider at bedside. 

## 2017-04-22 NOTE — ED Notes (Signed)
Bed: WA23 Expected date:  Expected time:  Means of arrival:  Comments: EMS-N/V 

## 2017-04-22 NOTE — ED Notes (Signed)
Pt O2 sat 90% on 1L. This RN put patient on 2L with O2 sat 93%.

## 2017-04-22 NOTE — ED Notes (Signed)
Patient transported to X-ray 

## 2017-04-22 NOTE — ED Notes (Signed)
Speech therapy at bedside.

## 2017-04-22 NOTE — ED Triage Notes (Signed)
Pt arrived via [facility] from [facility]. Per EMS, the nursing home staff went into patient's room at 0545 to give morning meds and patient complained of headache. Patient told EMS that she feels like "there's a bubble in my head." According to the staff at [facility], patient has emesis episodes every morning.

## 2017-04-22 NOTE — Progress Notes (Signed)
Pharmacy Antibiotic Note  [name] is a 83 y.o. female admitted on 04/22/2017 with aspiration pneumonia.  Pharmacy has been consulted for Zosyn dosing.  Pt comes from NH.  Patient is a poor historian, but states that she vomits almost every day, and this is not unusual for her.  Pt with recent admission for HCAP/UTI from 11/14 to 11/16. Pt discharged on Ceftin and Zithromax     Plan:  Zosyn 3.375g IV q8h (4 hour infusion).   Monitor clinical course, renal function, cultures as available   Height: 5' (152.4 cm) Weight: 170 lb (77.1 kg) IBW/kg (Calculated) : 45.5  Temp (24hrs), Avg:98 F (36.7 C), Min:98 F (36.7 C), Max:98 F (36.7 C)  Recent Labs  Lab 04/22/17 0920 04/22/17 1129  WBC 7.2  --   CREATININE  --  0.65    Estimated Creatinine Clearance: 48 mL/min (by C-G formula based on SCr of 0.65 mg/dL).    Allergies  Allergen Reactions  . Sulfa Antibiotics Other (See Comments)    Per mar    Antimicrobials this admission: 12/10 vancomycin x1 in ED  12/10 Zosyn >>    Dose adjustments this admission: --  Microbiology results:    Thank you for allowing pharmacy to be a part of this patient's care.   Royetta Asal, PharmD, BCPS Pager [phone] 04/22/2017 1:49 PM

## 2017-04-22 NOTE — ED Notes (Signed)
Spoke with Dr. Wyline Copas about patient's hypertension. New orders added for IV lisinopril until patient is able to swallow PO blood pressure medicines.

## 2017-04-22 NOTE — Progress Notes (Signed)
A consult was received from an ED physician for vancomycin per pharmacy dosing.  The patient's profile has been reviewed for ht/wt/allergies/indication/available labs.   A one time order has been placed for vancomycin 1500mg  IV x 1.    Further antibiotics/pharmacy consults should be ordered by admitting physician if indicated.                       Thank you, [name] 04/22/2017  11:19 AM

## 2017-04-22 NOTE — ED Notes (Addendum)
Patient O2 sat 86% on 2L O2. Put patient on 3L and O2 sat stayed at 88%. Placed patient on 5L and O2 sat 90%. Pt states "it feels like I can't breathe on my right side" ED Provider Dr. Vanita Panda made aware.

## 2017-04-22 NOTE — Evaluation (Addendum)
Clinical/Bedside Swallow Evaluation Patient Details  Name: [name] MRN: [id number] Date of Birth: 1930-07-05  Today's Date: 04/22/2017 Time: SLP Start Time (ACUTE ONLY): 3 SLP Stop Time (ACUTE ONLY): 1330 SLP Time Calculation (min) (ACUTE ONLY): 18 min  Past Medical History:  Past Medical History:  Diagnosis Date  . Anxiety   . Atrial fibrillation ([place])   . Breast CA ([place])   . Breast cancer ([place])   . Constipation   . Depression   . Disorder of bone density and structure, unspecified   . Fibromyalgia   . Hyperkalemia   . Hyperlipemia   . Hypertension   . Impaired fasting glucose   . Retention of urine, unspecified   . Scoliosis   . Thyroid disease    Past Surgical History:  Past Surgical History:  Procedure Laterality Date  . FRACTURE SURGERY    . HIP FRACTURE SURGERY     HPI:  83 year old female admitted from SNF with NCAP, nausea/vomiting (daily), headache. CXR: Mild right lung base patchy opacity is nonspecific. Consider aspiration in this clinical setting. Note h/o hiatal hernia and Nissen fundoplication.   Assessment / Plan / Recommendation Clinical Impression  Patient presents with what appears to be a normal oropharyngeal swallow although pos limited due to patient refusal. Prolonged oral transit of regular texture solids noted secondary to missing dentition however functional. Patient without overt s/s of aspiration. Given limitations of today's evaluation, will f/u 12/11. Suspect however that aspiration PNA secondary to aspiration during recurrent emesis episodes rather than oropharyngeal dysphagia. Given h/o hiatal hernia and Nissen fundoplication, may benefit from GI workup.   SLP Visit Diagnosis: Dysphagia, unspecified (R13.10)    Aspiration Risk       Diet Recommendation Dysphagia 3 (Mech soft);Thin liquid   Liquid Administration via: Cup;Straw Medication Administration: Whole meds with liquid Supervision: Patient able to self  feed Compensations: Slow rate;Small sips/bites Postural Changes: Seated upright at 90 degrees    Other  Recommendations Oral Care Recommendations: Oral care BID   Follow up Recommendations None      Frequency and Duration min 1 x/week  1 week           Swallow Study   General HPI: 83 year old female admitted from SNF with NCAP, nausea/vomiting (daily), headache. CXR: Mild right lung base patchy opacity is nonspecific. Consider aspiration in this clinical setting. Type of Study: Bedside Swallow Evaluation Previous Swallow Assessment: none Diet Prior to this Study: Dysphagia 3 (soft);Thin liquids Temperature Spikes Noted: No Respiratory Status: Nasal cannula History of Recent Intubation: No Behavior/Cognition: Alert;Uncooperative Oral Cavity Assessment: Within Functional Limits Oral Care Completed by SLP: No Oral Cavity - Dentition: Adequate natural dentition Vision: Functional for self-feeding Self-Feeding Abilities: Able to feed self Patient Positioning: Upright in bed Baseline Vocal Quality: Normal Volitional Cough: Strong Volitional Swallow: Able to elicit    Oral/Motor/Sensory Function Overall Oral Motor/Sensory Function: Within functional limits   Ice Chips Ice chips: Not tested   Thin Liquid Thin Liquid: Within functional limits Presentation: Straw    Nectar Thick Nectar Thick Liquid: Not tested   Honey Thick Honey Thick Liquid: Not tested   Puree Puree: Not tested   Solid   Desteni Piscopo MA, CCC-SLP [phone]    Solid: Within functional limits Presentation: Self Fed          [name] 04/22/2017,1:37 PM

## 2017-04-22 NOTE — ED Notes (Signed)
Assigned 1425 @ 16:29 call report @ 16:49

## 2017-04-22 NOTE — ED Notes (Signed)
Patient transported to CT 

## 2017-04-22 NOTE — ED Provider Notes (Signed)
[facility] DEPT Provider Note   CSN: [id number] Arrival date & time: 04/22/17  0801     History   Chief Complaint Chief Complaint  Patient presents with  . Headache  . Emesis    HPI [name] is a 83 y.o. female.  HPI Patient presents from her nursing facility due to concern for headache and vomiting. Patient is a poor historian, but states that she vomits almost every day, and this is not unusual for her. However, today the patient notes that she had a headache which is unusual, and she notes that it is the most substantial headache she has felt. She denies weakness anywhere, acknowledges mild nausea, denies vision changes, confusion, neck pain, fever, chills. No recent changes in medicine that she is aware of.  Nursing home notes indicate that the patient was evaluated this morning approximately 3 hours ago, when she began to complain of headache, also indicate that the patient does have daily episodes of emesis. Past Medical History:  Diagnosis Date  . Anxiety   . Atrial fibrillation ([place])   . Breast CA ([place])   . Breast cancer ([place])   . Constipation   . Depression   . Disorder of bone density and structure, unspecified   . Fibromyalgia   . Hyperkalemia   . Hyperlipemia   . Hypertension   . Impaired fasting glucose   . Retention of urine, unspecified   . Scoliosis   . Thyroid disease     Patient Active Problem List   Diagnosis Date Noted  . HCAP (healthcare-associated pneumonia) 03/27/2017  . Respiratory failure with hypoxia ([place]) 11/02/2015  . Depression 11/02/2015  . Anxiety 11/02/2015  . Hypertension 11/02/2015  . Paroxysmal atrial fibrillation ([place]) 11/02/2015  . Hypothyroidism 11/02/2015  . Chronic venous stasis dermatitis 11/02/2015  . Chronic pain 11/02/2015  . Breast cancer ([place]) 11/02/2015    Past Surgical History:  Procedure Laterality Date  . FRACTURE SURGERY    . HIP FRACTURE SURGERY      OB  History    No data available       Home Medications    Prior to Admission medications   Medication Sig Start Date End Date Taking? Authorizing Provider  acetaminophen (TYLENOL) 325 MG tablet Take 325 mg by mouth every 4 (four) hours as needed. For pain/headache/fever     [provider]  amiodarone (PACERONE) 100 MG tablet Take 1 tablet (100 mg total) daily by mouth. 03/29/17   Barton Dubois, MD  amLODipine (NORVASC) 10 MG tablet Take 10 mg by mouth daily.      [provider]  apixaban (ELIQUIS) 5 MG TABS tablet Take 5 mg by mouth 2 (two) times daily.    [provider]  baclofen (LIORESAL) 10 MG tablet Take 10 mg by mouth 3 (three) times daily.      [provider]  benazepril (LOTENSIN) 20 MG tablet Take 20 mg by mouth at bedtime.     [provider]  calcium carbonate (OSCAL) 1500 (600 Ca) MG TABS tablet Take 1,200 mg of elemental calcium by mouth daily with breakfast.    [provider]  calcium carbonate (TUMS - DOSED IN MG ELEMENTAL CALCIUM) 500 MG chewable tablet Chew 2 tablets by mouth as needed for indigestion or heartburn.    [provider]  carboxymethylcellulose (REFRESH PLUS) 0.5 % SOLN Place 1 drop into both eyes 4 (four) times daily as needed (dry eyes).    [provider]  cholecalciferol (VITAMIN D) 1000 units tablet Take 1,000 Units by mouth daily.    [provider]  furosemide (LASIX) 80 MG tablet Take 80 mg by mouth every morning.      [provider]  gabapentin (NEURONTIN) 100 MG capsule Take 200 mg 4 (four) times daily by mouth.    [provider]  guaiFENesin (MUCINEX) 600 MG 12 hr tablet Take 600 mg by mouth 2 (two) times daily as needed for cough.    [provider]  levothyroxine (SYNTHROID, LEVOTHROID) 50 MCG tablet Take 50 mcg by mouth daily.      [provider]  loperamide (IMODIUM) 2 MG capsule Take 2 mg by mouth 4 (four) times daily as  needed. For loose stools/diarrhea     [provider]  menthol-zinc oxide (GOLD BOND) powder Apply 1 application topically daily.    [provider]  Multiple Vitamins-Minerals (MULTIVITAMINS THER. W/MINERALS) TABS Take 1 tablet by mouth daily.      [provider]  OXYQUINOLONE SULFATE VAGINAL (TRIMO-SAN) 0.025 % GEL Place 1 application vaginally at bedtime. On Tuesday and friday    [provider]  polyethylene glycol (MIRALAX / GLYCOLAX) packet Take 17 g by mouth daily as needed. For constipation.     [provider]  potassium chloride SA (K-DUR,KLOR-CON) 20 MEQ tablet Take 20 mEq by mouth daily.      [provider]  Skin Protectants, Misc. (EUCERIN) cream Apply 1 application as needed topically for dry skin.    [provider]  tamoxifen (NOLVADEX) 10 MG tablet Take 10 mg by mouth daily.      [provider]  tolnaftate (TINACTIN) 1 % powder Apply 1 application topically as needed for itching.    [provider]  traMADol (ULTRAM) 50 MG tablet Take 50 mg by mouth every 6 (six) hours as needed for moderate pain.    [provider]  venlafaxine (EFFEXOR-XR) 150 MG 24 hr capsule Take 150 mg by mouth daily.      [provider]    Family History Family History  Problem Relation Age of Onset  . Hypertension Other     Social History Social History   Tobacco Use  . Smoking status: Never Smoker  . Smokeless tobacco: Never Used  Substance Use Topics  . Alcohol use: No  . Drug use: No     Allergies   Sulfa antibiotics   Review of Systems Review of Systems  Constitutional:       Per HPI, otherwise negative  HENT:       Per HPI, otherwise negative  Respiratory:       Per HPI, otherwise negative  Cardiovascular:       Per HPI, otherwise negative  Gastrointestinal: Positive for vomiting. Negative for abdominal pain.  Endocrine:       Negative aside from HPI  Genitourinary:        Neg aside from HPI   Musculoskeletal:       Per HPI, otherwise negative  Skin: Negative.   Neurological: Positive for weakness and headaches. Negative for syncope.     Physical Exam Updated Vital Signs BP (!) 156/69 (BP Location: Left Arm)   Pulse (!) 57   Temp 98 F (36.7 C) (Oral)   Resp 16   Ht 5' (1.524 m)   Wt 77.1 kg (170 lb)   SpO2 91%   BMI 33.20 kg/m   Physical Exam  Constitutional: She is oriented to person,  place, and time. She has a sickly appearance. No distress.  HENT:  Head: Normocephalic and atraumatic.  Eyes: Conjunctivae and EOM are normal.  Cardiovascular: Normal rate and regular rhythm.  Pulmonary/Chest: Effort normal and breath sounds normal. No stridor. No respiratory distress.  Abdominal: She exhibits no distension. There is no tenderness. There is no guarding.  Musculoskeletal: She exhibits no edema.  No gross deformity. Patient has chronic lymphedematous changes in both lower extremities.   Neurological: She is alert and oriented to person, place, and time. She displays atrophy. She displays no tremor. No cranial nerve deficit. She exhibits normal muscle tone. She displays no seizure activity.  Skin: Skin is warm and dry.  Psychiatric: Her speech is delayed. She is slowed.  Nursing note and vitals reviewed.    ED Treatments / Results  Labs (all labs ordered are listed, but only abnormal results are displayed) Labs Reviewed  CBC  URINALYSIS, ROUTINE W REFLEX MICROSCOPIC  COMPREHENSIVE METABOLIC PANEL  LIPASE, BLOOD  BRAIN NATRIURETIC PEPTIDE    EKG  EKG Interpretation  Date/Time:  Monday April 22 2017 09:07:12 EST Ventricular Rate:  61 PR Interval:    QRS Duration: 116 QT Interval:  456 QTC Calculation: 460 R Axis:   83 Text Interpretation:  Sinus rhythm low amplitude p-waves, but similar to prior T wave abnormality Non-specific intra-ventricular conduction delay Abnormal ekg Confirmed by Carmin Muskrat [phone]) on 04/22/2017  9:26:27 AM       Radiology Dg Chest 2 View  Result Date: 04/22/2017 CLINICAL DATA:  83 year old female with headache and vomiting this morning. Hypoxia despite supplemental oxygen and complaining of right side shortness of breath. EXAM: CHEST  2 VIEW COMPARISON:  03/27/2017 and earlier. FINDINGS: Semi upright AP and lateral views of the chest. Lower lung volumes on the lateral view. Seen on only the frontal view there is mild patchy opacity at the right lung base. No other confluent pulmonary opacity. Stable mediastinal contours. Visualized tracheal air column is within normal limits. No pneumothorax, pulmonary edema or pleural effusion. On the lateral view there is an air-fluid level in a distended probably large bowel loop. No acute osseous abnormality identified. IMPRESSION: 1. Mild right lung base patchy opacity is nonspecific. Consider Aspiration in this clinical setting. 2. No other acute cardiopulmonary abnormality. 3. Gas and fluid dilated bowel loop visible on the lateral view. Recommend Acute Abdominal Series. Electronically Signed   By: Genevie Ann M.D.   On: 04/22/2017 11:06   Ct Head Wo Contrast  Result Date: 04/22/2017 CLINICAL DATA:  Dizziness and headache. EXAM: CT HEAD WITHOUT CONTRAST TECHNIQUE: Contiguous axial images were obtained from the base of the skull through the vertex without intravenous contrast. COMPARISON:  10/26/2016 FINDINGS: Brain: The brainstem, cerebellum, cerebral peduncles, thalami, basal ganglia, basilar cisterns, and ventricular system appear within normal limits. Periventricular white matter and corona radiata hypodensities favor chronic ischemic microvascular white matter disease. No intracranial hemorrhage, mass lesion, or acute CVA. Vascular: Unremarkable Skull: Unremarkable Sinuses/Orbits: Unremarkable Other: No supplemental non-categorized findings. IMPRESSION: 1. No acute intracranial findings. 2. Periventricular white matter and corona radiata hypodensities  favor chronic ischemic microvascular white matter disease. Electronically Signed   By: Van Clines M.D.   On: 04/22/2017 10:19    Procedures Procedures (including critical care time)  Medications Ordered in ED Medications  ondansetron (ZOFRAN-ODT) disintegrating tablet 4 mg (not administered)  0.9 %  sodium chloride infusion ( Intravenous New Bag/Given 04/22/17 1135)  ceFEPIme (MAXIPIME) 1 g in dextrose 5 % 50 mL  IVPB (not administered)  vancomycin (VANCOCIN) 1,500 mg in sodium chloride 0.9 % 500 mL IVPB (not administered)  albuterol (PROVENTIL) (2.5 MG/3ML) 0.083% nebulizer solution 5 mg (5 mg Nebulization Given 04/22/17 1132)  metoCLOPramide (REGLAN) injection 5 mg (5 mg Intravenous Given 04/22/17 1152)     Initial Impression / Assessment and Plan / ED Course  I have reviewed the triage vital signs and the nursing notes.  Pertinent labs & imaging results that were available during my care of the patient were reviewed by me and considered in my medical decision making (see chart for details).  10:42 AM Initial labs reassuring, but soon after the initial evaluation, though the patient came in with no hypoxia, increased work of breathing, she developed some respiratory decline, with requirement of 5 L nasal cannula to maintain 92% saturation. Breath sounds became more coarse, there is suspicion for aspiration. Patient has no documented history of congestive heart failure, does have lower extremity edema, but she states that this is unchanged. Heart failure is less likely.   11:55 AM Patient tolerating albuterol well. She has received broad-spectrum antibiotics. The patient came initially with concern of vomiting, and headache, which are both acute on chronic complaints, there is no evidence for neurologic dysfunction, nor substantial intracranial abnormalities, she developed respiratory difficulty, concerning for aspiration pneumonia. Patient required supplemental oxygen,  antibiotics, was admitted for further evaluation and management. Final Clinical Impressions(s) / ED Diagnoses  Healthcare acquired pneumonia Nausea and vomiting Bad headache   Carmin Muskrat, MD 04/22/17 1156

## 2017-04-22 NOTE — H&P (Signed)
History and Physical    [name] NLG:[id number] DOB: Jun 01, 1930 DOA: 04/22/2017  PCP: [name], MD  [facility]  Chief Complaint: Headache, sob  HPI: [name] is a 83 y.o. female with medical history significant of atrial fibrillation, breast cancer, fibromyalgia, anxiety, hypertension, hyperlipidemia presents to the emergency department with concerns of headache and nausea and vomiting.  Denies any focal weakness or loss of consciousness.  Denies fevers or chills.  Denies vision changes  ED Course: In the emergency department, patient underwent head CT which was found to be unremarkable for acute process.  Patient was given analgesics in the emergency department with improvement of headache.  On reevaluation, patient noted to have acute increased work of breathing and acute hypoxemic respiratory failure requiring 5 L nasal cannula.  X-ray was performed with findings suggestive of aspiration pneumonia.  Patient was started on empiric antibiotics.  Hospitalist service consulted for consideration for admission.  Review of Systems:  Review of Systems  Constitutional: Positive for malaise/fatigue. Negative for chills and weight loss.  HENT: Negative for ear discharge, ear pain, nosebleeds, sinus pain and tinnitus.        Headache  Eyes: Negative for double vision, photophobia and pain.  Respiratory: Positive for cough and shortness of breath. Negative for hemoptysis and sputum production.   Cardiovascular: Negative for palpitations, orthopnea and claudication.  Gastrointestinal: Positive for nausea and vomiting. Negative for blood in stool and constipation.  Genitourinary: Negative for frequency, hematuria and urgency.  Musculoskeletal: Negative for back pain, joint pain and neck pain.  Neurological: Positive for headaches. Negative for tremors, sensory change, speech change, seizures and loss of consciousness.  Psychiatric/Behavioral:  Negative for hallucinations, memory loss and substance abuse. The patient does not have insomnia.     Past Medical History:  Diagnosis Date  . Anxiety   . Atrial fibrillation ([place])   . Breast CA ([place])   . Breast cancer ([place])   . Constipation   . Depression   . Disorder of bone density and structure, unspecified   . Fibromyalgia   . Hyperkalemia   . Hyperlipemia   . Hypertension   . Impaired fasting glucose   . Retention of urine, unspecified   . Scoliosis   . Thyroid disease     Past Surgical History:  Procedure Laterality Date  . FRACTURE SURGERY    . HIP FRACTURE SURGERY       reports that  has never smoked. she has never used smokeless tobacco. She reports that she does not drink alcohol or use drugs.  Allergies  Allergen Reactions  . Sulfa Antibiotics Other (See Comments)    Per mar    Family History  Problem Relation Age of Onset  . Hypertension Other     Prior to Admission medications   Medication Sig Start Date End Date Taking? Authorizing Provider  acetaminophen (TYLENOL) 500 MG tablet Take 500 mg by mouth every 4 (four) hours.   Yes [provider]  amiodarone (PACERONE) 100 MG tablet Take 1 tablet (100 mg total) daily by mouth. 03/29/17  Yes Barton Dubois, MD  amLODipine (NORVASC) 10 MG tablet Take 10 mg by mouth daily.     Yes [provider]  apixaban (ELIQUIS) 5 MG TABS tablet Take 5 mg by mouth 2 (two) times daily.   Yes [provider]  baclofen (LIORESAL) 10 MG tablet Take 10 mg by mouth 3 (three) times daily.     Yes [provider]  benazepril (LOTENSIN) 20 MG tablet Take 20 mg by mouth at bedtime.    Yes [provider]  calcium carbonate (TUMS - DOSED IN MG ELEMENTAL CALCIUM) 500 MG chewable tablet Chew 2 tablets by mouth as needed for indigestion or heartburn.   Yes [provider]  carboxymethylcellulose (REFRESH PLUS) 0.5 % SOLN Place 1 drop into both eyes 4 (four) times daily as needed  (dry eyes).   Yes [provider]  cholecalciferol (VITAMIN D) 1000 units tablet Take 1,000 Units by mouth daily.   Yes [provider]  furosemide (LASIX) 80 MG tablet Take 80 mg by mouth every morning.     Yes [provider]  gabapentin (NEURONTIN) 100 MG capsule Take 200 mg 4 (four) times daily by mouth.   Yes [provider]  guaiFENesin (MUCINEX) 600 MG 12 hr tablet Take 600 mg by mouth 2 (two) times daily as needed for cough.   Yes [provider]  levothyroxine (SYNTHROID, LEVOTHROID) 50 MCG tablet Take 50 mcg by mouth daily.     Yes [provider]  loperamide (IMODIUM) 2 MG capsule Take 2 mg by mouth 4 (four) times daily as needed. For loose stools/diarrhea    Yes [provider]  menthol-zinc oxide (GOLD BOND) powder Apply 1 application topically daily.   Yes [provider]  Multiple Vitamins-Minerals (MULTIVITAMINS THER. W/MINERALS) TABS Take 1 tablet by mouth daily.     Yes [provider]  OXYGEN Inhale 1 L into the lungs continuous.   Yes [provider]  OXYQUINOLONE SULFATE VAGINAL (TRIMO-SAN) 0.025 % GEL Place 1 application vaginally at bedtime. On Tuesday and friday   Yes [provider]  polyethylene glycol (MIRALAX / GLYCOLAX) packet Take 17 g by mouth daily as needed. For constipation.    Yes [provider]  potassium chloride SA (K-DUR,KLOR-CON) 20 MEQ tablet Take 20 mEq by mouth daily.     Yes [provider]  tamoxifen (NOLVADEX) 10 MG tablet Take 10 mg by mouth daily.     Yes [provider]  tolnaftate (TINACTIN) 1 % powder Apply 1 application topically as needed for itching.   Yes [provider]  traMADol (ULTRAM) 50 MG tablet Take 50 mg by mouth every 6 (six) hours as needed for moderate pain.   Yes [provider]  venlafaxine (EFFEXOR-XR) 150 MG 24 hr capsule Take 150 mg by mouth daily.     Yes [provider]    acetaminophen (TYLENOL) 325 MG tablet Take 325 mg by mouth every 4 (four) hours as needed. For pain/headache/fever     [provider]    Physical Exam: Vitals:   04/22/17 1030 04/22/17 1035 04/22/17 1100 04/22/17 1130  BP: (!) 160/68 (!) 160/68 140/62 (!) 160/81  Pulse: 64 70 65 63  Resp: 18 17  18   Temp:      TempSrc:      SpO2: 92% 91% 94% 94%  Weight:      Height:        Constitutional: NAD, calm, comfortable Vitals:   04/22/17 1030 04/22/17 1035 04/22/17 1100 04/22/17 1130  BP: (!) 160/68 (!) 160/68 140/62 (!) 160/81  Pulse: 64 70 65 63  Resp: 18 17  18   Temp:      TempSrc:      SpO2: 92% 91% 94% 94%  Weight:      Height:       Eyes: PERRL, lids and conjunctivae normal ENMT:  Mucous membranes are moist. Posterior pharynx clear of any exudate or lesions.Normal dentition.  Neck: normal, supple, no masses, no thyromegaly Respiratory: Increased respiratory rate, good breath sounds in all lung fields, no audible wheezing Cardiovascular: Regular rate and rhythm, no murmurs / rubs / gallops. No extremity edema. 2+ pedal pulses. No carotid bruits.  Abdomen: no tenderness, no masses palpated. No hepatosplenomegaly. Bowel sounds positive.  Musculoskeletal: no clubbing / cyanosis. No joint deformity upper and lower extremities. Good ROM, no contractures. Normal muscle tone.  Skin: no rashes, lesions, ulcers. No induration Neurologic: CN 2-12 grossly intact. Sensation intact, DTR normal. Strength 5/5 in all 4.  Psychiatric: Normal judgment and insight. Alert and oriented x 3. Normal mood.    Labs on Admission: I have personally reviewed following labs and imaging studies  CBC: Recent Labs  Lab 04/22/17 0920  WBC 7.2  HGB 12.4  HCT 38.8  MCV 90.7  PLT 790   Basic Metabolic Panel: No results for input(s): NA, K, CL, CO2, GLUCOSE, BUN, CREATININE, CALCIUM, MG, PHOS in the last 168 hours. GFR: CrCl cannot be calculated (Patient's most recent lab result is older  than the maximum 21 days allowed.). Liver Function Tests: No results for input(s): AST, ALT, ALKPHOS, BILITOT, PROT, ALBUMIN in the last 168 hours. No results for input(s): LIPASE, AMYLASE in the last 168 hours. No results for input(s): AMMONIA in the last 168 hours. Coagulation Profile: No results for input(s): INR, PROTIME in the last 168 hours. Cardiac Enzymes: No results for input(s): CKTOTAL, CKMB, CKMBINDEX, TROPONINI in the last 168 hours. BNP (last 3 results) No results for input(s): PROBNP in the last 8760 hours. HbA1C: No results for input(s): HGBA1C in the last 72 hours. CBG: No results for input(s): GLUCAP in the last 168 hours. Lipid Profile: No results for input(s): CHOL, HDL, LDLCALC, TRIG, CHOLHDL, LDLDIRECT in the last 72 hours. Thyroid Function Tests: No results for input(s): TSH, T4TOTAL, FREET4, T3FREE, THYROIDAB in the last 72 hours. Anemia Panel: No results for input(s): VITAMINB12, FOLATE, FERRITIN, TIBC, IRON, RETICCTPCT in the last 72 hours. Urine analysis:    Component Value Date/Time   COLORURINE YELLOW 04/22/2017 [place] 04/22/2017 0819   LABSPEC 1.012 04/22/2017 0819   PHURINE 5.0 04/22/2017 0819   GLUCOSEU NEGATIVE 04/22/2017 0819   HGBUR NEGATIVE 04/22/2017 0819   BILIRUBINUR NEGATIVE 04/22/2017 0819   KETONESUR NEGATIVE 04/22/2017 0819   PROTEINUR NEGATIVE 04/22/2017 0819   UROBILINOGEN 1.0 03/07/2011 0044   NITRITE NEGATIVE 04/22/2017 0819   LEUKOCYTESUR NEGATIVE 04/22/2017 0819   Sepsis Labs: !!!!!!!!!!!!!!!!!!!!!!!!!!!!!!!!!!!!!!!!!!!! @LABRCNTIP (procalcitonin:4,lacticidven:4) )No results found for this or any previous visit (from the past 240 hour(s)).   Radiological Exams on Admission: Dg Chest 2 View  Result Date: 04/22/2017 CLINICAL DATA:  83 year old female with headache and vomiting this morning. Hypoxia despite supplemental oxygen and complaining of right side shortness of breath. EXAM: CHEST  2 VIEW COMPARISON:   03/27/2017 and earlier. FINDINGS: Semi upright AP and lateral views of the chest. Lower lung volumes on the lateral view. Seen on only the frontal view there is mild patchy opacity at the right lung base. No other confluent pulmonary opacity. Stable mediastinal contours. Visualized tracheal air column is within normal limits. No pneumothorax, pulmonary edema or pleural effusion. On the lateral view there is an air-fluid level in a distended probably large bowel loop. No acute osseous abnormality identified. IMPRESSION: 1. Mild right lung base patchy opacity is nonspecific. Consider Aspiration in this clinical setting. 2. No  other acute cardiopulmonary abnormality. 3. Gas and fluid dilated bowel loop visible on the lateral view. Recommend Acute Abdominal Series. Electronically Signed   By: Genevie Ann M.D.   On: 04/22/2017 11:06   Ct Head Wo Contrast  Result Date: 04/22/2017 CLINICAL DATA:  Dizziness and headache. EXAM: CT HEAD WITHOUT CONTRAST TECHNIQUE: Contiguous axial images were obtained from the base of the skull through the vertex without intravenous contrast. COMPARISON:  10/26/2016 FINDINGS: Brain: The brainstem, cerebellum, cerebral peduncles, thalami, basal ganglia, basilar cisterns, and ventricular system appear within normal limits. Periventricular white matter and corona radiata hypodensities favor chronic ischemic microvascular white matter disease. No intracranial hemorrhage, mass lesion, or acute CVA. Vascular: Unremarkable Skull: Unremarkable Sinuses/Orbits: Unremarkable Other: No supplemental non-categorized findings. IMPRESSION: 1. No acute intracranial findings. 2. Periventricular white matter and corona radiata hypodensities favor chronic ischemic microvascular white matter disease. Electronically Signed   By: Van Clines M.D.   On: 04/22/2017 10:19    EKG: Independently reviewed. Sinus, QTc 460  Assessment/Plan Principal Problem:   Respiratory failure with hypoxia (HCC) Active  Problems:   Hypertension   Paroxysmal atrial fibrillation (HCC)   Hypothyroidism   Aspiration pneumonia ([place])   1. Acute hypoxemic respiratory failure 1. New O2 requirements of 5LNC while in ED associated with N/V 2. CXR reviewed, findings suggestive of PNA 3. Will cont pt on zosyn to cover asp PNA 4. Repeat CBC in AM 5. Wean O2 as tolerated 2. Aspiration PNA 1. Per above, cont zosyn 3. Headache 1. Head CT reviewed, stable 2. Continue tylenol PRN 4. Hypthyroid 1. Will check TSH 2. Cont thyroid replacement 5. PAF 1. Currently rate controlled 2. On chronic anticoagulation, continue 6. Chronic anticoagulation 1. Per above, continue eliquis as tolerated 7. HTN 1. bp stable 2. Cont home meds   DVT prophylaxis: Eliquis  Code Status: DNR, confirmed in ED Family Communication: Pt in room  Disposition Plan: Uncertain at this time  Consults called:  Admission status: Inpatient as would require 2 midnight stay for IV abx for asp pna   Marylu Lund MD [facility] Pager [phone]  If 7PM-7AM, please contact night-coverage [email or web address] Password [email or web address]  04/22/2017, 11:58 AM

## 2017-04-23 ENCOUNTER — Inpatient Hospital Stay (HOSPITAL_COMMUNITY): Payer: Medicare Other

## 2017-04-23 DIAGNOSIS — L8990 Pressure ulcer of unspecified site, unspecified stage: Secondary | ICD-10-CM

## 2017-04-23 DIAGNOSIS — R112 Nausea with vomiting, unspecified: Secondary | ICD-10-CM

## 2017-04-23 LAB — CBC
HEMATOCRIT: 42.2 % (ref 36.0–46.0)
HEMOGLOBIN: 13.3 g/dL (ref 12.0–15.0)
MCH: 28.5 pg (ref 26.0–34.0)
MCHC: 31.5 g/dL (ref 30.0–36.0)
MCV: 90.4 fL (ref 78.0–100.0)
Platelets: 216 10*3/uL (ref 150–400)
RBC: 4.67 MIL/uL (ref 3.87–5.11)
RDW: 14.7 % (ref 11.5–15.5)
WBC: 12.1 10*3/uL — AB (ref 4.0–10.5)

## 2017-04-23 LAB — COMPREHENSIVE METABOLIC PANEL
ALBUMIN: 3.5 g/dL (ref 3.5–5.0)
ALK PHOS: 45 U/L (ref 38–126)
ALT: 12 U/L — ABNORMAL LOW (ref 14–54)
ANION GAP: 11 (ref 5–15)
AST: 17 U/L (ref 15–41)
BILIRUBIN TOTAL: 1.2 mg/dL (ref 0.3–1.2)
BUN: 9 mg/dL (ref 6–20)
CALCIUM: 8 mg/dL — AB (ref 8.9–10.3)
CO2: 28 mmol/L (ref 22–32)
Chloride: 103 mmol/L (ref 101–111)
Creatinine, Ser: 0.46 mg/dL (ref 0.44–1.00)
Glucose, Bld: 110 mg/dL — ABNORMAL HIGH (ref 65–99)
POTASSIUM: 2.7 mmol/L — AB (ref 3.5–5.1)
Sodium: 142 mmol/L (ref 135–145)
TOTAL PROTEIN: 6.8 g/dL (ref 6.5–8.1)

## 2017-04-23 LAB — TSH: TSH: 2.575 u[IU]/mL (ref 0.350–4.500)

## 2017-04-23 LAB — MAGNESIUM: Magnesium: 2 mg/dL (ref 1.7–2.4)

## 2017-04-23 MED ORDER — POTASSIUM CHLORIDE 10 MEQ/100ML IV SOLN
10.0000 meq | INTRAVENOUS | Status: AC
  Administered 2017-04-23 (×4): 10 meq via INTRAVENOUS
  Filled 2017-04-23 (×5): qty 100

## 2017-04-23 MED ORDER — POTASSIUM CHLORIDE 20 MEQ/15ML (10%) PO SOLN
40.0000 meq | Freq: Once | ORAL | Status: AC
  Administered 2017-04-23: 40 meq via ORAL
  Filled 2017-04-23 (×2): qty 30

## 2017-04-23 MED ORDER — TRAMADOL HCL 50 MG PO TABS
50.0000 mg | ORAL_TABLET | Freq: Every day | ORAL | Status: DC
  Administered 2017-04-23 – 2017-04-24 (×2): 50 mg via ORAL
  Filled 2017-04-23 (×2): qty 1

## 2017-04-23 NOTE — Progress Notes (Signed)
PHARMACY NOTE -  zosyn  Pharmacy has been assisting with dosing of zosyn for asp PNA. Dosage remains stable at 3.375 gm IV q8h (infuse over 4 hrs) and need for further dosage adjustment appears unlikely at present.    Will sign off at this time.  Please reconsult if a change in clinical status warrants re-evaluation of dosage.  Dia Sitter, PharmD, BCPS 04/23/2017 11:04 AM

## 2017-04-23 NOTE — Progress Notes (Signed)
PROGRESS NOTE    [name]  IPJ:[id number] DOB: 18-Apr-1931 DOA: 04/22/2017 PCP: [name], MD    Brief Narrative: [name] is a 83 y.o. female with medical history significant of atrial fibrillation, breast cancer, fibromyalgia, anxiety, hypertension, hyperlipidemia presents to the emergency department with concerns of headache and nausea and vomiting. CXR showed right lung opacity showing aspiration pneumonia.SLP eval recommended dysphagia 3 diet.     Assessment & Plan:   Principal Problem:   Respiratory failure with hypoxia ([place]) Active Problems:   Hypertension   Paroxysmal atrial fibrillation (HCC)   Hypothyroidism   Aspiration pneumonia (HCC)   Acute hypoxemic respiratory failure (HCC)   Pressure injury of skin   Acute respiratory failure with hypoxia secondary to aspiration pneumonia, initially requiring 5lit , now down to 2 lit of [place] oxygen.  Started on zosyn, SLP eval recommended dysphagia 3 diet.  Blood cultures not done on admission.    Hypertension:  Well controlled.    PAF: Resume eliquis , rate controlled.    Hypothyroidism: Resume synthroid.    Pressure ulcer of the skin: Wound care consulted for recommendations.    Hypokalemia: replace as needed.    DVT prophylaxis: eliquis Code Status:  DNR.  Family Communication: none at bedside.  Disposition Plan: PENDING RESOLUTION OF PNA.    Consultants:   SLP.    Procedures: none.    Antimicrobials: zosyn since admission.    Subjective: Not feeling good in general.  No chest pain or sob.  On 2 lit of [place] oxygen.   Objective: Vitals:   04/22/17 2020 04/22/17 2339 04/23/17 0513 04/23/17 1608  BP: (!) 147/67 (!) 155/73 (!) 151/70 (!) 134/59  Pulse: 75  63 (!) 55  Resp: 16  16 12   Temp: 98.2 F (36.8 C)  98.7 F (37.1 C) 98 F (36.7 C)  TempSrc: Oral  Oral Oral  SpO2: 97%  96% 92%  Weight:      Height:        Intake/Output Summary (Last 24 hours) at 04/23/2017  1721 Last data filed at 04/23/2017 1434 Gross per 24 hour  Intake 4284.58 ml  Output 2950 ml  Net 1334.58 ml   Filed Weights   04/22/17 0814 04/22/17 1836  Weight: 77.1 kg (170 lb) 76.3 kg (168 lb 3.4 oz)    Examination:  General exam: Appears calm and comfortable  Respiratory system: Clear to auscultation. Respiratory effort normal. Cardiovascular system: S1 & S2 heard, RRR. No JVD,  No pedal edema. Gastrointestinal system: Abdomen is nondistended, soft and nontender. No organomegaly or masses felt. Normal bowel sounds heard. Central nervous system: slightly confused, non focal.  Extremities: Symmetric 5 x 5 power. No cyanosis or clubbing.  Skin: No rashes, lesions or ulcers Psychiatry:  Mood & affect appropriate.     Data Reviewed: I have personally reviewed following labs and imaging studies  CBC: Recent Labs  Lab 04/22/17 0920 04/23/17 0458  WBC 7.2 12.1*  HGB 12.4 13.3  HCT 38.8 42.2  MCV 90.7 90.4  PLT 229 734   Basic Metabolic Panel: Recent Labs  Lab 04/22/17 1129 04/23/17 0458  NA 140 142  K 4.6 2.7*  CL 105 103  CO2 28 28  GLUCOSE 121* 110*  BUN 13 9  CREATININE 0.65 0.46  CALCIUM 8.2* 8.0*  MG  --  2.0   GFR: Estimated Creatinine Clearance: 47.8 mL/min (by C-G formula based on SCr of 0.46 mg/dL). Liver Function Tests: Recent Labs  Lab 04/22/17  1129 04/23/17 0458  AST 32 17  ALT 12* 12*  ALKPHOS 41 45  BILITOT 1.7* 1.2  PROT 6.7 6.8  ALBUMIN 3.6 3.5   Recent Labs  Lab 04/22/17 1129  LIPASE 31   No results for input(s): AMMONIA in the last 168 hours. Coagulation Profile: No results for input(s): INR, PROTIME in the last 168 hours. Cardiac Enzymes: No results for input(s): CKTOTAL, CKMB, CKMBINDEX, TROPONINI in the last 168 hours. BNP (last 3 results) No results for input(s): PROBNP in the last 8760 hours. HbA1C: No results for input(s): HGBA1C in the last 72 hours. CBG: No results for input(s): GLUCAP in the last 168  hours. Lipid Profile: No results for input(s): CHOL, HDL, LDLCALC, TRIG, CHOLHDL, LDLDIRECT in the last 72 hours. Thyroid Function Tests: Recent Labs    04/23/17 0458  TSH 2.575   Anemia Panel: No results for input(s): VITAMINB12, FOLATE, FERRITIN, TIBC, IRON, RETICCTPCT in the last 72 hours. Sepsis Labs: No results for input(s): PROCALCITON, LATICACIDVEN in the last 168 hours.  No results found for this or any previous visit (from the past 240 hour(s)).       Radiology Studies: Dg Chest 2 View  Result Date: 04/22/2017 CLINICAL DATA:  83 year old female with headache and vomiting this morning. Hypoxia despite supplemental oxygen and complaining of right side shortness of breath. EXAM: CHEST  2 VIEW COMPARISON:  03/27/2017 and earlier. FINDINGS: Semi upright AP and lateral views of the chest. Lower lung volumes on the lateral view. Seen on only the frontal view there is mild patchy opacity at the right lung base. No other confluent pulmonary opacity. Stable mediastinal contours. Visualized tracheal air column is within normal limits. No pneumothorax, pulmonary edema or pleural effusion. On the lateral view there is an air-fluid level in a distended probably large bowel loop. No acute osseous abnormality identified. IMPRESSION: 1. Mild right lung base patchy opacity is nonspecific. Consider Aspiration in this clinical setting. 2. No other acute cardiopulmonary abnormality. 3. Gas and fluid dilated bowel loop visible on the lateral view. Recommend Acute Abdominal Series. Electronically Signed   By: Genevie Ann M.D.   On: 04/22/2017 11:06   Ct Head Wo Contrast  Result Date: 04/22/2017 CLINICAL DATA:  Dizziness and headache. EXAM: CT HEAD WITHOUT CONTRAST TECHNIQUE: Contiguous axial images were obtained from the base of the skull through the vertex without intravenous contrast. COMPARISON:  10/26/2016 FINDINGS: Brain: The brainstem, cerebellum, cerebral peduncles, thalami, basal ganglia, basilar  cisterns, and ventricular system appear within normal limits. Periventricular white matter and corona radiata hypodensities favor chronic ischemic microvascular white matter disease. No intracranial hemorrhage, mass lesion, or acute CVA. Vascular: Unremarkable Skull: Unremarkable Sinuses/Orbits: Unremarkable Other: No supplemental non-categorized findings. IMPRESSION: 1. No acute intracranial findings. 2. Periventricular white matter and corona radiata hypodensities favor chronic ischemic microvascular white matter disease. Electronically Signed   By: Van Clines M.D.   On: 04/22/2017 10:19        Scheduled Meds: . amiodarone  100 mg Oral Daily  . apixaban  5 mg Oral BID  . enalaprilat  1.25 mg Intravenous Q6H  . furosemide  80 mg Oral Daily  . gabapentin  200 mg Oral QID  . levothyroxine  50 mcg Oral QAC breakfast  . potassium chloride  40 mEq Oral Once  . tamoxifen  10 mg Oral Daily  . traMADol  50 mg Oral Q0600  . venlafaxine XR  150 mg Oral Daily   Continuous Infusions: . sodium chloride 125 mL/hr  at 04/22/17 1135  . sodium chloride 75 mL/hr at 04/22/17 2021  . piperacillin-tazobactam (ZOSYN)  IV Stopped (04/23/17 1434)     LOS: 1 day    Time spent: 35 MINUTES.     Hosie Poisson, MD [facility] Pager [phone] If 7PM-7AM, please contact night-coverage [email or web address] Password [email or web address] 04/23/2017, 5:21 PM

## 2017-04-23 NOTE — Care Management Important Message (Signed)
Important Message  Patient Details  Name: LORRANE MCCAY MRN: [id number] Date of Birth: 1930/07/04   Medicare Important Message Given:  Yes    Kerin Salen 04/23/2017, 1:12 PMImportant Message  Patient Details  Name: KEYUNA CUTHRELL MRN: [id number] Date of Birth: 1930/08/03   Medicare Important Message Given:  Yes    Kerin Salen 04/23/2017, 1:12 PM

## 2017-04-23 NOTE — Progress Notes (Signed)
  Speech Language Pathology Treatment: Dysphagia  Patient Details Name: [name] MRN: [id number] DOB: [birth year] Today's Date: 04/23/2017 Time: 0349-1791 SLP Time Calculation (min) (ACUTE ONLY): 10 min  Assessment / Plan / Recommendation Clinical Impression  Pt declined to consume po with this SLP stating her mashed potatoes "went right through her".  Pt denies dysphagia nor reflux.  She admits that she takes Tums every morning to keep her from becoming nauseous or vomiting.  Advised RN to pt's report.    Of note, pt has h/o N[place] approximately 10 years ago but she denies this.  Voice is hoarse at this time, suspect this is due to her vomiting.    RN reports pt tolerating po intake she will accept.  Given poor dentition, recommend to continue dys3/thin diet.    SLP advised pt to aspiration precautions, no SLP follow up indicated at this time.     HPI HPI: 83 year old female admitted from SNF with NCAP, nausea/vomiting (daily), headache. CXR 12/10: Mild right lung base patchy opacity is nonspecific.   Pt has been seen for clinical evaluation yesterday - today seen to assess tolerance of po and indication for instrumental evaluation.  Pt's RN reports pt tolerating po intake of mashed potatoes and liquids.  Pt however stated mashed potatoes went right through her.         SLP Plan  All goals met       Recommendations  Diet recommendations: Dysphagia 3 (mechanical soft);Thin liquid(due to poor dentition) Medication Administration: Whole meds with liquid Supervision: Patient able to self feed Compensations: Slow rate;Small sips/bites Postural Changes and/or Swallow Maneuvers: Seated upright 90 degrees;Upright 30-60 min after meal                Oral Care Recommendations: Oral care BID Follow up Recommendations: None SLP Visit Diagnosis: Dysphagia, unspecified (R13.10) Plan: All goals met       GO               Luanna Salk, MS [facility] SLP [phone]  Macario Golds 04/23/2017, 3:23 PM

## 2017-04-23 NOTE — Progress Notes (Signed)
CRITICAL VALUE ALERT  Critical Value:  K+ 2.7  Date & Time Notied:  04/23/2017 0620  Provider Notified: Lamar Blinks, NP

## 2017-04-24 DIAGNOSIS — J690 Pneumonitis due to inhalation of food and vomit: Principal | ICD-10-CM

## 2017-04-24 DIAGNOSIS — R51 Headache: Secondary | ICD-10-CM

## 2017-04-24 DIAGNOSIS — E039 Hypothyroidism, unspecified: Secondary | ICD-10-CM

## 2017-04-24 DIAGNOSIS — I480 Paroxysmal atrial fibrillation: Secondary | ICD-10-CM

## 2017-04-24 DIAGNOSIS — L89611 Pressure ulcer of right heel, stage 1: Secondary | ICD-10-CM

## 2017-04-24 DIAGNOSIS — J9601 Acute respiratory failure with hypoxia: Secondary | ICD-10-CM

## 2017-04-24 LAB — C DIFFICILE QUICK SCREEN W PCR REFLEX
C Diff antigen: NEGATIVE
C Diff interpretation: NOT DETECTED
C Diff toxin: NEGATIVE

## 2017-04-24 LAB — BASIC METABOLIC PANEL
Anion gap: 7 (ref 5–15)
BUN: 9 mg/dL (ref 6–20)
CALCIUM: 7.2 mg/dL — AB (ref 8.9–10.3)
CO2: 27 mmol/L (ref 22–32)
CREATININE: 0.47 mg/dL (ref 0.44–1.00)
Chloride: 105 mmol/L (ref 101–111)
GFR calc non Af Amer: >60 mL/min (ref >60)
GLUCOSE: 96 mg/dL (ref 65–99)
Potassium: 3.4 mmol/L — ABNORMAL LOW (ref 3.5–5.1)
Sodium: 139 mmol/L (ref 135–145)

## 2017-04-24 MED ORDER — TRAMADOL HCL 50 MG PO TABS
50.0000 mg | ORAL_TABLET | Freq: Four times a day (QID) | ORAL | Status: DC | PRN
  Administered 2017-04-24 – 2017-04-25 (×3): 50 mg via ORAL
  Filled 2017-04-24 (×3): qty 1

## 2017-04-24 MED ORDER — BACLOFEN 10 MG PO TABS
10.0000 mg | ORAL_TABLET | Freq: Three times a day (TID) | ORAL | Status: DC
  Administered 2017-04-24 – 2017-04-25 (×4): 10 mg via ORAL
  Filled 2017-04-24 (×4): qty 1

## 2017-04-24 MED ORDER — BENAZEPRIL HCL 20 MG PO TABS
20.0000 mg | ORAL_TABLET | Freq: Every day | ORAL | Status: DC
  Administered 2017-04-24: 20 mg via ORAL
  Filled 2017-04-24: qty 1

## 2017-04-24 MED ORDER — AMOXICILLIN-POT CLAVULANATE 875-125 MG PO TABS
1.0000 | ORAL_TABLET | Freq: Two times a day (BID) | ORAL | Status: DC
  Administered 2017-04-25: 1 via ORAL
  Filled 2017-04-24: qty 1

## 2017-04-24 MED ORDER — LOPERAMIDE HCL 2 MG PO CAPS
2.0000 mg | ORAL_CAPSULE | Freq: Four times a day (QID) | ORAL | Status: DC | PRN
  Administered 2017-04-25: 2 mg via ORAL
  Filled 2017-04-24: qty 1

## 2017-04-24 MED ORDER — PIPERACILLIN-TAZOBACTAM 3.375 G IVPB
3.3750 g | Freq: Three times a day (TID) | INTRAVENOUS | Status: AC
  Administered 2017-04-24: 3.375 g via INTRAVENOUS
  Filled 2017-04-24: qty 50

## 2017-04-24 NOTE — Evaluation (Signed)
Physical Therapy Evaluation Patient Details Name: [name] MRN: [id number] DOB: 06-25-1930 Today's Date: 04/24/2017   History of Present Illness  83 y.o. female with medical history significant of atrial fibrillation, breast cancer, fibromyalgia, anxiety, hypertension, hyperlipidemia presents to the emergency department with concerns of headache and nausea and vomiting. CXR showed right lung opacity showing aspiration pneumonia  Clinical Impression  Pt admitted with above diagnosis. Pt currently with functional limitations due to the deficits listed below (see PT Problem List).  Pt will benefit from skilled PT to increase their independence and safety with mobility to allow discharge to the venue listed below.  Pt reports having headache and dizziness since admission and reluctant to mobilize today.  Encouraged pt to sit EOB and at least check BP (no significant change) however pt unable to tolerate any further mobility.  RN in room during session and aware.    Follow Up Recommendations SNF;Supervision/Assistance - 24 hour    Equipment Recommendations  None recommended by PT    Recommendations for Other Services       Precautions / Restrictions Precautions Precautions: Fall      Mobility  Bed Mobility Overal bed mobility: Needs Assistance Bed Mobility: Supine to Sit;Sit to Supine     Supine to sit: Max assist Sit to supine: Mod assist   General bed mobility comments: assist for LEs to over EOB and trunk upright, more trunk support with sitting due to dizziness, assist for LEs onto bed, checked BP laying and sitting and no significant change (RN in room for this as well)  Transfers                 General transfer comment: pt refused to attempt further mobility due to headache and dizziness  Ambulation/Gait                Stairs            Wheelchair Mobility    Modified Rankin (Stroke Patients Only)       Balance Overall balance  assessment: Needs assistance             Standing balance comment: pt reports requiring assistive device however denies any recent falls                             Pertinent Vitals/Pain Pain Assessment: Faces Pain Score: 10-Worst pain ever Pain Location: headache Pain Descriptors / Indicators: Aching Pain Intervention(s): Monitored during session;Repositioned(RN in room, brought tylenol)    Home Living Family/patient expects to be discharged to:: Assisted living               Home Equipment: Walker - 2 wheels      Prior Function Level of Independence: Independent with assistive device(s)         Comments: walks to dining room with RW, denies h/o falls, independent ADLs, reports she is on oxygen at baseline but unable to state how much     Hand Dominance        Extremity/Trunk Assessment        Lower Extremity Assessment Lower Extremity Assessment: Generalized weakness       Communication      Cognition Arousal/Alertness: Awake/alert Behavior During Therapy: WFL for tasks assessed/performed Overall Cognitive Status: Within Functional Limits for tasks assessed  General Comments      Exercises     Assessment/Plan    PT Assessment Patient needs continued PT services  PT Problem List Decreased strength;Decreased mobility;Decreased activity tolerance;Decreased knowledge of use of DME       PT Treatment Interventions Gait training;Patient/family education;Therapeutic exercise;Therapeutic activities;DME instruction;Functional mobility training;Balance training    PT Goals (Current goals can be found in the Care Plan section)  Acute Rehab PT Goals PT Goal Formulation: With patient Time For Goal Achievement: 05/08/17 Potential to Achieve Goals: Good    Frequency Min 3X/week   Barriers to discharge        Co-evaluation               AM-PAC PT "6 Clicks" Daily  Activity  Outcome Measure Difficulty turning over in bed (including adjusting bedclothes, sheets and blankets)?: A Little Difficulty moving from lying on back to sitting on the side of the bed? : Unable Difficulty sitting down on and standing up from a chair with arms (e.g., wheelchair, bedside commode, etc,.)?: Unable Help needed moving to and from a bed to chair (including a wheelchair)?: Total Help needed walking in hospital room?: Total Help needed climbing 3-5 steps with a railing? : Total 6 Click Score: 8    End of Session   Activity Tolerance: Patient limited by pain Patient left: in bed;with call bell/phone within reach;with nursing/sitter in room;with bed alarm set Nurse Communication: Mobility status PT Visit Diagnosis: Difficulty in walking, not elsewhere classified (R26.2)    Time: 5093-2671 PT Time Calculation (min) (ACUTE ONLY): 16 min   Charges:   PT Evaluation $PT Eval Low Complexity: 1 Low     PT G CodesCarmelia Bake, PT, DPT 04/24/2017 Pager: [phone]  York Ram E 04/24/2017, 2:07 PM

## 2017-04-24 NOTE — Consult Note (Signed)
[name] wound consult note Reason for Consult:stage I right heel with callous, partial thickness gluteal cleft Wound type:pressure, MASD, IAD Pressure Injury POA: Yes Measurement: right heel non blanchable area 3cm round with callous intact, dry skin in center that measures 0.4cm x 1cm above skin level, callous is white, dry, flaky.  MASD partial thickness wound to gluteal cleft from IAD, no drainage or odor Wound bed:see above Drainage (amount, consistency, odor) none Periwound: intact Dressing procedure/placement/frequency:I have provided nurses with orders for protective Allevyn foam for bilateral heels as well as Criticaid purple top barrier cream for IAD in gluteal cleft. Float heels with pillow.  Pt is not  interested in [employer], states she generally is not in the bed and does not have a problem. We will not follow, but will remain available to this patient, to nursing, and the medical and/or surgical teams.  Please re-consult if we need to assist further.   [name], RN-C, WTA-C Wound Treatment Associate

## 2017-04-24 NOTE — Progress Notes (Signed)
Patient had 2 loose bowel movents on 12/11 day shift and 3 watery stools in night shift. Will place patient on enteric precautions this am and follow up with provider.

## 2017-04-24 NOTE — Progress Notes (Signed)
PROGRESS NOTE  [name]  RJJ:[id number] DOB: 09/08/1930 DOA: 04/22/2017 PCP: [name], MD   Brief Narrative: [name] is an 83 y.o. female with a history of AFib, recently started on home oxygen, breast CA, fibromyalgia, anxiety, HTN, and HLD who presented to the ED from assisted living facility for headache, nausea and vomiting. Exam was nonfocal and CT head showed chronic small vessel white matter changes without acute bleeding/infarct. She developed hypoxia with in the ED, requiring 5L O2. CXR was obtained and demonstrated RLL consolidation, treated as aspiration pneumonia with improvement. Speech therapy recommended dysphagia 3 diet. On 12/11, she developed diarrhea, though she reports this is not an infrequent occurrence PTA. C. diff checked and negative. Started probiotic and restarted home loperamide. PT has evaluated the patient, recommending SNF at discharge.   Assessment & Plan: Principal Problem:   Respiratory failure with hypoxia ([place]) Active Problems:   Hypertension   Paroxysmal atrial fibrillation (HCC)   Hypothyroidism   Aspiration pneumonia (HCC)   Acute hypoxemic respiratory failure (HCC)   Pressure injury of skin  Acute on chronic hypoxic respiratory failure: Recently started on home O2, though she does not always use this.  - Wean oxygen as tolerated - Continue antibiotics for pneumonia  RLL aspiration pneumonia:  - Continue zosyn, will transition to augmentin 12/13.   Headache: Chronic, flaring. No red flag symptoms.  - Restart home tramadol prn.   PAF: Currently in NSR - Continue eliquis. - Continue amiodarone. - DC telemetry  HTN: Chronic, stable - Restart po ACE, benazepril  Antibiotic-associated diarrhea: Also has chronic intermittent diarrhea, possibly IBS. - Probiotic - C. diff negative, abdominal exam benign, ok to give home imodium.    Hypothyroidism: TSH 2.575.  - Continue synthroid  History of breat cancer:  - Continue  tamoxifen  Depression, anxiety: Chronic, stable - Continue   Nonblanchable (stage 1) pressure ulcer of right heel and moisture-associated dermatitis with partial thickness wound of gluteal cleft: - WOC consulted, recommending Allevyn foam for bilateral heels as well as Criticaid purple top barrier cream for IAD in gluteal cleft. Float heels with pillow. - Mobilize as able.   DVT prophylaxis: Eliquis Code Status: DNR Family Communication: None at bedside Disposition Plan: SNF in next 24-48hrs.  Consultants: Speech therapy Procedures: None Antimicrobials: Zosyn  Subjective: Headache continues, though it has generally reduced in intensity, described as "both sides" in halo distribution, constant, worse with rising from laying down, not associated with neck pain/stiffness or photophobia.   Objective: Vitals:   04/23/17 2256 04/24/17 0458 04/24/17 1405 04/24/17 1406  BP: (!) 159/68 140/65 (!) 152/75 (!) 154/69  Pulse: 60 (!) 58 65   Resp: 18 16 16    Temp: 97.9 F (36.6 C) 98.2 F (36.8 C) 98.4 F (36.9 C)   TempSrc: Oral Oral Oral   SpO2: 97% 97% 97%   Weight:      Height:        Intake/Output Summary (Last 24 hours) at 04/24/2017 1658 Last data filed at 04/23/2017 1800 Gross per 24 hour  Intake -  Output 200 ml  Net -200 ml   Filed Weights   04/22/17 0814 04/22/17 1836  Weight: 77.1 kg (170 lb) 76.3 kg (168 lb 3.4 oz)    Gen: Anxious-appearing elderly female in no distress Pulm: Non-labored breathing. Clear to auscultation bilaterally. No crackles or wheezes noted.  CV: Regular rate and rhythm. No murmur, rub, or gallop. No JVD, no pedal edema. GI: Abdomen soft, non-tender, non-distended, with  normoactive bowel sounds. No organomegaly or masses felt. Ext: Warm, no deformities Skin: right heel with nonblanchable erythema and moderate callus, no drainage. Gluteal cleft with partial thickness wound suspect from incontinence/moisture.  Neuro: Alert and oriented. No focal  neurological deficits. Psych: Judgement and insight appear normal. Mood anxious & affect congruent.   Data Reviewed: I have personally reviewed following labs and imaging studies  CBC: Recent Labs  Lab 04/22/17 0920 04/23/17 0458  WBC 7.2 12.1*  HGB 12.4 13.3  HCT 38.8 42.2  MCV 90.7 90.4  PLT 229 161   Basic Metabolic Panel: Recent Labs  Lab 04/22/17 1129 04/23/17 0458 04/24/17 0414  NA 140 142 139  K 4.6 2.7* 3.4*  CL 105 103 105  CO2 28 28 27   GLUCOSE 121* 110* 96  BUN 13 9 9   CREATININE 0.65 0.46 0.47  CALCIUM 8.2* 8.0* 7.2*  MG  --  2.0  --    GFR: Estimated Creatinine Clearance: 47.8 mL/min (by C-G formula based on SCr of 0.47 mg/dL). Liver Function Tests: Recent Labs  Lab 04/22/17 1129 04/23/17 0458  AST 32 17  ALT 12* 12*  ALKPHOS 41 45  BILITOT 1.7* 1.2  PROT 6.7 6.8  ALBUMIN 3.6 3.5   Recent Labs  Lab 04/22/17 1129  LIPASE 31   No results for input(s): AMMONIA in the last 168 hours. Coagulation Profile: No results for input(s): INR, PROTIME in the last 168 hours. Cardiac Enzymes: No results for input(s): CKTOTAL, CKMB, CKMBINDEX, TROPONINI in the last 168 hours. BNP (last 3 results) No results for input(s): PROBNP in the last 8760 hours. HbA1C: No results for input(s): HGBA1C in the last 72 hours. CBG: No results for input(s): GLUCAP in the last 168 hours. Lipid Profile: No results for input(s): CHOL, HDL, LDLCALC, TRIG, CHOLHDL, LDLDIRECT in the last 72 hours. Thyroid Function Tests: Recent Labs    04/23/17 0458  TSH 2.575   Anemia Panel: No results for input(s): VITAMINB12, FOLATE, FERRITIN, TIBC, IRON, RETICCTPCT in the last 72 hours. Urine analysis:    Component Value Date/Time   COLORURINE YELLOW 04/22/2017 [place] 04/22/2017 0819   LABSPEC 1.012 04/22/2017 0819   PHURINE 5.0 04/22/2017 [place] 04/22/2017 0819   HGBUR NEGATIVE 04/22/2017 0819   BILIRUBINUR NEGATIVE 04/22/2017 0819    KETONESUR NEGATIVE 04/22/2017 0819   PROTEINUR NEGATIVE 04/22/2017 0819   UROBILINOGEN 1.0 03/07/2011 0044   NITRITE NEGATIVE 04/22/2017 0819   LEUKOCYTESUR NEGATIVE 04/22/2017 0819   Recent Results (from the past 240 hour(s))  C difficile quick scan w PCR reflex     Status: None   Collection Time: 04/24/17 10:10 AM  Result Value Ref Range Status   C Diff antigen NEGATIVE NEGATIVE Final   C Diff toxin NEGATIVE NEGATIVE Final   C Diff interpretation No C. difficile detected.  Final    Comment: NEGATIVE      Radiology Studies: Dg Abd Acute W/chest  Result Date: 04/23/2017 CLINICAL DATA:  Nausea and vomiting. EXAM: DG ABDOMEN ACUTE W/ 1V CHEST COMPARISON:  Chest x-ray 04/22/2017 FINDINGS: Chronic cardiomegaly. Chronic interstitial coarsening at the bases with improved aeration since prior. No edema, effusion, or pneumothorax. No evidence of bowel obstruction or pneumoperitoneum. No generalized stool retention. No concerning mass effect or calcification. Severe scoliosis and lumbar spine degeneration. Left hip hemiarthroplasty with protrusio changes. Cholecystectomy clips.  Vaginal pessary. IMPRESSION: 1. Normal bowel gas pattern. 2. No evidence of acute cardiopulmonary disease when compared to prior.  Electronically Signed   By: Monte Fantasia M.D.   On: 04/23/2017 19:32    Scheduled Meds: . amiodarone  100 mg Oral Daily  . apixaban  5 mg Oral BID  . baclofen  10 mg Oral TID  . benazepril  20 mg Oral QHS  . gabapentin  200 mg Oral QID  . levothyroxine  50 mcg Oral QAC breakfast  . tamoxifen  10 mg Oral Daily  . traMADol  50 mg Oral Q0600  . venlafaxine XR  150 mg Oral Daily   Continuous Infusions: . sodium chloride 75 mL/hr at 04/22/17 2021  . piperacillin-tazobactam (ZOSYN)  IV 3.375 g (04/24/17 1645)     LOS: 2 days   Time spent: 25 minutes.  Karen Gather, MD [facility] Pager [phone]  If 7PM-7AM, please contact night-coverage [email or web address] Password  [facility] 04/24/2017, 4:58 PM

## 2017-04-25 DIAGNOSIS — Z79810 Long term (current) use of selective estrogen receptor modulators (SERMs): Secondary | ICD-10-CM | POA: Diagnosis not present

## 2017-04-25 DIAGNOSIS — R2681 Unsteadiness on feet: Secondary | ICD-10-CM | POA: Diagnosis not present

## 2017-04-25 DIAGNOSIS — C50919 Malignant neoplasm of unspecified site of unspecified female breast: Secondary | ICD-10-CM | POA: Diagnosis not present

## 2017-04-25 DIAGNOSIS — R1312 Dysphagia, oropharyngeal phase: Secondary | ICD-10-CM | POA: Diagnosis not present

## 2017-04-25 DIAGNOSIS — R5381 Other malaise: Secondary | ICD-10-CM | POA: Diagnosis not present

## 2017-04-25 DIAGNOSIS — E785 Hyperlipidemia, unspecified: Secondary | ICD-10-CM | POA: Diagnosis not present

## 2017-04-25 DIAGNOSIS — E039 Hypothyroidism, unspecified: Secondary | ICD-10-CM | POA: Diagnosis not present

## 2017-04-25 DIAGNOSIS — I480 Paroxysmal atrial fibrillation: Secondary | ICD-10-CM | POA: Diagnosis not present

## 2017-04-25 DIAGNOSIS — L309 Dermatitis, unspecified: Secondary | ICD-10-CM | POA: Diagnosis not present

## 2017-04-25 DIAGNOSIS — J9600 Acute respiratory failure, unspecified whether with hypoxia or hypercapnia: Secondary | ICD-10-CM | POA: Diagnosis not present

## 2017-04-25 DIAGNOSIS — J9691 Respiratory failure, unspecified with hypoxia: Secondary | ICD-10-CM | POA: Diagnosis not present

## 2017-04-25 DIAGNOSIS — I5032 Chronic diastolic (congestive) heart failure: Secondary | ICD-10-CM | POA: Diagnosis not present

## 2017-04-25 DIAGNOSIS — F329 Major depressive disorder, single episode, unspecified: Secondary | ICD-10-CM | POA: Diagnosis not present

## 2017-04-25 DIAGNOSIS — L89611 Pressure ulcer of right heel, stage 1: Secondary | ICD-10-CM | POA: Diagnosis not present

## 2017-04-25 DIAGNOSIS — J690 Pneumonitis due to inhalation of food and vomit: Secondary | ICD-10-CM | POA: Diagnosis not present

## 2017-04-25 DIAGNOSIS — G8929 Other chronic pain: Secondary | ICD-10-CM | POA: Diagnosis not present

## 2017-04-25 DIAGNOSIS — R51 Headache: Secondary | ICD-10-CM | POA: Diagnosis not present

## 2017-04-25 DIAGNOSIS — M797 Fibromyalgia: Secondary | ICD-10-CM | POA: Diagnosis not present

## 2017-04-25 DIAGNOSIS — I10 Essential (primary) hypertension: Secondary | ICD-10-CM | POA: Diagnosis not present

## 2017-04-25 DIAGNOSIS — J9601 Acute respiratory failure with hypoxia: Secondary | ICD-10-CM | POA: Diagnosis not present

## 2017-04-25 DIAGNOSIS — J310 Chronic rhinitis: Secondary | ICD-10-CM | POA: Diagnosis not present

## 2017-04-25 DIAGNOSIS — K521 Toxic gastroenteritis and colitis: Secondary | ICD-10-CM | POA: Diagnosis not present

## 2017-04-25 DIAGNOSIS — F418 Other specified anxiety disorders: Secondary | ICD-10-CM | POA: Diagnosis not present

## 2017-04-25 DIAGNOSIS — Z7901 Long term (current) use of anticoagulants: Secondary | ICD-10-CM | POA: Diagnosis not present

## 2017-04-25 DIAGNOSIS — M6281 Muscle weakness (generalized): Secondary | ICD-10-CM | POA: Diagnosis not present

## 2017-04-25 DIAGNOSIS — J9611 Chronic respiratory failure with hypoxia: Secondary | ICD-10-CM | POA: Diagnosis not present

## 2017-04-25 MED ORDER — AMOXICILLIN-POT CLAVULANATE 875-125 MG PO TABS: 1.0000 | ORAL_TABLET | Freq: Two times a day (BID) | ORAL | 0 refills | Status: DC

## 2017-04-25 MED ORDER — BACLOFEN 10 MG PO TABS: 10.0000 mg | ORAL_TABLET | Freq: Three times a day (TID) | ORAL | 0 refills | Status: AC

## 2017-04-25 MED ORDER — TRAMADOL HCL 50 MG PO TABS: 50.0000 mg | ORAL_TABLET | Freq: Four times a day (QID) | ORAL | 0 refills | Status: DC | PRN

## 2017-04-25 NOTE — Clinical Social Work Placement (Signed)
Patient received and accepted bed offer at [facility] and rehab SNF. Facility aware of patient's discharge and confirmed bed offer. PTAR contacted, patient aware. Patient's RN can call report to [phone] Room 307, packet complete. CSW signing off, no other needs identified.  CLINICAL SOCIAL WORK PLACEMENT  NOTE  Date:  04/25/2017  Patient Details  Name: [name] MRN: [id number] Date of Birth: Feb 21, 1931  Clinical Social Work is seeking post-discharge placement for this patient at the [place] level of care (*CSW will initial, date and re-position this form in  chart as items are completed):  Yes   Patient/family provided with [place] Work Department's list of facilities offering this level of care within the geographic area requested by the patient (or if unable, by the patient's family).  Yes   Patient/family informed of their freedom to choose among providers that offer the needed level of care, that participate in Medicare, Medicaid or managed care program needed by the patient, have an available bed and are willing to accept the patient.  Yes   Patient/family informed of [facility]'s ownership interest in [facility] and [facility], as well as of the fact that they are under no obligation to receive care at these facilities.  PASRR submitted to EDS on       PASRR number received on       Existing PASRR number confirmed on 04/25/17     FL2 transmitted to all facilities in geographic area requested by pt/family on 04/25/17     FL2 transmitted to all facilities within larger geographic area on       Patient informed that his/her managed care company has contracts with or will negotiate with certain facilities, including the following:        Yes   Patient/family informed of bed offers received.  Patient chooses bed at [facility] recommends and patient chooses bed at      Patient to be  transferred to [facility] on 04/25/17.  Patient to be transferred to facility by PTAR     Patient family notified on 04/25/17 of transfer.  Name of family member notified:  Patient's cousin did not answer [name] Muirs) no option to leave a voicemail     PHYSICIAN       Additional Comment:    _______________________________________________ Burnis Medin, LCSW 04/25/2017, 3:09 PM

## 2017-04-25 NOTE — Progress Notes (Signed)
Patient to be DC'd to SNF today, IVs x 2 removed, placed in paper gown for transport to facility by [facility]

## 2017-04-25 NOTE — Discharge Summary (Addendum)
Physician Discharge Summary  [name] MGQ:[id number] DOB: 1930-11-29 DOA: 04/22/2017  PCP: [name], MD  Admit date: 04/22/2017 Discharge date: 04/25/2017  Admitted From: ILF Disposition: SNF   Recommendations for Outpatient Follow-up:  1. Follow up with PCP in 1-2 weeks 2. Please obtain BMP/CBC in one week  Home Health: N/A Equipment/Devices: Per SNF Discharge Condition: Stable CODE STATUS: DNR Diet recommendation: Dysphagia 3 diet  Brief/Interim Summary: [name] is an 83 y.o. female with a history of AFib, recently started on home oxygen, breast CA, fibromyalgia, anxiety, HTN, and HLD who presented to the ED from assisted living facility for headache, nausea and vomiting. Exam was nonfocal and CT head showed chronic small vessel white matter changes without acute bleeding/infarct. She developed hypoxia with in the ED, requiring 5L O2. CXR was obtained and demonstrated RLL consolidation, treated as aspiration pneumonia with improvement. Speech therapy recommended dysphagia 3 diet. On 12/11, she developed diarrhea, though she reports this is not an infrequent occurrence PTA. C. diff checked and negative. Started probiotic and restarted home loperamide with improvement. PT has evaluated the patient, recommending SNF at discharge.   Discharge Diagnoses:  Principal Problem:   Respiratory failure with hypoxia ([place]) Active Problems:   Hypertension   Paroxysmal atrial fibrillation (HCC)   Hypothyroidism   Aspiration pneumonia (HCC)   Acute hypoxemic respiratory failure (HCC)   Pressure injury of skin  Acute hypoxic respiratory failure: Recently started on home O2, though she does not always use this and does not require this at the time of discharge. - No oxygen requirement at discharge.  - Continue antibiotics for pneumonia  RLL aspiration pneumonia:  - Started zosyn 12/10, transitioned to augmentin 12/13, plan to complete 7 day course.   Headache:  Chronic, flaring. No red flag symptoms.  - Restarted home tramadol prn.   PAF: Currently in NSR - Continue eliquis. - Continue amiodarone.  HTN: Chronic, stable - Restarted norvasc, benazepril.  - Ok to restart lasix and potassium at discharge, though I do not see a compelling indication for these, and would consider stopping.   Antibiotic-associated diarrhea: Also has chronic intermittent diarrhea, possibly IBS. - Probiotic - C. diff negative, abdominal exam benign, ok to give home imodium.    Hypothyroidism: TSH 2.575.  - Continue synthroid  History of breat cancer:  - Continue tamoxifen  Depression, anxiety: Chronic, stable - Continue   Nonblanchable (stage 1) pressure ulcer of right heel and moisture-associated dermatitis with partial thickness wound of gluteal cleft: - WOC consulted, recommending Allevyn foam for bilateral heels as well as Criticaid purple top barrier cream for IAD in gluteal cleft. Float heels with pillow. - Mobilize as able.   Discharge Instructions Discharge Instructions    Increase activity slowly   Complete by:  As directed      Allergies as of 04/25/2017      Reactions   Sulfa Antibiotics Other (See Comments)   Per mar      Medication List    STOP taking these medications   OXYGEN     TAKE these medications   acetaminophen 500 MG tablet Commonly known as:  TYLENOL Take 500 mg by mouth every 4 (four) hours. What changed:  Another medication with the same name was removed. Continue taking this medication, and follow the directions you see here.   amiodarone 100 MG tablet Commonly known as:  PACERONE Take 1 tablet (100 mg total) daily by mouth.   amLODipine 10 MG tablet Commonly known as:  NORVASC Take 10 mg by mouth daily.   amoxicillin-clavulanate 875-125 MG tablet Commonly known as:  AUGMENTIN Take 1 tablet by mouth 2 (two) times daily.   baclofen 10 MG tablet Commonly known as:  LIORESAL Take 1 tablet (10 mg total)  by mouth 3 (three) times daily.   benazepril 20 MG tablet Commonly known as:  LOTENSIN Take 20 mg by mouth at bedtime.   calcium carbonate 500 MG chewable tablet Commonly known as:  TUMS - dosed in mg elemental calcium Chew 2 tablets by mouth as needed for indigestion or heartburn.   carboxymethylcellulose 0.5 % Soln Commonly known as:  REFRESH PLUS Place 1 drop into both eyes 4 (four) times daily as needed (dry eyes).   cholecalciferol 1000 units tablet Commonly known as:  VITAMIN D Take 1,000 Units by mouth daily.   ELIQUIS 5 MG Tabs tablet Generic drug:  apixaban Take 5 mg by mouth 2 (two) times daily.   furosemide 80 MG tablet Commonly known as:  LASIX Take 80 mg by mouth every morning.   gabapentin 100 MG capsule Commonly known as:  NEURONTIN Take 200 mg 4 (four) times daily by mouth.   guaiFENesin 600 MG 12 hr tablet Commonly known as:  MUCINEX Take 600 mg by mouth 2 (two) times daily as needed for cough.   levothyroxine 50 MCG tablet Commonly known as:  SYNTHROID, LEVOTHROID Take 50 mcg by mouth daily.   loperamide 2 MG capsule Commonly known as:  IMODIUM Take 2 mg by mouth 4 (four) times daily as needed. For loose stools/diarrhea   menthol-zinc oxide powder Apply 1 application topically daily.   multivitamins ther. w/minerals Tabs tablet Take 1 tablet by mouth daily.   polyethylene glycol packet Commonly known as:  MIRALAX / GLYCOLAX Take 17 g by mouth daily as needed. For constipation.   potassium chloride SA 20 MEQ tablet Commonly known as:  K-DUR,KLOR-CON Take 20 mEq by mouth daily.   tamoxifen 10 MG tablet Commonly known as:  NOLVADEX Take 10 mg by mouth daily.   tolnaftate 1 % powder Commonly known as:  TINACTIN Apply 1 application topically as needed for itching.   traMADol 50 MG tablet Commonly known as:  ULTRAM Take 1 tablet (50 mg total) by mouth every 6 (six) hours as needed for moderate pain.   TRIMO-SAN 0.025 % Gel Generic drug:   OXYQUINOLONE SULFATE VAGINAL Place 1 application vaginally at bedtime. On Tuesday and friday   venlafaxine XR 150 MG 24 hr capsule Commonly known as:  EFFEXOR-XR Take 150 mg by mouth daily.       Contact information for follow-up providers    [name], MD Follow up.   Specialty:  Family Medicine Contact information: [address] [place] [address] [phone]            Contact information for after-discharge care    Destination    [facility] .   Service:  Skilled Nursing Contact information: [address] [address] [phone]                 Allergies  Allergen Reactions  . Sulfa Antibiotics Other (See Comments)    Per mar    Consultations:  None  Procedures/Studies: Dg Chest 2 View  Result Date: 04/22/2017 CLINICAL DATA:  83 year old female with headache and vomiting this morning. Hypoxia despite supplemental oxygen and complaining of right side shortness of breath. EXAM: CHEST  2 VIEW  COMPARISON:  03/27/2017 and earlier. FINDINGS: Semi upright AP and lateral views of the chest. Lower lung volumes on the lateral view. Seen on only the frontal view there is mild patchy opacity at the right lung base. No other confluent pulmonary opacity. Stable mediastinal contours. Visualized tracheal air column is within normal limits. No pneumothorax, pulmonary edema or pleural effusion. On the lateral view there is an air-fluid level in a distended probably large bowel loop. No acute osseous abnormality identified. IMPRESSION: 1. Mild right lung base patchy opacity is nonspecific. Consider Aspiration in this clinical setting. 2. No other acute cardiopulmonary abnormality. 3. Gas and fluid dilated bowel loop visible on the lateral view. Recommend Acute Abdominal Series. Electronically Signed   By: Genevie Ann M.D.   On: 04/22/2017 11:06   Dg Chest 2 View  Result Date: 03/27/2017 CLINICAL  DATA:  Hypoxia and fever EXAM: CHEST  2 VIEW COMPARISON:  11/21/2016 FINDINGS: Mild cardiomegaly. Aortic atherosclerosis. Streaky atelectasis or infiltrate at the left lung base. No pneumothorax. IMPRESSION: Cardiomegaly.  Streaky atelectasis or infiltrate at the left base Electronically Signed   By: Donavan Foil M.D.   On: 03/27/2017 22:54   Ct Head Wo Contrast  Result Date: 04/22/2017 CLINICAL DATA:  Dizziness and headache. EXAM: CT HEAD WITHOUT CONTRAST TECHNIQUE: Contiguous axial images were obtained from the base of the skull through the vertex without intravenous contrast. COMPARISON:  10/26/2016 FINDINGS: Brain: The brainstem, cerebellum, cerebral peduncles, thalami, basal ganglia, basilar cisterns, and ventricular system appear within normal limits. Periventricular white matter and corona radiata hypodensities favor chronic ischemic microvascular white matter disease. No intracranial hemorrhage, mass lesion, or acute CVA. Vascular: Unremarkable Skull: Unremarkable Sinuses/Orbits: Unremarkable Other: No supplemental non-categorized findings. IMPRESSION: 1. No acute intracranial findings. 2. Periventricular white matter and corona radiata hypodensities favor chronic ischemic microvascular white matter disease. Electronically Signed   By: Van Clines M.D.   On: 04/22/2017 10:19   Dg Abd Acute W/chest  Result Date: 04/23/2017 CLINICAL DATA:  Nausea and vomiting. EXAM: DG ABDOMEN ACUTE W/ 1V CHEST COMPARISON:  Chest x-ray 04/22/2017 FINDINGS: Chronic cardiomegaly. Chronic interstitial coarsening at the bases with improved aeration since prior. No edema, effusion, or pneumothorax. No evidence of bowel obstruction or pneumoperitoneum. No generalized stool retention. No concerning mass effect or calcification. Severe scoliosis and lumbar spine degeneration. Left hip hemiarthroplasty with protrusio changes. Cholecystectomy clips.  Vaginal pessary. IMPRESSION: 1. Normal bowel gas pattern. 2. No  evidence of acute cardiopulmonary disease when compared to prior. Electronically Signed   By: Monte Fantasia M.D.   On: 04/23/2017 19:32    Subjective: Cough, fever, and dyspnea improved. She continues to have pain in the top of her head which she finds very difficult to describe. It improved with tramadol and tylenol as she was taking previously. No weakness, numbness. No chest pain.   Discharge Exam: Vitals:   04/25/17 0525 04/25/17 1424  BP: (!) 162/80 (!) 163/66  Pulse: 63 65  Resp: 16 18  Temp: 97.9 F (36.6 C) 99.5 F (37.5 C)  SpO2: 93% 94%   General: Anxious but nontoxic appearing female in no distress Cardiovascular: RRR, S1/S2 +, no rubs, no gallops Respiratory: CTA bilaterally, no wheezing, no rhonchi Abdominal: Soft, NT, ND, bowel sounds + Extremities: No edema, no cyanosis Skin: right heel with nonblanchable erythema and moderate callus, no drainage. Gluteal cleft with partial thickness wound suspect from incontinence/moisture.   Labs: BNP (last 3 results) Recent Labs    04/22/17 0920  BNP 76.8  Basic Metabolic Panel: Recent Labs  Lab 04/22/17 1129 04/23/17 0458 04/24/17 0414  NA 140 142 139  K 4.6 2.7* 3.4*  CL 105 103 105  CO2 28 28 27   GLUCOSE 121* 110* 96  BUN 13 9 9   CREATININE 0.65 0.46 0.47  CALCIUM 8.2* 8.0* 7.2*  MG  --  2.0  --    Liver Function Tests: Recent Labs  Lab 04/22/17 1129 04/23/17 0458  AST 32 17  ALT 12* 12*  ALKPHOS 41 45  BILITOT 1.7* 1.2  PROT 6.7 6.8  ALBUMIN 3.6 3.5   Recent Labs  Lab 04/22/17 1129  LIPASE 31   No results for input(s): AMMONIA in the last 168 hours. CBC: Recent Labs  Lab 04/22/17 0920 04/23/17 0458  WBC 7.2 12.1*  HGB 12.4 13.3  HCT 38.8 42.2  MCV 90.7 90.4  PLT 229 216   Cardiac Enzymes: No results for input(s): CKTOTAL, CKMB, CKMBINDEX, TROPONINI in the last 168 hours. BNP: Invalid input(s): POCBNP CBG: No results for input(s): GLUCAP in the last 168 hours. D-Dimer No  results for input(s): DDIMER in the last 72 hours. Hgb A1c No results for input(s): HGBA1C in the last 72 hours. Lipid Profile No results for input(s): CHOL, HDL, LDLCALC, TRIG, CHOLHDL, LDLDIRECT in the last 72 hours. Thyroid function studies Recent Labs    04/23/17 0458  TSH 2.575   Anemia work up No results for input(s): VITAMINB12, FOLATE, FERRITIN, TIBC, IRON, RETICCTPCT in the last 72 hours. Urinalysis    Component Value Date/Time   COLORURINE YELLOW 04/22/2017 [place] 04/22/2017 0819   LABSPEC 1.012 04/22/2017 0819   PHURINE 5.0 04/22/2017 0819   GLUCOSEU NEGATIVE 04/22/2017 0819   HGBUR NEGATIVE 04/22/2017 0819   BILIRUBINUR NEGATIVE 04/22/2017 0819   KETONESUR NEGATIVE 04/22/2017 0819   PROTEINUR NEGATIVE 04/22/2017 0819   UROBILINOGEN 1.0 03/07/2011 0044   NITRITE NEGATIVE 04/22/2017 0819   LEUKOCYTESUR NEGATIVE 04/22/2017 0819    Microbiology Recent Results (from the past 240 hour(s))  C difficile quick scan w PCR reflex     Status: None   Collection Time: 04/24/17 10:10 AM  Result Value Ref Range Status   C Diff antigen NEGATIVE NEGATIVE Final   C Diff toxin NEGATIVE NEGATIVE Final   C Diff interpretation No C. difficile detected.  Final    Comment: NEGATIVE    Time coordinating discharge: Approximately 40 minutes  Vance Gather, MD  [facility] 04/25/2017, 2:53 PM Pager [phone]

## 2017-04-25 NOTE — Clinical Social Work Note (Signed)
Clinical Social Work Assessment  Patient Details  Name: [name] MRN: [id number] Date of Birth: [birth year]  Date of referral:  04/25/17               Reason for consult:  Facility Placement                Permission sought to share information with:  Facility Sport and exercise psychologist, Family Supports Permission granted to share information::  Yes, Verbal Permission Granted  Name::     Marnette Burgess  Agency::     Relationship::  Emergency planning/management officer Information:  [phone]  Housing/Transportation Living arrangements for the past 2 months:  Assisted Living Facility([facility]) Source of Information:  Patient Patient Interpreter Needed:  None Criminal Activity/Legal Involvement Pertinent to Current Situation/Hospitalization:  No - Comment as needed Significant Relationships:  Other Family Members(cousin eileen muirs) Lives with:  Facility Resident Do you feel safe going back to the place where you live?  (PT recommending SNF) Need for family participation in patient care:  No (Coment)  Care giving concerns:  Patient from [facility].    Social Worker assessment / plan:  CSW spoke with patient at bedside regarding PT recommendation for SNF for ST rehab. Patient agreeable to SNF for ST rehab, CSW explained SNF placement process. Patient requested that CSW contact her cousin to assist with discharge plannning.   CSW called patient's cousin, no answer.   CSW completed FL2 and will follow up with bed offers.   Employment status:  Retired Forensic scientist:  Medicare PT Recommendations:  [place] / Referral to community resources:  [place]  Patient/Family's Response to care:  Patient agreeable to SNF for ST rehab, patient appreciative of CSW assistance with discharge planning.   Patient/Family's Understanding of and Emotional Response to Diagnosis, Current Treatment, and Prognosis:  Patient engaged  through assessment and began to cry when talking about her condition. Patient reported that she was in pain. CSW provided active listening and emotional support. Patient verbalized plan to dc to SNF for ST rehab.  Emotional Assessment Appearance:  Appears stated age Attitude/Demeanor/Rapport:    Affect (typically observed):  Tearful/Crying Orientation:  Oriented to Self, Oriented to Place, Oriented to  Time, Oriented to Situation Alcohol / Substance use:  Not Applicable Psych involvement (Current and /or in the community):  No (Comment)  Discharge Needs  Concerns to be addressed:  Care Coordination Readmission within the last 30 days:  Yes Current discharge risk:  Physical Impairment Barriers to Discharge:  No Barriers Identified   Burnis Medin, LCSW 04/25/2017, 1:27 PM

## 2017-04-25 NOTE — NC FL2 (Signed)
[place] LEVEL OF CARE SCREENING TOOL     IDENTIFICATION  Patient Name: [name] Birthdate: January 14, 1931 Sex: female Admission Date (Current Location): 04/22/2017  [facility] and [place] Number:  Herbalist and Address:  [facility],  [address], [place]      Provider Number: [id number]  Attending Physician Name and Address:  Patrecia Pour, MD  Relative Name and Phone Number:       Current Level of Care: Hospital Recommended Level of Care: [place] Prior Approval Number:    Date Approved/Denied:   PASRR Number: [id number]  Discharge Plan: SNF    Current Diagnoses: Patient Active Problem List   Diagnosis Date Noted  . Pressure injury of skin 04/23/2017  . Aspiration pneumonia ([place]) 04/22/2017  . Acute hypoxemic respiratory failure ([place]) 04/22/2017  . HCAP (healthcare-associated pneumonia) 03/27/2017  . Respiratory failure with hypoxia ([place]) 11/02/2015  . Depression 11/02/2015  . Anxiety 11/02/2015  . Hypertension 11/02/2015  . Paroxysmal atrial fibrillation ([place]) 11/02/2015  . Hypothyroidism 11/02/2015  . Chronic venous stasis dermatitis 11/02/2015  . Chronic pain 11/02/2015  . Breast cancer ([place]) 11/02/2015    Orientation RESPIRATION BLADDER Height & Weight     Self, Time, Situation, Place  Normal Continent Weight: 168 lb 3.4 oz (76.3 kg) Height:  5' (152.4 cm)  BEHAVIORAL SYMPTOMS/MOOD NEUROLOGICAL BOWEL NUTRITION STATUS      Continent Diet(DYS 3)  AMBULATORY STATUS COMMUNICATION OF NEEDS Skin   Total Care Verbally PU Stage and Appropriate Care PU Stage 1 Dressing: (Location: Heel Location Orientation: Posterior;Right      PressureInjury12/10/18StageI-Intactskinwithnon-blanchablerednessofalocalizedareausuallyoverabonyprominence.2cmroundnonblanchablereddenedarea  Dressing Type: Foam)                     Personal Care Assistance Level of Assistance   Bathing, Feeding, Dressing Bathing Assistance: Maximum assistance Feeding assistance: Independent Dressing Assistance: Maximum assistance     Functional Limitations Info  Sight, Hearing, Speech Sight Info: Adequate Hearing Info: Adequate Speech Info: Adequate    SPECIAL CARE FACTORS FREQUENCY  PT (By licensed PT), OT (By licensed OT)     PT Frequency: 5x OT Frequency: 5x            Contractures Contractures Info: Not present    Additional Factors Info  Code Status, Allergies Code Status Info: DNR Allergies Info: Sulfa Antibiotics           Current Medications (04/25/2017):  This is the current hospital active medication list Current Facility-Administered Medications  Medication Dose Route Frequency Provider Last Rate Last Dose  . 0.9 %  sodium chloride infusion   Intravenous Continuous Donne Hazel, MD 75 mL/hr at 04/25/17 0105    . acetaminophen (TYLENOL) tablet 650 mg  650 mg Oral Q6H PRN Donne Hazel, MD   650 mg at 04/24/17 2303   Or  . acetaminophen (TYLENOL) suppository 650 mg  650 mg Rectal Q6H PRN Donne Hazel, MD      . amiodarone (PACERONE) tablet 100 mg  100 mg Oral Daily Donne Hazel, MD   100 mg at 04/25/17 1002  . amoxicillin-clavulanate (AUGMENTIN) 875-125 MG per tablet 1 tablet  1 tablet Oral Q12H Patrecia Pour, MD   1 tablet at 04/25/17 1002  . apixaban (ELIQUIS) tablet 5 mg  5 mg Oral BID Donne Hazel, MD   5 mg at 04/25/17 1002  . baclofen (LIORESAL) tablet 10 mg  10 mg Oral TID Patrecia Pour,  MD   10 mg at 04/25/17 1002  . benazepril (LOTENSIN) tablet 20 mg  20 mg Oral QHS Patrecia Pour, MD   20 mg at 04/24/17 2129  . gabapentin (NEURONTIN) capsule 200 mg  200 mg Oral QID Donne Hazel, MD   200 mg at 04/25/17 1002  . hydrALAZINE (APRESOLINE) injection 5 mg  5 mg Intravenous Q4H PRN Donne Hazel, MD      . levothyroxine (SYNTHROID, LEVOTHROID) tablet 50 mcg  50 mcg Oral QAC breakfast Donne Hazel, MD   50 mcg at 04/25/17 0815   . loperamide (IMODIUM) capsule 2 mg  2 mg Oral QID PRN Patrecia Pour, MD      . ondansetron [facility]) tablet 4 mg  4 mg Oral Q6H PRN Donne Hazel, MD       Or  . ondansetron [facility]) injection 4 mg  4 mg Intravenous Q6H PRN Donne Hazel, MD   4 mg at 04/23/17 0527  . tamoxifen (NOLVADEX) tablet 10 mg  10 mg Oral Daily Donne Hazel, MD   10 mg at 04/25/17 1002  . traMADol (ULTRAM) tablet 50 mg  50 mg Oral Q6H PRN Patrecia Pour, MD   50 mg at 04/25/17 1006  . venlafaxine XR (EFFEXOR-XR) 24 hr capsule 150 mg  150 mg Oral Daily Donne Hazel, MD   150 mg at 04/25/17 1002     Discharge Medications: Please see discharge summary for a list of discharge medications.  Relevant Imaging Results:  Relevant Lab Results:   Additional Information SS# [id number]  Burnis Medin, LCSW

## 2017-04-25 NOTE — Progress Notes (Signed)
Attempted to call report at [phone], the call was picked up and I could hear noise in the background and then call was disconnected, called back and received fast busy signal

## 2017-04-26 ENCOUNTER — Non-Acute Institutional Stay (SKILLED_NURSING_FACILITY): Payer: Medicare Other | Admitting: Adult Health

## 2017-04-26 ENCOUNTER — Encounter: Payer: Self-pay | Admitting: Adult Health

## 2017-04-26 DIAGNOSIS — I480 Paroxysmal atrial fibrillation: Secondary | ICD-10-CM | POA: Diagnosis not present

## 2017-04-26 DIAGNOSIS — J690 Pneumonitis due to inhalation of food and vomit: Secondary | ICD-10-CM | POA: Diagnosis not present

## 2017-04-26 DIAGNOSIS — F329 Major depressive disorder, single episode, unspecified: Secondary | ICD-10-CM | POA: Diagnosis not present

## 2017-04-26 DIAGNOSIS — C50919 Malignant neoplasm of unspecified site of unspecified female breast: Secondary | ICD-10-CM | POA: Diagnosis not present

## 2017-04-26 DIAGNOSIS — R5381 Other malaise: Secondary | ICD-10-CM

## 2017-04-26 DIAGNOSIS — E039 Hypothyroidism, unspecified: Secondary | ICD-10-CM | POA: Diagnosis not present

## 2017-04-26 DIAGNOSIS — I5032 Chronic diastolic (congestive) heart failure: Secondary | ICD-10-CM | POA: Diagnosis not present

## 2017-04-26 DIAGNOSIS — G8929 Other chronic pain: Secondary | ICD-10-CM | POA: Diagnosis not present

## 2017-04-26 DIAGNOSIS — I10 Essential (primary) hypertension: Secondary | ICD-10-CM

## 2017-04-26 DIAGNOSIS — F32A Depression, unspecified: Secondary | ICD-10-CM

## 2017-04-26 DIAGNOSIS — J9611 Chronic respiratory failure with hypoxia: Secondary | ICD-10-CM

## 2017-04-26 NOTE — Progress Notes (Signed)
Location:  [facility] Room Number: 307-A Place of Service:  SNF (31) Provider:  Durenda Age, NP  Patient Care Team: [name], MD as PCP - General [facility])  Extended Emergency Contact Information Primary Emergency Contact: Muir,Eileen Address: [address]          [place],  [address] Johnnette Litter of [place] Phone: [phone] Relation: Relative Secondary Emergency Contact: Iams,Edward Address: [address] [place] [address] Johnnette Litter of [place] Phone: [phone] Relation: Son  Code Status:  DNR  Goals of care: Advanced Directive information Advanced Directives 04/26/2017  Does Patient Have a Medical Advance Directive? Yes  Type of Advance Directive Out of facility DNR (pink MOST or yellow form)  Does patient want to make changes to medical advance directive? No - Patient declined  Copy of [place] in Chart? -  Would patient like information on creating a medical advance directive? -  Pre-existing out of facility DNR order (yellow form or pink MOST form) -     Chief Complaint  Patient presents with  . Acute Visit    Hospital followup, status post admission at [facility] 12/10-12/13/18 for respiratory failure with hypoxia    HPI:  Pt is an 83 y.o. female seen today for hospital follow-up, S/P admission at [facility] 12/10-12/13/18 for respiratory failure with hypoxia. She, also, presented to ED with headache, nausea and vomiting. CT head showed chronic small vessel white matter changes without acute bleeding/infarct. Chest x-ray showed RLL consolidation. She was treated for aspiration pneumonia. She had diarrhea and c.difficile was negative. She was started on probiotic and restarted Loperamide with improvement. She was admitted to [place] on 04/25/17 for short-term rehabilitation.  She has a PMH of PAF, hypothyroidism, and hypothyroidism. She was seen in the room  today.    Past Medical History:  Diagnosis Date  . Anxiety   . Atrial fibrillation ([place])   . Breast CA ([place])   . Constipation   . Depression   . Disorder of bone density and structure, unspecified   . Fibromyalgia   . Hyperkalemia   . Hyperlipemia   . Hypertension   . Impaired fasting glucose   . Retention of urine, unspecified   . Scoliosis   . Thyroid disease    Past Surgical History:  Procedure Laterality Date  . HIP FRACTURE SURGERY      Allergies  Allergen Reactions  . Sulfa Antibiotics Other (See Comments)    Per mar    Outpatient Encounter Medications as of 04/26/2017  Medication Sig  . acetaminophen (TYLENOL) 500 MG tablet Take 500 mg by mouth every 4 (four) hours.  Marland Kitchen amiodarone (PACERONE) 100 MG tablet Take 1 tablet (100 mg total) daily by mouth.  Marland Kitchen amLODipine (NORVASC) 10 MG tablet Take 10 mg by mouth daily.    Marland Kitchen amoxicillin-clavulanate (AUGMENTIN) 875-125 MG tablet Take 1 tablet by mouth 2 (two) times daily.  Marland Kitchen apixaban (ELIQUIS) 5 MG TABS tablet Take 5 mg by mouth 2 (two) times daily.  . baclofen (LIORESAL) 10 MG tablet Take 1 tablet (10 mg total) by mouth 3 (three) times daily.  . benazepril (LOTENSIN) 10 MG tablet Take 10 mg by mouth at bedtime.  . calcium carbonate (TUMS - DOSED IN MG ELEMENTAL CALCIUM) 500 MG chewable tablet Chew 2 tablets by mouth as needed for indigestion or heartburn.  . carboxymethylcellulose (REFRESH PLUS) 0.5 % SOLN Place 1 drop into  both eyes daily as needed (dry eyes).   . cholecalciferol (VITAMIN D) 1000 units tablet Take 1,000 Units by mouth daily.  . furosemide (LASIX) 80 MG tablet Take 80 mg by mouth every morning.    . gabapentin (NEURONTIN) 100 MG capsule Take 200 mg 4 (four) times daily by mouth.  Marland Kitchen guaiFENesin (MUCINEX) 600 MG 12 hr tablet Take 600 mg by mouth 2 (two) times daily as needed for cough.  . levothyroxine (SYNTHROID, LEVOTHROID) 50 MCG tablet Take 50 mcg by mouth daily.    Marland Kitchen loperamide (IMODIUM) 2 MG capsule  Take 2 mg by mouth 4 (four) times daily as needed. For loose stools/diarrhea   . menthol-zinc oxide (GOLD BOND) powder Apply 1 application topically daily.  . Multiple Vitamins-Minerals (MULTIVITAMINS THER. W/MINERALS) TABS Take 1 tablet by mouth daily.    Levin Erp SULFATE VAGINAL (TRIMO-SAN) 0.025 % GEL Place 1 application vaginally at bedtime. On Tuesday and friday  . polyethylene glycol (MIRALAX / GLYCOLAX) packet Take 17 g by mouth daily as needed. For constipation.   . potassium chloride SA (K-DUR,KLOR-CON) 20 MEQ tablet Take 20 mEq by mouth daily.    . tamoxifen (NOLVADEX) 10 MG tablet Take 10 mg by mouth daily.    Marland Kitchen tolnaftate (TINACTIN) 1 % powder Apply 1 application topically as needed for itching.  . traMADol (ULTRAM) 50 MG tablet Take 1 tablet (50 mg total) by mouth every 6 (six) hours as needed for moderate pain.  Marland Kitchen venlafaxine (EFFEXOR-XR) 150 MG 24 hr capsule Take 150 mg by mouth daily.    . [DISCONTINUED] benazepril (LOTENSIN) 20 MG tablet Take 20 mg by mouth at bedtime.    No facility-administered encounter medications on file as of 04/26/2017.     Review of Systems  GENERAL: No change in appetite, no fatigue, no weight changes, no fever, chills or weakness MOUTH and THROAT: Denies oral discomfort, gingival pain or bleeding, pain from teeth or hoarseness   RESPIRATORY: no cough, SOB, DOE, wheezing, hemoptysis CARDIAC: No chest pain, edema or palpitations GI: No abdominal pain, diarrhea, constipation, heart burn, nausea or vomiting GU: Denies dysuria, frequency, hematuria, incontinence, or discharge PSYCHIATRIC: Denies feelings of depression or anxiety. No report of hallucinations, insomnia, paranoia, or agitation   Immunization History  Administered Date(s) Administered  . Tdap 11/01/2013   Pertinent  Health Maintenance Due  Topic Date Due  . PNA vac Low Risk Adult (1 of 2 - PCV13) 01/08/1998  . INFLUENZA VACCINE  12/12/2016  . DEXA SCAN  Completed       Vitals:   04/26/17 1348  BP: (!) 148/86  Pulse: 94  Resp: 18  Temp: 98.6 F (37 C)  TempSrc: Oral  SpO2: 95%  Weight: 168 lb 3.4 oz (76.3 kg)  Height: 5' (1.524 m)   Body mass index is 32.85 kg/m.  Physical Exam  GENERAL APPEARANCE: Well nourished. In no acute distress. Obese SKIN:  Skin is warm and dry.  MOUTH and THROAT: Lips are without lesions. Oral mucosa is moist and without lesions. RESPIRATORY: Breathing is even & unlabored, BS CTAB CARDIAC: RRR, no murmur,no extra heart sounds, no edema GI: Abdomen soft, normal BS, no masses, no tenderness EXTREMITIES:  Able to move X 4 extremities PSYCHIATRIC: Alert and oriented X 3. Affect and behavior are appropriate   Labs reviewed: Recent Labs    04/22/17 1129 04/23/17 0458 04/24/17 0414  NA 140 142 139  K 4.6 2.7* 3.4*  CL 105 103 105  CO2 28 28  27  GLUCOSE 121* 110* 96  BUN 13 9 9   CREATININE 0.65 0.46 0.47  CALCIUM 8.2* 8.0* 7.2*  MG  --  2.0  --    Recent Labs    03/27/17 2217 04/22/17 1129 04/23/17 0458  AST 15 32 17  ALT 13* 12* 12*  ALKPHOS 43 41 45  BILITOT 0.7 1.7* 1.2  PROT 6.0* 6.7 6.8  ALBUMIN 3.2* 3.6 3.5   Recent Labs    10/26/16 0027 03/27/17 2217  03/29/17 0451 04/22/17 0920 04/23/17 0458  WBC 7.8 17.4*   < > 12.9* 7.2 12.1*  NEUTROABS 4.4 15.6*  --   --   --   --   HGB 12.6 11.5*   < > 12.7 12.4 13.3  HCT 39.2 36.3   < > 39.9 38.8 42.2  MCV 90.5 91.0   < > 92.1 90.7 90.4  PLT 211 172   < > 178 229 216   < > = values in this interval not displayed.   Lab Results  Component Value Date   TSH 2.575 04/23/2017   Lab Results  Component Value Date   HGBA1C (H) 11/12/2009    6.1 (NOTE)                                                                       According to the ADA Clinical Practice Recommendations for 2011, when HbA1c is used as a screening test:   >=6.5%   Diagnostic of Diabetes Mellitus           (if abnormal result  is confirmed)  5.7-6.4%   Increased risk of  developing Diabetes Mellitus  References:Diagnosis and Classification of Diabetes Mellitus,Diabetes XHBZ,1696,78(LFYBO 1):S62-S69 and Standards of Medical Care in         Diabetes - 2011,Diabetes FBPZ,0258,52  (Suppl 1):S11-S61.    Significant Diagnostic Results in last 30 days:  Dg Chest 2 View  Result Date: 04/22/2017 CLINICAL DATA:  83 year old female with headache and vomiting this morning. Hypoxia despite supplemental oxygen and complaining of right side shortness of breath. EXAM: CHEST  2 VIEW COMPARISON:  03/27/2017 and earlier. FINDINGS: Semi upright AP and lateral views of the chest. Lower lung volumes on the lateral view. Seen on only the frontal view there is mild patchy opacity at the right lung base. No other confluent pulmonary opacity. Stable mediastinal contours. Visualized tracheal air column is within normal limits. No pneumothorax, pulmonary edema or pleural effusion. On the lateral view there is an air-fluid level in a distended probably large bowel loop. No acute osseous abnormality identified. IMPRESSION: 1. Mild right lung base patchy opacity is nonspecific. Consider Aspiration in this clinical setting. 2. No other acute cardiopulmonary abnormality. 3. Gas and fluid dilated bowel loop visible on the lateral view. Recommend Acute Abdominal Series. Electronically Signed   By: Genevie Ann M.D.   On: 04/22/2017 11:06   Dg Chest 2 View  Result Date: 03/27/2017 CLINICAL DATA:  Hypoxia and fever EXAM: CHEST  2 VIEW COMPARISON:  11/21/2016 FINDINGS: Mild cardiomegaly. Aortic atherosclerosis. Streaky atelectasis or infiltrate at the left lung base. No pneumothorax. IMPRESSION: Cardiomegaly.  Streaky atelectasis or infiltrate at the left base Electronically Signed   By: Madie Reno.D.  On: 03/27/2017 22:54   Ct Head Wo Contrast  Result Date: 04/22/2017 CLINICAL DATA:  Dizziness and headache. EXAM: CT HEAD WITHOUT CONTRAST TECHNIQUE: Contiguous axial images were obtained from the base  of the skull through the vertex without intravenous contrast. COMPARISON:  10/26/2016 FINDINGS: Brain: The brainstem, cerebellum, cerebral peduncles, thalami, basal ganglia, basilar cisterns, and ventricular system appear within normal limits. Periventricular white matter and corona radiata hypodensities favor chronic ischemic microvascular white matter disease. No intracranial hemorrhage, mass lesion, or acute CVA. Vascular: Unremarkable Skull: Unremarkable Sinuses/Orbits: Unremarkable Other: No supplemental non-categorized findings. IMPRESSION: 1. No acute intracranial findings. 2. Periventricular white matter and corona radiata hypodensities favor chronic ischemic microvascular white matter disease. Electronically Signed   By: Van Clines M.D.   On: 04/22/2017 10:19   Dg Abd Acute W/chest  Result Date: 04/23/2017 CLINICAL DATA:  Nausea and vomiting. EXAM: DG ABDOMEN ACUTE W/ 1V CHEST COMPARISON:  Chest x-ray 04/22/2017 FINDINGS: Chronic cardiomegaly. Chronic interstitial coarsening at the bases with improved aeration since prior. No edema, effusion, or pneumothorax. No evidence of bowel obstruction or pneumoperitoneum. No generalized stool retention. No concerning mass effect or calcification. Severe scoliosis and lumbar spine degeneration. Left hip hemiarthroplasty with protrusio changes. Cholecystectomy clips.  Vaginal pessary. IMPRESSION: 1. Normal bowel gas pattern. 2. No evidence of acute cardiopulmonary disease when compared to prior. Electronically Signed   By: Monte Fantasia M.D.   On: 04/23/2017 19:32    Assessment/Plan  1. Physical deconditioning - for rehabilitation, PT and OT, for therapeutic strengthening exercises   2. Aspiration pneumonia of right lower lobe, unspecified aspiration pneumonia type ([place]) - continue Augmentin 875-125 mg 1 tab twice a day for 4 days, Mucinex ER 600 mg 1 tab every 12 hours when necessary and start Florastor 250 mg 1 capsule twice a day 7  days   3. Paroxysmal atrial fibrillation ([facility]) - rate controlled, continue amiodarone 100 mg 1 tab daily, Eliquis 5 mg 1 tab twice a day   4. Essential hypertension  - continue amlodipine 10 mg 1 tab daily, benazepril 10 mg 1 tab daily at bedtime   5. Chronic respiratory failure with hypoxia ([facility]) - continue O2 at 2 L/minute via [place] when necessary   6. Hypothyroidism, unspecified type - continue levothyroxine 50 g 1 tab daily Lab Results  Component Value Date   TSH 2.575 04/23/2017     7. Malignant neoplasm of female breast, unspecified estrogen receptor status, unspecified laterality, unspecified site of breast ([place]) - continue tamoxifen 10 mg 1 tab daily   8. Other chronic pain - decrease tramadol 50 mg 1 tab by mouth from every 6 hours when necessary to every 8 hours when necessary, continue baclofen 10 mg 1 tab 3 times a day, gabapentin 100 mg give 2 capsules = 200 mg 4 times a day   9. Chronic diastolic heart failure (HCC) - no SOB, continue Lasix 80 mg 1 tab daily, KCl ER 20 meq 1 tab daily, weigh Q M-W-F, notify provider for weight gain >= 3 lbs, thin U benazepril 10 mg 1 tab daily at bedtime   10. Chronic depression - mood is stable, continue venlafaxine ER 150 mg 1 tab daily    Family/ staff Communication: Discussed plan of care with charge nurse and patient.  Labs/tests ordered:   None  Goals of care:   Short-term care    Durenda Age, NP [facility] [phone] (Monday-Friday 8:00 a.m. - 5:00 p.m.) [phone] (after hours)

## 2017-04-30 ENCOUNTER — Non-Acute Institutional Stay (SKILLED_NURSING_FACILITY): Payer: Medicare Other | Admitting: Internal Medicine

## 2017-04-30 ENCOUNTER — Encounter: Payer: Self-pay | Admitting: Internal Medicine

## 2017-04-30 DIAGNOSIS — L89611 Pressure ulcer of right heel, stage 1: Secondary | ICD-10-CM

## 2017-04-30 DIAGNOSIS — J690 Pneumonitis due to inhalation of food and vomit: Secondary | ICD-10-CM

## 2017-04-30 DIAGNOSIS — J310 Chronic rhinitis: Secondary | ICD-10-CM | POA: Diagnosis not present

## 2017-04-30 DIAGNOSIS — I480 Paroxysmal atrial fibrillation: Secondary | ICD-10-CM

## 2017-04-30 NOTE — Patient Instructions (Signed)
See assessment and plan under each diagnosis in the problem list and acutely for this visit 

## 2017-04-30 NOTE — Assessment & Plan Note (Signed)
Complete Augmentin course

## 2017-04-30 NOTE — Assessment & Plan Note (Signed)
Wound care nurse to continue monitoring and treating

## 2017-04-30 NOTE — Assessment & Plan Note (Signed)
Continue Amiodarone & Eliquis

## 2017-04-30 NOTE — Progress Notes (Signed)
NURSING HOME LOCATION:  [facility] ROOM NUMBER:  307-A  CODE STATUS:  DNR  PCP:  [name], MD  [address] [place] [address]    This is a comprehensive admission note to [facility] performed on this date less than 30 days from date of admission.  Included are preadmission medical/surgical history; reconciled medication list; family history; social history and comprehensive review of systems. Corrections and additions to the records were documented. Comprehensive physical exam was also performed. Additionally a clinical summary was entered for each active diagnosis pertinent to this admission in the Problem List to enhance continuity of care.  HPI: The patient was hospitalized 12/10-12/13/18 with respiratory failure with hypoxia the patient had been started on home oxygen in the recent past prior to admission. She presented to the ED from assisted living facility with headache, nausea, and vomiting. Exam was nonfocal and CT scan of the head revealed chronic small vessel white matter changes without acute change or process. In ED she developed hypoxia requiring 5 L of oxygen. Chest x-ray revealed mild right lower lobe patchy opacity attributed to aspiration pneumonia. These lung changes were improved on F/U imaging 12/11.Zosyn was transitioned to Augmentin to complete a seven-day course. Speech therapy recommended dysphagia 3 diet. On 04/18/2010 she did develop diarrhea, by report this is a recurrent issue; C. difficile was negative. Probiotic was initiated and home loperamide was resumed. A non-blanchable stage I pressure ulcer of the right heel was noted. She also had moisture associated dermatitis with partial thickness wound of the gluteal cleft. Wound care was consulted.Allevyn foam was Rxed for the heels and Criticaid cream for the gluteal cleft dermatitis. The heels were to be floated with a pillow. Labs 12/12 revealed a potassium of 3.4, calcium  7.2, and white count of 12,100. TSH was therapeutic at 2.58.  Past medical and surgical history: Includes hypertension, fibromyalgia, dyslipidemia, paroxysmal A. fib, hypothyroidism, chronic headaches, depression, history of breast cancer and pressure injury of the skin. She is on Eliquis & amiodarone for the PAF. She is on tamoxifen chemotherapy. The patient has had hip fracture surgery.  Social history: Never smoked, nondrinker  Family history: Limited history reviewed  Review of systems: The patient is hard of hearing but alert and oriented. She  gives an excellent history. She states that she went to the hospital because of a headache. She no longer has a headache. She denies any significant cardio pulmonary symptoms and she does validate some postnasal drainage which results in a nonproductive cough. She denies dysphagia or choking with her food. She has scoliosis which decreases her mobility. This is not associated with radicular pain, extremity paresthesias, or stool/urinary incontinence. She states that when she has atrial fibrillation she must "slow down"  But denies exertional dyspnea otherwise. She admits to slight depression. She's had edema for 7 years. She attributes this to her PCP stopping her diuretic.  Constitutional: No fever, significant weight change, fatigue  Eyes: No redness, discharge, pain, vision change ENT/mouth: No  purulent discharge, earache, new change in hearing, sore throat  Cardiovascular: No chest pain,  paroxysmal nocturnal dyspnea, claudication  Respiratory: No sputum production, hemoptysis,  significant snoring, apnea   Gastrointestinal: No heartburn, dysphagia, abdominal pain, nausea / vomiting, rectal bleeding, melena, change in bowels Genitourinary: No dysuria, hematuria, pyuria, incontinence, nocturia Musculoskeletal: No joint stiffness, joint swelling, weakness, pain Dermatologic: No rash, pruritus, change in appearance of skin Neurologic: No  dizziness, syncope, seizures, numbness, tingling Psychiatric:  No significant anxiety, depression, insomnia, anorexiaEndocrine: No change in hair/skin/ nails, excessive thirst, excessive hunger, excessive urination  Hematologic/lymphatic: No significant bruising, lymphadenopathy, abnormal bleeding Allergy/immunology: No itchy/ watery eyes, significant sneezing, urticaria, angioedema  Physical exam:  Pertinent or positive findings: There was lateral ptosis on the right.She has an upper and lower partial. Abdomen is protuberant. She has marked scoliosis. She has 1+ edema to the sock line. There is marked stasis hyperpigmentation of the lower extremities bilaterally. She has symmetric weakness in the upper extremities. The left lower extremity is weaker than the right. Mild mixed arthritic change of the hands. Deep tendon reflexes are decreased at the knees.  General appearance: Adequately nourished; no acute distress, increased work of breathing is present.   Lymphatic: No lymphadenopathy about the head, neck, axilla . Eyes: No conjunctival inflammation or lid edema is present. There is no scleral icterus. Ears:  External ear exam shows no significant lesions or deformities.   Nose:  External nasal examination shows no deformity or inflammation. Nasal mucosa are pink and moist without lesions, exudates Oral exam: Lips and gums are healthy appearing. There is no oropharyngeal erythema or exudate . Neck:  No thyromegaly, masses, tenderness noted.    Heart:  Normal rate and regular rhythm. S1 and S2 normal without gallop, murmur, click, rub .  Lungs: Chest clear to auscultation without wheezes, rhonchi, rales, rubs. Abdomen: Bowel sounds are normal. Abdomen is soft and nontender with no organomegaly, hernias, masses. GU: Deferred  Extremities:  No cyanosis, clubbing Neurologic exam: Balance, Rhomberg, finger to nose testing could not be completed due to clinical state Skin: Warm & dry w/o tenting. No  significant rash.  See clinical summary under each active problem in the Problem List with associated updated therapeutic plan.

## 2017-05-01 DIAGNOSIS — J310 Chronic rhinitis: Secondary | ICD-10-CM | POA: Insufficient documentation

## 2017-05-01 NOTE — Assessment & Plan Note (Signed)
Trial of intra-nasal steroid clinically may be clinically more appropriate than Mucinex long term

## 2017-05-02 ENCOUNTER — Encounter: Payer: Self-pay | Admitting: Internal Medicine

## 2017-05-02 ENCOUNTER — Non-Acute Institutional Stay (SKILLED_NURSING_FACILITY): Payer: Medicare Other | Admitting: Adult Health

## 2017-05-02 ENCOUNTER — Encounter: Payer: Self-pay | Admitting: Adult Health

## 2017-05-02 DIAGNOSIS — F32A Depression, unspecified: Secondary | ICD-10-CM

## 2017-05-02 DIAGNOSIS — F329 Major depressive disorder, single episode, unspecified: Secondary | ICD-10-CM

## 2017-05-02 DIAGNOSIS — C50919 Malignant neoplasm of unspecified site of unspecified female breast: Secondary | ICD-10-CM | POA: Diagnosis not present

## 2017-05-02 DIAGNOSIS — R5381 Other malaise: Secondary | ICD-10-CM | POA: Diagnosis not present

## 2017-05-02 DIAGNOSIS — J690 Pneumonitis due to inhalation of food and vomit: Secondary | ICD-10-CM | POA: Diagnosis not present

## 2017-05-02 DIAGNOSIS — I5032 Chronic diastolic (congestive) heart failure: Secondary | ICD-10-CM

## 2017-05-02 DIAGNOSIS — I480 Paroxysmal atrial fibrillation: Secondary | ICD-10-CM | POA: Diagnosis not present

## 2017-05-02 DIAGNOSIS — G8929 Other chronic pain: Secondary | ICD-10-CM | POA: Diagnosis not present

## 2017-05-02 DIAGNOSIS — E039 Hypothyroidism, unspecified: Secondary | ICD-10-CM | POA: Diagnosis not present

## 2017-05-02 DIAGNOSIS — I10 Essential (primary) hypertension: Secondary | ICD-10-CM | POA: Diagnosis not present

## 2017-05-02 LAB — CBC AND DIFFERENTIAL
HCT: 39 (ref 36–46)
Hemoglobin: 12.3 (ref 12.0–16.0)
Neutrophils Absolute: 5
PLATELETS: 211 (ref 150–399)
WBC: 7.8

## 2017-05-02 LAB — BASIC METABOLIC PANEL
BUN: 16 (ref 4–21)
Creatinine: 0.5 (ref 0.5–1.1)
Glucose: 99
Potassium: 3.6 (ref 3.4–5.3)
Sodium: 143 (ref 137–147)

## 2017-05-02 MED ORDER — TRAMADOL HCL 50 MG PO TABS: 50.0000 mg | ORAL_TABLET | Freq: Three times a day (TID) | ORAL | 0 refills | Status: DC | PRN

## 2017-05-02 NOTE — Progress Notes (Signed)
This encounter was created in error - please disregard.

## 2017-05-02 NOTE — Progress Notes (Signed)
Location:  [facility] Room Number: 307-A Place of Service:  SNF (31) Provider:  Durenda Age, NP  Patient Care Team: [name], MD as PCP - General [facility])  Extended Emergency Contact Information Primary Emergency Contact: Muir,Eileen Address: [address]          [place], [place] [address] Johnnette Litter of [place] Phone: [phone] Relation: Relative Secondary Emergency Contact: Gharibian,Edward Address: [address] [place] [address] [address] Johnnette Litter of [place] Phone: [phone] Relation: Son  Code Status:  DNR  Goals of care: Advanced Directive information Advanced Directives 05/02/2017  Does Patient Have a Medical Advance Directive? Yes  Type of Advance Directive Out of facility DNR (pink MOST or yellow form)  Does patient want to make changes to medical advance directive? Yes (Inpatient - patient requests chaplain consult to change a medical advance directive)  Copy of [place] in Chart? -  Would patient like information on creating a medical advance directive? -  Pre-existing out of facility DNR order (yellow form or pink MOST form) -     Chief Complaint  Patient presents with  . Discharge Note    Patient is discharging to [facility] 05/03/17    HPI:  Pt is an 83 y.o. Downs seen today for a discharge visit.  She will discharge back to [place] on 05/03/17 with home health PT, OT, and nursing services.    She has been admitted to [facility] on 01/28/17 from [facility] with respiratory failure with hypoxia. She also presented to ED with headache, nausea and vomiting. CT head showed chronic small vessel white matter changes without acute bleeding/infarct. Chest x-ray showed RLL consolidation. She was treated for aspiration pneumonia. She has a PMH of PAF, hypothyroidism, HTN, fibromyalgia, history of breast CA, chronic headaches, and depression. She was seen  in the room today.   Patient was admitted to this facility for short-term rehabilitation after the patient's recent hospitalization.  Patient has completed SNF rehabilitation and therapy has cleared the patient for discharge.   Past Medical History:  Diagnosis Date  . Anxiety   . Atrial fibrillation ([place])   . Breast CA ([place])   . Constipation   . Depression   . Disorder of bone density and structure, unspecified   . Fibromyalgia   . Hyperkalemia   . Hyperlipemia   . Hypertension   . Impaired fasting glucose   . Retention of urine, unspecified   . Scoliosis   . Thyroid disease    Past Surgical History:  Procedure Laterality Date  . HIP FRACTURE SURGERY      Allergies  Allergen Reactions  . Sulfa Antibiotics Other (See Comments)    Per mar    Outpatient Encounter Medications as of 05/02/2017  Medication Sig  . acetaminophen (TYLENOL) 500 MG tablet Take 500 mg by mouth every 4 (four) hours.  Marland Kitchen amiodarone (PACERONE) 100 MG tablet Take 1 tablet (100 mg total) daily by mouth.  Marland Kitchen amLODipine (NORVASC) 10 MG tablet Take 10 mg by mouth daily.    Marland Kitchen apixaban (ELIQUIS) 5 MG TABS tablet Take 5 mg by mouth 2 (two) times daily.  . baclofen (LIORESAL) 10 MG tablet Take 1 tablet (10 mg total) by mouth 3 (three) times daily.  . benazepril (LOTENSIN) 20 MG tablet Take 20 mg by mouth at bedtime.  . calcium carbonate (TUMS - DOSED IN MG ELEMENTAL CALCIUM) 500 MG chewable tablet  Chew 2 tablets by mouth as needed for indigestion or heartburn.  . carboxymethylcellulose (REFRESH PLUS) 0.5 % SOLN Place 1 drop into both eyes daily as needed (dry eyes).   . cholecalciferol (VITAMIN D) 1000 units tablet Take 1,000 Units by mouth daily.  . furosemide (LASIX) 80 MG tablet Take 80 mg by mouth every morning.    . gabapentin (NEURONTIN) 100 MG capsule Take 200 mg 4 (four) times daily by mouth.  Marland Kitchen guaiFENesin (MUCINEX) 600 MG 12 hr tablet Take 600 mg by mouth 2 (two) times daily as needed for cough.  .  levothyroxine (SYNTHROID, LEVOTHROID) 50 MCG tablet Take 50 mcg by mouth daily.    Marland Kitchen loperamide (IMODIUM) 2 MG capsule Take 2 mg by mouth 4 (four) times daily as needed. For loose stools/diarrhea   . menthol-zinc oxide (GOLD BOND) powder Apply 1 application topically daily.  . Multiple Vitamins-Minerals (MULTIVITAMINS THER. W/MINERALS) TABS Take 1 tablet by mouth daily.    Levin Erp SULFATE VAGINAL (TRIMO-SAN) 0.025 % GEL Place 1 application vaginally at bedtime. On Tuesdays and Fridays  . polyethylene glycol (MIRALAX / GLYCOLAX) packet Take 17 g by mouth daily as needed. For constipation.   . potassium chloride SA (K-DUR,KLOR-CON) 20 MEQ tablet Take 20 mEq by mouth daily.    Marland Kitchen saccharomyces boulardii (FLORASTOR) 250 MG capsule Take 250 mg by mouth 2 (two) times daily.  . tamoxifen (NOLVADEX) 10 MG tablet Take 10 mg by mouth daily.    Marland Kitchen tolnaftate (TINACTIN) 1 % powder Apply 1 application topically as needed for itching.  . traMADol (ULTRAM) 50 MG tablet Take 50 mg by mouth every 8 (eight) hours as needed for moderate pain.  Marland Kitchen venlafaxine (EFFEXOR-XR) 150 MG 24 hr capsule Take 150 mg by mouth daily.     No facility-administered encounter medications on file as of 05/02/2017.     Review of Systems  GENERAL: No change in appetite, no fatigue, no weight changes, no fever, chills or weakness  MOUTH and THROAT: Denies oral discomfort, gingival pain or bleedingRESPIRATORY: no cough, SOB, DOE, wheezing, hemoptysis CARDIAC: No chest pain, or palpitations GI: No abdominal pain, diarrhea, constipation, heart burn, nausea or vomiting GU: Denies dysuria, frequency, hematuria, incontinence, or discharge PSYCHIATRIC: Denies feelings of depression or anxiety. No report of hallucinations, insomnia, paranoia, or agitation   Immunization History  Administered Date(s) Administered  . Tdap 11/01/2013   Pertinent  Health Maintenance Due  Topic Date Due  . PNA vac Low Risk Adult (1 of 2 - PCV13)  01/08/1998  . INFLUENZA VACCINE  12/12/2016  . DEXA SCAN  Completed      Vitals:   05/02/17 1116  BP: 136/62  Pulse: 68  Resp: 17  Temp: (!) 97.1 F (36.2 C)  TempSrc: Oral  SpO2: 93%  Weight: 168 lb 3.4 oz (76.3 kg)  Height: 5' (1.524 m)   Body mass index is 32.85 kg/m.  Physical Exam  GENERAL APPEARANCE: Well nourished. In no acute distress. Obese SKIN:  Skin is warm and dry.  MOUTH and THROAT: Lips are without lesions. Oral mucosa is moist and without lesions.  RESPIRATORY: Breathing is even & unlabored, BS CTAB CARDIAC: RRR, +murmur,no extra heart sounds, RLE 2+ edema and LLE trace edema GI: Abdomen soft, normal BS, no masses, no tenderness, no hepatomegaly, no splenomegaly EXTREMITIES: Able to move X 4 extremities, walks with walker PSYCHIATRIC: Alert and oriented X 3. Affect and behavior are appropriate   Labs reviewed: Recent Labs    04/22/17  1129 04/23/17 0458 04/24/17 0414  NA 140 142 139  K 4.6 2.7* 3.4*  CL 105 103 105  CO2 28 28 27   GLUCOSE 121* 110* 96  BUN 13 9 9   CREATININE 0.65 0.46 0.47  CALCIUM 8.2* 8.0* 7.2*  MG  --  2.0  --    Recent Labs    03/27/17 2217 04/22/17 1129 04/23/17 0458  AST 15 32 17  ALT 13* 12* 12*  ALKPHOS 43 41 45  BILITOT 0.7 1.7* 1.2  PROT 6.0* 6.7 6.8  ALBUMIN 3.2* 3.6 3.5   Recent Labs    10/26/16 0027 03/27/17 2217  03/29/17 0451 04/22/17 0920 04/23/17 0458  WBC 7.8 17.4*   < > 12.9* 7.2 12.1*  NEUTROABS 4.4 15.6*  --   --   --   --   HGB 12.6 11.5*   < > 12.7 12.4 13.3  HCT 39.2 36.3   < > 39.9 38.8 42.2  MCV 90.5 91.0   < > 92.1 90.7 90.4  PLT 211 172   < > 178 229 216   < > = values in this interval not displayed.   Lab Results  Component Value Date   TSH 2.575 04/23/2017   Lab Results  Component Value Date   HGBA1C (H) 11/12/2009    6.1 (NOTE)                                                                       According to the ADA Clinical Practice Recommendations for 2011, when HbA1c  is used as a screening test:   >=6.5%   Diagnostic of Diabetes Mellitus           (if abnormal result  is confirmed)  5.7-6.4%   Increased risk of developing Diabetes Mellitus  References:Diagnosis and Classification of Diabetes Mellitus,Diabetes [id number],1194,17(EYCXK 1):S62-S69 and Standards of Medical Care in         Diabetes - 2011,Diabetes [id number],5631,49  (Suppl 1):S11-S61.    Significant Diagnostic Results in last 30 days:  Dg Chest 2 View  Result Date: 04/22/2017 CLINICAL DATA:  83 year old Downs with headache and vomiting this morning. Hypoxia despite supplemental oxygen and complaining of right side shortness of breath. EXAM: CHEST  2 VIEW COMPARISON:  03/27/2017 and earlier. FINDINGS: Semi upright AP and lateral views of the chest. Lower lung volumes on the lateral view. Seen on only the frontal view there is mild patchy opacity at the right lung base. No other confluent pulmonary opacity. Stable mediastinal contours. Visualized tracheal air column is within normal limits. No pneumothorax, pulmonary edema or pleural effusion. On the lateral view there is an air-fluid level in a distended probably large bowel loop. No acute osseous abnormality identified. IMPRESSION: 1. Mild right lung base patchy opacity is nonspecific. Consider Aspiration in this clinical setting. 2. No other acute cardiopulmonary abnormality. 3. Gas and fluid dilated bowel loop visible on the lateral view. Recommend Acute Abdominal Series. Electronically Signed   By: Genevie Ann M.D.   On: 04/22/2017 11:06   Ct Head Wo Contrast  Result Date: 04/22/2017 CLINICAL DATA:  Dizziness and headache. EXAM: CT HEAD WITHOUT CONTRAST TECHNIQUE: Contiguous axial images were obtained from the base of the skull through the vertex without intravenous  contrast. COMPARISON:  10/26/2016 FINDINGS: Brain: The brainstem, cerebellum, cerebral peduncles, thalami, basal ganglia, basilar cisterns, and ventricular system appear within normal limits.  Periventricular white matter and corona radiata hypodensities favor chronic ischemic microvascular white matter disease. No intracranial hemorrhage, mass lesion, or acute CVA. Vascular: Unremarkable Skull: Unremarkable Sinuses/Orbits: Unremarkable Other: No supplemental non-categorized findings. IMPRESSION: 1. No acute intracranial findings. 2. Periventricular white matter and corona radiata hypodensities favor chronic ischemic microvascular white matter disease. Electronically Signed   By: Van Clines M.D.   On: 04/22/2017 10:19   Dg Abd Acute W/chest  Result Date: 04/23/2017 CLINICAL DATA:  Nausea and vomiting. EXAM: DG ABDOMEN ACUTE W/ 1V CHEST COMPARISON:  Chest x-ray 04/22/2017 FINDINGS: Chronic cardiomegaly. Chronic interstitial coarsening at the bases with improved aeration since prior. No edema, effusion, or pneumothorax. No evidence of bowel obstruction or pneumoperitoneum. No generalized stool retention. No concerning mass effect or calcification. Severe scoliosis and lumbar spine degeneration. Left hip hemiarthroplasty with protrusio changes. Cholecystectomy clips.  Vaginal pessary. IMPRESSION: 1. Normal bowel gas pattern. 2. No evidence of acute cardiopulmonary disease when compared to prior. Electronically Signed   By: Monte Fantasia M.D.   On: 04/23/2017 19:32    Assessment/Plan  1. Physical deconditioning - for Home health PT, OT and Nursing, for therapeutic strengthening exercises, fall precautions   2. Aspiration pneumonia of right lower lobe, unspecified aspiration pneumonia type ([place]) - completed Augmentin therapy, resolved   3. Hypothyroidism, unspecified type - continue levothyroxine 50 g 1 tab daily Lab Results  Component Value Date   TSH 2.575 04/23/2017    4. Paroxysmal atrial fibrillation ([facility]) - rate controlled, continue Eliquis 5 mg 1 tab twice a day, amiodarone 100 mg 1 tab daily   5. Other chronic pain - well-controlled, continue gabapentin 100 mg give 2  capsules = 200 mg 4 times a day and Baclofen  10 mg 1 tab TID, and Tramadol 50 mg 1 tab Q 8 hours   6. Chronic depression - mood is stable, continue venlafaxine ER 150 mg 1 tab daily   7. Malignant neoplasm of Downs breast, unspecified estrogen receptor status, unspecified laterality, unspecified site of breast ([place]) - continue tamoxifen 10 mg 1 tab daily   8. Essential hypertension - well-controlled, continue amlodipine 10 mg 1 tab daily and benazepril 20 mg 1 tab daily    9. Chronic diastolic heart failure - no SOB, continue furosemide 80 mg 1 tablet daily      I have filled out patient's discharge paperwork and written prescriptions.  Patient will receive home health PT, OT, and Nursing.  DME provided:  None  Total discharge time: Greater than 30 minutes Greater than 50% was spent in counseling and coordination of care.    Discharge time involved coordination of the discharge process with social worker, nursing staff and therapy department. Medical justification for home health services verified.    Durenda Age, NP [facility] [phone] (Monday-Friday 8:00 a.m. - 5:00 p.m.) [phone] (after hours)

## 2017-05-05 DIAGNOSIS — I110 Hypertensive heart disease with heart failure: Secondary | ICD-10-CM | POA: Diagnosis not present

## 2017-05-05 DIAGNOSIS — G8929 Other chronic pain: Secondary | ICD-10-CM | POA: Diagnosis not present

## 2017-05-05 DIAGNOSIS — R1312 Dysphagia, oropharyngeal phase: Secondary | ICD-10-CM | POA: Diagnosis not present

## 2017-05-05 DIAGNOSIS — R2681 Unsteadiness on feet: Secondary | ICD-10-CM | POA: Diagnosis not present

## 2017-05-05 DIAGNOSIS — Z7901 Long term (current) use of anticoagulants: Secondary | ICD-10-CM | POA: Diagnosis not present

## 2017-05-05 DIAGNOSIS — I482 Chronic atrial fibrillation: Secondary | ICD-10-CM | POA: Diagnosis not present

## 2017-05-05 DIAGNOSIS — Z9981 Dependence on supplemental oxygen: Secondary | ICD-10-CM | POA: Diagnosis not present

## 2017-05-05 DIAGNOSIS — I5032 Chronic diastolic (congestive) heart failure: Secondary | ICD-10-CM | POA: Diagnosis not present

## 2017-05-05 DIAGNOSIS — M797 Fibromyalgia: Secondary | ICD-10-CM | POA: Diagnosis not present

## 2017-05-05 DIAGNOSIS — M6281 Muscle weakness (generalized): Secondary | ICD-10-CM | POA: Diagnosis not present

## 2017-05-05 DIAGNOSIS — Z8701 Personal history of pneumonia (recurrent): Secondary | ICD-10-CM | POA: Diagnosis not present

## 2017-05-05 DIAGNOSIS — Z853 Personal history of malignant neoplasm of breast: Secondary | ICD-10-CM | POA: Diagnosis not present

## 2017-05-06 DIAGNOSIS — R2681 Unsteadiness on feet: Secondary | ICD-10-CM | POA: Diagnosis not present

## 2017-05-06 DIAGNOSIS — I110 Hypertensive heart disease with heart failure: Secondary | ICD-10-CM | POA: Diagnosis not present

## 2017-05-06 DIAGNOSIS — I5032 Chronic diastolic (congestive) heart failure: Secondary | ICD-10-CM | POA: Diagnosis not present

## 2017-05-06 DIAGNOSIS — R1312 Dysphagia, oropharyngeal phase: Secondary | ICD-10-CM | POA: Diagnosis not present

## 2017-05-06 DIAGNOSIS — G8929 Other chronic pain: Secondary | ICD-10-CM | POA: Diagnosis not present

## 2017-05-06 DIAGNOSIS — M6281 Muscle weakness (generalized): Secondary | ICD-10-CM | POA: Diagnosis not present

## 2017-05-08 DIAGNOSIS — R1312 Dysphagia, oropharyngeal phase: Secondary | ICD-10-CM | POA: Diagnosis not present

## 2017-05-08 DIAGNOSIS — I110 Hypertensive heart disease with heart failure: Secondary | ICD-10-CM | POA: Diagnosis not present

## 2017-05-08 DIAGNOSIS — G8929 Other chronic pain: Secondary | ICD-10-CM | POA: Diagnosis not present

## 2017-05-08 DIAGNOSIS — I5032 Chronic diastolic (congestive) heart failure: Secondary | ICD-10-CM | POA: Diagnosis not present

## 2017-05-08 DIAGNOSIS — M6281 Muscle weakness (generalized): Secondary | ICD-10-CM | POA: Diagnosis not present

## 2017-05-08 DIAGNOSIS — R2681 Unsteadiness on feet: Secondary | ICD-10-CM | POA: Diagnosis not present

## 2017-05-09 DIAGNOSIS — F339 Major depressive disorder, recurrent, unspecified: Secondary | ICD-10-CM | POA: Diagnosis not present

## 2017-05-10 DIAGNOSIS — I110 Hypertensive heart disease with heart failure: Secondary | ICD-10-CM | POA: Diagnosis not present

## 2017-05-10 DIAGNOSIS — R2681 Unsteadiness on feet: Secondary | ICD-10-CM | POA: Diagnosis not present

## 2017-05-10 DIAGNOSIS — R1312 Dysphagia, oropharyngeal phase: Secondary | ICD-10-CM | POA: Diagnosis not present

## 2017-05-10 DIAGNOSIS — I10 Essential (primary) hypertension: Secondary | ICD-10-CM | POA: Diagnosis not present

## 2017-05-10 DIAGNOSIS — G8929 Other chronic pain: Secondary | ICD-10-CM | POA: Diagnosis not present

## 2017-05-10 DIAGNOSIS — F331 Major depressive disorder, recurrent, moderate: Secondary | ICD-10-CM | POA: Diagnosis not present

## 2017-05-10 DIAGNOSIS — M6281 Muscle weakness (generalized): Secondary | ICD-10-CM | POA: Diagnosis not present

## 2017-05-10 DIAGNOSIS — I5032 Chronic diastolic (congestive) heart failure: Secondary | ICD-10-CM | POA: Diagnosis not present

## 2017-05-15 DIAGNOSIS — M6281 Muscle weakness (generalized): Secondary | ICD-10-CM | POA: Diagnosis not present

## 2017-05-15 DIAGNOSIS — I5032 Chronic diastolic (congestive) heart failure: Secondary | ICD-10-CM | POA: Diagnosis not present

## 2017-05-15 DIAGNOSIS — R1312 Dysphagia, oropharyngeal phase: Secondary | ICD-10-CM | POA: Diagnosis not present

## 2017-05-15 DIAGNOSIS — R2681 Unsteadiness on feet: Secondary | ICD-10-CM | POA: Diagnosis not present

## 2017-05-15 DIAGNOSIS — G8929 Other chronic pain: Secondary | ICD-10-CM | POA: Diagnosis not present

## 2017-05-15 DIAGNOSIS — I110 Hypertensive heart disease with heart failure: Secondary | ICD-10-CM | POA: Diagnosis not present

## 2017-05-16 ENCOUNTER — Ambulatory Visit
Admission: RE | Admit: 2017-05-16 | Discharge: 2017-05-16 | Disposition: A | Discharge status: Home / Self Care | Payer: Medicare Other | Source: Ambulatory Visit | Attending: Family Medicine | Admitting: Family Medicine

## 2017-05-16 ENCOUNTER — Other Ambulatory Visit: Payer: Self-pay | Admitting: Family Medicine

## 2017-05-16 DIAGNOSIS — I119 Hypertensive heart disease without heart failure: Secondary | ICD-10-CM | POA: Diagnosis not present

## 2017-05-16 DIAGNOSIS — J690 Pneumonitis due to inhalation of food and vomit: Secondary | ICD-10-CM | POA: Diagnosis not present

## 2017-05-16 DIAGNOSIS — C50919 Malignant neoplasm of unspecified site of unspecified female breast: Secondary | ICD-10-CM | POA: Diagnosis not present

## 2017-05-16 DIAGNOSIS — I5032 Chronic diastolic (congestive) heart failure: Secondary | ICD-10-CM | POA: Diagnosis not present

## 2017-05-16 DIAGNOSIS — M6281 Muscle weakness (generalized): Secondary | ICD-10-CM | POA: Diagnosis not present

## 2017-05-16 DIAGNOSIS — I482 Chronic atrial fibrillation: Secondary | ICD-10-CM | POA: Diagnosis not present

## 2017-05-16 DIAGNOSIS — I890 Lymphedema, not elsewhere classified: Secondary | ICD-10-CM | POA: Diagnosis not present

## 2017-05-16 DIAGNOSIS — R2681 Unsteadiness on feet: Secondary | ICD-10-CM | POA: Diagnosis not present

## 2017-05-16 DIAGNOSIS — B029 Zoster without complications: Secondary | ICD-10-CM | POA: Diagnosis not present

## 2017-05-16 DIAGNOSIS — R5381 Other malaise: Secondary | ICD-10-CM | POA: Diagnosis not present

## 2017-05-16 DIAGNOSIS — J9691 Respiratory failure, unspecified with hypoxia: Secondary | ICD-10-CM | POA: Diagnosis not present

## 2017-05-16 DIAGNOSIS — R1312 Dysphagia, oropharyngeal phase: Secondary | ICD-10-CM | POA: Diagnosis not present

## 2017-05-16 DIAGNOSIS — J189 Pneumonia, unspecified organism: Secondary | ICD-10-CM

## 2017-05-16 DIAGNOSIS — E039 Hypothyroidism, unspecified: Secondary | ICD-10-CM | POA: Diagnosis not present

## 2017-05-16 DIAGNOSIS — R918 Other nonspecific abnormal finding of lung field: Secondary | ICD-10-CM | POA: Diagnosis not present

## 2017-05-16 DIAGNOSIS — H6123 Impacted cerumen, bilateral: Secondary | ICD-10-CM | POA: Diagnosis not present

## 2017-05-16 DIAGNOSIS — I110 Hypertensive heart disease with heart failure: Secondary | ICD-10-CM | POA: Diagnosis not present

## 2017-05-16 DIAGNOSIS — M810 Age-related osteoporosis without current pathological fracture: Secondary | ICD-10-CM | POA: Diagnosis not present

## 2017-05-16 DIAGNOSIS — G8929 Other chronic pain: Secondary | ICD-10-CM | POA: Diagnosis not present

## 2017-05-16 DIAGNOSIS — L0390 Cellulitis, unspecified: Secondary | ICD-10-CM | POA: Diagnosis not present

## 2017-05-17 DIAGNOSIS — R1312 Dysphagia, oropharyngeal phase: Secondary | ICD-10-CM | POA: Diagnosis not present

## 2017-05-17 DIAGNOSIS — I5032 Chronic diastolic (congestive) heart failure: Secondary | ICD-10-CM | POA: Diagnosis not present

## 2017-05-17 DIAGNOSIS — G8929 Other chronic pain: Secondary | ICD-10-CM | POA: Diagnosis not present

## 2017-05-17 DIAGNOSIS — I110 Hypertensive heart disease with heart failure: Secondary | ICD-10-CM | POA: Diagnosis not present

## 2017-05-17 DIAGNOSIS — R2681 Unsteadiness on feet: Secondary | ICD-10-CM | POA: Diagnosis not present

## 2017-05-17 DIAGNOSIS — M6281 Muscle weakness (generalized): Secondary | ICD-10-CM | POA: Diagnosis not present

## 2017-05-20 ENCOUNTER — Other Ambulatory Visit: Payer: Self-pay | Admitting: Family Medicine

## 2017-05-20 DIAGNOSIS — G8929 Other chronic pain: Secondary | ICD-10-CM | POA: Diagnosis not present

## 2017-05-20 DIAGNOSIS — I5032 Chronic diastolic (congestive) heart failure: Secondary | ICD-10-CM | POA: Diagnosis not present

## 2017-05-20 DIAGNOSIS — R9389 Abnormal findings on diagnostic imaging of other specified body structures: Secondary | ICD-10-CM

## 2017-05-20 DIAGNOSIS — R2681 Unsteadiness on feet: Secondary | ICD-10-CM | POA: Diagnosis not present

## 2017-05-20 DIAGNOSIS — R1312 Dysphagia, oropharyngeal phase: Secondary | ICD-10-CM | POA: Diagnosis not present

## 2017-05-20 DIAGNOSIS — M6281 Muscle weakness (generalized): Secondary | ICD-10-CM | POA: Diagnosis not present

## 2017-05-20 DIAGNOSIS — I110 Hypertensive heart disease with heart failure: Secondary | ICD-10-CM | POA: Diagnosis not present

## 2017-05-21 DIAGNOSIS — M6281 Muscle weakness (generalized): Secondary | ICD-10-CM | POA: Diagnosis not present

## 2017-05-21 DIAGNOSIS — I5032 Chronic diastolic (congestive) heart failure: Secondary | ICD-10-CM | POA: Diagnosis not present

## 2017-05-21 DIAGNOSIS — R1312 Dysphagia, oropharyngeal phase: Secondary | ICD-10-CM | POA: Diagnosis not present

## 2017-05-21 DIAGNOSIS — G8929 Other chronic pain: Secondary | ICD-10-CM | POA: Diagnosis not present

## 2017-05-21 DIAGNOSIS — I110 Hypertensive heart disease with heart failure: Secondary | ICD-10-CM | POA: Diagnosis not present

## 2017-05-21 DIAGNOSIS — R2681 Unsteadiness on feet: Secondary | ICD-10-CM | POA: Diagnosis not present

## 2017-05-23 DIAGNOSIS — G8929 Other chronic pain: Secondary | ICD-10-CM | POA: Diagnosis not present

## 2017-05-23 DIAGNOSIS — M6281 Muscle weakness (generalized): Secondary | ICD-10-CM | POA: Diagnosis not present

## 2017-05-23 DIAGNOSIS — R2681 Unsteadiness on feet: Secondary | ICD-10-CM | POA: Diagnosis not present

## 2017-05-23 DIAGNOSIS — R1312 Dysphagia, oropharyngeal phase: Secondary | ICD-10-CM | POA: Diagnosis not present

## 2017-05-23 DIAGNOSIS — I5032 Chronic diastolic (congestive) heart failure: Secondary | ICD-10-CM | POA: Diagnosis not present

## 2017-05-23 DIAGNOSIS — I110 Hypertensive heart disease with heart failure: Secondary | ICD-10-CM | POA: Diagnosis not present

## 2017-05-24 ENCOUNTER — Ambulatory Visit
Admission: RE | Admit: 2017-05-24 | Discharge: 2017-05-24 | Disposition: A | Discharge status: Home / Self Care | Payer: Medicare Other | Source: Ambulatory Visit | Attending: Family Medicine | Admitting: Family Medicine

## 2017-05-24 DIAGNOSIS — R9389 Abnormal findings on diagnostic imaging of other specified body structures: Secondary | ICD-10-CM

## 2017-05-24 DIAGNOSIS — J439 Emphysema, unspecified: Secondary | ICD-10-CM | POA: Diagnosis not present

## 2017-05-24 MED ORDER — IOPAMIDOL (ISOVUE-300) INJECTION 61%
75.0000 mL | Freq: Once | INTRAVENOUS | Status: AC | PRN
  Administered 2017-05-24: 75 mL via INTRAVENOUS

## 2017-05-25 DIAGNOSIS — M6281 Muscle weakness (generalized): Secondary | ICD-10-CM | POA: Diagnosis not present

## 2017-05-25 DIAGNOSIS — R1312 Dysphagia, oropharyngeal phase: Secondary | ICD-10-CM | POA: Diagnosis not present

## 2017-05-25 DIAGNOSIS — I5032 Chronic diastolic (congestive) heart failure: Secondary | ICD-10-CM | POA: Diagnosis not present

## 2017-05-25 DIAGNOSIS — I110 Hypertensive heart disease with heart failure: Secondary | ICD-10-CM | POA: Diagnosis not present

## 2017-05-25 DIAGNOSIS — G8929 Other chronic pain: Secondary | ICD-10-CM | POA: Diagnosis not present

## 2017-05-25 DIAGNOSIS — R2681 Unsteadiness on feet: Secondary | ICD-10-CM | POA: Diagnosis not present

## 2017-05-27 DIAGNOSIS — R1312 Dysphagia, oropharyngeal phase: Secondary | ICD-10-CM | POA: Diagnosis not present

## 2017-05-27 DIAGNOSIS — R2681 Unsteadiness on feet: Secondary | ICD-10-CM | POA: Diagnosis not present

## 2017-05-27 DIAGNOSIS — G8929 Other chronic pain: Secondary | ICD-10-CM | POA: Diagnosis not present

## 2017-05-27 DIAGNOSIS — M6281 Muscle weakness (generalized): Secondary | ICD-10-CM | POA: Diagnosis not present

## 2017-05-27 DIAGNOSIS — I110 Hypertensive heart disease with heart failure: Secondary | ICD-10-CM | POA: Diagnosis not present

## 2017-05-27 DIAGNOSIS — I5032 Chronic diastolic (congestive) heart failure: Secondary | ICD-10-CM | POA: Diagnosis not present

## 2017-05-28 DIAGNOSIS — G8929 Other chronic pain: Secondary | ICD-10-CM | POA: Diagnosis not present

## 2017-05-28 DIAGNOSIS — R1312 Dysphagia, oropharyngeal phase: Secondary | ICD-10-CM | POA: Diagnosis not present

## 2017-05-28 DIAGNOSIS — I110 Hypertensive heart disease with heart failure: Secondary | ICD-10-CM | POA: Diagnosis not present

## 2017-05-28 DIAGNOSIS — R2681 Unsteadiness on feet: Secondary | ICD-10-CM | POA: Diagnosis not present

## 2017-05-28 DIAGNOSIS — M6281 Muscle weakness (generalized): Secondary | ICD-10-CM | POA: Diagnosis not present

## 2017-05-28 DIAGNOSIS — I5032 Chronic diastolic (congestive) heart failure: Secondary | ICD-10-CM | POA: Diagnosis not present

## 2017-05-28 DIAGNOSIS — F339 Major depressive disorder, recurrent, unspecified: Secondary | ICD-10-CM | POA: Diagnosis not present

## 2017-05-30 DIAGNOSIS — I110 Hypertensive heart disease with heart failure: Secondary | ICD-10-CM | POA: Diagnosis not present

## 2017-05-30 DIAGNOSIS — G8929 Other chronic pain: Secondary | ICD-10-CM | POA: Diagnosis not present

## 2017-05-30 DIAGNOSIS — R1312 Dysphagia, oropharyngeal phase: Secondary | ICD-10-CM | POA: Diagnosis not present

## 2017-05-30 DIAGNOSIS — R2681 Unsteadiness on feet: Secondary | ICD-10-CM | POA: Diagnosis not present

## 2017-05-30 DIAGNOSIS — M6281 Muscle weakness (generalized): Secondary | ICD-10-CM | POA: Diagnosis not present

## 2017-05-30 DIAGNOSIS — I5032 Chronic diastolic (congestive) heart failure: Secondary | ICD-10-CM | POA: Diagnosis not present

## 2017-05-31 ENCOUNTER — Other Ambulatory Visit (HOSPITAL_COMMUNITY): Payer: Self-pay

## 2017-05-31 ENCOUNTER — Ambulatory Visit (HOSPITAL_COMMUNITY)
Admission: RE | Admit: 2017-05-31 | Discharge: 2017-05-31 | Disposition: A | Discharge status: Home / Self Care | Payer: Medicare Other | Source: Ambulatory Visit | Attending: Family Medicine | Admitting: Family Medicine

## 2017-05-31 DIAGNOSIS — R609 Edema, unspecified: Secondary | ICD-10-CM

## 2017-05-31 DIAGNOSIS — M7989 Other specified soft tissue disorders: Secondary | ICD-10-CM | POA: Diagnosis not present

## 2017-05-31 NOTE — Progress Notes (Signed)
*  Preliminary Results* Bilateral lower extremity venous duplex completed. There is no obvious evidence of deep vein thrombosis involving bilateral lower extremities. There is no evidence of Baker's cyst bilaterally.  05/31/2017 2:48 PM Maudry Mayhew, BS, RVT, RDCS, RDMS

## 2017-06-04 DIAGNOSIS — R2681 Unsteadiness on feet: Secondary | ICD-10-CM | POA: Diagnosis not present

## 2017-06-04 DIAGNOSIS — G8929 Other chronic pain: Secondary | ICD-10-CM | POA: Diagnosis not present

## 2017-06-04 DIAGNOSIS — R1312 Dysphagia, oropharyngeal phase: Secondary | ICD-10-CM | POA: Diagnosis not present

## 2017-06-04 DIAGNOSIS — I110 Hypertensive heart disease with heart failure: Secondary | ICD-10-CM | POA: Diagnosis not present

## 2017-06-04 DIAGNOSIS — F339 Major depressive disorder, recurrent, unspecified: Secondary | ICD-10-CM | POA: Diagnosis not present

## 2017-06-04 DIAGNOSIS — I5032 Chronic diastolic (congestive) heart failure: Secondary | ICD-10-CM | POA: Diagnosis not present

## 2017-06-04 DIAGNOSIS — M6281 Muscle weakness (generalized): Secondary | ICD-10-CM | POA: Diagnosis not present

## 2017-06-05 DIAGNOSIS — R1312 Dysphagia, oropharyngeal phase: Secondary | ICD-10-CM | POA: Diagnosis not present

## 2017-06-05 DIAGNOSIS — G8929 Other chronic pain: Secondary | ICD-10-CM | POA: Diagnosis not present

## 2017-06-05 DIAGNOSIS — I5032 Chronic diastolic (congestive) heart failure: Secondary | ICD-10-CM | POA: Diagnosis not present

## 2017-06-05 DIAGNOSIS — R2681 Unsteadiness on feet: Secondary | ICD-10-CM | POA: Diagnosis not present

## 2017-06-05 DIAGNOSIS — I110 Hypertensive heart disease with heart failure: Secondary | ICD-10-CM | POA: Diagnosis not present

## 2017-06-05 DIAGNOSIS — M6281 Muscle weakness (generalized): Secondary | ICD-10-CM | POA: Diagnosis not present

## 2017-06-06 DIAGNOSIS — R2681 Unsteadiness on feet: Secondary | ICD-10-CM | POA: Diagnosis not present

## 2017-06-06 DIAGNOSIS — M6281 Muscle weakness (generalized): Secondary | ICD-10-CM | POA: Diagnosis not present

## 2017-06-06 DIAGNOSIS — G8929 Other chronic pain: Secondary | ICD-10-CM | POA: Diagnosis not present

## 2017-06-06 DIAGNOSIS — I5032 Chronic diastolic (congestive) heart failure: Secondary | ICD-10-CM | POA: Diagnosis not present

## 2017-06-06 DIAGNOSIS — I110 Hypertensive heart disease with heart failure: Secondary | ICD-10-CM | POA: Diagnosis not present

## 2017-06-06 DIAGNOSIS — R1312 Dysphagia, oropharyngeal phase: Secondary | ICD-10-CM | POA: Diagnosis not present

## 2017-06-07 DIAGNOSIS — M6281 Muscle weakness (generalized): Secondary | ICD-10-CM | POA: Diagnosis not present

## 2017-06-07 DIAGNOSIS — R1312 Dysphagia, oropharyngeal phase: Secondary | ICD-10-CM | POA: Diagnosis not present

## 2017-06-07 DIAGNOSIS — R2681 Unsteadiness on feet: Secondary | ICD-10-CM | POA: Diagnosis not present

## 2017-06-07 DIAGNOSIS — G8929 Other chronic pain: Secondary | ICD-10-CM | POA: Diagnosis not present

## 2017-06-07 DIAGNOSIS — I5032 Chronic diastolic (congestive) heart failure: Secondary | ICD-10-CM | POA: Diagnosis not present

## 2017-06-07 DIAGNOSIS — I110 Hypertensive heart disease with heart failure: Secondary | ICD-10-CM | POA: Diagnosis not present

## 2017-06-10 DIAGNOSIS — R2681 Unsteadiness on feet: Secondary | ICD-10-CM | POA: Diagnosis not present

## 2017-06-10 DIAGNOSIS — I5032 Chronic diastolic (congestive) heart failure: Secondary | ICD-10-CM | POA: Diagnosis not present

## 2017-06-10 DIAGNOSIS — M6281 Muscle weakness (generalized): Secondary | ICD-10-CM | POA: Diagnosis not present

## 2017-06-10 DIAGNOSIS — I110 Hypertensive heart disease with heart failure: Secondary | ICD-10-CM | POA: Diagnosis not present

## 2017-06-10 DIAGNOSIS — G8929 Other chronic pain: Secondary | ICD-10-CM | POA: Diagnosis not present

## 2017-06-10 DIAGNOSIS — R1312 Dysphagia, oropharyngeal phase: Secondary | ICD-10-CM | POA: Diagnosis not present

## 2017-06-11 DIAGNOSIS — F339 Major depressive disorder, recurrent, unspecified: Secondary | ICD-10-CM | POA: Diagnosis not present

## 2017-06-11 DIAGNOSIS — I119 Hypertensive heart disease without heart failure: Secondary | ICD-10-CM | POA: Diagnosis not present

## 2017-06-11 DIAGNOSIS — E669 Obesity, unspecified: Secondary | ICD-10-CM | POA: Diagnosis not present

## 2017-06-11 DIAGNOSIS — I890 Lymphedema, not elsewhere classified: Secondary | ICD-10-CM | POA: Diagnosis not present

## 2017-06-11 DIAGNOSIS — M545 Low back pain: Secondary | ICD-10-CM | POA: Diagnosis not present

## 2017-06-13 DIAGNOSIS — M6281 Muscle weakness (generalized): Secondary | ICD-10-CM | POA: Diagnosis not present

## 2017-06-13 DIAGNOSIS — G8929 Other chronic pain: Secondary | ICD-10-CM | POA: Diagnosis not present

## 2017-06-13 DIAGNOSIS — I5032 Chronic diastolic (congestive) heart failure: Secondary | ICD-10-CM | POA: Diagnosis not present

## 2017-06-13 DIAGNOSIS — R2681 Unsteadiness on feet: Secondary | ICD-10-CM | POA: Diagnosis not present

## 2017-06-13 DIAGNOSIS — R1312 Dysphagia, oropharyngeal phase: Secondary | ICD-10-CM | POA: Diagnosis not present

## 2017-06-13 DIAGNOSIS — I110 Hypertensive heart disease with heart failure: Secondary | ICD-10-CM | POA: Diagnosis not present

## 2017-06-18 DIAGNOSIS — F339 Major depressive disorder, recurrent, unspecified: Secondary | ICD-10-CM | POA: Diagnosis not present

## 2017-06-18 DIAGNOSIS — R1312 Dysphagia, oropharyngeal phase: Secondary | ICD-10-CM | POA: Diagnosis not present

## 2017-06-18 DIAGNOSIS — M6281 Muscle weakness (generalized): Secondary | ICD-10-CM | POA: Diagnosis not present

## 2017-06-18 DIAGNOSIS — R2681 Unsteadiness on feet: Secondary | ICD-10-CM | POA: Diagnosis not present

## 2017-06-18 DIAGNOSIS — G8929 Other chronic pain: Secondary | ICD-10-CM | POA: Diagnosis not present

## 2017-06-18 DIAGNOSIS — I5032 Chronic diastolic (congestive) heart failure: Secondary | ICD-10-CM | POA: Diagnosis not present

## 2017-06-18 DIAGNOSIS — I110 Hypertensive heart disease with heart failure: Secondary | ICD-10-CM | POA: Diagnosis not present

## 2017-06-19 DIAGNOSIS — M545 Low back pain: Secondary | ICD-10-CM | POA: Diagnosis not present

## 2017-06-19 DIAGNOSIS — M79671 Pain in right foot: Secondary | ICD-10-CM | POA: Diagnosis not present

## 2017-06-19 DIAGNOSIS — M25561 Pain in right knee: Secondary | ICD-10-CM | POA: Diagnosis not present

## 2017-06-19 DIAGNOSIS — G894 Chronic pain syndrome: Secondary | ICD-10-CM | POA: Diagnosis not present

## 2017-06-19 DIAGNOSIS — M25511 Pain in right shoulder: Secondary | ICD-10-CM | POA: Diagnosis not present

## 2017-06-19 DIAGNOSIS — M25512 Pain in left shoulder: Secondary | ICD-10-CM | POA: Diagnosis not present

## 2017-06-19 DIAGNOSIS — M25562 Pain in left knee: Secondary | ICD-10-CM | POA: Diagnosis not present

## 2017-06-19 DIAGNOSIS — M79672 Pain in left foot: Secondary | ICD-10-CM | POA: Diagnosis not present

## 2017-06-20 DIAGNOSIS — L603 Nail dystrophy: Secondary | ICD-10-CM | POA: Diagnosis not present

## 2017-06-20 DIAGNOSIS — F331 Major depressive disorder, recurrent, moderate: Secondary | ICD-10-CM | POA: Diagnosis not present

## 2017-06-20 DIAGNOSIS — I739 Peripheral vascular disease, unspecified: Secondary | ICD-10-CM | POA: Diagnosis not present

## 2017-06-25 DIAGNOSIS — F339 Major depressive disorder, recurrent, unspecified: Secondary | ICD-10-CM | POA: Diagnosis not present

## 2017-06-26 DIAGNOSIS — G8929 Other chronic pain: Secondary | ICD-10-CM | POA: Diagnosis not present

## 2017-06-26 DIAGNOSIS — I110 Hypertensive heart disease with heart failure: Secondary | ICD-10-CM | POA: Diagnosis not present

## 2017-06-26 DIAGNOSIS — R2681 Unsteadiness on feet: Secondary | ICD-10-CM | POA: Diagnosis not present

## 2017-06-26 DIAGNOSIS — R1312 Dysphagia, oropharyngeal phase: Secondary | ICD-10-CM | POA: Diagnosis not present

## 2017-06-26 DIAGNOSIS — M6281 Muscle weakness (generalized): Secondary | ICD-10-CM | POA: Diagnosis not present

## 2017-06-26 DIAGNOSIS — I5032 Chronic diastolic (congestive) heart failure: Secondary | ICD-10-CM | POA: Diagnosis not present

## 2017-07-02 DIAGNOSIS — G8929 Other chronic pain: Secondary | ICD-10-CM | POA: Diagnosis not present

## 2017-07-02 DIAGNOSIS — R1312 Dysphagia, oropharyngeal phase: Secondary | ICD-10-CM | POA: Diagnosis not present

## 2017-07-02 DIAGNOSIS — I110 Hypertensive heart disease with heart failure: Secondary | ICD-10-CM | POA: Diagnosis not present

## 2017-07-02 DIAGNOSIS — R2681 Unsteadiness on feet: Secondary | ICD-10-CM | POA: Diagnosis not present

## 2017-07-02 DIAGNOSIS — F339 Major depressive disorder, recurrent, unspecified: Secondary | ICD-10-CM | POA: Diagnosis not present

## 2017-07-02 DIAGNOSIS — M6281 Muscle weakness (generalized): Secondary | ICD-10-CM | POA: Diagnosis not present

## 2017-07-02 DIAGNOSIS — I5032 Chronic diastolic (congestive) heart failure: Secondary | ICD-10-CM | POA: Diagnosis not present

## 2017-07-04 DIAGNOSIS — F331 Major depressive disorder, recurrent, moderate: Secondary | ICD-10-CM | POA: Diagnosis not present

## 2017-07-08 DIAGNOSIS — E039 Hypothyroidism, unspecified: Secondary | ICD-10-CM | POA: Diagnosis not present

## 2017-07-08 DIAGNOSIS — E782 Mixed hyperlipidemia: Secondary | ICD-10-CM | POA: Diagnosis not present

## 2017-07-08 DIAGNOSIS — G629 Polyneuropathy, unspecified: Secondary | ICD-10-CM | POA: Diagnosis not present

## 2017-07-08 DIAGNOSIS — R319 Hematuria, unspecified: Secondary | ICD-10-CM | POA: Diagnosis not present

## 2017-07-08 DIAGNOSIS — F329 Major depressive disorder, single episode, unspecified: Secondary | ICD-10-CM | POA: Diagnosis not present

## 2017-07-08 DIAGNOSIS — I119 Hypertensive heart disease without heart failure: Secondary | ICD-10-CM | POA: Diagnosis not present

## 2017-07-08 DIAGNOSIS — M797 Fibromyalgia: Secondary | ICD-10-CM | POA: Diagnosis not present

## 2017-07-08 DIAGNOSIS — I482 Chronic atrial fibrillation: Secondary | ICD-10-CM | POA: Diagnosis not present

## 2017-07-08 DIAGNOSIS — Z6835 Body mass index (BMI) 35.0-35.9, adult: Secondary | ICD-10-CM | POA: Diagnosis not present

## 2017-07-08 DIAGNOSIS — C50919 Malignant neoplasm of unspecified site of unspecified female breast: Secondary | ICD-10-CM | POA: Diagnosis not present

## 2017-07-08 DIAGNOSIS — E669 Obesity, unspecified: Secondary | ICD-10-CM | POA: Diagnosis not present

## 2017-07-08 DIAGNOSIS — I890 Lymphedema, not elsewhere classified: Secondary | ICD-10-CM | POA: Diagnosis not present

## 2017-07-09 DIAGNOSIS — F331 Major depressive disorder, recurrent, moderate: Secondary | ICD-10-CM | POA: Diagnosis not present

## 2017-07-09 DIAGNOSIS — F339 Major depressive disorder, recurrent, unspecified: Secondary | ICD-10-CM | POA: Diagnosis not present

## 2017-07-16 DIAGNOSIS — F339 Major depressive disorder, recurrent, unspecified: Secondary | ICD-10-CM | POA: Diagnosis not present

## 2017-07-16 DIAGNOSIS — N812 Incomplete uterovaginal prolapse: Secondary | ICD-10-CM | POA: Diagnosis not present

## 2017-07-23 DIAGNOSIS — F339 Major depressive disorder, recurrent, unspecified: Secondary | ICD-10-CM | POA: Diagnosis not present

## 2017-07-30 DIAGNOSIS — F339 Major depressive disorder, recurrent, unspecified: Secondary | ICD-10-CM | POA: Diagnosis not present

## 2017-08-01 DIAGNOSIS — F331 Major depressive disorder, recurrent, moderate: Secondary | ICD-10-CM | POA: Diagnosis not present

## 2017-08-06 DIAGNOSIS — F339 Major depressive disorder, recurrent, unspecified: Secondary | ICD-10-CM | POA: Diagnosis not present

## 2017-08-09 DIAGNOSIS — F331 Major depressive disorder, recurrent, moderate: Secondary | ICD-10-CM | POA: Diagnosis not present

## 2017-08-09 DIAGNOSIS — F339 Major depressive disorder, recurrent, unspecified: Secondary | ICD-10-CM | POA: Diagnosis not present

## 2017-08-16 DIAGNOSIS — M545 Low back pain: Secondary | ICD-10-CM | POA: Diagnosis not present

## 2017-08-16 DIAGNOSIS — M79672 Pain in left foot: Secondary | ICD-10-CM | POA: Diagnosis not present

## 2017-08-16 DIAGNOSIS — G894 Chronic pain syndrome: Secondary | ICD-10-CM | POA: Diagnosis not present

## 2017-08-16 DIAGNOSIS — M25512 Pain in left shoulder: Secondary | ICD-10-CM | POA: Diagnosis not present

## 2017-08-16 DIAGNOSIS — M79671 Pain in right foot: Secondary | ICD-10-CM | POA: Diagnosis not present

## 2017-08-16 DIAGNOSIS — M25561 Pain in right knee: Secondary | ICD-10-CM | POA: Diagnosis not present

## 2017-08-16 DIAGNOSIS — M25511 Pain in right shoulder: Secondary | ICD-10-CM | POA: Diagnosis not present

## 2017-08-16 DIAGNOSIS — M25562 Pain in left knee: Secondary | ICD-10-CM | POA: Diagnosis not present

## 2017-08-27 DIAGNOSIS — F339 Major depressive disorder, recurrent, unspecified: Secondary | ICD-10-CM | POA: Diagnosis not present

## 2017-09-04 DIAGNOSIS — F331 Major depressive disorder, recurrent, moderate: Secondary | ICD-10-CM | POA: Diagnosis not present

## 2017-09-09 DIAGNOSIS — F339 Major depressive disorder, recurrent, unspecified: Secondary | ICD-10-CM | POA: Diagnosis not present

## 2017-09-09 DIAGNOSIS — F331 Major depressive disorder, recurrent, moderate: Secondary | ICD-10-CM | POA: Diagnosis not present

## 2017-09-23 DIAGNOSIS — L603 Nail dystrophy: Secondary | ICD-10-CM | POA: Diagnosis not present

## 2017-09-23 DIAGNOSIS — I739 Peripheral vascular disease, unspecified: Secondary | ICD-10-CM | POA: Diagnosis not present

## 2017-09-23 DIAGNOSIS — L84 Corns and callosities: Secondary | ICD-10-CM | POA: Diagnosis not present

## 2017-09-24 DIAGNOSIS — F339 Major depressive disorder, recurrent, unspecified: Secondary | ICD-10-CM | POA: Diagnosis not present

## 2017-09-27 DIAGNOSIS — F331 Major depressive disorder, recurrent, moderate: Secondary | ICD-10-CM | POA: Diagnosis not present

## 2017-10-08 DIAGNOSIS — F339 Major depressive disorder, recurrent, unspecified: Secondary | ICD-10-CM | POA: Diagnosis not present

## 2017-10-09 DIAGNOSIS — F331 Major depressive disorder, recurrent, moderate: Secondary | ICD-10-CM | POA: Diagnosis not present

## 2017-10-09 DIAGNOSIS — F339 Major depressive disorder, recurrent, unspecified: Secondary | ICD-10-CM | POA: Diagnosis not present

## 2017-10-16 DIAGNOSIS — N812 Incomplete uterovaginal prolapse: Secondary | ICD-10-CM | POA: Diagnosis not present

## 2017-10-18 DIAGNOSIS — M79671 Pain in right foot: Secondary | ICD-10-CM | POA: Diagnosis not present

## 2017-10-18 DIAGNOSIS — M25562 Pain in left knee: Secondary | ICD-10-CM | POA: Diagnosis not present

## 2017-10-18 DIAGNOSIS — G894 Chronic pain syndrome: Secondary | ICD-10-CM | POA: Diagnosis not present

## 2017-10-18 DIAGNOSIS — M25561 Pain in right knee: Secondary | ICD-10-CM | POA: Diagnosis not present

## 2017-10-18 DIAGNOSIS — M25511 Pain in right shoulder: Secondary | ICD-10-CM | POA: Diagnosis not present

## 2017-10-18 DIAGNOSIS — M79672 Pain in left foot: Secondary | ICD-10-CM | POA: Diagnosis not present

## 2017-10-18 DIAGNOSIS — M25512 Pain in left shoulder: Secondary | ICD-10-CM | POA: Diagnosis not present

## 2017-10-18 DIAGNOSIS — M545 Low back pain: Secondary | ICD-10-CM | POA: Diagnosis not present

## 2017-10-22 DIAGNOSIS — F339 Major depressive disorder, recurrent, unspecified: Secondary | ICD-10-CM | POA: Diagnosis not present

## 2017-10-25 DIAGNOSIS — F331 Major depressive disorder, recurrent, moderate: Secondary | ICD-10-CM | POA: Diagnosis not present

## 2017-11-05 DIAGNOSIS — F339 Major depressive disorder, recurrent, unspecified: Secondary | ICD-10-CM | POA: Diagnosis not present

## 2017-11-19 DIAGNOSIS — F339 Major depressive disorder, recurrent, unspecified: Secondary | ICD-10-CM | POA: Diagnosis not present

## 2017-11-21 DIAGNOSIS — L989 Disorder of the skin and subcutaneous tissue, unspecified: Secondary | ICD-10-CM | POA: Diagnosis not present

## 2017-11-21 DIAGNOSIS — M810 Age-related osteoporosis without current pathological fracture: Secondary | ICD-10-CM | POA: Diagnosis not present

## 2017-11-26 ENCOUNTER — Other Ambulatory Visit: Payer: Self-pay | Admitting: Family Medicine

## 2017-11-26 DIAGNOSIS — Z1231 Encounter for screening mammogram for malignant neoplasm of breast: Secondary | ICD-10-CM

## 2017-12-03 DIAGNOSIS — F339 Major depressive disorder, recurrent, unspecified: Secondary | ICD-10-CM | POA: Diagnosis not present

## 2017-12-17 DIAGNOSIS — F339 Major depressive disorder, recurrent, unspecified: Secondary | ICD-10-CM | POA: Diagnosis not present

## 2017-12-18 DIAGNOSIS — M545 Low back pain: Secondary | ICD-10-CM | POA: Diagnosis not present

## 2017-12-18 DIAGNOSIS — M25561 Pain in right knee: Secondary | ICD-10-CM | POA: Diagnosis not present

## 2017-12-18 DIAGNOSIS — M25562 Pain in left knee: Secondary | ICD-10-CM | POA: Diagnosis not present

## 2017-12-18 DIAGNOSIS — M25511 Pain in right shoulder: Secondary | ICD-10-CM | POA: Diagnosis not present

## 2017-12-18 DIAGNOSIS — G894 Chronic pain syndrome: Secondary | ICD-10-CM | POA: Diagnosis not present

## 2017-12-18 DIAGNOSIS — M79672 Pain in left foot: Secondary | ICD-10-CM | POA: Diagnosis not present

## 2017-12-18 DIAGNOSIS — M79671 Pain in right foot: Secondary | ICD-10-CM | POA: Diagnosis not present

## 2017-12-18 DIAGNOSIS — M25512 Pain in left shoulder: Secondary | ICD-10-CM | POA: Diagnosis not present

## 2017-12-25 ENCOUNTER — Ambulatory Visit: Payer: Medicare Other

## 2017-12-25 DIAGNOSIS — L84 Corns and callosities: Secondary | ICD-10-CM | POA: Diagnosis not present

## 2017-12-25 DIAGNOSIS — I739 Peripheral vascular disease, unspecified: Secondary | ICD-10-CM | POA: Diagnosis not present

## 2017-12-25 DIAGNOSIS — L603 Nail dystrophy: Secondary | ICD-10-CM | POA: Diagnosis not present

## 2018-01-03 ENCOUNTER — Ambulatory Visit
Admission: RE | Admit: 2018-01-03 | Discharge: 2018-01-03 | Disposition: A | Discharge status: Home / Self Care | Payer: Medicare Other | Source: Ambulatory Visit | Attending: Family Medicine | Admitting: Family Medicine

## 2018-01-03 DIAGNOSIS — Z1231 Encounter for screening mammogram for malignant neoplasm of breast: Secondary | ICD-10-CM | POA: Diagnosis not present

## 2018-01-15 DIAGNOSIS — N812 Incomplete uterovaginal prolapse: Secondary | ICD-10-CM | POA: Diagnosis not present

## 2018-01-21 DIAGNOSIS — F339 Major depressive disorder, recurrent, unspecified: Secondary | ICD-10-CM | POA: Diagnosis not present

## 2018-02-13 DIAGNOSIS — F331 Major depressive disorder, recurrent, moderate: Secondary | ICD-10-CM | POA: Diagnosis not present

## 2018-02-25 DIAGNOSIS — F339 Major depressive disorder, recurrent, unspecified: Secondary | ICD-10-CM | POA: Diagnosis not present

## 2018-03-11 DIAGNOSIS — F339 Major depressive disorder, recurrent, unspecified: Secondary | ICD-10-CM | POA: Diagnosis not present

## 2018-03-19 DIAGNOSIS — I119 Hypertensive heart disease without heart failure: Secondary | ICD-10-CM | POA: Diagnosis not present

## 2018-03-24 DIAGNOSIS — M47816 Spondylosis without myelopathy or radiculopathy, lumbar region: Secondary | ICD-10-CM | POA: Diagnosis not present

## 2018-03-24 DIAGNOSIS — M461 Sacroiliitis, not elsewhere classified: Secondary | ICD-10-CM | POA: Diagnosis not present

## 2018-03-25 DIAGNOSIS — F339 Major depressive disorder, recurrent, unspecified: Secondary | ICD-10-CM | POA: Diagnosis not present

## 2018-03-26 DIAGNOSIS — M810 Age-related osteoporosis without current pathological fracture: Secondary | ICD-10-CM | POA: Diagnosis not present

## 2018-03-26 DIAGNOSIS — M8588 Other specified disorders of bone density and structure, other site: Secondary | ICD-10-CM | POA: Diagnosis not present

## 2018-04-01 DIAGNOSIS — M79672 Pain in left foot: Secondary | ICD-10-CM | POA: Diagnosis not present

## 2018-04-01 DIAGNOSIS — M79671 Pain in right foot: Secondary | ICD-10-CM | POA: Diagnosis not present

## 2018-04-01 DIAGNOSIS — G894 Chronic pain syndrome: Secondary | ICD-10-CM | POA: Diagnosis not present

## 2018-04-01 DIAGNOSIS — M25512 Pain in left shoulder: Secondary | ICD-10-CM | POA: Diagnosis not present

## 2018-04-01 DIAGNOSIS — M25511 Pain in right shoulder: Secondary | ICD-10-CM | POA: Diagnosis not present

## 2018-04-01 DIAGNOSIS — M545 Low back pain: Secondary | ICD-10-CM | POA: Diagnosis not present

## 2018-04-01 DIAGNOSIS — M25562 Pain in left knee: Secondary | ICD-10-CM | POA: Diagnosis not present

## 2018-04-01 DIAGNOSIS — M25561 Pain in right knee: Secondary | ICD-10-CM | POA: Diagnosis not present

## 2018-04-02 DIAGNOSIS — L603 Nail dystrophy: Secondary | ICD-10-CM | POA: Diagnosis not present

## 2018-04-02 DIAGNOSIS — L84 Corns and callosities: Secondary | ICD-10-CM | POA: Diagnosis not present

## 2018-04-02 DIAGNOSIS — I739 Peripheral vascular disease, unspecified: Secondary | ICD-10-CM | POA: Diagnosis not present

## 2018-04-03 DIAGNOSIS — Z85828 Personal history of other malignant neoplasm of skin: Secondary | ICD-10-CM | POA: Diagnosis not present

## 2018-04-08 DIAGNOSIS — F339 Major depressive disorder, recurrent, unspecified: Secondary | ICD-10-CM | POA: Diagnosis not present

## 2018-05-05 DIAGNOSIS — N812 Incomplete uterovaginal prolapse: Secondary | ICD-10-CM | POA: Diagnosis not present

## 2018-05-05 DIAGNOSIS — Z4689 Encounter for fitting and adjustment of other specified devices: Secondary | ICD-10-CM | POA: Diagnosis not present

## 2018-05-06 DIAGNOSIS — F339 Major depressive disorder, recurrent, unspecified: Secondary | ICD-10-CM | POA: Diagnosis not present

## 2018-05-11 ENCOUNTER — Other Ambulatory Visit: Payer: Self-pay

## 2018-05-11 ENCOUNTER — Encounter (HOSPITAL_COMMUNITY): Payer: Self-pay

## 2018-05-11 ENCOUNTER — Emergency Department (HOSPITAL_COMMUNITY)
Admission: EM | Admit: 2018-05-11 | Discharge: 2018-05-11 | Disposition: A | Discharge status: Home / Self Care | Payer: Medicare Other | Attending: Emergency Medicine | Admitting: Emergency Medicine

## 2018-05-11 ENCOUNTER — Emergency Department (HOSPITAL_BASED_OUTPATIENT_CLINIC_OR_DEPARTMENT_OTHER)
Admit: 2018-05-11 | Discharge: 2018-05-11 | Disposition: A | Discharge status: Home / Self Care | Payer: Medicare Other | Attending: Emergency Medicine | Admitting: Emergency Medicine

## 2018-05-11 DIAGNOSIS — M79605 Pain in left leg: Secondary | ICD-10-CM | POA: Insufficient documentation

## 2018-05-11 DIAGNOSIS — E039 Hypothyroidism, unspecified: Secondary | ICD-10-CM | POA: Diagnosis not present

## 2018-05-11 DIAGNOSIS — R600 Localized edema: Secondary | ICD-10-CM | POA: Insufficient documentation

## 2018-05-11 DIAGNOSIS — M79609 Pain in unspecified limb: Secondary | ICD-10-CM | POA: Diagnosis not present

## 2018-05-11 DIAGNOSIS — L0390 Cellulitis, unspecified: Secondary | ICD-10-CM

## 2018-05-11 DIAGNOSIS — I10 Essential (primary) hypertension: Secondary | ICD-10-CM | POA: Insufficient documentation

## 2018-05-11 DIAGNOSIS — R5381 Other malaise: Secondary | ICD-10-CM | POA: Diagnosis not present

## 2018-05-11 DIAGNOSIS — M7989 Other specified soft tissue disorders: Secondary | ICD-10-CM

## 2018-05-11 DIAGNOSIS — M2550 Pain in unspecified joint: Secondary | ICD-10-CM | POA: Diagnosis not present

## 2018-05-11 DIAGNOSIS — Z79899 Other long term (current) drug therapy: Secondary | ICD-10-CM | POA: Diagnosis not present

## 2018-05-11 DIAGNOSIS — R609 Edema, unspecified: Secondary | ICD-10-CM

## 2018-05-11 DIAGNOSIS — R52 Pain, unspecified: Secondary | ICD-10-CM | POA: Diagnosis not present

## 2018-05-11 DIAGNOSIS — Z853 Personal history of malignant neoplasm of breast: Secondary | ICD-10-CM | POA: Insufficient documentation

## 2018-05-11 DIAGNOSIS — Z7401 Bed confinement status: Secondary | ICD-10-CM | POA: Diagnosis not present

## 2018-05-11 DIAGNOSIS — N3000 Acute cystitis without hematuria: Secondary | ICD-10-CM | POA: Diagnosis not present

## 2018-05-11 DIAGNOSIS — M79604 Pain in right leg: Secondary | ICD-10-CM | POA: Diagnosis present

## 2018-05-11 DIAGNOSIS — R457 State of emotional shock and stress, unspecified: Secondary | ICD-10-CM | POA: Diagnosis not present

## 2018-05-11 DIAGNOSIS — L538 Other specified erythematous conditions: Secondary | ICD-10-CM

## 2018-05-11 DIAGNOSIS — L03116 Cellulitis of left lower limb: Secondary | ICD-10-CM | POA: Diagnosis not present

## 2018-05-11 DIAGNOSIS — Z7901 Long term (current) use of anticoagulants: Secondary | ICD-10-CM | POA: Diagnosis not present

## 2018-05-11 DIAGNOSIS — L03115 Cellulitis of right lower limb: Secondary | ICD-10-CM | POA: Diagnosis not present

## 2018-05-11 LAB — URINALYSIS, ROUTINE W REFLEX MICROSCOPIC
Bilirubin Urine: NEGATIVE
GLUCOSE, UA: NEGATIVE mg/dL
Hgb urine dipstick: NEGATIVE
Ketones, ur: NEGATIVE mg/dL
NITRITE: NEGATIVE
Protein, ur: NEGATIVE mg/dL
Specific Gravity, Urine: 1.005 (ref 1.005–1.030)
pH: 6 (ref 5.0–8.0)

## 2018-05-11 LAB — CBC WITH DIFFERENTIAL/PLATELET
Abs Immature Granulocytes: 0.07 10*3/uL (ref 0.00–0.07)
BASOS PCT: 0 %
Basophils Absolute: 0 10*3/uL (ref 0.0–0.1)
EOS ABS: 0.2 10*3/uL (ref 0.0–0.5)
EOS PCT: 2 %
HEMATOCRIT: 39.7 % (ref 36.0–46.0)
Hemoglobin: 12.1 g/dL (ref 12.0–15.0)
IMMATURE GRANULOCYTES: 1 %
LYMPHS ABS: 1.3 10*3/uL (ref 0.7–4.0)
Lymphocytes Relative: 17 %
MCH: 28.7 pg (ref 26.0–34.0)
MCHC: 30.5 g/dL (ref 30.0–36.0)
MCV: 94.3 fL (ref 80.0–100.0)
MONOS PCT: 11 %
Monocytes Absolute: 0.8 10*3/uL (ref 0.1–1.0)
NEUTROS PCT: 69 %
Neutro Abs: 5.4 10*3/uL (ref 1.7–7.7)
PLATELETS: 222 10*3/uL (ref 150–400)
RBC: 4.21 MIL/uL (ref 3.87–5.11)
RDW: 14.6 % (ref 11.5–15.5)
WBC: 7.8 10*3/uL (ref 4.0–10.5)
nRBC: 0 % (ref 0.0–0.2)

## 2018-05-11 LAB — BASIC METABOLIC PANEL
Anion gap: 11 (ref 5–15)
BUN: 23 mg/dL (ref 8–23)
CO2: 24 mmol/L (ref 22–32)
Calcium: 8.8 mg/dL — ABNORMAL LOW (ref 8.9–10.3)
Chloride: 105 mmol/L (ref 98–111)
Creatinine, Ser: 0.67 mg/dL (ref 0.44–1.00)
GFR calc Af Amer: >60 mL/min (ref >60)
GFR calc non Af Amer: >60 mL/min (ref >60)
Glucose, Bld: 108 mg/dL — ABNORMAL HIGH (ref 70–99)
Potassium: 3.6 mmol/L (ref 3.5–5.1)
Sodium: 140 mmol/L (ref 135–145)

## 2018-05-11 MED ORDER — CEPHALEXIN 500 MG PO CAPS
500.0000 mg | ORAL_CAPSULE | Freq: Once | ORAL | Status: AC
  Administered 2018-05-11: 500 mg via ORAL
  Filled 2018-05-11: qty 1

## 2018-05-11 MED ORDER — CEPHALEXIN 500 MG PO CAPS: 500.0000 mg | ORAL_CAPSULE | Freq: Two times a day (BID) | ORAL | 0 refills | Status: AC

## 2018-05-11 NOTE — ED Provider Notes (Signed)
[facility] DEPT Provider Note   CSN: [id number] Arrival date & time: 05/11/18  0751     History   Chief Complaint Chief Complaint  Patient presents with  . Leg Pain    HPI [name] is a 84 y.o. female.  The history is provided by the patient.  Leg Pain   This is a chronic problem. The current episode started more than 1 week ago. The problem occurs daily. The problem has been gradually worsening. The pain is present in the right lower leg and left lower leg. The quality of the pain is described as aching. The pain is at a severity of 2/10. Pertinent negatives include no numbness and full range of motion. Associated symptoms comments: swelling. The symptoms are aggravated by contact. She has tried nothing for the symptoms. The treatment provided no relief. There has been no history of extremity trauma.    Past Medical History:  Diagnosis Date  . Anxiety   . Atrial fibrillation ([place])   . Breast CA ([place])   . Constipation   . Depression   . Disorder of bone density and structure, unspecified   . Fibromyalgia   . Hyperkalemia   . Hyperlipemia   . Hypertension   . Impaired fasting glucose   . Retention of urine, unspecified   . Scoliosis   . Thyroid disease     Patient Active Problem List   Diagnosis Date Noted  . Non-allergic rhinitis 05/01/2017  . Pressure injury of skin 04/23/2017  . Aspiration pneumonia ([place]) 04/22/2017  . Acute hypoxemic respiratory failure ([place]) 04/22/2017  . HCAP (healthcare-associated pneumonia) 03/27/2017  . Respiratory failure with hypoxia ([place]) 11/02/2015  . Depression 11/02/2015  . Anxiety 11/02/2015  . Hypertension 11/02/2015  . Paroxysmal atrial fibrillation ([place]) 11/02/2015  . Hypothyroidism 11/02/2015  . Chronic venous stasis dermatitis 11/02/2015  . Chronic pain 11/02/2015  . Breast cancer ([place]) 11/02/2015    Past Surgical History:  Procedure Laterality Date  . HIP FRACTURE SURGERY        OB History   No obstetric history on file.      Home Medications    Prior to Admission medications   Medication Sig Start Date End Date Taking? Authorizing Provider  acetaminophen (TYLENOL) 500 MG tablet Take 500 mg by mouth every 4 (four) hours.    [provider]  amiodarone (PACERONE) 100 MG tablet Take 1 tablet (100 mg total) daily by mouth. 03/29/17   Barton Dubois, MD  amLODipine (NORVASC) 10 MG tablet Take 10 mg by mouth daily.      [provider]  apixaban (ELIQUIS) 5 MG TABS tablet Take 5 mg by mouth 2 (two) times daily.    [provider]  baclofen (LIORESAL) 10 MG tablet Take 1 tablet (10 mg total) by mouth 3 (three) times daily. 04/25/17   Patrecia Pour, MD  benazepril (LOTENSIN) 20 MG tablet Take 20 mg by mouth at bedtime.    [provider]  calcium carbonate (TUMS - DOSED IN MG ELEMENTAL CALCIUM) 500 MG chewable tablet Chew 2 tablets by mouth as needed for indigestion or heartburn.    [provider]  carboxymethylcellulose (REFRESH PLUS) 0.5 % SOLN Place 1 drop into both eyes daily as needed (dry eyes).     [provider]  cephALEXin (KEFLEX) 500 MG capsule Take 1 capsule (500 mg total) by mouth 2 (two) times daily for 10 days. 05/11/18 05/21/18  Lennice Sites, DO  cholecalciferol (VITAMIN  D) 1000 units tablet Take 1,000 Units by mouth daily.    [provider]  furosemide (LASIX) 80 MG tablet Take 80 mg by mouth every morning.      [provider]  gabapentin (NEURONTIN) 100 MG capsule Take 200 mg 4 (four) times daily by mouth.    [provider]  guaiFENesin (MUCINEX) 600 MG 12 hr tablet Take 600 mg by mouth 2 (two) times daily as needed for cough.    [provider]  levothyroxine (SYNTHROID, LEVOTHROID) 50 MCG tablet Take 50 mcg by mouth daily.      [provider]  loperamide (IMODIUM) 2 MG capsule Take 2 mg by mouth 4 (four) times daily as needed. For loose  stools/diarrhea     [provider]  menthol-zinc oxide (GOLD BOND) powder Apply 1 application topically daily.    [provider]  Multiple Vitamins-Minerals (MULTIVITAMINS THER. W/MINERALS) TABS Take 1 tablet by mouth daily.      [provider]  OXYQUINOLONE SULFATE VAGINAL (TRIMO-SAN) 0.025 % GEL Place 1 application vaginally at bedtime. On Tuesdays and Fridays    [provider]  polyethylene glycol (MIRALAX / GLYCOLAX) packet Take 17 g by mouth daily as needed. For constipation.     [provider]  potassium chloride SA (K-DUR,KLOR-CON) 20 MEQ tablet Take 20 mEq by mouth daily.      [provider]  tamoxifen (NOLVADEX) 10 MG tablet Take 10 mg by mouth daily.      [provider]  tolnaftate (TINACTIN) 1 % powder Apply 1 application topically as needed for itching.    [provider]  traMADol (ULTRAM) 50 MG tablet Take 1 tablet (50 mg total) by mouth every 8 (eight) hours as needed for moderate pain. 05/02/17   Medina-Vargas, Monina C, NP  venlafaxine (EFFEXOR-XR) 150 MG 24 hr capsule Take 150 mg by mouth daily.      [provider]    Family History Family History  Problem Relation Age of Onset  . Hypertension Other     Social History Social History   Tobacco Use  . Smoking status: Never Smoker  . Smokeless tobacco: Never Used  Substance Use Topics  . Alcohol use: No  . Drug use: No     Allergies   Sulfa antibiotics   Review of Systems Review of Systems  Constitutional: Negative for chills and fever.  HENT: Negative for ear pain and sore throat.   Eyes: Negative for pain and visual disturbance.  Respiratory: Negative for cough and shortness of breath.   Cardiovascular: Positive for leg swelling. Negative for chest pain and palpitations.  Gastrointestinal: Negative for abdominal pain and vomiting.  Genitourinary: Negative for dysuria and hematuria.  Musculoskeletal: Negative for  arthralgias and back pain.  Skin: Positive for color change. Negative for rash.  Neurological: Negative for seizures, syncope and numbness.  All other systems reviewed and are negative.    Physical Exam Updated Vital Signs  ED Triage Vitals  Enc Vitals Group     BP 05/11/18 0757 140/88     Pulse Rate 05/11/18 0757 (!) 56     Resp 05/11/18 0757 18     Temp 05/11/18 0757 97.6 F (36.4 C)     Temp Source 05/11/18 0757 Oral     SpO2 05/11/18 0757 98 %     Weight --      Height --      Head Circumference --      Peak  Flow --      Pain Score 05/11/18 0813 5     Pain Loc --      Pain Edu? --      Excl. in [place]? --     Physical Exam Vitals signs and nursing note reviewed.  Constitutional:      General: She is not in acute distress.    Appearance: She is well-developed.  HENT:     Head: Normocephalic and atraumatic.     Nose: Nose normal.     Mouth/Throat:     Mouth: Mucous membranes are moist.  Eyes:     Extraocular Movements: Extraocular movements intact.     Conjunctiva/sclera: Conjunctivae normal.     Pupils: Pupils are equal, round, and reactive to light.  Neck:     Musculoskeletal: Normal range of motion and neck supple.  Cardiovascular:     Rate and Rhythm: Normal rate and regular rhythm.     Pulses: Normal pulses.     Heart sounds: Normal heart sounds. No murmur.  Pulmonary:     Effort: Pulmonary effort is normal. No respiratory distress.     Breath sounds: Normal breath sounds.  Abdominal:     General: There is no distension.     Palpations: Abdomen is soft.     Tenderness: There is no abdominal tenderness.  Musculoskeletal:     Right lower leg: Edema present.     Left lower leg: Edema present.  Skin:    General: Skin is warm and dry.     Capillary Refill: Capillary refill takes less than 2 seconds.     Comments: Patient with bilateral venous stasis changes to lower legs, right leg slightly more swollen than the left leg with slight increased redness and  warmth to the lateral shin area on the right  Neurological:     General: No focal deficit present.     Mental Status: She is alert.      ED Treatments / Results  Labs (all labs ordered are listed, but only abnormal results are displayed) Labs Reviewed  BASIC METABOLIC PANEL - Abnormal; Notable for the following components:      Result Value   Glucose, Bld 108 (*)    Calcium 8.8 (*)    All other components within normal limits  URINALYSIS, ROUTINE W REFLEX MICROSCOPIC - Abnormal; Notable for the following components:   Leukocytes, UA TRACE (*)    Bacteria, UA MANY (*)    All other components within normal limits  URINE CULTURE  CBC WITH DIFFERENTIAL/PLATELET    EKG None  Radiology Vas [place] Lower Extremity Venous (dvt) (only [facility])  Result Date: 05/11/2018  Lower Venous Study Indications: Pain, Swelling, Edema, and Erythema.  Limitations: Body habitus and severe edema and patient is very sensitive for compression. Comparison Study: lower extremity venous duplex exam on 05/31/2017 Performing Technologist: Lorina Rabon  Examination Guidelines: A complete evaluation includes B-mode imaging, spectral Doppler, color Doppler, and power Doppler as needed of all accessible portions of each vessel. Bilateral testing is considered an integral part of a complete examination. Limited examinations for reoccurring indications may be performed as noted.  Right Venous Findings: +---------+---------------+---------+-----------+----------+------------------+          CompressibilityPhasicitySpontaneityPropertiesSummary            +---------+---------------+---------+-----------+----------+------------------+ CFV      Full           Yes      Yes                                     +---------+---------------+---------+-----------+----------+------------------+  SFJ      Full                                                             +---------+---------------+---------+-----------+----------+------------------+ FV Prox  Full                                                            +---------+---------------+---------+-----------+----------+------------------+ FV Mid   Full                                                            +---------+---------------+---------+-----------+----------+------------------+ FV DistalFull                                                            +---------+---------------+---------+-----------+----------+------------------+ PFV      Full                                                            +---------+---------------+---------+-----------+----------+------------------+ POP      Full           Yes      Yes                                     +---------+---------------+---------+-----------+----------+------------------+ PTV                                                   very poor                                                                visibility         +---------+---------------+---------+-----------+----------+------------------+ PERO                                                  very poor  visibility         +---------+---------------+---------+-----------+----------+------------------+  Left Venous Findings: +---------+---------------+---------+-----------+----------+------------------+          CompressibilityPhasicitySpontaneityPropertiesSummary            +---------+---------------+---------+-----------+----------+------------------+ CFV      Full           Yes      Yes                                     +---------+---------------+---------+-----------+----------+------------------+ SFJ      Full                                                            +---------+---------------+---------+-----------+----------+------------------+ FV  Prox  Full                                                            +---------+---------------+---------+-----------+----------+------------------+ FV Mid   Full                                                            +---------+---------------+---------+-----------+----------+------------------+ FV DistalFull                                                            +---------+---------------+---------+-----------+----------+------------------+ PFV      Full                                                            +---------+---------------+---------+-----------+----------+------------------+ POP      Full           Yes      Yes                                     +---------+---------------+---------+-----------+----------+------------------+ PTV                                                   very poor  visibility         +---------+---------------+---------+-----------+----------+------------------+ PERO                                                  very poor                                                                visibility         +---------+---------------+---------+-----------+----------+------------------+    Summary: Right: There is no evidence of deep vein thrombosis in the lower extremity. However, portions of this examination were limited- see technologist comments above. No cystic structure found in the popliteal fossa. Left: There is no evidence of deep vein thrombosis in the lower extremity. However, portions of this examination were limited- see technologist comments above. No cystic structure found in the popliteal fossa.  *See table(s) above for measurements and observations.    Preliminary     Procedures Procedures (including critical care time)  Medications Ordered in ED Medications  cephALEXin (KEFLEX) capsule 500 mg (has no administration in  time range)     Initial Impression / Assessment and Plan / ED Course  I have reviewed the triage vital signs and the nursing notes.  Pertinent labs & imaging results that were available during my care of the patient were reviewed by me and considered in my medical decision making (see chart for details).     [name] is an 84 year old female history of hypertension, high cholesterol who presents to the ED with leg pain.  Patient with normal vitals.  No fever.  Patient with worsening swelling of her legs over the last several days.  Right side is worse than the left which she stated is sometimes chronic.  She has chronic venous stasis disease on exam.  There is some mild swelling bilaterally that is more increased on the right side when compared to the left.  There is some redness and warmth on the right when compared to the left.  DVT study was ordered that did not show blood clot.  Urinalysis showed possible infection however patient is asymptomatic.  Patient otherwise with no significant leukocytosis, anemia, electrolyte abnormality.  Patient had clear breath sounds on exam.  No signs of heart failure.  No history of heart failure.  Patient with normal vitals, no fever, no signs of systemic infection.  Questionable urinary tract infection, questionable cellulitis versus chronic venous stasis.  Will send urine culture and treat both possible infections with Keflex.  Recommend close follow-up with primary care provider and told to return to the ED if symptoms worsen.  This chart was dictated using voice recognition software.  Despite best efforts to proofread,  errors can occur which can change the documentation meaning.   Final Clinical Impressions(s) / ED Diagnoses   Final diagnoses:  Cellulitis, unspecified cellulitis site  Acute cystitis without hematuria    ED Discharge Orders         Ordered    cephALEXin (KEFLEX) 500 MG capsule  2 times daily     05/11/18 1048             Brendan Gadson, DO  05/11/18 1052  

## 2018-05-11 NOTE — ED Triage Notes (Signed)
She c/o bilat. Leg "aching" for several days now. Her right leg is visibly more edematous as compared to the left. She is in no distress. She denies fever, nor any other sign of current illness.

## 2018-05-11 NOTE — ED Notes (Signed)
Bed: BW38 Expected date:  Expected time:  Means of arrival:  Comments: 84 yr old bil leg pain with lump on leg

## 2018-05-11 NOTE — Discharge Instructions (Addendum)
Patient with possible urinary tract infection, urine culture was sent.  No signs of systemic infection.  Patient also with possible cellulitis of the right lower extremity versus chronic venous stasis.  Antibiotic Keflex will treat both possible urinary tract infection and cellulitis.  Patient did not have a blood clot in the right leg.  Have her follow-up with primary care doctor for recheck.  Return if symptoms worsen.

## 2018-05-11 NOTE — Progress Notes (Signed)
Bilateral lower extremities venous duplex exam completed. Please see preliminary notes on CV PROC under chart review. [address] Keilen Kahl(RDMS RVT) 05/11/18 10:21 AM

## 2018-05-13 LAB — URINE CULTURE: Culture: >=100000 — AB

## 2018-05-14 ENCOUNTER — Telehealth: Payer: Self-pay | Admitting: Emergency Medicine

## 2018-05-14 NOTE — Telephone Encounter (Signed)
Post ED Visit - Positive Culture Follow-up  Culture report reviewed by antimicrobial stewardship pharmacist:  []  Elenor Quinones, Pharm.D. []  Heide Guile, Pharm.D., BCPS AQ-ID []  Parks Neptune, Pharm.D., BCPS []  Alycia Rossetti, Pharm.D., BCPS []  [place], [place].D., BCPS, AAHIVP []  Legrand Como, Pharm.D., BCPS, AAHIVP []  Salome Arnt, PharmD, BCPS []  [name], PharmD, BCPS [x]  Hughes Better, PharmD, BCPS []  Leeroy Cha, PharmD  Positive urine culture Treated with cephalexin, organism sensitive to the same and no further patient follow-up is required at this time.  Hazle Nordmann 05/14/2018, 8:37 AM

## 2018-05-20 DIAGNOSIS — F339 Major depressive disorder, recurrent, unspecified: Secondary | ICD-10-CM | POA: Diagnosis not present

## 2018-05-28 DIAGNOSIS — I890 Lymphedema, not elsewhere classified: Secondary | ICD-10-CM | POA: Diagnosis not present

## 2018-05-28 DIAGNOSIS — I8311 Varicose veins of right lower extremity with inflammation: Secondary | ICD-10-CM | POA: Diagnosis not present

## 2018-05-28 DIAGNOSIS — L0390 Cellulitis, unspecified: Secondary | ICD-10-CM | POA: Diagnosis not present

## 2018-05-28 DIAGNOSIS — M810 Age-related osteoporosis without current pathological fracture: Secondary | ICD-10-CM | POA: Diagnosis not present

## 2018-06-03 DIAGNOSIS — F339 Major depressive disorder, recurrent, unspecified: Secondary | ICD-10-CM | POA: Diagnosis not present

## 2018-06-06 DIAGNOSIS — M810 Age-related osteoporosis without current pathological fracture: Secondary | ICD-10-CM | POA: Diagnosis not present

## 2018-06-17 DIAGNOSIS — F339 Major depressive disorder, recurrent, unspecified: Secondary | ICD-10-CM | POA: Diagnosis not present

## 2018-06-23 DIAGNOSIS — M25561 Pain in right knee: Secondary | ICD-10-CM | POA: Diagnosis not present

## 2018-06-23 DIAGNOSIS — G894 Chronic pain syndrome: Secondary | ICD-10-CM | POA: Diagnosis not present

## 2018-06-23 DIAGNOSIS — M25562 Pain in left knee: Secondary | ICD-10-CM | POA: Diagnosis not present

## 2018-06-23 DIAGNOSIS — M25511 Pain in right shoulder: Secondary | ICD-10-CM | POA: Diagnosis not present

## 2018-06-23 DIAGNOSIS — M79672 Pain in left foot: Secondary | ICD-10-CM | POA: Diagnosis not present

## 2018-06-23 DIAGNOSIS — M79671 Pain in right foot: Secondary | ICD-10-CM | POA: Diagnosis not present

## 2018-06-23 DIAGNOSIS — M25512 Pain in left shoulder: Secondary | ICD-10-CM | POA: Diagnosis not present

## 2018-06-23 DIAGNOSIS — M545 Low back pain: Secondary | ICD-10-CM | POA: Diagnosis not present

## 2018-07-01 DIAGNOSIS — R600 Localized edema: Secondary | ICD-10-CM | POA: Diagnosis not present

## 2018-07-01 DIAGNOSIS — F339 Major depressive disorder, recurrent, unspecified: Secondary | ICD-10-CM | POA: Diagnosis not present

## 2018-07-03 DIAGNOSIS — M797 Fibromyalgia: Secondary | ICD-10-CM | POA: Diagnosis not present

## 2018-07-03 DIAGNOSIS — Z79891 Long term (current) use of opiate analgesic: Secondary | ICD-10-CM | POA: Diagnosis not present

## 2018-07-03 DIAGNOSIS — Z7901 Long term (current) use of anticoagulants: Secondary | ICD-10-CM | POA: Diagnosis not present

## 2018-07-03 DIAGNOSIS — L97319 Non-pressure chronic ulcer of right ankle with unspecified severity: Secondary | ICD-10-CM | POA: Diagnosis not present

## 2018-07-03 DIAGNOSIS — I890 Lymphedema, not elsewhere classified: Secondary | ICD-10-CM | POA: Diagnosis not present

## 2018-07-03 DIAGNOSIS — G629 Polyneuropathy, unspecified: Secondary | ICD-10-CM | POA: Diagnosis not present

## 2018-07-03 DIAGNOSIS — R238 Other skin changes: Secondary | ICD-10-CM | POA: Diagnosis not present

## 2018-07-03 DIAGNOSIS — Z4800 Encounter for change or removal of nonsurgical wound dressing: Secondary | ICD-10-CM | POA: Diagnosis not present

## 2018-07-03 DIAGNOSIS — I119 Hypertensive heart disease without heart failure: Secondary | ICD-10-CM | POA: Diagnosis not present

## 2018-07-03 DIAGNOSIS — Z79899 Other long term (current) drug therapy: Secondary | ICD-10-CM | POA: Diagnosis not present

## 2018-07-03 DIAGNOSIS — C50919 Malignant neoplasm of unspecified site of unspecified female breast: Secondary | ICD-10-CM | POA: Diagnosis not present

## 2018-07-03 DIAGNOSIS — I872 Venous insufficiency (chronic) (peripheral): Secondary | ICD-10-CM | POA: Diagnosis not present

## 2018-07-03 DIAGNOSIS — I480 Paroxysmal atrial fibrillation: Secondary | ICD-10-CM | POA: Diagnosis not present

## 2018-07-03 DIAGNOSIS — F419 Anxiety disorder, unspecified: Secondary | ICD-10-CM | POA: Diagnosis not present

## 2018-07-07 DIAGNOSIS — L97319 Non-pressure chronic ulcer of right ankle with unspecified severity: Secondary | ICD-10-CM | POA: Diagnosis not present

## 2018-07-07 DIAGNOSIS — R238 Other skin changes: Secondary | ICD-10-CM | POA: Diagnosis not present

## 2018-07-07 DIAGNOSIS — Z4800 Encounter for change or removal of nonsurgical wound dressing: Secondary | ICD-10-CM | POA: Diagnosis not present

## 2018-07-07 DIAGNOSIS — I119 Hypertensive heart disease without heart failure: Secondary | ICD-10-CM | POA: Diagnosis not present

## 2018-07-07 DIAGNOSIS — I872 Venous insufficiency (chronic) (peripheral): Secondary | ICD-10-CM | POA: Diagnosis not present

## 2018-07-07 DIAGNOSIS — I890 Lymphedema, not elsewhere classified: Secondary | ICD-10-CM | POA: Diagnosis not present

## 2018-07-08 DIAGNOSIS — F339 Major depressive disorder, recurrent, unspecified: Secondary | ICD-10-CM | POA: Diagnosis not present

## 2018-07-10 ENCOUNTER — Encounter (HOSPITAL_BASED_OUTPATIENT_CLINIC_OR_DEPARTMENT_OTHER): Payer: Medicare Other | Attending: Internal Medicine

## 2018-07-10 DIAGNOSIS — I87331 Chronic venous hypertension (idiopathic) with ulcer and inflammation of right lower extremity: Secondary | ICD-10-CM | POA: Diagnosis not present

## 2018-07-10 DIAGNOSIS — I10 Essential (primary) hypertension: Secondary | ICD-10-CM | POA: Diagnosis not present

## 2018-07-10 DIAGNOSIS — Z79899 Other long term (current) drug therapy: Secondary | ICD-10-CM | POA: Diagnosis not present

## 2018-07-10 DIAGNOSIS — I482 Chronic atrial fibrillation, unspecified: Secondary | ICD-10-CM | POA: Diagnosis not present

## 2018-07-10 DIAGNOSIS — I890 Lymphedema, not elsewhere classified: Secondary | ICD-10-CM | POA: Insufficient documentation

## 2018-07-10 DIAGNOSIS — L97212 Non-pressure chronic ulcer of right calf with fat layer exposed: Secondary | ICD-10-CM | POA: Insufficient documentation

## 2018-07-14 DIAGNOSIS — R238 Other skin changes: Secondary | ICD-10-CM | POA: Diagnosis not present

## 2018-07-14 DIAGNOSIS — L97319 Non-pressure chronic ulcer of right ankle with unspecified severity: Secondary | ICD-10-CM | POA: Diagnosis not present

## 2018-07-14 DIAGNOSIS — I872 Venous insufficiency (chronic) (peripheral): Secondary | ICD-10-CM | POA: Diagnosis not present

## 2018-07-14 DIAGNOSIS — Z4800 Encounter for change or removal of nonsurgical wound dressing: Secondary | ICD-10-CM | POA: Diagnosis not present

## 2018-07-14 DIAGNOSIS — I890 Lymphedema, not elsewhere classified: Secondary | ICD-10-CM | POA: Diagnosis not present

## 2018-07-14 DIAGNOSIS — I119 Hypertensive heart disease without heart failure: Secondary | ICD-10-CM | POA: Diagnosis not present

## 2018-07-15 DIAGNOSIS — F339 Major depressive disorder, recurrent, unspecified: Secondary | ICD-10-CM | POA: Diagnosis not present

## 2018-07-17 ENCOUNTER — Encounter (HOSPITAL_BASED_OUTPATIENT_CLINIC_OR_DEPARTMENT_OTHER): Payer: Medicare Other | Attending: Internal Medicine

## 2018-07-17 DIAGNOSIS — I890 Lymphedema, not elsewhere classified: Secondary | ICD-10-CM | POA: Diagnosis not present

## 2018-07-17 DIAGNOSIS — I87331 Chronic venous hypertension (idiopathic) with ulcer and inflammation of right lower extremity: Secondary | ICD-10-CM | POA: Diagnosis not present

## 2018-07-17 DIAGNOSIS — I10 Essential (primary) hypertension: Secondary | ICD-10-CM | POA: Diagnosis not present

## 2018-07-17 DIAGNOSIS — L97812 Non-pressure chronic ulcer of other part of right lower leg with fat layer exposed: Secondary | ICD-10-CM | POA: Insufficient documentation

## 2018-07-21 DIAGNOSIS — I119 Hypertensive heart disease without heart failure: Secondary | ICD-10-CM | POA: Diagnosis not present

## 2018-07-21 DIAGNOSIS — L97319 Non-pressure chronic ulcer of right ankle with unspecified severity: Secondary | ICD-10-CM | POA: Diagnosis not present

## 2018-07-21 DIAGNOSIS — R238 Other skin changes: Secondary | ICD-10-CM | POA: Diagnosis not present

## 2018-07-21 DIAGNOSIS — I872 Venous insufficiency (chronic) (peripheral): Secondary | ICD-10-CM | POA: Diagnosis not present

## 2018-07-21 DIAGNOSIS — I890 Lymphedema, not elsewhere classified: Secondary | ICD-10-CM | POA: Diagnosis not present

## 2018-07-21 DIAGNOSIS — Z4800 Encounter for change or removal of nonsurgical wound dressing: Secondary | ICD-10-CM | POA: Diagnosis not present

## 2018-07-24 DIAGNOSIS — I87331 Chronic venous hypertension (idiopathic) with ulcer and inflammation of right lower extremity: Secondary | ICD-10-CM | POA: Diagnosis not present

## 2018-07-24 DIAGNOSIS — L97812 Non-pressure chronic ulcer of other part of right lower leg with fat layer exposed: Secondary | ICD-10-CM | POA: Diagnosis not present

## 2018-07-24 DIAGNOSIS — I890 Lymphedema, not elsewhere classified: Secondary | ICD-10-CM | POA: Diagnosis not present

## 2018-07-24 DIAGNOSIS — I10 Essential (primary) hypertension: Secondary | ICD-10-CM | POA: Diagnosis not present

## 2018-07-28 DIAGNOSIS — I872 Venous insufficiency (chronic) (peripheral): Secondary | ICD-10-CM | POA: Diagnosis not present

## 2018-07-28 DIAGNOSIS — I119 Hypertensive heart disease without heart failure: Secondary | ICD-10-CM | POA: Diagnosis not present

## 2018-07-28 DIAGNOSIS — L97319 Non-pressure chronic ulcer of right ankle with unspecified severity: Secondary | ICD-10-CM | POA: Diagnosis not present

## 2018-07-28 DIAGNOSIS — I890 Lymphedema, not elsewhere classified: Secondary | ICD-10-CM | POA: Diagnosis not present

## 2018-07-28 DIAGNOSIS — R238 Other skin changes: Secondary | ICD-10-CM | POA: Diagnosis not present

## 2018-07-28 DIAGNOSIS — Z4800 Encounter for change or removal of nonsurgical wound dressing: Secondary | ICD-10-CM | POA: Diagnosis not present

## 2018-07-30 DIAGNOSIS — L97319 Non-pressure chronic ulcer of right ankle with unspecified severity: Secondary | ICD-10-CM | POA: Diagnosis not present

## 2018-07-30 DIAGNOSIS — R238 Other skin changes: Secondary | ICD-10-CM | POA: Diagnosis not present

## 2018-07-30 DIAGNOSIS — I890 Lymphedema, not elsewhere classified: Secondary | ICD-10-CM | POA: Diagnosis not present

## 2018-07-30 DIAGNOSIS — I872 Venous insufficiency (chronic) (peripheral): Secondary | ICD-10-CM | POA: Diagnosis not present

## 2018-07-30 DIAGNOSIS — Z4800 Encounter for change or removal of nonsurgical wound dressing: Secondary | ICD-10-CM | POA: Diagnosis not present

## 2018-07-30 DIAGNOSIS — I119 Hypertensive heart disease without heart failure: Secondary | ICD-10-CM | POA: Diagnosis not present

## 2018-08-02 DIAGNOSIS — I480 Paroxysmal atrial fibrillation: Secondary | ICD-10-CM | POA: Diagnosis not present

## 2018-08-02 DIAGNOSIS — I890 Lymphedema, not elsewhere classified: Secondary | ICD-10-CM | POA: Diagnosis not present

## 2018-08-02 DIAGNOSIS — M797 Fibromyalgia: Secondary | ICD-10-CM | POA: Diagnosis not present

## 2018-08-02 DIAGNOSIS — I872 Venous insufficiency (chronic) (peripheral): Secondary | ICD-10-CM | POA: Diagnosis not present

## 2018-08-02 DIAGNOSIS — Z79899 Other long term (current) drug therapy: Secondary | ICD-10-CM | POA: Diagnosis not present

## 2018-08-02 DIAGNOSIS — R238 Other skin changes: Secondary | ICD-10-CM | POA: Diagnosis not present

## 2018-08-02 DIAGNOSIS — Z4800 Encounter for change or removal of nonsurgical wound dressing: Secondary | ICD-10-CM | POA: Diagnosis not present

## 2018-08-02 DIAGNOSIS — Z79891 Long term (current) use of opiate analgesic: Secondary | ICD-10-CM | POA: Diagnosis not present

## 2018-08-02 DIAGNOSIS — L97319 Non-pressure chronic ulcer of right ankle with unspecified severity: Secondary | ICD-10-CM | POA: Diagnosis not present

## 2018-08-02 DIAGNOSIS — G629 Polyneuropathy, unspecified: Secondary | ICD-10-CM | POA: Diagnosis not present

## 2018-08-02 DIAGNOSIS — I119 Hypertensive heart disease without heart failure: Secondary | ICD-10-CM | POA: Diagnosis not present

## 2018-08-02 DIAGNOSIS — Z7901 Long term (current) use of anticoagulants: Secondary | ICD-10-CM | POA: Diagnosis not present

## 2018-08-02 DIAGNOSIS — C50919 Malignant neoplasm of unspecified site of unspecified female breast: Secondary | ICD-10-CM | POA: Diagnosis not present

## 2018-08-02 DIAGNOSIS — F419 Anxiety disorder, unspecified: Secondary | ICD-10-CM | POA: Diagnosis not present

## 2018-08-04 DIAGNOSIS — L97319 Non-pressure chronic ulcer of right ankle with unspecified severity: Secondary | ICD-10-CM | POA: Diagnosis not present

## 2018-08-04 DIAGNOSIS — Z4800 Encounter for change or removal of nonsurgical wound dressing: Secondary | ICD-10-CM | POA: Diagnosis not present

## 2018-08-04 DIAGNOSIS — I872 Venous insufficiency (chronic) (peripheral): Secondary | ICD-10-CM | POA: Diagnosis not present

## 2018-08-04 DIAGNOSIS — I890 Lymphedema, not elsewhere classified: Secondary | ICD-10-CM | POA: Diagnosis not present

## 2018-08-04 DIAGNOSIS — R238 Other skin changes: Secondary | ICD-10-CM | POA: Diagnosis not present

## 2018-08-04 DIAGNOSIS — I119 Hypertensive heart disease without heart failure: Secondary | ICD-10-CM | POA: Diagnosis not present

## 2018-08-05 DIAGNOSIS — F339 Major depressive disorder, recurrent, unspecified: Secondary | ICD-10-CM | POA: Diagnosis not present

## 2018-08-08 DIAGNOSIS — I890 Lymphedema, not elsewhere classified: Secondary | ICD-10-CM | POA: Diagnosis not present

## 2018-08-08 DIAGNOSIS — I872 Venous insufficiency (chronic) (peripheral): Secondary | ICD-10-CM | POA: Diagnosis not present

## 2018-08-08 DIAGNOSIS — Z4800 Encounter for change or removal of nonsurgical wound dressing: Secondary | ICD-10-CM | POA: Diagnosis not present

## 2018-08-08 DIAGNOSIS — L97319 Non-pressure chronic ulcer of right ankle with unspecified severity: Secondary | ICD-10-CM | POA: Diagnosis not present

## 2018-08-08 DIAGNOSIS — R238 Other skin changes: Secondary | ICD-10-CM | POA: Diagnosis not present

## 2018-08-08 DIAGNOSIS — I119 Hypertensive heart disease without heart failure: Secondary | ICD-10-CM | POA: Diagnosis not present

## 2018-08-11 DIAGNOSIS — L97319 Non-pressure chronic ulcer of right ankle with unspecified severity: Secondary | ICD-10-CM | POA: Diagnosis not present

## 2018-08-11 DIAGNOSIS — I119 Hypertensive heart disease without heart failure: Secondary | ICD-10-CM | POA: Diagnosis not present

## 2018-08-11 DIAGNOSIS — Z4800 Encounter for change or removal of nonsurgical wound dressing: Secondary | ICD-10-CM | POA: Diagnosis not present

## 2018-08-11 DIAGNOSIS — R238 Other skin changes: Secondary | ICD-10-CM | POA: Diagnosis not present

## 2018-08-11 DIAGNOSIS — I872 Venous insufficiency (chronic) (peripheral): Secondary | ICD-10-CM | POA: Diagnosis not present

## 2018-08-11 DIAGNOSIS — I890 Lymphedema, not elsewhere classified: Secondary | ICD-10-CM | POA: Diagnosis not present

## 2018-08-12 DIAGNOSIS — F339 Major depressive disorder, recurrent, unspecified: Secondary | ICD-10-CM | POA: Diagnosis not present

## 2018-08-13 DIAGNOSIS — L97319 Non-pressure chronic ulcer of right ankle with unspecified severity: Secondary | ICD-10-CM | POA: Diagnosis not present

## 2018-08-13 DIAGNOSIS — I119 Hypertensive heart disease without heart failure: Secondary | ICD-10-CM | POA: Diagnosis not present

## 2018-08-13 DIAGNOSIS — Z4800 Encounter for change or removal of nonsurgical wound dressing: Secondary | ICD-10-CM | POA: Diagnosis not present

## 2018-08-13 DIAGNOSIS — R238 Other skin changes: Secondary | ICD-10-CM | POA: Diagnosis not present

## 2018-08-13 DIAGNOSIS — I890 Lymphedema, not elsewhere classified: Secondary | ICD-10-CM | POA: Diagnosis not present

## 2018-08-13 DIAGNOSIS — I872 Venous insufficiency (chronic) (peripheral): Secondary | ICD-10-CM | POA: Diagnosis not present

## 2018-08-20 DIAGNOSIS — Z4800 Encounter for change or removal of nonsurgical wound dressing: Secondary | ICD-10-CM | POA: Diagnosis not present

## 2018-08-20 DIAGNOSIS — I872 Venous insufficiency (chronic) (peripheral): Secondary | ICD-10-CM | POA: Diagnosis not present

## 2018-08-20 DIAGNOSIS — I890 Lymphedema, not elsewhere classified: Secondary | ICD-10-CM | POA: Diagnosis not present

## 2018-08-20 DIAGNOSIS — L97319 Non-pressure chronic ulcer of right ankle with unspecified severity: Secondary | ICD-10-CM | POA: Diagnosis not present

## 2018-08-20 DIAGNOSIS — I119 Hypertensive heart disease without heart failure: Secondary | ICD-10-CM | POA: Diagnosis not present

## 2018-08-20 DIAGNOSIS — R238 Other skin changes: Secondary | ICD-10-CM | POA: Diagnosis not present

## 2018-08-22 DIAGNOSIS — L97319 Non-pressure chronic ulcer of right ankle with unspecified severity: Secondary | ICD-10-CM | POA: Diagnosis not present

## 2018-08-22 DIAGNOSIS — I119 Hypertensive heart disease without heart failure: Secondary | ICD-10-CM | POA: Diagnosis not present

## 2018-08-22 DIAGNOSIS — R238 Other skin changes: Secondary | ICD-10-CM | POA: Diagnosis not present

## 2018-08-22 DIAGNOSIS — I890 Lymphedema, not elsewhere classified: Secondary | ICD-10-CM | POA: Diagnosis not present

## 2018-08-22 DIAGNOSIS — I872 Venous insufficiency (chronic) (peripheral): Secondary | ICD-10-CM | POA: Diagnosis not present

## 2018-08-22 DIAGNOSIS — Z4800 Encounter for change or removal of nonsurgical wound dressing: Secondary | ICD-10-CM | POA: Diagnosis not present

## 2018-08-26 DIAGNOSIS — L97319 Non-pressure chronic ulcer of right ankle with unspecified severity: Secondary | ICD-10-CM | POA: Diagnosis not present

## 2018-08-26 DIAGNOSIS — I872 Venous insufficiency (chronic) (peripheral): Secondary | ICD-10-CM | POA: Diagnosis not present

## 2018-08-26 DIAGNOSIS — R238 Other skin changes: Secondary | ICD-10-CM | POA: Diagnosis not present

## 2018-08-26 DIAGNOSIS — I890 Lymphedema, not elsewhere classified: Secondary | ICD-10-CM | POA: Diagnosis not present

## 2018-08-26 DIAGNOSIS — Z4800 Encounter for change or removal of nonsurgical wound dressing: Secondary | ICD-10-CM | POA: Diagnosis not present

## 2018-08-26 DIAGNOSIS — I119 Hypertensive heart disease without heart failure: Secondary | ICD-10-CM | POA: Diagnosis not present

## 2018-08-28 DIAGNOSIS — Z4800 Encounter for change or removal of nonsurgical wound dressing: Secondary | ICD-10-CM | POA: Diagnosis not present

## 2018-08-28 DIAGNOSIS — I890 Lymphedema, not elsewhere classified: Secondary | ICD-10-CM | POA: Diagnosis not present

## 2018-08-28 DIAGNOSIS — L97319 Non-pressure chronic ulcer of right ankle with unspecified severity: Secondary | ICD-10-CM | POA: Diagnosis not present

## 2018-08-28 DIAGNOSIS — R238 Other skin changes: Secondary | ICD-10-CM | POA: Diagnosis not present

## 2018-08-28 DIAGNOSIS — I872 Venous insufficiency (chronic) (peripheral): Secondary | ICD-10-CM | POA: Diagnosis not present

## 2018-08-28 DIAGNOSIS — I119 Hypertensive heart disease without heart failure: Secondary | ICD-10-CM | POA: Diagnosis not present

## 2018-09-01 DIAGNOSIS — I119 Hypertensive heart disease without heart failure: Secondary | ICD-10-CM | POA: Diagnosis not present

## 2018-09-01 DIAGNOSIS — C50919 Malignant neoplasm of unspecified site of unspecified female breast: Secondary | ICD-10-CM | POA: Diagnosis not present

## 2018-09-01 DIAGNOSIS — Z7901 Long term (current) use of anticoagulants: Secondary | ICD-10-CM | POA: Diagnosis not present

## 2018-09-01 DIAGNOSIS — M797 Fibromyalgia: Secondary | ICD-10-CM | POA: Diagnosis not present

## 2018-09-01 DIAGNOSIS — Z79891 Long term (current) use of opiate analgesic: Secondary | ICD-10-CM | POA: Diagnosis not present

## 2018-09-01 DIAGNOSIS — F419 Anxiety disorder, unspecified: Secondary | ICD-10-CM | POA: Diagnosis not present

## 2018-09-01 DIAGNOSIS — Z79899 Other long term (current) drug therapy: Secondary | ICD-10-CM | POA: Diagnosis not present

## 2018-09-01 DIAGNOSIS — I480 Paroxysmal atrial fibrillation: Secondary | ICD-10-CM | POA: Diagnosis not present

## 2018-09-01 DIAGNOSIS — M1990 Unspecified osteoarthritis, unspecified site: Secondary | ICD-10-CM | POA: Diagnosis not present

## 2018-09-01 DIAGNOSIS — I890 Lymphedema, not elsewhere classified: Secondary | ICD-10-CM | POA: Diagnosis not present

## 2018-09-01 DIAGNOSIS — G629 Polyneuropathy, unspecified: Secondary | ICD-10-CM | POA: Diagnosis not present

## 2018-09-02 DIAGNOSIS — M1990 Unspecified osteoarthritis, unspecified site: Secondary | ICD-10-CM | POA: Diagnosis not present

## 2018-09-02 DIAGNOSIS — I480 Paroxysmal atrial fibrillation: Secondary | ICD-10-CM | POA: Diagnosis not present

## 2018-09-02 DIAGNOSIS — I119 Hypertensive heart disease without heart failure: Secondary | ICD-10-CM | POA: Diagnosis not present

## 2018-09-02 DIAGNOSIS — I890 Lymphedema, not elsewhere classified: Secondary | ICD-10-CM | POA: Diagnosis not present

## 2018-09-02 DIAGNOSIS — M797 Fibromyalgia: Secondary | ICD-10-CM | POA: Diagnosis not present

## 2018-09-02 DIAGNOSIS — G629 Polyneuropathy, unspecified: Secondary | ICD-10-CM | POA: Diagnosis not present

## 2018-09-04 DIAGNOSIS — G629 Polyneuropathy, unspecified: Secondary | ICD-10-CM | POA: Diagnosis not present

## 2018-09-04 DIAGNOSIS — M797 Fibromyalgia: Secondary | ICD-10-CM | POA: Diagnosis not present

## 2018-09-04 DIAGNOSIS — I480 Paroxysmal atrial fibrillation: Secondary | ICD-10-CM | POA: Diagnosis not present

## 2018-09-04 DIAGNOSIS — M1990 Unspecified osteoarthritis, unspecified site: Secondary | ICD-10-CM | POA: Diagnosis not present

## 2018-09-04 DIAGNOSIS — I890 Lymphedema, not elsewhere classified: Secondary | ICD-10-CM | POA: Diagnosis not present

## 2018-09-04 DIAGNOSIS — I119 Hypertensive heart disease without heart failure: Secondary | ICD-10-CM | POA: Diagnosis not present

## 2018-09-09 DIAGNOSIS — M797 Fibromyalgia: Secondary | ICD-10-CM | POA: Diagnosis not present

## 2018-09-09 DIAGNOSIS — I480 Paroxysmal atrial fibrillation: Secondary | ICD-10-CM | POA: Diagnosis not present

## 2018-09-09 DIAGNOSIS — I890 Lymphedema, not elsewhere classified: Secondary | ICD-10-CM | POA: Diagnosis not present

## 2018-09-09 DIAGNOSIS — I119 Hypertensive heart disease without heart failure: Secondary | ICD-10-CM | POA: Diagnosis not present

## 2018-09-09 DIAGNOSIS — M1990 Unspecified osteoarthritis, unspecified site: Secondary | ICD-10-CM | POA: Diagnosis not present

## 2018-09-09 DIAGNOSIS — G629 Polyneuropathy, unspecified: Secondary | ICD-10-CM | POA: Diagnosis not present

## 2018-09-12 DIAGNOSIS — I890 Lymphedema, not elsewhere classified: Secondary | ICD-10-CM | POA: Diagnosis not present

## 2018-09-12 DIAGNOSIS — M797 Fibromyalgia: Secondary | ICD-10-CM | POA: Diagnosis not present

## 2018-09-12 DIAGNOSIS — I119 Hypertensive heart disease without heart failure: Secondary | ICD-10-CM | POA: Diagnosis not present

## 2018-09-12 DIAGNOSIS — M1990 Unspecified osteoarthritis, unspecified site: Secondary | ICD-10-CM | POA: Diagnosis not present

## 2018-09-12 DIAGNOSIS — G629 Polyneuropathy, unspecified: Secondary | ICD-10-CM | POA: Diagnosis not present

## 2018-09-12 DIAGNOSIS — I480 Paroxysmal atrial fibrillation: Secondary | ICD-10-CM | POA: Diagnosis not present

## 2018-09-15 DIAGNOSIS — M25511 Pain in right shoulder: Secondary | ICD-10-CM | POA: Diagnosis not present

## 2018-09-15 DIAGNOSIS — M79671 Pain in right foot: Secondary | ICD-10-CM | POA: Diagnosis not present

## 2018-09-15 DIAGNOSIS — M79672 Pain in left foot: Secondary | ICD-10-CM | POA: Diagnosis not present

## 2018-09-15 DIAGNOSIS — M25562 Pain in left knee: Secondary | ICD-10-CM | POA: Diagnosis not present

## 2018-09-15 DIAGNOSIS — M25512 Pain in left shoulder: Secondary | ICD-10-CM | POA: Diagnosis not present

## 2018-09-15 DIAGNOSIS — M545 Low back pain: Secondary | ICD-10-CM | POA: Diagnosis not present

## 2018-09-15 DIAGNOSIS — M25561 Pain in right knee: Secondary | ICD-10-CM | POA: Diagnosis not present

## 2018-09-15 DIAGNOSIS — G894 Chronic pain syndrome: Secondary | ICD-10-CM | POA: Diagnosis not present

## 2018-09-16 DIAGNOSIS — I119 Hypertensive heart disease without heart failure: Secondary | ICD-10-CM | POA: Diagnosis not present

## 2018-09-16 DIAGNOSIS — G629 Polyneuropathy, unspecified: Secondary | ICD-10-CM | POA: Diagnosis not present

## 2018-09-16 DIAGNOSIS — M797 Fibromyalgia: Secondary | ICD-10-CM | POA: Diagnosis not present

## 2018-09-16 DIAGNOSIS — M1990 Unspecified osteoarthritis, unspecified site: Secondary | ICD-10-CM | POA: Diagnosis not present

## 2018-09-16 DIAGNOSIS — I890 Lymphedema, not elsewhere classified: Secondary | ICD-10-CM | POA: Diagnosis not present

## 2018-09-16 DIAGNOSIS — I480 Paroxysmal atrial fibrillation: Secondary | ICD-10-CM | POA: Diagnosis not present

## 2018-09-23 DIAGNOSIS — I119 Hypertensive heart disease without heart failure: Secondary | ICD-10-CM | POA: Diagnosis not present

## 2018-09-23 DIAGNOSIS — G629 Polyneuropathy, unspecified: Secondary | ICD-10-CM | POA: Diagnosis not present

## 2018-09-23 DIAGNOSIS — I890 Lymphedema, not elsewhere classified: Secondary | ICD-10-CM | POA: Diagnosis not present

## 2018-09-23 DIAGNOSIS — I480 Paroxysmal atrial fibrillation: Secondary | ICD-10-CM | POA: Diagnosis not present

## 2018-09-23 DIAGNOSIS — M1990 Unspecified osteoarthritis, unspecified site: Secondary | ICD-10-CM | POA: Diagnosis not present

## 2018-09-23 DIAGNOSIS — M797 Fibromyalgia: Secondary | ICD-10-CM | POA: Diagnosis not present

## 2018-09-24 DIAGNOSIS — E782 Mixed hyperlipidemia: Secondary | ICD-10-CM | POA: Diagnosis not present

## 2018-09-24 DIAGNOSIS — I119 Hypertensive heart disease without heart failure: Secondary | ICD-10-CM | POA: Diagnosis not present

## 2018-09-24 DIAGNOSIS — C50919 Malignant neoplasm of unspecified site of unspecified female breast: Secondary | ICD-10-CM | POA: Diagnosis not present

## 2018-09-24 DIAGNOSIS — M797 Fibromyalgia: Secondary | ICD-10-CM | POA: Diagnosis not present

## 2018-09-24 DIAGNOSIS — I890 Lymphedema, not elsewhere classified: Secondary | ICD-10-CM | POA: Diagnosis not present

## 2018-09-24 DIAGNOSIS — I4891 Unspecified atrial fibrillation: Secondary | ICD-10-CM | POA: Diagnosis not present

## 2018-09-24 DIAGNOSIS — E039 Hypothyroidism, unspecified: Secondary | ICD-10-CM | POA: Diagnosis not present

## 2018-09-24 DIAGNOSIS — F329 Major depressive disorder, single episode, unspecified: Secondary | ICD-10-CM | POA: Diagnosis not present

## 2018-09-25 DIAGNOSIS — I480 Paroxysmal atrial fibrillation: Secondary | ICD-10-CM | POA: Diagnosis not present

## 2018-09-25 DIAGNOSIS — G629 Polyneuropathy, unspecified: Secondary | ICD-10-CM | POA: Diagnosis not present

## 2018-09-25 DIAGNOSIS — M797 Fibromyalgia: Secondary | ICD-10-CM | POA: Diagnosis not present

## 2018-09-25 DIAGNOSIS — I890 Lymphedema, not elsewhere classified: Secondary | ICD-10-CM | POA: Diagnosis not present

## 2018-09-25 DIAGNOSIS — I119 Hypertensive heart disease without heart failure: Secondary | ICD-10-CM | POA: Diagnosis not present

## 2018-09-25 DIAGNOSIS — M1990 Unspecified osteoarthritis, unspecified site: Secondary | ICD-10-CM | POA: Diagnosis not present

## 2018-09-30 DIAGNOSIS — I119 Hypertensive heart disease without heart failure: Secondary | ICD-10-CM | POA: Diagnosis not present

## 2018-09-30 DIAGNOSIS — M797 Fibromyalgia: Secondary | ICD-10-CM | POA: Diagnosis not present

## 2018-09-30 DIAGNOSIS — I890 Lymphedema, not elsewhere classified: Secondary | ICD-10-CM | POA: Diagnosis not present

## 2018-09-30 DIAGNOSIS — I480 Paroxysmal atrial fibrillation: Secondary | ICD-10-CM | POA: Diagnosis not present

## 2018-09-30 DIAGNOSIS — G629 Polyneuropathy, unspecified: Secondary | ICD-10-CM | POA: Diagnosis not present

## 2018-09-30 DIAGNOSIS — M1990 Unspecified osteoarthritis, unspecified site: Secondary | ICD-10-CM | POA: Diagnosis not present

## 2018-10-01 DIAGNOSIS — M1990 Unspecified osteoarthritis, unspecified site: Secondary | ICD-10-CM | POA: Diagnosis not present

## 2018-10-01 DIAGNOSIS — G629 Polyneuropathy, unspecified: Secondary | ICD-10-CM | POA: Diagnosis not present

## 2018-10-01 DIAGNOSIS — I890 Lymphedema, not elsewhere classified: Secondary | ICD-10-CM | POA: Diagnosis not present

## 2018-10-01 DIAGNOSIS — I119 Hypertensive heart disease without heart failure: Secondary | ICD-10-CM | POA: Diagnosis not present

## 2018-10-01 DIAGNOSIS — Z79899 Other long term (current) drug therapy: Secondary | ICD-10-CM | POA: Diagnosis not present

## 2018-10-01 DIAGNOSIS — F419 Anxiety disorder, unspecified: Secondary | ICD-10-CM | POA: Diagnosis not present

## 2018-10-01 DIAGNOSIS — I480 Paroxysmal atrial fibrillation: Secondary | ICD-10-CM | POA: Diagnosis not present

## 2018-10-01 DIAGNOSIS — Z79891 Long term (current) use of opiate analgesic: Secondary | ICD-10-CM | POA: Diagnosis not present

## 2018-10-01 DIAGNOSIS — C50919 Malignant neoplasm of unspecified site of unspecified female breast: Secondary | ICD-10-CM | POA: Diagnosis not present

## 2018-10-01 DIAGNOSIS — M797 Fibromyalgia: Secondary | ICD-10-CM | POA: Diagnosis not present

## 2018-10-01 DIAGNOSIS — Z7901 Long term (current) use of anticoagulants: Secondary | ICD-10-CM | POA: Diagnosis not present

## 2018-10-02 DIAGNOSIS — M1990 Unspecified osteoarthritis, unspecified site: Secondary | ICD-10-CM | POA: Diagnosis not present

## 2018-10-02 DIAGNOSIS — M797 Fibromyalgia: Secondary | ICD-10-CM | POA: Diagnosis not present

## 2018-10-02 DIAGNOSIS — G629 Polyneuropathy, unspecified: Secondary | ICD-10-CM | POA: Diagnosis not present

## 2018-10-02 DIAGNOSIS — I119 Hypertensive heart disease without heart failure: Secondary | ICD-10-CM | POA: Diagnosis not present

## 2018-10-02 DIAGNOSIS — I890 Lymphedema, not elsewhere classified: Secondary | ICD-10-CM | POA: Diagnosis not present

## 2018-10-02 DIAGNOSIS — I480 Paroxysmal atrial fibrillation: Secondary | ICD-10-CM | POA: Diagnosis not present

## 2018-10-07 DIAGNOSIS — F339 Major depressive disorder, recurrent, unspecified: Secondary | ICD-10-CM | POA: Diagnosis not present

## 2018-10-08 DIAGNOSIS — G629 Polyneuropathy, unspecified: Secondary | ICD-10-CM | POA: Diagnosis not present

## 2018-10-08 DIAGNOSIS — I480 Paroxysmal atrial fibrillation: Secondary | ICD-10-CM | POA: Diagnosis not present

## 2018-10-08 DIAGNOSIS — I890 Lymphedema, not elsewhere classified: Secondary | ICD-10-CM | POA: Diagnosis not present

## 2018-10-08 DIAGNOSIS — M797 Fibromyalgia: Secondary | ICD-10-CM | POA: Diagnosis not present

## 2018-10-08 DIAGNOSIS — M1990 Unspecified osteoarthritis, unspecified site: Secondary | ICD-10-CM | POA: Diagnosis not present

## 2018-10-08 DIAGNOSIS — I119 Hypertensive heart disease without heart failure: Secondary | ICD-10-CM | POA: Diagnosis not present

## 2018-10-10 DIAGNOSIS — G629 Polyneuropathy, unspecified: Secondary | ICD-10-CM | POA: Diagnosis not present

## 2018-10-10 DIAGNOSIS — I480 Paroxysmal atrial fibrillation: Secondary | ICD-10-CM | POA: Diagnosis not present

## 2018-10-10 DIAGNOSIS — M797 Fibromyalgia: Secondary | ICD-10-CM | POA: Diagnosis not present

## 2018-10-10 DIAGNOSIS — M1990 Unspecified osteoarthritis, unspecified site: Secondary | ICD-10-CM | POA: Diagnosis not present

## 2018-10-10 DIAGNOSIS — I119 Hypertensive heart disease without heart failure: Secondary | ICD-10-CM | POA: Diagnosis not present

## 2018-10-10 DIAGNOSIS — I890 Lymphedema, not elsewhere classified: Secondary | ICD-10-CM | POA: Diagnosis not present

## 2018-10-14 DIAGNOSIS — F339 Major depressive disorder, recurrent, unspecified: Secondary | ICD-10-CM | POA: Diagnosis not present

## 2018-10-21 DIAGNOSIS — F339 Major depressive disorder, recurrent, unspecified: Secondary | ICD-10-CM | POA: Diagnosis not present

## 2018-10-24 DIAGNOSIS — Z20828 Contact with and (suspected) exposure to other viral communicable diseases: Secondary | ICD-10-CM | POA: Diagnosis not present

## 2018-10-28 DIAGNOSIS — F339 Major depressive disorder, recurrent, unspecified: Secondary | ICD-10-CM | POA: Diagnosis not present

## 2018-11-04 DIAGNOSIS — F339 Major depressive disorder, recurrent, unspecified: Secondary | ICD-10-CM | POA: Diagnosis not present

## 2018-11-11 DIAGNOSIS — F339 Major depressive disorder, recurrent, unspecified: Secondary | ICD-10-CM | POA: Diagnosis not present

## 2018-11-18 DIAGNOSIS — F339 Major depressive disorder, recurrent, unspecified: Secondary | ICD-10-CM | POA: Diagnosis not present

## 2018-11-25 DIAGNOSIS — F331 Major depressive disorder, recurrent, moderate: Secondary | ICD-10-CM | POA: Diagnosis not present

## 2018-12-02 DIAGNOSIS — F331 Major depressive disorder, recurrent, moderate: Secondary | ICD-10-CM | POA: Diagnosis not present

## 2018-12-10 DIAGNOSIS — F331 Major depressive disorder, recurrent, moderate: Secondary | ICD-10-CM | POA: Diagnosis not present

## 2018-12-15 ENCOUNTER — Emergency Department (HOSPITAL_COMMUNITY)
Admission: EM | Admit: 2018-12-15 | Discharge: 2018-12-15 | Disposition: A | Discharge status: Home / Self Care | Payer: Medicare Other | Attending: Emergency Medicine | Admitting: Emergency Medicine

## 2018-12-15 ENCOUNTER — Emergency Department (HOSPITAL_COMMUNITY): Payer: Medicare Other

## 2018-12-15 ENCOUNTER — Encounter (HOSPITAL_COMMUNITY): Payer: Self-pay

## 2018-12-15 DIAGNOSIS — N3000 Acute cystitis without hematuria: Secondary | ICD-10-CM

## 2018-12-15 DIAGNOSIS — R1032 Left lower quadrant pain: Secondary | ICD-10-CM

## 2018-12-15 DIAGNOSIS — I4891 Unspecified atrial fibrillation: Secondary | ICD-10-CM | POA: Diagnosis not present

## 2018-12-15 DIAGNOSIS — Z79899 Other long term (current) drug therapy: Secondary | ICD-10-CM | POA: Insufficient documentation

## 2018-12-15 DIAGNOSIS — E039 Hypothyroidism, unspecified: Secondary | ICD-10-CM | POA: Insufficient documentation

## 2018-12-15 DIAGNOSIS — Z7901 Long term (current) use of anticoagulants: Secondary | ICD-10-CM | POA: Diagnosis not present

## 2018-12-15 DIAGNOSIS — I10 Essential (primary) hypertension: Secondary | ICD-10-CM | POA: Diagnosis not present

## 2018-12-15 LAB — COMPREHENSIVE METABOLIC PANEL
ALT: 11 U/L (ref 0–44)
AST: 10 U/L — ABNORMAL LOW (ref 15–41)
Albumin: 3.7 g/dL (ref 3.5–5.0)
Alkaline Phosphatase: 33 U/L — ABNORMAL LOW (ref 38–126)
Anion gap: 10 (ref 5–15)
BUN: 20 mg/dL (ref 8–23)
CO2: 27 mmol/L (ref 22–32)
Calcium: 8.3 mg/dL — ABNORMAL LOW (ref 8.9–10.3)
Chloride: 105 mmol/L (ref 98–111)
Creatinine, Ser: 0.63 mg/dL (ref 0.44–1.00)
GFR calc Af Amer: >60 mL/min (ref >60)
GFR calc non Af Amer: >60 mL/min (ref >60)
Glucose, Bld: 106 mg/dL — ABNORMAL HIGH (ref 70–99)
Potassium: 3.8 mmol/L (ref 3.5–5.1)
Sodium: 142 mmol/L (ref 135–145)
Total Bilirubin: 0.4 mg/dL (ref 0.3–1.2)
Total Protein: 6.3 g/dL — ABNORMAL LOW (ref 6.5–8.1)

## 2018-12-15 LAB — CBC WITH DIFFERENTIAL/PLATELET
Abs Immature Granulocytes: 0.01 10*3/uL (ref 0.00–0.07)
Basophils Absolute: 0.1 10*3/uL (ref 0.0–0.1)
Basophils Relative: 1 %
Eosinophils Absolute: 0.2 10*3/uL (ref 0.0–0.5)
Eosinophils Relative: 3 %
HCT: 38 % (ref 36.0–46.0)
Hemoglobin: 11.6 g/dL — ABNORMAL LOW (ref 12.0–15.0)
Immature Granulocytes: 0 %
Lymphocytes Relative: 21 %
Lymphs Abs: 1.4 10*3/uL (ref 0.7–4.0)
MCH: 28.2 pg (ref 26.0–34.0)
MCHC: 30.5 g/dL (ref 30.0–36.0)
MCV: 92.5 fL (ref 80.0–100.0)
Monocytes Absolute: 0.7 10*3/uL (ref 0.1–1.0)
Monocytes Relative: 11 %
Neutro Abs: 4.3 10*3/uL (ref 1.7–7.7)
Neutrophils Relative %: 64 %
Platelets: 217 10*3/uL (ref 150–400)
RBC: 4.11 MIL/uL (ref 3.87–5.11)
RDW: 14 % (ref 11.5–15.5)
WBC: 6.6 10*3/uL (ref 4.0–10.5)
nRBC: 0 % (ref 0.0–0.2)

## 2018-12-15 LAB — URINALYSIS, ROUTINE W REFLEX MICROSCOPIC
Bilirubin Urine: NEGATIVE
Glucose, UA: NEGATIVE mg/dL
Ketones, ur: NEGATIVE mg/dL
Nitrite: NEGATIVE
Protein, ur: NEGATIVE mg/dL
Specific Gravity, Urine: 1.01 (ref 1.005–1.030)
WBC, UA: >50 WBC/hpf — ABNORMAL HIGH (ref 0–5)
pH: 6 (ref 5.0–8.0)

## 2018-12-15 LAB — LIPASE, BLOOD: Lipase: 37 U/L (ref 11–51)

## 2018-12-15 MED ORDER — IOHEXOL 300 MG/ML  SOLN
100.0000 mL | Freq: Once | INTRAMUSCULAR | Status: AC | PRN
  Administered 2018-12-15: 100 mL via INTRAVENOUS

## 2018-12-15 MED ORDER — SODIUM CHLORIDE (PF) 0.9 % IJ SOLN
INTRAMUSCULAR | Status: AC
  Filled 2018-12-15: qty 50

## 2018-12-15 MED ORDER — SODIUM CHLORIDE 0.9 % IV SOLN
1.0000 g | Freq: Once | INTRAVENOUS | Status: AC
  Administered 2018-12-15: 17:00:00 1 g via INTRAVENOUS
  Filled 2018-12-15: qty 10

## 2018-12-15 MED ORDER — FENTANYL CITRATE (PF) 100 MCG/2ML IJ SOLN
50.0000 ug | Freq: Once | INTRAMUSCULAR | Status: AC
  Administered 2018-12-15: 50 ug via INTRAVENOUS
  Filled 2018-12-15: qty 2

## 2018-12-15 MED ORDER — CEPHALEXIN 500 MG PO CAPS: 1000.0000 mg | ORAL_CAPSULE | Freq: Two times a day (BID) | ORAL | 0 refills | Status: DC

## 2018-12-15 NOTE — ED Notes (Signed)
Report given to RN at [facility].

## 2018-12-15 NOTE — ED Notes (Addendum)
[facility] has been dispatched.   Patient given dinner tray.

## 2018-12-15 NOTE — Discharge Instructions (Addendum)
Return here as needed.  Follow-up with your doctor for a recheck and there is an area in your uterus that needs to be reevaluated as it is abnormal and we need to make sure that there is no cancer associated with this.  I do not feel that this is causing the pain today and will need close follow-up with ultrasound.

## 2018-12-15 NOTE — ED Notes (Signed)
Patient was picked up by [facility] while in another patients room.

## 2018-12-15 NOTE — ED Triage Notes (Signed)
Patient arrived via [facility] from assisted living ([facility] [address]).   C/O up left sided abd/pelvic pain that radiates to back X2 weeks.  Pain does not increase with acitivty. Pain is constant.   Hx. Fibromyalgia (Seen at a pain clinic)     A/Ox4 No fall or trauma.     Full code   Ambulatory with walker.  Patient able to sit and stand with EMS.

## 2018-12-15 NOTE — ED Notes (Signed)
Patient given warm blanket and pillow °

## 2018-12-15 NOTE — ED Provider Notes (Signed)
[facility] DEPT Provider Note   CSN: [id number] Arrival date & time: 12/15/18  1236     History   Chief Complaint Chief Complaint  Patient presents with  . Abdominal Pain  . Back Pain    HPI [name] is a 85 y.o. female.     HPI Patient presents to the emergency department with flank pain and left-sided abdominal pain that started 2 weeks ago.  The patient states that it seems to radiate to her back.  Patient states the pain is constant.  The patient states that nothing seems to make the condition better or worse.  Patient states she thinks it is her kidney.  The patient denies chest pain, shortness of breath, headache,blurred vision, neck pain, fever, cough, weakness, numbness, dizziness, anorexia, edema,  nausea, vomiting, diarrhea, rash, back pain, dysuria, hematemesis, bloody stool, near syncope, or syncope. Past Medical History:  Diagnosis Date  . Anxiety   . Atrial fibrillation ([place])   . Breast CA ([place])   . Constipation   . Depression   . Disorder of bone density and structure, unspecified   . Fibromyalgia   . Hyperkalemia   . Hyperlipemia   . Hypertension   . Impaired fasting glucose   . Retention of urine, unspecified   . Scoliosis   . Thyroid disease     Patient Active Problem List   Diagnosis Date Noted  . Non-allergic rhinitis 05/01/2017  . Pressure injury of skin 04/23/2017  . Aspiration pneumonia ([place]) 04/22/2017  . Acute hypoxemic respiratory failure ([place]) 04/22/2017  . HCAP (healthcare-associated pneumonia) 03/27/2017  . Respiratory failure with hypoxia ([place]) 11/02/2015  . Depression 11/02/2015  . Anxiety 11/02/2015  . Hypertension 11/02/2015  . Paroxysmal atrial fibrillation ([place]) 11/02/2015  . Hypothyroidism 11/02/2015  . Chronic venous stasis dermatitis 11/02/2015  . Chronic pain 11/02/2015  . Breast cancer ([place]) 11/02/2015    Past Surgical History:  Procedure Laterality Date  . HIP FRACTURE SURGERY        OB History   No obstetric history on file.      Home Medications    Prior to Admission medications   Medication Sig Start Date End Date Taking? Authorizing Provider  acetaminophen (TYLENOL) 500 MG tablet Take 500 mg by mouth every 4 (four) hours.    [provider]  amiodarone (PACERONE) 100 MG tablet Take 1 tablet (100 mg total) daily by mouth. 03/29/17   Barton Dubois, MD  amLODipine (NORVASC) 10 MG tablet Take 10 mg by mouth daily.      [provider]  apixaban (ELIQUIS) 5 MG TABS tablet Take 5 mg by mouth 2 (two) times daily.    [provider]  baclofen (LIORESAL) 10 MG tablet Take 1 tablet (10 mg total) by mouth 3 (three) times daily. 04/25/17   Patrecia Pour, MD  benazepril (LOTENSIN) 20 MG tablet Take 20 mg by mouth at bedtime.    [provider]  calcium carbonate (TUMS - DOSED IN MG ELEMENTAL CALCIUM) 500 MG chewable tablet Chew 2 tablets by mouth as needed for indigestion or heartburn.    [provider]  carboxymethylcellulose (REFRESH PLUS) 0.5 % SOLN Place 1 drop into both eyes daily as needed (dry eyes).     [provider]  cholecalciferol (VITAMIN D) 1000 units tablet Take 1,000 Units by mouth daily.    [provider]  furosemide (LASIX) 80 MG tablet Take 80 mg by mouth every morning.  [provider]  gabapentin (NEURONTIN) 100 MG capsule Take 200 mg 4 (four) times daily by mouth.    [provider]  guaiFENesin (MUCINEX) 600 MG 12 hr tablet Take 600 mg by mouth 2 (two) times daily as needed for cough.    [provider]  levothyroxine (SYNTHROID, LEVOTHROID) 50 MCG tablet Take 50 mcg by mouth daily.      [provider]  loperamide (IMODIUM) 2 MG capsule Take 2 mg by mouth 4 (four) times daily as needed. For loose stools/diarrhea     [provider]  menthol-zinc oxide (GOLD BOND) powder Apply 1 application topically daily.    [provider]   Multiple Vitamins-Minerals (MULTIVITAMINS THER. W/MINERALS) TABS Take 1 tablet by mouth daily.      [provider]  OXYQUINOLONE SULFATE VAGINAL (TRIMO-SAN) 0.025 % GEL Place 1 application vaginally at bedtime. On Tuesdays and Fridays    [provider]  polyethylene glycol (MIRALAX / GLYCOLAX) packet Take 17 g by mouth daily as needed. For constipation.     [provider]  potassium chloride SA (K-DUR,KLOR-CON) 20 MEQ tablet Take 20 mEq by mouth daily.      [provider]  tamoxifen (NOLVADEX) 10 MG tablet Take 10 mg by mouth daily.      [provider]  tolnaftate (TINACTIN) 1 % powder Apply 1 application topically as needed for itching.    [provider]  traMADol (ULTRAM) 50 MG tablet Take 1 tablet (50 mg total) by mouth every 8 (eight) hours as needed for moderate pain. 05/02/17   Medina-Vargas, Monina C, NP  venlafaxine (EFFEXOR-XR) 150 MG 24 hr capsule Take 150 mg by mouth daily.      [provider]    Family History Family History  Problem Relation Age of Onset  . Hypertension Other     Social History Social History   Tobacco Use  . Smoking status: Never Smoker  . Smokeless tobacco: Never Used  Substance Use Topics  . Alcohol use: No  . Drug use: No     Allergies   Sulfa antibiotics   Review of Systems Review of Systems  All other systems negative except as documented in the HPI. All pertinent positives and negatives as reviewed in the HPI. Physical Exam Updated Vital Signs BP 132/67   Pulse (!) 54   Temp (!) 97.4 F (36.3 C) (Oral)   Resp 17   SpO2 94%   Physical Exam Vitals signs and nursing note reviewed.  Constitutional:      General: She is not in acute distress.    Appearance: She is well-developed.  HENT:     Head: Normocephalic and atraumatic.  Eyes:     Pupils: Pupils are equal, round, and reactive to light.  Neck:     Musculoskeletal: Normal range of motion and neck supple.   Cardiovascular:     Rate and Rhythm: Normal rate and regular rhythm.     Heart sounds: Normal heart sounds. No murmur. No friction rub. No gallop.   Pulmonary:     Effort: Pulmonary effort is normal. No respiratory distress.     Breath sounds: Normal breath sounds. No wheezing.  Abdominal:     General: Bowel sounds are normal. There is no distension.     Palpations: Abdomen is soft.     Tenderness: There is abdominal tenderness in the left lower quadrant.  Skin:    General: Skin is warm and dry.  Capillary Refill: Capillary refill takes less than 2 seconds.     Findings: No erythema or rash.  Neurological:     Mental Status: She is alert and oriented to person, place, and time.     Motor: No abnormal muscle tone.     Coordination: Coordination normal.  Psychiatric:        Behavior: Behavior normal.      ED Treatments / Results  Labs (all labs ordered are listed, but only abnormal results are displayed) Labs Reviewed  COMPREHENSIVE METABOLIC PANEL - Abnormal; Notable for the following components:      Result Value   Glucose, Bld 106 (*)    Calcium 8.3 (*)    Total Protein 6.3 (*)    AST 10 (*)    Alkaline Phosphatase 33 (*)    All other components within normal limits  CBC WITH DIFFERENTIAL/PLATELET - Abnormal; Notable for the following components:   Hemoglobin 11.6 (*)    All other components within normal limits  URINALYSIS, ROUTINE W REFLEX MICROSCOPIC - Abnormal; Notable for the following components:   Color, Urine STRAW (*)    APPearance CLOUDY (*)    Hgb urine dipstick SMALL (*)    Leukocytes,Ua LARGE (*)    WBC, UA >50 (*)    Bacteria, UA MANY (*)    All other components within normal limits  URINE CULTURE  LIPASE, BLOOD    EKG None  Radiology Ct Abdomen Pelvis W Contrast  Result Date: 12/15/2018 CLINICAL DATA:  Left-sided abdominal pain EXAM: CT ABDOMEN AND PELVIS WITH CONTRAST TECHNIQUE: Multidetector CT imaging of the abdomen and pelvis was  performed using the standard protocol following bolus administration of intravenous contrast. CONTRAST:  178mL OMNIPAQUE IOHEXOL 300 MG/ML  SOLN COMPARISON:  None. FINDINGS: Lower chest: No acute abnormality. Bibasilar scarring or atelectasis. Hepatobiliary: No focal liver abnormality is seen. Status post cholecystectomy. Postoperative biliary dilatation. Pancreas: Unremarkable. No pancreatic ductal dilatation or surrounding inflammatory changes. Spleen: Normal in size without significant abnormality. Adrenals/Urinary Tract: Adrenal glands are unremarkable. Kidneys are normal, without renal calculi, solid lesion, or hydronephrosis. Bladder is unremarkable. Stomach/Bowel: Stomach is within normal limits. Appendix is not clearly visualized. No evidence of bowel wall thickening, distention, or inflammatory changes. Scattered sigmoid diverticula without evidence of acute diverticulitis. Vascular/Lymphatic: Scattered aortic atherosclerosis. No enlarged abdominal or pelvic lymph nodes. Reproductive: There is endometrial thickening and/or fluid, measuring at least 1.9 cm in thickness (series 6, image 78). Pessary in the vagina. Other: No abdominal wall hernia or abnormality. No abdominopelvic ascites. Musculoskeletal: Severe levoscoliosis of the thoracolumbar spine with associated disc degenerative disease. Status post left hip total arthroplasty with dense associated metallic streak artifact. IMPRESSION: 1. No acute CT findings of the abdomen or pelvis to explain left-sided pain. 2. There is endometrial thickening and/or fluid, measuring at least 1.9 cm in thickness, and concerning for malignancy in the postmenopausal setting (series 6, image 78). Recommend dedicated pelvic ultrasound to further evaluate. 3. Chronic, incidental, and postoperative findings as detailed above. Electronically Signed   By: Eddie Candle M.D.   On: 12/15/2018 15:58    Procedures Procedures (including critical care time)  Medications  Ordered in ED Medications  sodium chloride (PF) 0.9 % injection (has no administration in time range)  cefTRIAXone (ROCEPHIN) 1 g in sodium chloride 0.9 % 100 mL IVPB (has no administration in time range)  fentaNYL (SUBLIMAZE) injection 50 mcg (50 mcg Intravenous Given 12/15/18 1402)  iohexol (OMNIPAQUE) 300 MG/ML solution 100 mL (100 mLs Intravenous  Contrast Given 12/15/18 1539)     Initial Impression / Assessment and Plan / ED Course  I have reviewed the triage vital signs and the nursing notes.  Pertinent labs & imaging results that were available during my care of the patient were reviewed by me and considered in my medical decision making (see chart for details).        Patient most likely has urinary tract infection is causing her pain.  The CT scan shows some endometrial thickening which cannot exclude malignancy.  I feel that the patient will need to follow-up for this.  The facility was made aware of this finding and the need for follow-up.  The patient is stable here in the emergency department and I feel that she will need to be treated for urinary tract infection.   Final Clinical Impressions(s) / ED Diagnoses   Final diagnoses:  None    ED Discharge Orders    None       Dalia Heading, PA-C 12/15/18 1653    Lucrezia Starch, MD 12/15/18 [phone]

## 2018-12-16 LAB — URINE CULTURE

## 2018-12-17 DIAGNOSIS — N390 Urinary tract infection, site not specified: Secondary | ICD-10-CM | POA: Diagnosis not present

## 2018-12-17 DIAGNOSIS — Z7189 Other specified counseling: Secondary | ICD-10-CM | POA: Diagnosis not present

## 2018-12-17 DIAGNOSIS — I890 Lymphedema, not elsewhere classified: Secondary | ICD-10-CM | POA: Diagnosis not present

## 2019-01-06 ENCOUNTER — Emergency Department (HOSPITAL_COMMUNITY): Payer: Medicare Other

## 2019-01-06 ENCOUNTER — Emergency Department (HOSPITAL_COMMUNITY)
Admission: EM | Admit: 2019-01-06 | Discharge: 2019-01-06 | Disposition: A | Discharge status: Home / Self Care | Payer: Medicare Other | Attending: Emergency Medicine | Admitting: Emergency Medicine

## 2019-01-06 ENCOUNTER — Other Ambulatory Visit: Payer: Self-pay

## 2019-01-06 ENCOUNTER — Encounter (HOSPITAL_COMMUNITY): Payer: Self-pay | Admitting: Emergency Medicine

## 2019-01-06 DIAGNOSIS — E039 Hypothyroidism, unspecified: Secondary | ICD-10-CM | POA: Diagnosis not present

## 2019-01-06 DIAGNOSIS — M5442 Lumbago with sciatica, left side: Secondary | ICD-10-CM | POA: Insufficient documentation

## 2019-01-06 DIAGNOSIS — I10 Essential (primary) hypertension: Secondary | ICD-10-CM | POA: Insufficient documentation

## 2019-01-06 DIAGNOSIS — G8929 Other chronic pain: Secondary | ICD-10-CM | POA: Diagnosis not present

## 2019-01-06 DIAGNOSIS — R200 Anesthesia of skin: Secondary | ICD-10-CM

## 2019-01-06 DIAGNOSIS — Z853 Personal history of malignant neoplasm of breast: Secondary | ICD-10-CM | POA: Diagnosis not present

## 2019-01-06 DIAGNOSIS — Z79899 Other long term (current) drug therapy: Secondary | ICD-10-CM | POA: Diagnosis not present

## 2019-01-06 DIAGNOSIS — Z7901 Long term (current) use of anticoagulants: Secondary | ICD-10-CM | POA: Insufficient documentation

## 2019-01-06 LAB — CBC WITH DIFFERENTIAL/PLATELET
Abs Immature Granulocytes: 0.03 10*3/uL (ref 0.00–0.07)
Basophils Absolute: 0.1 10*3/uL (ref 0.0–0.1)
Basophils Relative: 1 %
Eosinophils Absolute: 0.1 10*3/uL (ref 0.0–0.5)
Eosinophils Relative: 2 %
HCT: 39.1 % (ref 36.0–46.0)
Hemoglobin: 12.2 g/dL (ref 12.0–15.0)
Immature Granulocytes: 0 %
Lymphocytes Relative: 23 %
Lymphs Abs: 1.6 10*3/uL (ref 0.7–4.0)
MCH: 28.6 pg (ref 26.0–34.0)
MCHC: 31.2 g/dL (ref 30.0–36.0)
MCV: 91.6 fL (ref 80.0–100.0)
Monocytes Absolute: 0.6 10*3/uL (ref 0.1–1.0)
Monocytes Relative: 8 %
Neutro Abs: 4.6 10*3/uL (ref 1.7–7.7)
Neutrophils Relative %: 66 %
Platelets: 213 10*3/uL (ref 150–400)
RBC: 4.27 MIL/uL (ref 3.87–5.11)
RDW: 13.9 % (ref 11.5–15.5)
WBC: 6.9 10*3/uL (ref 4.0–10.5)
nRBC: 0 % (ref 0.0–0.2)

## 2019-01-06 LAB — URINALYSIS, ROUTINE W REFLEX MICROSCOPIC
Bilirubin Urine: NEGATIVE
Glucose, UA: NEGATIVE mg/dL
Hgb urine dipstick: NEGATIVE
Ketones, ur: 5 mg/dL — AB
Nitrite: NEGATIVE
Protein, ur: NEGATIVE mg/dL
Specific Gravity, Urine: 1.01 (ref 1.005–1.030)
pH: 6 (ref 5.0–8.0)

## 2019-01-06 LAB — BASIC METABOLIC PANEL
Anion gap: 9 (ref 5–15)
BUN: 16 mg/dL (ref 8–23)
CO2: 24 mmol/L (ref 22–32)
Calcium: 8.6 mg/dL — ABNORMAL LOW (ref 8.9–10.3)
Chloride: 106 mmol/L (ref 98–111)
Creatinine, Ser: 0.66 mg/dL (ref 0.44–1.00)
GFR calc Af Amer: >60 mL/min (ref >60)
GFR calc non Af Amer: >60 mL/min (ref >60)
Glucose, Bld: 101 mg/dL — ABNORMAL HIGH (ref 70–99)
Potassium: 3.4 mmol/L — ABNORMAL LOW (ref 3.5–5.1)
Sodium: 139 mmol/L (ref 135–145)

## 2019-01-06 MED ORDER — HYDROCODONE-ACETAMINOPHEN 5-325 MG PO TABS: 1.0000 | ORAL_TABLET | Freq: Three times a day (TID) | ORAL | 0 refills | Status: DC | PRN

## 2019-01-06 NOTE — ED Triage Notes (Addendum)
Pt arrives to ED from via EMS from [facility] [address] with complaints of left sided numbness (face, arm, leg) starting at 4am this morning. LKN was 12pm last night when she went to bed. Patient also complains of left sided groin pain for weeks.

## 2019-01-06 NOTE — ED Notes (Signed)
PTAR called @ [address], RN called by Levada Dy

## 2019-01-06 NOTE — ED Notes (Addendum)
Patient verbalizes understanding of discharge instructions. Opportunity for questioning and answers were provided. Armband removed by staff, pt discharged from ED with PTAR for transport back to [facility].

## 2019-01-12 NOTE — ED Provider Notes (Signed)
[facility] EMERGENCY DEPARTMENT Provider Note   CSN: [id number] Arrival date & time: 01/06/19  [id number]     History   Chief Complaint Chief Complaint  Patient presents with  . Numbness    HPI CHARNELL BRIGHAM is a 85 y.o. female.     HPI   85 year old female with left-sided numbness.  Patient giving varying accounts as to when these symptoms started.  Says she feels numb in her left face arm and leg.  Still complaining of pain in her left groin/left lower extremity which is been going on for several weeks.  Denies any acute change in vision or speech.  No headaches.  Past Medical History:  Diagnosis Date  . Anxiety   . Atrial fibrillation ([place])   . Breast CA ([place])   . Constipation   . Depression   . Disorder of bone density and structure, unspecified   . Fibromyalgia   . Hyperkalemia   . Hyperlipemia   . Hypertension   . Impaired fasting glucose   . Retention of urine, unspecified   . Scoliosis   . Thyroid disease     Patient Active Problem List   Diagnosis Date Noted  . Non-allergic rhinitis 05/01/2017  . Pressure injury of skin 04/23/2017  . Aspiration pneumonia ([place]) 04/22/2017  . Acute hypoxemic respiratory failure ([place]) 04/22/2017  . HCAP (healthcare-associated pneumonia) 03/27/2017  . Respiratory failure with hypoxia ([place]) 11/02/2015  . Depression 11/02/2015  . Anxiety 11/02/2015  . Hypertension 11/02/2015  . Paroxysmal atrial fibrillation ([place]) 11/02/2015  . Hypothyroidism 11/02/2015  . Chronic venous stasis dermatitis 11/02/2015  . Chronic pain 11/02/2015  . Breast cancer ([place]) 11/02/2015    Past Surgical History:  Procedure Laterality Date  . HIP FRACTURE SURGERY       OB History   No obstetric history on file.      Home Medications    Prior to Admission medications   Medication Sig Start Date End Date Taking? Authorizing Provider  acetaminophen (TYLENOL) 500 MG tablet Take 500 mg by mouth every 4 (four) hours.   Yes  [provider]  amiodarone (PACERONE) 100 MG tablet Take 1 tablet (100 mg total) daily by mouth. 03/29/17  Yes Barton Dubois, MD  amLODipine (NORVASC) 5 MG tablet Take 5 mg by mouth daily.   Yes [provider]  apixaban (ELIQUIS) 5 MG TABS tablet Take 5 mg by mouth 2 (two) times daily.   Yes [provider]  baclofen (LIORESAL) 10 MG tablet Take 1 tablet (10 mg total) by mouth 3 (three) times daily. 04/25/17  Yes Patrecia Pour, MD  benazepril (LOTENSIN) 40 MG tablet Take 40 mg by mouth daily.   Yes [provider]  calcium carbonate (TUMS - DOSED IN MG ELEMENTAL CALCIUM) 500 MG chewable tablet Chew 2 tablets by mouth as needed for indigestion or heartburn.   Yes [provider]  carboxymethylcellulose (REFRESH PLUS) 0.5 % SOLN Place 1 drop into both eyes daily as needed (dry eyes).    Yes [provider]  furosemide (LASIX) 80 MG tablet Take 80 mg by mouth every morning.     Yes [provider]  gabapentin (NEURONTIN) 100 MG capsule Take 200 mg 4 (four) times daily by mouth.   Yes [provider]  levothyroxine (SYNTHROID, LEVOTHROID) 50 MCG tablet Take 50 mcg by mouth daily.     Yes [provider]  polyethylene glycol (MIRALAX / GLYCOLAX) packet Take 17 g by mouth daily  as needed. For constipation.    Yes [provider]  potassium chloride SA (K-DUR,KLOR-CON) 20 MEQ tablet Take 20 mEq by mouth daily.     Yes [provider]  tamoxifen (NOLVADEX) 10 MG tablet Take 10 mg by mouth daily.     Yes [provider]  traMADol (ULTRAM) 50 MG tablet Take 1 tablet (50 mg total) by mouth every 8 (eight) hours as needed for moderate pain. 05/02/17  Yes Medina-Vargas, Monina C, NP  benazepril (LOTENSIN) 20 MG tablet Take 20 mg by mouth at bedtime.    [provider]  cephALEXin (KEFLEX) 500 MG capsule Take 2 capsules (1,000 mg total) by mouth 2 (two) times daily. Patient not taking: Reported  on 01/06/2019 12/15/18   Dalia Heading, PA-C  HYDROcodone-acetaminophen (NORCO/VICODIN) 5-325 MG tablet Take 1 tablet by mouth every 8 (eight) hours as needed. 01/06/19   Virgel Manifold, MD  loperamide (IMODIUM) 2 MG capsule Take 2 mg by mouth 4 (four) times daily as needed. For loose stools/diarrhea     [provider]  menthol-zinc oxide (GOLD BOND) powder Apply 1 application topically daily.    [provider]  OXYQUINOLONE SULFATE VAGINAL (TRIMO-SAN) 0.025 % GEL Place 1 application vaginally at bedtime. On Tuesdays and Fridays    [provider]  tolnaftate (TINACTIN) 1 % powder Apply 1 application topically as needed for itching.    [provider]    Family History Family History  Problem Relation Age of Onset  . Hypertension Other     Social History Social History   Tobacco Use  . Smoking status: Never Smoker  . Smokeless tobacco: Never Used  Substance Use Topics  . Alcohol use: No  . Drug use: No     Allergies   Sulfa antibiotics   Review of Systems Review of Systems  All systems reviewed and negative, other than as noted in HPI.  Physical Exam Updated Vital Signs BP (!) 152/73 (BP Location: Right Arm)   Pulse (!) 54   Temp 98.8 F (37.1 C) (Oral)   Resp 13   SpO2 98%   Physical Exam Vitals signs and nursing note reviewed.  Constitutional:      General: She is not in acute distress.    Appearance: She is well-developed.  HENT:     Head: Normocephalic and atraumatic.  Eyes:     General:        Right eye: No discharge.        Left eye: No discharge.     Conjunctiva/sclera: Conjunctivae normal.  Neck:     Musculoskeletal: Neck supple.  Cardiovascular:     Rate and Rhythm: Normal rate and regular rhythm.     Heart sounds: Normal heart sounds. No murmur. No friction rub. No gallop.   Pulmonary:     Effort: Pulmonary effort is normal. No respiratory distress.     Breath sounds: Normal breath sounds.  Abdominal:      General: There is no distension.     Palpations: Abdomen is soft.     Tenderness: There is no abdominal tenderness.  Musculoskeletal:        General: No tenderness.  Skin:    General: Skin is warm and dry.  Neurological:     General: No focal deficit present.     Mental Status: She is alert.     Cranial Nerves: No cranial nerve deficit.     Sensory: No sensory deficit.     Motor: No weakness.  Psychiatric:        Thought Content: Thought content normal.     Comments: Anxious      ED Treatments / Results  Labs (all labs ordered are listed, but only abnormal results are displayed) Labs Reviewed  BASIC METABOLIC PANEL - Abnormal; Notable for the following components:      Result Value   Potassium 3.4 (*)    Glucose, Bld 101 (*)    Calcium 8.6 (*)    All other components within normal limits  URINALYSIS, ROUTINE W REFLEX MICROSCOPIC - Abnormal; Notable for the following components:   APPearance CLOUDY (*)    Ketones, ur 5 (*)    Leukocytes,Ua SMALL (*)    Bacteria, UA MANY (*)    All other components within normal limits  CBC WITH DIFFERENTIAL/PLATELET    EKG EKG Interpretation  Date/Time:  Tuesday January 06 2019 10:25:07 EDT Ventricular Rate:  54 PR Interval:    QRS Duration: 97 QT Interval:  477 QTC Calculation: 453 R Axis:   93 Text Interpretation:  Atrial fibrillation Lateral infarct, old Confirmed by Gerlene Fee [phone]) on 01/07/2019 1:45:20 PM   Radiology No results found.   Mr Brain Wo Contrast  Result Date: 01/06/2019 CLINICAL DATA:  Focal neuro deficit with stroke suspected.Left-sided numbness starting this morning. EXAM: MRI HEAD WITHOUT CONTRAST TECHNIQUE: Multiplanar, multiecho pulse sequences of the brain and surrounding structures were obtained without intravenous contrast. COMPARISON:  11/02/2015 FINDINGS: Brain: No acute infarction, hemorrhage, hydrocephalus, extra-axial collection or mass lesion. Moderate FLAIR hyperintensity in the cerebral  white matter attributed to chronic small vessel ischemia, present in the pons to a milder degree. Mild for age cerebral volume loss. Vascular: Major flow voids are preserved Skull and upper cervical spine: Negative for marrow lesion. Degenerative facet spurring. Sinuses/Orbits: Bilateral cataract resection IMPRESSION: 1. No acute or reversible finding. 2. Moderate chronic small vessel ischemia in the cerebral white matter. Electronically Signed   By: Monte Fantasia M.D.   On: 01/06/2019 10:46   Ct Abdomen Pelvis W Contrast  Result Date: 12/15/2018 CLINICAL DATA:  Left-sided abdominal pain EXAM: CT ABDOMEN AND PELVIS WITH CONTRAST TECHNIQUE: Multidetector CT imaging of the abdomen and pelvis was performed using the standard protocol following bolus administration of intravenous contrast. CONTRAST:  149mL OMNIPAQUE IOHEXOL 300 MG/ML  SOLN COMPARISON:  None. FINDINGS: Lower chest: No acute abnormality. Bibasilar scarring or atelectasis. Hepatobiliary: No focal liver abnormality is seen. Status post cholecystectomy. Postoperative biliary dilatation. Pancreas: Unremarkable. No pancreatic ductal dilatation or surrounding inflammatory changes. Spleen: Normal in size without significant abnormality. Adrenals/Urinary Tract: Adrenal glands are unremarkable. Kidneys are normal, without renal calculi, solid lesion, or hydronephrosis. Bladder is unremarkable. Stomach/Bowel: Stomach is within normal limits. Appendix is not clearly visualized. No evidence of bowel wall thickening, distention, or inflammatory changes. Scattered sigmoid diverticula without evidence of acute diverticulitis. Vascular/Lymphatic: Scattered aortic atherosclerosis. No enlarged abdominal or pelvic lymph nodes. Reproductive: There is endometrial thickening and/or fluid, measuring at least 1.9 cm in thickness (series 6, image 78). Pessary in the vagina. Other: No abdominal wall hernia or abnormality. No abdominopelvic ascites. Musculoskeletal: Severe  levoscoliosis of the thoracolumbar spine with associated disc degenerative disease. Status post left hip total arthroplasty with dense associated metallic streak artifact. IMPRESSION: 1. No acute CT findings of the abdomen or pelvis to explain left-sided pain. 2. There is endometrial thickening and/or fluid, measuring at least 1.9 cm in thickness, and concerning for malignancy in the postmenopausal setting (series 6, image 78). Recommend dedicated pelvic  ultrasound to further evaluate. 3. Chronic, incidental, and postoperative findings as detailed above. Electronically Signed   By: Eddie Candle M.D.   On: 12/15/2018 15:58    Procedures Procedures (including critical care time)  Medications Ordered in ED Medications - No data to display   Initial Impression / Assessment and Plan / ED Course  I have reviewed the triage vital signs and the nursing notes.  Pertinent labs & imaging results that were available during my care of the patient were reviewed by me and considered in my medical decision making (see chart for details).       85 year old female with left-sided numbness.  Unclear as to exact chronicity but seems to be acute to subacute?  Regardless, MRI does not show any acute abnormality.  Her left pelvic/hip/left lower extremity pain seems to be more subacute to chronic in nature.  A CT from the beginning of the month was reviewed.  Unclear as she has yet to follow-up for further evaluation of of endometrial thickening in a postmenopausal patient.  This needs to be done so if it has not been already.  I doubt emergent process otherwise.  Outpatient follow-up.     Final Clinical Impressions(s) / ED Diagnoses   Final diagnoses:  Left facial numbness  Chronic left-sided low back pain with left-sided sciatica    ED Discharge Orders         Ordered    HYDROcodone-acetaminophen (NORCO/VICODIN) 5-325 MG tablet  Every 8 hours PRN     01/06/19 1131           Virgel Manifold, MD  01/12/19 1115

## 2019-03-16 DIAGNOSIS — M25562 Pain in left knee: Secondary | ICD-10-CM | POA: Diagnosis not present

## 2019-03-16 DIAGNOSIS — M25561 Pain in right knee: Secondary | ICD-10-CM | POA: Diagnosis not present

## 2019-03-16 DIAGNOSIS — G894 Chronic pain syndrome: Secondary | ICD-10-CM | POA: Diagnosis not present

## 2019-03-16 DIAGNOSIS — M25512 Pain in left shoulder: Secondary | ICD-10-CM | POA: Diagnosis not present

## 2019-03-16 DIAGNOSIS — M25511 Pain in right shoulder: Secondary | ICD-10-CM | POA: Diagnosis not present

## 2019-03-16 DIAGNOSIS — M79671 Pain in right foot: Secondary | ICD-10-CM | POA: Diagnosis not present

## 2019-03-16 DIAGNOSIS — M79672 Pain in left foot: Secondary | ICD-10-CM | POA: Diagnosis not present

## 2019-03-16 DIAGNOSIS — M545 Low back pain: Secondary | ICD-10-CM | POA: Diagnosis not present

## 2019-04-01 ENCOUNTER — Other Ambulatory Visit: Payer: Self-pay | Admitting: Family Medicine

## 2019-04-01 DIAGNOSIS — Z1231 Encounter for screening mammogram for malignant neoplasm of breast: Secondary | ICD-10-CM

## 2019-05-16 DIAGNOSIS — Z7189 Other specified counseling: Secondary | ICD-10-CM | POA: Insufficient documentation

## 2019-05-16 NOTE — Progress Notes (Deleted)
Cardiology Office Note   Date:  05/16/2019   ID:  [name], DOB [birth year], MRN [id number]  PCP:  [name], MD  Cardiologist:   No primary care provider on file. Referring:  ***  No chief complaint on file.     History of Present Illness: [name] is a 86 y.o. female who is referred by *** for evaluation of atrial fib ***   .     Past Medical History:  Diagnosis Date  . Anxiety   . Atrial fibrillation ([place])   . Breast CA ([place])   . Constipation   . Depression   . Disorder of bone density and structure, unspecified   . Fibromyalgia   . Hyperkalemia   . Hyperlipemia   . Hypertension   . Impaired fasting glucose   . Retention of urine, unspecified   . Scoliosis   . Thyroid disease     Past Surgical History:  Procedure Laterality Date  . HIP FRACTURE SURGERY       Current Outpatient Medications  Medication Sig Dispense Refill  . acetaminophen (TYLENOL) 500 MG tablet Take 500 mg by mouth every 4 (four) hours.    Marland Kitchen amiodarone (PACERONE) 100 MG tablet Take 1 tablet (100 mg total) daily by mouth. 30 tablet 1  . amLODipine (NORVASC) 5 MG tablet Take 5 mg by mouth daily.    Marland Kitchen apixaban (ELIQUIS) 5 MG TABS tablet Take 5 mg by mouth 2 (two) times daily.    . baclofen (LIORESAL) 10 MG tablet Take 1 tablet (10 mg total) by mouth 3 (three) times daily. 9 each 0  . benazepril (LOTENSIN) 20 MG tablet Take 20 mg by mouth at bedtime.    . benazepril (LOTENSIN) 40 MG tablet Take 40 mg by mouth daily.    . calcium carbonate (TUMS - DOSED IN MG ELEMENTAL CALCIUM) 500 MG chewable tablet Chew 2 tablets by mouth as needed for indigestion or heartburn.    . carboxymethylcellulose (REFRESH PLUS) 0.5 % SOLN Place 1 drop into both eyes daily as needed (dry eyes).     . cephALEXin (KEFLEX) 500 MG capsule Take 2 capsules (1,000 mg total) by mouth 2 (two) times daily. (Patient not taking: Reported on 01/06/2019) 28 capsule 0  . furosemide (LASIX) 80 MG tablet Take 80 mg  by mouth every morning.      . gabapentin (NEURONTIN) 100 MG capsule Take 200 mg 4 (four) times daily by mouth.    Marland Kitchen HYDROcodone-acetaminophen (NORCO/VICODIN) 5-325 MG tablet Take 1 tablet by mouth every 8 (eight) hours as needed. 10 tablet 0  . levothyroxine (SYNTHROID, LEVOTHROID) 50 MCG tablet Take 50 mcg by mouth daily.      Marland Kitchen loperamide (IMODIUM) 2 MG capsule Take 2 mg by mouth 4 (four) times daily as needed. For loose stools/diarrhea     . menthol-zinc oxide (GOLD BOND) powder Apply 1 application topically daily.    Levin Erp SULFATE VAGINAL (TRIMO-SAN) 0.025 % GEL Place 1 application vaginally at bedtime. On Tuesdays and Fridays    . polyethylene glycol (MIRALAX / GLYCOLAX) packet Take 17 g by mouth daily as needed. For constipation.     . potassium chloride SA (K-DUR,KLOR-CON) 20 MEQ tablet Take 20 mEq by mouth daily.      . tamoxifen (NOLVADEX) 10 MG tablet Take 10 mg by mouth daily.      Marland Kitchen tolnaftate (TINACTIN) 1 % powder Apply 1 application topically as needed for itching.    Marland Kitchen  traMADol (ULTRAM) 50 MG tablet Take 1 tablet (50 mg total) by mouth every 8 (eight) hours as needed for moderate pain. 30 tablet 0   No current facility-administered medications for this visit.    Allergies:   Sulfa antibiotics    Social History:  The patient  reports that she has never smoked. She has never used smokeless tobacco. She reports that she does not drink alcohol or use drugs.   Family History:  The patient's ***family history includes Hypertension in an other family member.    ROS:  Please see the history of present illness.   Otherwise, review of systems are positive for {NONE DEFAULTED:18576::"none"}.   All other systems are reviewed and negative.    PHYSICAL EXAM: VS:  There were no vitals taken for this visit. , BMI There is no height or weight on file to calculate BMI. GENERAL:  Well appearing HEENT:  Pupils equal round and reactive, fundi not visualized, oral mucosa  unremarkable NECK:  No jugular venous distention, waveform within normal limits, carotid upstroke brisk and symmetric, no bruits, no thyromegaly LYMPHATICS:  No cervical, inguinal adenopathy LUNGS:  Clear to auscultation bilaterally BACK:  No CVA tenderness CHEST:  Unremarkable HEART:  PMI not displaced or sustained,S1 and S2 within normal limits, no S3, no S4, no clicks, no rubs, *** murmurs ABD:  Flat, positive bowel sounds normal in frequency in pitch, no bruits, no rebound, no guarding, no midline pulsatile mass, no hepatomegaly, no splenomegaly, *** EXT:  2 plus pulses throughout, no edema, no cyanosis no clubbing SKIN:  No rashes no nodules NEURO:  Cranial nerves II through XII grossly intact, motor grossly intact throughout PSYCH:  Cognitively intact, oriented to person place and time    EKG:  EKG {ACTION; IS/IS [id number] ordered today. The ekg ordered today demonstrates ***   Recent Labs: 12/15/2018: ALT 11 01/06/2019: BUN 16; Creatinine, Ser 0.66; Hemoglobin 12.2; Platelets 213; Potassium 3.4; Sodium 139    Lipid Panel No results found for: CHOL, TRIG, HDL, CHOLHDL, VLDL, LDLCALC, LDLDIRECT    Wt Readings from Last 3 Encounters:  05/02/17 168 lb 3.4 oz (76.3 kg)  04/30/17 168 lb 3.4 oz (76.3 kg)  04/26/17 168 lb 3.4 oz (76.3 kg)      Other studies Reviewed: Additional studies/ records that were reviewed today include: ***. Review of the above records demonstrates:  Please see elsewhere in the note.  ***   ASSESSMENT AND PLAN:  ATRIAL FIB:  ***  HTN:  ***   COVID EDUCATION:  ***   Current medicines are reviewed at length with the patient today.  The patient {ACTIONS; HAS/DOES NOT HAVE:19233} concerns regarding medicines.  The following changes have been made:  {PLAN; NO CHANGE:13088:s}  Labs/ tests ordered today include: *** No orders of the defined types were placed in this encounter.    Disposition:   FU with ***    Signed, Minus Breeding, MD   05/16/2019 6:16 PM    [facility]

## 2019-05-19 ENCOUNTER — Telehealth: Payer: Self-pay | Admitting: *Deleted

## 2019-05-19 ENCOUNTER — Ambulatory Visit: Payer: Medicare Other | Admitting: Cardiology

## 2019-05-19 NOTE — Telephone Encounter (Signed)
Patient scheduled to see Dr Percival Spanish today and did not show for visit. Called number listed and it is facility where patient resides. Facility was unaware of the appointment. Facility will have someone call and reschedule visit

## 2019-05-21 NOTE — Telephone Encounter (Signed)
Patient is rescheduled for 06/02/2019

## 2019-05-26 ENCOUNTER — Ambulatory Visit: Payer: Medicare Other

## 2019-06-02 ENCOUNTER — Ambulatory Visit: Payer: Medicare Other | Admitting: Cardiology

## 2019-07-02 ENCOUNTER — Ambulatory Visit: Payer: Medicare Other

## 2019-07-08 NOTE — Progress Notes (Signed)
Cardiology Office Note   Date:  07/09/2019   ID:  [name], DOB April 12, 1931, MRN [id number]  PCP:  [name], MD  Cardiologist:   Minus Breeding, MD Referring:  [name], MD  No chief complaint on file.     History of Present Illness: [name] is a 86 y.o. female who is referred by [name], MD for evaluation of a history of atrial fibrillation.  The patient was seen apparently some years ago by Dr. Radford Pax.  Is apparently a history of atrial fibrillation though I do not have all of these records.  I do see an echocardiogram from 2011 that demonstrated left atrial enlargement mild mitral valve regurgitation with well-preserved ejection fraction.  She is on anticoagulation.  She is on low-dose amiodarone.  She has other multiple medical problems and lives in a retirement home.  She gets around with a walker walking to the dining hall a couple of times a day.  She seems to be cognitively very good and does not recall any recent palpitations, presyncope or syncope.  She does not have any chest pressure, neck or arm discomfort.  She has some back problems.  She denies any new shortness of breath, PND or orthopnea.  She has had by her report of 30 pound weight loss and is now below 60 kg.     Past Medical History:  Diagnosis Date  . Anxiety   . Atrial fibrillation ([place])   . Breast CA ([place])   . Constipation   . Depression   . Disorder of bone density and structure, unspecified   . Fibromyalgia   . Hyperkalemia   . Hyperlipemia   . Hypertension   . Impaired fasting glucose   . Retention of urine, unspecified   . Scoliosis   . Thyroid disease     Past Surgical History:  Procedure Laterality Date  . HIP FRACTURE SURGERY       Current Outpatient Medications  Medication Sig Dispense Refill  . acetaminophen (TYLENOL) 500 MG tablet Take 500 mg by mouth every 4 (four) hours.    Marland Kitchen amiodarone (PACERONE) 100 MG tablet Take 1 tablet (100 mg total)  daily by mouth. 30 tablet 1  . amLODipine (NORVASC) 5 MG tablet Take 5 mg by mouth daily.    Marland Kitchen apixaban (ELIQUIS) 2.5 MG TABS tablet Take 1 tablet (2.5 mg total) by mouth 2 (two) times daily. 30 tablet 11  . baclofen (LIORESAL) 10 MG tablet Take 1 tablet (10 mg total) by mouth 3 (three) times daily. 9 each 0  . benazepril (LOTENSIN) 20 MG tablet Take 20 mg by mouth at bedtime.    . benazepril (LOTENSIN) 40 MG tablet Take 40 mg by mouth daily.    . calcium carbonate (TUMS - DOSED IN MG ELEMENTAL CALCIUM) 500 MG chewable tablet Chew 2 tablets by mouth as needed for indigestion or heartburn.    . carboxymethylcellulose (REFRESH PLUS) 0.5 % SOLN Place 1 drop into both eyes daily as needed (dry eyes).     . cephALEXin (KEFLEX) 500 MG capsule Take 2 capsules (1,000 mg total) by mouth 2 (two) times daily. 28 capsule 0  . furosemide (LASIX) 80 MG tablet Take 80 mg by mouth every morning.      . gabapentin (NEURONTIN) 100 MG capsule Take 200 mg 4 (four) times daily by mouth.    Marland Kitchen HYDROcodone-acetaminophen (NORCO/VICODIN) 5-325 MG tablet Take 1 tablet by mouth every 8 (eight) hours as needed. 10  tablet 0  . levothyroxine (SYNTHROID, LEVOTHROID) 50 MCG tablet Take 50 mcg by mouth daily.      Marland Kitchen loperamide (IMODIUM) 2 MG capsule Take 2 mg by mouth 4 (four) times daily as needed. For loose stools/diarrhea     . menthol-zinc oxide (GOLD BOND) powder Apply 1 application topically daily.    [name] SULFATE VAGINAL (TRIMO-SAN) 0.025 % GEL Place 1 application vaginally at bedtime. On Tuesdays and Fridays    . polyethylene glycol (MIRALAX / GLYCOLAX) packet Take 17 g by mouth daily as needed. For constipation.     . potassium chloride SA (K-DUR,KLOR-CON) 20 MEQ tablet Take 20 mEq by mouth daily.      . tamoxifen (NOLVADEX) 10 MG tablet Take 10 mg by mouth daily.      Marland Kitchen tolnaftate (TINACTIN) 1 % powder Apply 1 application topically as needed for itching.    . traMADol (ULTRAM) 50 MG tablet Take 1 tablet (50 mg  total) by mouth every 8 (eight) hours as needed for moderate pain. 30 tablet 0   No current facility-administered medications for this visit.    Allergies:   Sulfa antibiotics    Social History:  The patient  reports that she has never smoked. She has never used smokeless tobacco. She reports that she does not drink alcohol or use drugs.   Family History:  The patient's family history includes Hypertension in an other family member.    ROS:  Please see the history of present illness.   Otherwise, review of systems are positive for none.   All other systems are reviewed and negative.    PHYSICAL EXAM: VS:  BP 105/62   Pulse 68   Temp 98.2 F (36.8 C)   Ht 5' (1.524 m)   Wt 130 lb (59 kg)   SpO2 94%   BMI 25.39 kg/m  , BMI Body mass index is 25.39 kg/m. GEN:  No distress, somewhat frail NECK:  No jugular venous distention at 90 degrees, waveform within normal limits, carotid upstroke brisk and symmetric, no bruits, no thyromegaly LYMPHATICS:  No cervical adenopathy LUNGS: Few basilar crackles BACK:  No CVA tenderness CHEST:  Unremarkable HEART:  S1 and S2 within normal limits, no S3, no S4, no clicks, no rubs, soft systolic murmur radiating slightly at the aortic outflow tract, no diastolic murmurs ABD:  Positive bowel sounds normal in frequency in pitch, no bruits, no rebound, no guarding, unable to assess midline mass or bruit with the patient seated. EXT:  2 plus pulses throughout, moderate edema, no cyanosis no clubbing SKIN:  No rashes no nodules NEURO:  Cranial nerves II through XII grossly intact, motor grossly intact throughout PSYCH:  Cognitively intact, oriented to person place and time    EKG:  EKG is ordered today. The ekg ordered today demonstrates sinus rhythm, rate 68, axis within normal limits, intervals within normal limits, P waves not clearly identified and it could be junctional but I suspect sinus   Recent Labs: 12/15/2018: ALT 11 01/06/2019: BUN 16;  Creatinine, Ser 0.66; Hemoglobin 12.2; Platelets 213; Potassium 3.4; Sodium 139    Lipid Panel No results found for: CHOL, TRIG, HDL, CHOLHDL, VLDL, LDLCALC, LDLDIRECT    Wt Readings from Last 3 Encounters:  07/09/19 130 lb (59 kg)  05/02/17 168 lb 3.4 oz (76.3 kg)  04/30/17 168 lb 3.4 oz (76.3 kg)      Other studies Reviewed: Additional studies/ records that were reviewed today include: Labs from PCP, previous echo  report. Review of the above records demonstrates:  Please see elsewhere in the note.     ASSESSMENT AND PLAN:  ATRIAL FIB: Unfortunately do not have past history to corroborate the fibrillation but I will assume that this was an issue and she is maintained on a low-dose of amiodarone.  She has regular rhythm now.  I would not see a reason to discontinue this though I am going to check a TSH and liver profile.  I did suggest this be done at least once a year.  She is also on a higher dose of Eliquis with her weight loss than she needs to be.  I discussed this with her and I written an order for her to be reduced to 2.5 mg twice daily.  Otherwise at this point no change in therapy.  Ms. ROSHAUNDA CHOMA has a CHA2DS2 - VASc score of 4.    HTN:  The blood pressure is at target. No change in medications is indicated. We will continue with therapeutic lifestyle changes (TLC).  Continue current therapy.   COVID EDUCATION: She has gotten her vaccine.   Current medicines are reviewed at length with the patient today.  The patient does not have concerns regarding medicines.  The following changes have been made:  As above  Labs/ tests ordered today include: See above  Orders Placed This Encounter  Procedures  . CBC  . TSH  . Comprehensive metabolic panel  . EKG 12-Lead     Disposition:   FU with me in 18 months.     Signed, Minus Breeding, MD  07/09/2019 12:41 PM    [facility]

## 2019-07-09 ENCOUNTER — Encounter: Payer: Self-pay | Admitting: Cardiology

## 2019-07-09 ENCOUNTER — Other Ambulatory Visit: Payer: Self-pay

## 2019-07-09 ENCOUNTER — Ambulatory Visit (INDEPENDENT_AMBULATORY_CARE_PROVIDER_SITE_OTHER): Payer: Medicare Other | Admitting: Cardiology

## 2019-07-09 VITALS — BP 105/62 | HR 68 | Temp 98.2°F | Ht 60.0 in | Wt 130.0 lb

## 2019-07-09 DIAGNOSIS — I480 Paroxysmal atrial fibrillation: Secondary | ICD-10-CM | POA: Diagnosis not present

## 2019-07-09 MED ORDER — APIXABAN 2.5 MG PO TABS: 2.5000 mg | ORAL_TABLET | Freq: Two times a day (BID) | ORAL | 11 refills | Status: AC

## 2019-07-09 NOTE — Patient Instructions (Signed)
Medication Instructions:  Decrease Eliquis to 2.5mg  twice a day *If you need a refill on your cardiac medications before your next appointment, please call your pharmacy*  Lab Work: Have CBC, TSH, and CMP drawn and results sent to Dr. Percival Spanish - Fax [phone]  Testing/Procedures: None   Follow-Up: At [facility], you and your health needs are our priority.  As part of our continuing mission to provide you with exceptional heart care, we have created designated Provider Care Teams.  These Care Teams include your primary Cardiologist (physician) and Advanced Practice Providers (APPs -  Physician Assistants and Nurse Practitioners) who all work together to provide you with the care you need, when you need it.  Your next appointment:   18 month(s)  The format for your next appointment:   In Person  Provider:   Minus Breeding, MD

## 2019-07-12 ENCOUNTER — Other Ambulatory Visit: Payer: Self-pay

## 2019-07-12 ENCOUNTER — Emergency Department (HOSPITAL_COMMUNITY): Payer: Medicare Other

## 2019-07-12 ENCOUNTER — Emergency Department (HOSPITAL_COMMUNITY)
Admission: EM | Admit: 2019-07-12 | Discharge: 2019-07-12 | Disposition: A | Discharge status: Home / Self Care | Payer: Medicare Other | Attending: Emergency Medicine | Admitting: Emergency Medicine

## 2019-07-12 ENCOUNTER — Emergency Department (HOSPITAL_BASED_OUTPATIENT_CLINIC_OR_DEPARTMENT_OTHER): Payer: Medicare Other

## 2019-07-12 DIAGNOSIS — N3000 Acute cystitis without hematuria: Secondary | ICD-10-CM | POA: Diagnosis not present

## 2019-07-12 DIAGNOSIS — I10 Essential (primary) hypertension: Secondary | ICD-10-CM | POA: Insufficient documentation

## 2019-07-12 DIAGNOSIS — E039 Hypothyroidism, unspecified: Secondary | ICD-10-CM | POA: Insufficient documentation

## 2019-07-12 DIAGNOSIS — Z79899 Other long term (current) drug therapy: Secondary | ICD-10-CM | POA: Diagnosis not present

## 2019-07-12 DIAGNOSIS — M79662 Pain in left lower leg: Secondary | ICD-10-CM | POA: Diagnosis not present

## 2019-07-12 DIAGNOSIS — R55 Syncope and collapse: Secondary | ICD-10-CM | POA: Insufficient documentation

## 2019-07-12 DIAGNOSIS — Z7901 Long term (current) use of anticoagulants: Secondary | ICD-10-CM | POA: Diagnosis not present

## 2019-07-12 DIAGNOSIS — L03115 Cellulitis of right lower limb: Secondary | ICD-10-CM | POA: Diagnosis not present

## 2019-07-12 DIAGNOSIS — M7989 Other specified soft tissue disorders: Secondary | ICD-10-CM | POA: Diagnosis not present

## 2019-07-12 DIAGNOSIS — Z853 Personal history of malignant neoplasm of breast: Secondary | ICD-10-CM | POA: Insufficient documentation

## 2019-07-12 DIAGNOSIS — Z20822 Contact with and (suspected) exposure to covid-19: Secondary | ICD-10-CM | POA: Insufficient documentation

## 2019-07-12 LAB — SARS CORONAVIRUS 2 (TAT 6-24 HRS): SARS Coronavirus 2: NEGATIVE

## 2019-07-12 LAB — DIFFERENTIAL
Abs Immature Granulocytes: 0.05 10*3/uL (ref 0.00–0.07)
Basophils Absolute: 0.1 10*3/uL (ref 0.0–0.1)
Basophils Relative: 1 %
Eosinophils Absolute: 0.3 10*3/uL (ref 0.0–0.5)
Eosinophils Relative: 3 %
Immature Granulocytes: 1 %
Lymphocytes Relative: 12 %
Lymphs Abs: 1.1 10*3/uL (ref 0.7–4.0)
Monocytes Absolute: 0.7 10*3/uL (ref 0.1–1.0)
Monocytes Relative: 8 %
Neutro Abs: 6.9 10*3/uL (ref 1.7–7.7)
Neutrophils Relative %: 75 %

## 2019-07-12 LAB — URINALYSIS, ROUTINE W REFLEX MICROSCOPIC
Bilirubin Urine: NEGATIVE
Glucose, UA: NEGATIVE mg/dL
Ketones, ur: NEGATIVE mg/dL
Nitrite: NEGATIVE
Protein, ur: NEGATIVE mg/dL
Specific Gravity, Urine: 1.006 (ref 1.005–1.030)
pH: 5 (ref 5.0–8.0)

## 2019-07-12 LAB — CBC
HCT: 40.2 % (ref 36.0–46.0)
Hemoglobin: 12 g/dL (ref 12.0–15.0)
MCH: 28 pg (ref 26.0–34.0)
MCHC: 29.9 g/dL — ABNORMAL LOW (ref 30.0–36.0)
MCV: 93.9 fL (ref 80.0–100.0)
Platelets: 262 10*3/uL (ref 150–400)
RBC: 4.28 MIL/uL (ref 3.87–5.11)
RDW: 15.2 % (ref 11.5–15.5)
WBC: 9.1 10*3/uL (ref 4.0–10.5)
nRBC: 0 % (ref 0.0–0.2)

## 2019-07-12 LAB — COMPREHENSIVE METABOLIC PANEL
ALT: 13 U/L (ref 0–44)
AST: 17 U/L (ref 15–41)
Albumin: 3.4 g/dL — ABNORMAL LOW (ref 3.5–5.0)
Alkaline Phosphatase: 39 U/L (ref 38–126)
Anion gap: 11 (ref 5–15)
BUN: 20 mg/dL (ref 8–23)
CO2: 24 mmol/L (ref 22–32)
Calcium: 8.9 mg/dL (ref 8.9–10.3)
Chloride: 105 mmol/L (ref 98–111)
Creatinine, Ser: 0.79 mg/dL (ref 0.44–1.00)
GFR calc Af Amer: >60 mL/min (ref >60)
GFR calc non Af Amer: >60 mL/min (ref >60)
Glucose, Bld: 142 mg/dL — ABNORMAL HIGH (ref 70–99)
Potassium: 3.7 mmol/L (ref 3.5–5.1)
Sodium: 140 mmol/L (ref 135–145)
Total Bilirubin: 1 mg/dL (ref 0.3–1.2)
Total Protein: 6.4 g/dL — ABNORMAL LOW (ref 6.5–8.1)

## 2019-07-12 LAB — RAPID URINE DRUG SCREEN, HOSP PERFORMED
Amphetamines: NOT DETECTED
Barbiturates: NOT DETECTED
Benzodiazepines: NOT DETECTED
Cocaine: NOT DETECTED
Opiates: NOT DETECTED
Tetrahydrocannabinol: NOT DETECTED

## 2019-07-12 LAB — PROTIME-INR
INR: 1.3 — ABNORMAL HIGH (ref 0.8–1.2)
Prothrombin Time: 16.5 seconds — ABNORMAL HIGH (ref 11.4–15.2)

## 2019-07-12 LAB — APTT: aPTT: 27 seconds (ref 24–36)

## 2019-07-12 MED ORDER — CEPHALEXIN 500 MG PO CAPS: 500.0000 mg | ORAL_CAPSULE | Freq: Four times a day (QID) | ORAL | 0 refills | Status: DC

## 2019-07-12 MED ORDER — ACETAMINOPHEN 500 MG PO TABS
500.0000 mg | ORAL_TABLET | ORAL | Status: DC
  Administered 2019-07-12: 15:00:00 500 mg via ORAL
  Filled 2019-07-12: qty 1

## 2019-07-12 MED ORDER — GABAPENTIN 100 MG PO CAPS
200.0000 mg | ORAL_CAPSULE | Freq: Four times a day (QID) | ORAL | Status: DC
  Administered 2019-07-12: 15:00:00 200 mg via ORAL
  Filled 2019-07-12: qty 2

## 2019-07-12 MED ORDER — TRAMADOL HCL 50 MG PO TABS
50.0000 mg | ORAL_TABLET | Freq: Three times a day (TID) | ORAL | Status: DC | PRN
  Administered 2019-07-12: 50 mg via ORAL
  Filled 2019-07-12: qty 1

## 2019-07-12 NOTE — ED Notes (Signed)
Pt had small bowel movement. Peri care performed, purewick replaced, and chuck replaced. Pt repositioned in bed.

## 2019-07-12 NOTE — ED Provider Notes (Addendum)
[facility] EMERGENCY DEPARTMENT Provider Note   CSN: [id number] Arrival date & time: 07/12/19  1050     History Chief Complaint  Patient presents with  . Loss of Consciousness    [name] is a 86 y.o. female.  HPI   Patient presents to the emergency room for evaluation of a syncopal episode.  Patient is a resident of assisted living facility.  According to the EMS report the patient was sitting on the commode when she had a syncopal episode.  Per the EMS report she was unconscious for 10 minutes.  Patient did not fall however she did have an episode of vomiting.  EMS was called and the patient was brought to the ED for evaluation.  Patient states she has some chronic pain in her left leg that causes some mobility issues for her but she denies any new issues.  She is not having any chest pain or shortness of breath.  She denies any abdominal pain.  No headache.  No numbness or weakness.  Patient states she has been having swelling and redness of her right leg.  This has been going on for few weeks. Past Medical History:  Diagnosis Date  . Anxiety   . Atrial fibrillation ([place])   . Breast CA ([place])   . Constipation   . Depression   . Disorder of bone density and structure, unspecified   . Fibromyalgia   . Hyperkalemia   . Hyperlipemia   . Hypertension   . Impaired fasting glucose   . Retention of urine, unspecified   . Scoliosis   . Thyroid disease     Patient Active Problem List   Diagnosis Date Noted  . Educated about COVID-19 virus infection 05/16/2019  . Non-allergic rhinitis 05/01/2017  . Pressure injury of skin 04/23/2017  . Aspiration pneumonia ([place]) 04/22/2017  . Acute hypoxemic respiratory failure ([place]) 04/22/2017  . HCAP (healthcare-associated pneumonia) 03/27/2017  . Respiratory failure with hypoxia ([place]) 11/02/2015  . Depression 11/02/2015  . Anxiety 11/02/2015  . Hypertension 11/02/2015  . Paroxysmal atrial fibrillation ([place])  11/02/2015  . Hypothyroidism 11/02/2015  . Chronic venous stasis dermatitis 11/02/2015  . Chronic pain 11/02/2015  . Breast cancer ([place]) 11/02/2015    Past Surgical History:  Procedure Laterality Date  . HIP FRACTURE SURGERY       OB History   No obstetric history on file.     Family History  Problem Relation Age of Onset  . Hypertension Other     Social History   Tobacco Use  . Smoking status: Never Smoker  . Smokeless tobacco: Never Used  Substance Use Topics  . Alcohol use: No  . Drug use: No    Home Medications Prior to Admission medications   Medication Sig Start Date End Date Taking? Authorizing Provider  acetaminophen (TYLENOL) 500 MG tablet Take 500 mg by mouth every 4 (four) hours.   Yes [provider]  amiodarone (PACERONE) 100 MG tablet Take 1 tablet (100 mg total) daily by mouth. 03/29/17  Yes Barton Dubois, MD  amLODipine (NORVASC) 5 MG tablet Take 5 mg by mouth daily.   Yes [provider]  apixaban (ELIQUIS) 2.5 MG TABS tablet Take 1 tablet (2.5 mg total) by mouth 2 (two) times daily. 07/09/19  Yes Minus Breeding, MD  baclofen (LIORESAL) 10 MG tablet Take 1 tablet (10 mg total) by mouth 3 (three) times daily. 04/25/17  Yes Patrecia Pour, MD  barrier cream (NON-SPECIFIED) CREA Apply  1 application topically 2 (two) times daily as needed (for redness to bottom).   Yes [provider]  benazepril (LOTENSIN) 40 MG tablet Take 40 mg by mouth daily.   Yes [provider]  carboxymethylcellulose (REFRESH PLUS) 0.5 % SOLN Place 1 drop into both eyes daily as needed (dry eyes).    Yes [provider]  furosemide (LASIX) 80 MG tablet Take 80 mg by mouth every morning.     Yes [provider]  gabapentin (NEURONTIN) 100 MG capsule Take 200 mg 4 (four) times daily by mouth.   Yes [provider]  guaiFENesin (MUCINEX) 600 MG 12 hr tablet Take 600 mg by mouth 2 (two) times daily as needed for cough.   Yes  [provider]  HYDROcodone-acetaminophen (NORCO/VICODIN) 5-325 MG tablet Take 1 tablet by mouth every 8 (eight) hours as needed. Patient taking differently: Take 1 tablet by mouth every 8 (eight) hours as needed for moderate pain.  01/06/19  Yes Virgel Manifold, MD  levothyroxine (SYNTHROID, LEVOTHROID) 50 MCG tablet Take 50 mcg by mouth daily.     Yes [provider]  loperamide (IMODIUM) 2 MG capsule Take 2 mg by mouth 4 (four) times daily as needed. For loose stools/diarrhea    Yes [provider]  menthol-zinc oxide (GOLD BOND) powder Apply 1 application topically daily.   Yes [provider]  nystatin (MYCOSTATIN/NYSTOP) powder Apply 1 application topically 3 (three) times daily as needed (to affected skin).   Yes [provider]  OXYQUINOLONE SULFATE VAGINAL (TRIMO-SAN) 0.025 % GEL Place 1 application vaginally See admin instructions. On Fridays   Yes [provider]  polyethylene glycol (MIRALAX / GLYCOLAX) packet Take 17 g by mouth daily as needed. For constipation.    Yes [provider]  potassium chloride SA (K-DUR,KLOR-CON) 20 MEQ tablet Take 20 mEq by mouth daily.     Yes [provider]  Pramox-PE-Glycerin-Petrolatum (PREPARATION H) 1-0.25-14.4-15 % CREA Place 1 application rectally every 6 (six) hours as needed (hemorrhoids).   Yes [provider]  tamoxifen (NOLVADEX) 10 MG tablet Take 10 mg by mouth daily.     Yes [provider]  traMADol (ULTRAM) 50 MG tablet Take 1 tablet (50 mg total) by mouth every 8 (eight) hours as needed for moderate pain. Patient taking differently: Take 50 mg by mouth every 6 (six) hours as needed for moderate pain.  05/02/17  Yes Medina-Vargas, Monina C, NP  trolamine salicylate (ASPERCREME) 10 % cream Apply 1 application topically every 8 (eight) hours as needed (low back pain).   Yes [provider]  cephALEXin (KEFLEX) 500 MG capsule Take 2 capsules (1,000  mg total) by mouth 2 (two) times daily. Patient not taking: Reported on 07/12/2019 12/15/18   Dalia Heading, PA-C    Allergies    Sulfa antibiotics  Review of Systems   Review of Systems  All other systems reviewed and are negative.   Physical Exam Updated Vital Signs BP 140/64   Pulse 74   Temp 97.6 F (36.4 C) (Oral)   Resp 20   SpO2 96%   Physical Exam Vitals and nursing note reviewed.  Constitutional:      Appearance: She is well-developed.     Comments: Elderly, frail  HENT:     Head: Normocephalic and atraumatic.     Right Ear: External ear normal.     Left Ear: External ear normal.  Eyes:     General: No scleral icterus.  Right eye: No discharge.        Left eye: No discharge.     Conjunctiva/sclera: Conjunctivae normal.  Neck:     Trachea: No tracheal deviation.  Cardiovascular:     Rate and Rhythm: Normal rate and regular rhythm.  Pulmonary:     Effort: Pulmonary effort is normal. No respiratory distress.     Breath sounds: Normal breath sounds. No stridor. No wheezing or rales.  Abdominal:     General: Bowel sounds are normal. There is no distension.     Palpations: Abdomen is soft.     Tenderness: There is no abdominal tenderness. There is no guarding or rebound.  Musculoskeletal:        General: Tenderness present.     Right shoulder: No swelling, tenderness or bony tenderness.     Left shoulder: No swelling, tenderness or bony tenderness.     Right wrist: No swelling, tenderness or bony tenderness.     Left wrist: No swelling, tenderness or bony tenderness.     Cervical back: Neck supple. No swelling, tenderness or bony tenderness.     Thoracic back: No swelling, tenderness or bony tenderness.     Lumbar back: No swelling, tenderness or bony tenderness.     Right hip: No tenderness or bony tenderness. Normal range of motion.     Left hip: No tenderness or bony tenderness. Normal range of motion.     Right lower leg: Edema present.      Right ankle: No swelling. No tenderness.     Left ankle: No swelling. No tenderness.     Comments: Asymmetric edema right greater than left lower extremity, erythema of the lower extremity up to the knee  Skin:    General: Skin is warm and dry.     Findings: No rash.  Neurological:     Mental Status: She is alert. Mental status is at baseline.     Cranial Nerves: No cranial nerve deficit (no facial droop, extraocular movements intact, no slurred speech).     Sensory: No sensory deficit.     Motor: Weakness present. No abnormal muscle tone or seizure activity.     Coordination: Coordination normal.     Comments: Difficulty lifting legs off the bed, 5/5 plantar strength bilaterally, normal ue strength     ED Results / Procedures / Treatments   Labs (all labs ordered are listed, but only abnormal results are displayed) Labs Reviewed  PROTIME-INR - Abnormal; Notable for the following components:      Result Value   Prothrombin Time 16.5 (*)    INR 1.3 (*)    All other components within normal limits  CBC - Abnormal; Notable for the following components:   MCHC 29.9 (*)    All other components within normal limits  COMPREHENSIVE METABOLIC PANEL - Abnormal; Notable for the following components:   Glucose, Bld 142 (*)    Total Protein 6.4 (*)    Albumin 3.4 (*)    All other components within normal limits  URINALYSIS, ROUTINE W REFLEX MICROSCOPIC - Abnormal; Notable for the following components:   APPearance HAZY (*)    Hgb urine dipstick SMALL (*)    Leukocytes,Ua MODERATE (*)    Bacteria, UA MANY (*)    All other components within normal limits  SARS CORONAVIRUS 2 (TAT 6-24 HRS)  APTT  DIFFERENTIAL  RAPID URINE DRUG SCREEN, HOSP PERFORMED    EKG EKG Interpretation  Date/Time:  Sunday July 12 2019 11:30:56 EST  Ventricular Rate:  58 PR Interval:    QRS Duration: 92 QT Interval:  451 QTC Calculation: 443 R Axis:   49 Text Interpretation: atrial fibrillation Low  voltage, extremity leads Anteroseptal infarct, age indeterminate No significant change since last tracing Confirmed by Dorie Rank [phone]) on 07/12/2019 1:08:04 PM   Radiology DG Chest Portable 1 View  Result Date: 07/12/2019 CLINICAL DATA:  Syncope. EXAM: PORTABLE CHEST 1 VIEW COMPARISON:  05/16/2017 radiograph and 05/25/2017 CT FINDINGS: The patient is rotated and leaning to the RIGHT. Mild cardiomegaly and moderate hiatal hernia again noted. There is no evidence of focal airspace disease, pulmonary edema, suspicious pulmonary nodule/mass, pleural effusion, or pneumothorax. INFERIOR dislocation of the RIGHT humeral head is new since [id number] but of uncertain chronicity. Sclerosis and deformity of the RIGHT humeral head is noted. IMPRESSION: 1. No evidence of acute cardiopulmonary disease. 2. INFERIOR dislocation of the RIGHT humeral head of uncertain chronicity. Correlate clinically. 3. Cardiomegaly and hiatal hernia. Electronically Signed   By: Margarette Canada M.D.   On: 07/12/2019 12:00    Procedures Procedures (including critical care time)  Medications Ordered in ED Medications  acetaminophen (TYLENOL) tablet 500 mg (500 mg Oral Given 07/12/19 1515)  gabapentin (NEURONTIN) capsule 200 mg (200 mg Oral Given 07/12/19 1515)  traMADol (ULTRAM) tablet 50 mg (50 mg Oral Given 07/12/19 1515)    ED Course  I have reviewed the triage vital signs and the nursing notes.  Pertinent labs & imaging results that were available during my care of the patient were reviewed by me and considered in my medical decision making (see chart for details).  Clinical Course as of Jul 11 1534  Sun Jul 12, 2019  1204 CT scan ordered.  Pt states she did not hit her head and does not want it performed.   [id number] Patient has been asking to leave however after getting completely undressed she did show me her right leg.  She does have erythema and edema that appears greater than the left.  Questionable cellulitis versus  chronic venous stasis dermatitis versus DVT.  We will proceed with ultrasound   [JK]    Clinical Course User Index [JK] Dorie Rank, MD   MDM Rules/Calculators/A&P                      Pt presents with a syncopal episode while on the commode.  Possibly vasovagal.  Labs do suggest uti.  Pt does have leg swelling.  Doppler study is pending.  If negative will treat for cellulitis.  Discuss with patient whether or not she would agree to stay vs close follow up nursing facility.  If positive for DVT anticipate admission, need for pe study.  Care turned over to Dr Sherry Ruffing. Final Clinical Impression(s) / ED Diagnoses pending   Dorie Rank, MD 07/12/19 1538 Doppler study is negative.  Discussed the findings with the patient.  I discussed staying in the hospital overnight for observation because of the fainting spell.  Patient states she does not want to stay in the hospital.  She feels well enough to go back to her nursing facility.  I will give her a prescription for Keflex.  Discussed warning signs and precautions.   Dorie Rank, MD 07/12/19 [phone]

## 2019-07-12 NOTE — ED Notes (Addendum)
Pt brief changed d/t BM. Pericare performed. Pt expresses anxiety about not having received tramadol, tylenol, gabapentin that she takes at home; EDP notified.

## 2019-07-12 NOTE — Discharge Instructions (Signed)
Follow up with your doctor.  Take the medications as prescribed.   Return to the ED for worsening symptoms

## 2019-07-12 NOTE — ED Notes (Signed)
Called [facility] 

## 2019-07-12 NOTE — ED Notes (Signed)
Pt states that she believes her "R leg is what is casuing all of this." R limb examined, swelling >L leg. Wearing thigh-high TED hose upon arrival. Area of abnormal skin similar in texture to seborrheic keratoses noted to R ankle, darkened skin tone noted to bilat lower limbs. Pt transported to CT but returned to room after refusing head CT; "there is nothing wrong with me except my right leg."

## 2019-07-12 NOTE — ED Notes (Signed)
Pt expresses eagerness to be home, explained to pt importance of waiting for tests to result.

## 2019-07-12 NOTE — ED Notes (Signed)
Pt called out because she said she pooped. Pt checked and pt did not poop.

## 2019-07-12 NOTE — Progress Notes (Signed)
VASCULAR LAB PRELIMINARY  PRELIMINARY  PRELIMINARY  PRELIMINARY  Right lower extremity venous duplex completed.    Preliminary report:  See CV proc for preliminary results.   Gave Dr. Tomi Bamberger report.   Cathleen Yagi, RVT 07/12/2019, 3:59 PM

## 2019-07-12 NOTE — ED Notes (Signed)
Encouraged pt to accept head CT, explained importance to pt. Pt maintains that she does not want CT scan

## 2019-07-12 NOTE — ED Notes (Signed)
ED Provider at bedside. 

## 2019-07-12 NOTE — ED Notes (Signed)
Pt brief changed due to bowel movement. Small dots of blood observed in stool.

## 2019-07-12 NOTE — ED Triage Notes (Signed)
Pt here from [facility] after syncopal episode while on the toilet, loc for 10 mins per staff. No injury, did not fall. Pt did vomit. Pt A/O x 4 on arrival, sts there's nothing wrong with her now, chronic pain to back and knees. 500 cc bolus given in route.

## 2019-08-11 ENCOUNTER — Ambulatory Visit: Payer: Medicare Other

## 2019-09-18 ENCOUNTER — Emergency Department (HOSPITAL_COMMUNITY)
Admission: EM | Admit: 2019-09-18 | Discharge: 2019-09-18 | Disposition: A | Discharge status: Skilled Nursing Facility | Payer: Medicare Other | Attending: Emergency Medicine | Admitting: Emergency Medicine

## 2019-09-18 ENCOUNTER — Emergency Department (HOSPITAL_COMMUNITY): Payer: Medicare Other

## 2019-09-18 ENCOUNTER — Encounter (HOSPITAL_COMMUNITY): Payer: Self-pay | Admitting: Obstetrics and Gynecology

## 2019-09-18 ENCOUNTER — Other Ambulatory Visit: Payer: Self-pay

## 2019-09-18 DIAGNOSIS — Z853 Personal history of malignant neoplasm of breast: Secondary | ICD-10-CM | POA: Insufficient documentation

## 2019-09-18 DIAGNOSIS — E039 Hypothyroidism, unspecified: Secondary | ICD-10-CM | POA: Insufficient documentation

## 2019-09-18 DIAGNOSIS — Y999 Unspecified external cause status: Secondary | ICD-10-CM | POA: Insufficient documentation

## 2019-09-18 DIAGNOSIS — S2001XA Contusion of right breast, initial encounter: Secondary | ICD-10-CM | POA: Insufficient documentation

## 2019-09-18 DIAGNOSIS — Y939 Activity, unspecified: Secondary | ICD-10-CM | POA: Diagnosis not present

## 2019-09-18 DIAGNOSIS — Z79899 Other long term (current) drug therapy: Secondary | ICD-10-CM | POA: Insufficient documentation

## 2019-09-18 DIAGNOSIS — Y929 Unspecified place or not applicable: Secondary | ICD-10-CM | POA: Insufficient documentation

## 2019-09-18 DIAGNOSIS — Z7901 Long term (current) use of anticoagulants: Secondary | ICD-10-CM | POA: Insufficient documentation

## 2019-09-18 DIAGNOSIS — X58XXXA Exposure to other specified factors, initial encounter: Secondary | ICD-10-CM | POA: Diagnosis not present

## 2019-09-18 DIAGNOSIS — N6489 Other specified disorders of breast: Secondary | ICD-10-CM

## 2019-09-18 DIAGNOSIS — I10 Essential (primary) hypertension: Secondary | ICD-10-CM | POA: Diagnosis not present

## 2019-09-18 DIAGNOSIS — S299XXA Unspecified injury of thorax, initial encounter: Secondary | ICD-10-CM | POA: Diagnosis present

## 2019-09-18 LAB — CBC WITH DIFFERENTIAL/PLATELET
Abs Immature Granulocytes: 0.03 10*3/uL (ref 0.00–0.07)
Basophils Absolute: 0 10*3/uL (ref 0.0–0.1)
Basophils Relative: 1 %
Eosinophils Absolute: 0.2 10*3/uL (ref 0.0–0.5)
Eosinophils Relative: 3 %
HCT: 40.3 % (ref 36.0–46.0)
Hemoglobin: 12.6 g/dL (ref 12.0–15.0)
Immature Granulocytes: 0 %
Lymphocytes Relative: 19 %
Lymphs Abs: 1.5 10*3/uL (ref 0.7–4.0)
MCH: 28.5 pg (ref 26.0–34.0)
MCHC: 31.3 g/dL (ref 30.0–36.0)
MCV: 91.2 fL (ref 80.0–100.0)
Monocytes Absolute: 0.8 10*3/uL (ref 0.1–1.0)
Monocytes Relative: 10 %
Neutro Abs: 5.4 10*3/uL (ref 1.7–7.7)
Neutrophils Relative %: 67 %
Platelets: 212 10*3/uL (ref 150–400)
RBC: 4.42 MIL/uL (ref 3.87–5.11)
RDW: 13.5 % (ref 11.5–15.5)
WBC: 8 10*3/uL (ref 4.0–10.5)
nRBC: 0 % (ref 0.0–0.2)

## 2019-09-18 LAB — BASIC METABOLIC PANEL
Anion gap: 11 (ref 5–15)
BUN: 21 mg/dL (ref 8–23)
CO2: 29 mmol/L (ref 22–32)
Calcium: 8.9 mg/dL (ref 8.9–10.3)
Chloride: 100 mmol/L (ref 98–111)
Creatinine, Ser: 0.62 mg/dL (ref 0.44–1.00)
GFR calc Af Amer: >60 mL/min (ref >60)
GFR calc non Af Amer: >60 mL/min (ref >60)
Glucose, Bld: 104 mg/dL — ABNORMAL HIGH (ref 70–99)
Potassium: 4 mmol/L (ref 3.5–5.1)
Sodium: 140 mmol/L (ref 135–145)

## 2019-09-18 LAB — PROTIME-INR
INR: 1.2 (ref 0.8–1.2)
Prothrombin Time: 14.4 seconds (ref 11.4–15.2)

## 2019-09-18 NOTE — ED Notes (Signed)
RN spoke to patient's Son and gave report to facility.

## 2019-09-18 NOTE — ED Notes (Signed)
Patient transported to X-ray 

## 2019-09-18 NOTE — ED Triage Notes (Signed)
Per EMS:  Patient is coming from [facility]. Patient was reportedly bathing this morning and they noted discoloration and bruising to the breast and maxilla.Patient also reporting pain to the axilla and breast region.

## 2019-09-18 NOTE — ED Notes (Signed)
[facility] notified of need for transport. 

## 2019-09-18 NOTE — ED Provider Notes (Signed)
[facility] DEPT Provider Note   CSN: [id number] Arrival date & time: 09/18/19  1253     History Chief Complaint  Patient presents with   Breast discoloration    [name] is a 86 y.o. female.  HPI   Patient presents to the emergency room for evaluation of right breast pain.  Patient states she noticed a yellow area on her breast this morning.  Patient is a resident of [facility].  When the patient was getting bathed this morning they noticed discoloration and bruising to the right breast area.  Patient denies any falls or trauma.  Patient states she does have pain in her right shoulder.  It hurts to move her arm.  Patient states this is related to a pinched nerve that she has.  She states this is not a new finding for her.  Patient states this is not a bruise.  She denies any fevers or chills.  No vomiting or diarrhea.  Medical records indicate the patient does take Eliquis.  Past Medical History:  Diagnosis Date   Anxiety    Atrial fibrillation ([place])    Breast CA ([facility])    Constipation    Depression    Disorder of bone density and structure, unspecified    Fibromyalgia    Hyperkalemia    Hyperlipemia    Hypertension    Impaired fasting glucose    Retention of urine, unspecified    Scoliosis    Thyroid disease     Patient Active Problem List   Diagnosis Date Noted   Educated about COVID-19 virus infection 05/16/2019   Non-allergic rhinitis 05/01/2017   Pressure injury of skin 04/23/2017   Aspiration pneumonia ([place]) 04/22/2017   Acute hypoxemic respiratory failure ([place]) 04/22/2017   HCAP (healthcare-associated pneumonia) 03/27/2017   Respiratory failure with hypoxia ([place]) 11/02/2015   Depression 11/02/2015   Anxiety 11/02/2015   Hypertension 11/02/2015   Paroxysmal atrial fibrillation ([place]) 11/02/2015   Hypothyroidism 11/02/2015   Chronic venous stasis dermatitis 11/02/2015   Chronic  pain 11/02/2015   Breast cancer ([place]) 11/02/2015    Past Surgical History:  Procedure Laterality Date   HIP FRACTURE SURGERY       OB History   No obstetric history on file.     Family History  Problem Relation Age of Onset   Hypertension Other     Social History   Tobacco Use   Smoking status: Never Smoker   Smokeless tobacco: Never Used  Substance Use Topics   Alcohol use: No   Drug use: No    Home Medications Prior to Admission medications   Medication Sig Start Date End Date Taking? Authorizing Provider  acetaminophen (TYLENOL) 500 MG tablet Take 500 mg by mouth every 4 (four) hours.   Yes [provider]  amiodarone (PACERONE) 100 MG tablet Take 1 tablet (100 mg total) daily by mouth. 03/29/17  Yes Barton Dubois, MD  amLODipine (NORVASC) 5 MG tablet Take 5 mg by mouth daily.   Yes [provider]  apixaban (ELIQUIS) 2.5 MG TABS tablet Take 1 tablet (2.5 mg total) by mouth 2 (two) times daily. 07/09/19  Yes Minus Breeding, MD  baclofen (LIORESAL) 10 MG tablet Take 1 tablet (10 mg total) by mouth 3 (three) times daily. 04/25/17  Yes Patrecia Pour, MD  barrier cream (NON-SPECIFIED) CREA Apply 1 application topically 2 (two) times daily as needed (for redness to bottom).   Yes [provider]  benazepril (LOTENSIN)  40 MG tablet Take 40 mg by mouth daily.   Yes [provider]  carboxymethylcellulose (REFRESH PLUS) 0.5 % SOLN Place 1 drop into both eyes daily as needed (dry eyes).    Yes [provider]  furosemide (LASIX) 80 MG tablet Take 80 mg by mouth every morning.     Yes [provider]  gabapentin (NEURONTIN) 100 MG capsule Take 200 mg 4 (four) times daily by mouth.   Yes [provider]  guaiFENesin (MUCINEX) 600 MG 12 hr tablet Take 600 mg by mouth 2 (two) times daily as needed for cough.   Yes [provider]  levothyroxine (SYNTHROID, LEVOTHROID) 50 MCG tablet Take 50 mcg by mouth  daily.     Yes [provider]  loperamide (IMODIUM) 2 MG capsule Take 2 mg by mouth 4 (four) times daily as needed. For loose stools/diarrhea    Yes [provider]  menthol-zinc oxide (GOLD BOND) powder Apply 1 application topically daily.   Yes [provider]  nystatin (MYCOSTATIN/NYSTOP) powder Apply 1 application topically 3 (three) times daily as needed (to affected skin).   Yes [provider]  OXYQUINOLONE SULFATE VAGINAL (TRIMO-SAN) 0.025 % GEL Place 1 application vaginally See admin instructions. On Fridays   Yes [provider]  polyethylene glycol (MIRALAX / GLYCOLAX) packet Take 17 g by mouth daily as needed. For constipation.    Yes [provider]  potassium chloride SA (K-DUR,KLOR-CON) 20 MEQ tablet Take 20 mEq by mouth daily.     Yes [provider]  Pramox-PE-Glycerin-Petrolatum (PREPARATION H) 1-0.25-14.4-15 % CREA Place 1 application rectally every 6 (six) hours as needed (hemorrhoids).   Yes [provider]  tamoxifen (NOLVADEX) 10 MG tablet Take 10 mg by mouth daily.     Yes [provider]  traMADol (ULTRAM) 50 MG tablet Take 1 tablet (50 mg total) by mouth every 8 (eight) hours as needed for moderate pain. Patient taking differently: Take 50 mg by mouth 4 (four) times daily.  05/02/17  Yes Medina-Vargas, Monina C, NP  trolamine salicylate (ASPERCREME) 10 % cream Apply 1 application topically every 8 (eight) hours as needed (low back pain).   Yes [provider]  cephALEXin (KEFLEX) 500 MG capsule Take 1 capsule (500 mg total) by mouth 4 (four) times daily. Patient not taking: Reported on 09/18/2019 07/12/19   Dorie Rank, MD  cephALEXin (KEFLEX) 500 MG capsule Take 1 capsule (500 mg total) by mouth 4 (four) times daily. Patient not taking: Reported on 09/18/2019 07/12/19   Dorie Rank, MD  HYDROcodone-acetaminophen (NORCO/VICODIN) 5-325 MG tablet Take 1 tablet by mouth every 8 (eight) hours as  needed. Patient not taking: Reported on 09/18/2019 01/06/19   Virgel Manifold, MD    Allergies    Sulfa antibiotics  Review of Systems   Review of Systems  All other systems reviewed and are negative.   Physical Exam Updated Vital Signs BP (!) 149/64    Pulse 60    Temp 98.4 F (36.9 C)    Resp 18    SpO2 93%   Physical Exam Vitals and nursing note reviewed.  Constitutional:      General: She is not in acute distress.    Appearance: She is well-developed.  HENT:     Head: Normocephalic and atraumatic.     Right Ear: External ear normal.     Left Ear: External ear normal.  Eyes:     General: No scleral icterus.  Right eye: No discharge.        Left eye: No discharge.     Conjunctiva/sclera: Conjunctivae normal.  Neck:     Trachea: No tracheal deviation.  Cardiovascular:     Rate and Rhythm: Normal rate and regular rhythm.  Pulmonary:     Effort: Pulmonary effort is normal. No respiratory distress.     Breath sounds: Normal breath sounds. No stridor. No wheezing or rales.  Chest:     Breasts:        Right: Tenderness present.      Comments: Patient has tenderness and induration in the upper outer aspect of her right breast, bluish-purple discoloration to the skin, inferiorly there is more brown-yellow discoloration, appears consistent with a hematoma Abdominal:     General: Bowel sounds are normal. There is no distension.     Palpations: Abdomen is soft.     Tenderness: There is no abdominal tenderness. There is no guarding or rebound.  Musculoskeletal:     Right shoulder: Tenderness present. Decreased range of motion.     Cervical back: Neck supple.     Comments: No bruising or swelling is noted around the right shoulder  Skin:    General: Skin is warm and dry.     Findings: No rash.  Neurological:     Mental Status: She is alert.     Cranial Nerves: No cranial nerve deficit (no facial droop, extraocular movements intact, no slurred speech).     Sensory: No  sensory deficit.     Motor: No abnormal muscle tone or seizure activity.     Coordination: Coordination normal.       ED Results / Procedures / Treatments   Labs (all labs ordered are listed, but only abnormal results are displayed) Labs Reviewed  BASIC METABOLIC PANEL - Abnormal; Notable for the following components:      Result Value   Glucose, Bld 104 (*)    All other components within normal limits  CBC WITH DIFFERENTIAL/PLATELET  PROTIME-INR    EKG None  Radiology DG Chest 2 View  Result Date: 09/18/2019 CLINICAL DATA:  Right breast pain EXAM: CHEST - 2 VIEW COMPARISON:  07/12/2019 FINDINGS: There is left lower lobe airspace disease. There is no pleural effusion or pneumothorax. The heart and mediastinal contours are unremarkable. Moderate osteoarthritis of bilateral shoulders. IMPRESSION: Left lower lobe airspace disease concerning for atelectasis versus pneumonia. Electronically Signed   By: Kathreen Devoid   On: 09/18/2019 14:05   DG Shoulder Right  Result Date: 09/18/2019 CLINICAL DATA:  Right shoulder pain.  No known injury. EXAM: RIGHT SHOULDER - 2+ VIEW COMPARISON:  Plain film of the chest 07/12/2019. CT chest 05/24/2017. FINDINGS: There is anterior subluxation of the humeral head on the glenoid. The patient has severe glenohumeral degenerative disease. The anterior glenoid appears remodeled and deficient. The articular surface of the humeral head is flattened and sclerotic. There is extensive sclerosis about the joint. Subacromial spurring also noted. The appearance is unchanged since the comparison plain film of the chest but worse than on the prior chest CT. IMPRESSION: Anterior subluxation of the right humeral head on the glenoid in a patient with severe glenohumeral degenerative change. The anterior glenoid and humeral head both appear remodeled in the anterior glenoid appears deficient. Electronically Signed   By: Inge Rise M.D.   On: 09/18/2019 14:06     Procedures Procedures (including critical care time)  Medications Ordered in ED Medications - No data to display  ED Course  I have reviewed the triage vital signs and the nursing notes.  Pertinent labs & imaging results that were available during my care of the patient were reviewed by me and considered in my medical decision making (see chart for details).  Clinical Course as of Sep 18 1598  Fri Sep 18, 2019  1530 CBC normal.  Metabolic panel normal.   [JK]  1531 Chest x-ray suggestive of atelectasis versus pneumonia.  Patient is not having fevers or chills and her complaints are right breast discoloration.  I do not think she has pneumonia   [JK]  1531 Shoulder shows severe shoulder arthritis and subluxation   [JK]    Clinical Course User Index [JK] Dorie Rank, MD   MDM Rules/Calculators/A&P                      Patient came to the ED with concerns of yellow discoloration to her breast.  Patient's laboratory tests are unremarkable.  Chest x-ray suggest atelectasis versus pneumonia but she is not have any symptoms to suggest pneumonia.  Shoulder x-ray does show severe arthritis but this is not a new finding for her.  I suspect the patient's hematoma is related to her Eliquis.  She is not anemic. I think the patient can be safely treated with symptomatic care.  Discussed outpatient follow-up with her primary care doctor. Final Clinical Impression(s) / ED Diagnoses Final diagnoses:  Hematoma of breast    Rx / DC Orders ED Discharge Orders    None       Dorie Rank, MD 09/18/19 1600

## 2019-09-18 NOTE — ED Notes (Signed)
Blood draw X2 unsuccessful °

## 2019-09-18 NOTE — Discharge Instructions (Addendum)
Try ice/cool packs for the first 24-48 hours to help with swelling.  You can try warm compresses after that for comfort.  Follow up with your doctor to make sure the bruising and swelling eventually goes away.   This may take a few weeks.

## 2019-12-04 ENCOUNTER — Other Ambulatory Visit: Payer: Self-pay

## 2019-12-04 ENCOUNTER — Inpatient Hospital Stay (HOSPITAL_COMMUNITY)
Admission: EM | Admit: 2019-12-04 | Discharge: 2019-12-11 | DRG: 871 | Disposition: A | Discharge status: Skilled Nursing Facility | Payer: Medicare Other | Source: Skilled Nursing Facility | Attending: Internal Medicine | Admitting: Internal Medicine

## 2019-12-04 ENCOUNTER — Emergency Department (HOSPITAL_COMMUNITY): Payer: Medicare Other

## 2019-12-04 ENCOUNTER — Encounter (HOSPITAL_COMMUNITY): Payer: Self-pay

## 2019-12-04 DIAGNOSIS — A4151 Sepsis due to Escherichia coli [E. coli]: Secondary | ICD-10-CM | POA: Diagnosis present

## 2019-12-04 DIAGNOSIS — Z6829 Body mass index (BMI) 29.0-29.9, adult: Secondary | ICD-10-CM | POA: Diagnosis not present

## 2019-12-04 DIAGNOSIS — Z66 Do not resuscitate: Secondary | ICD-10-CM | POA: Diagnosis present

## 2019-12-04 DIAGNOSIS — I480 Paroxysmal atrial fibrillation: Secondary | ICD-10-CM

## 2019-12-04 DIAGNOSIS — B9620 Unspecified Escherichia coli [E. coli] as the cause of diseases classified elsewhere: Secondary | ICD-10-CM | POA: Diagnosis not present

## 2019-12-04 DIAGNOSIS — E876 Hypokalemia: Secondary | ICD-10-CM | POA: Diagnosis not present

## 2019-12-04 DIAGNOSIS — R627 Adult failure to thrive: Secondary | ICD-10-CM | POA: Diagnosis present

## 2019-12-04 DIAGNOSIS — M797 Fibromyalgia: Secondary | ICD-10-CM | POA: Diagnosis present

## 2019-12-04 DIAGNOSIS — J181 Lobar pneumonia, unspecified organism: Secondary | ICD-10-CM | POA: Diagnosis not present

## 2019-12-04 DIAGNOSIS — Z79899 Other long term (current) drug therapy: Secondary | ICD-10-CM | POA: Diagnosis not present

## 2019-12-04 DIAGNOSIS — F0391 Unspecified dementia with behavioral disturbance: Secondary | ICD-10-CM | POA: Diagnosis present

## 2019-12-04 DIAGNOSIS — G8929 Other chronic pain: Secondary | ICD-10-CM | POA: Diagnosis present

## 2019-12-04 DIAGNOSIS — M419 Scoliosis, unspecified: Secondary | ICD-10-CM | POA: Diagnosis present

## 2019-12-04 DIAGNOSIS — C50919 Malignant neoplasm of unspecified site of unspecified female breast: Secondary | ICD-10-CM | POA: Diagnosis present

## 2019-12-04 DIAGNOSIS — E785 Hyperlipidemia, unspecified: Secondary | ICD-10-CM | POA: Diagnosis present

## 2019-12-04 DIAGNOSIS — A419 Sepsis, unspecified organism: Principal | ICD-10-CM

## 2019-12-04 DIAGNOSIS — R5381 Other malaise: Secondary | ICD-10-CM | POA: Diagnosis present

## 2019-12-04 DIAGNOSIS — Z20822 Contact with and (suspected) exposure to covid-19: Secondary | ICD-10-CM | POA: Diagnosis present

## 2019-12-04 DIAGNOSIS — J9601 Acute respiratory failure with hypoxia: Secondary | ICD-10-CM

## 2019-12-04 DIAGNOSIS — I10 Essential (primary) hypertension: Secondary | ICD-10-CM

## 2019-12-04 DIAGNOSIS — Z79890 Hormone replacement therapy (postmenopausal): Secondary | ICD-10-CM | POA: Diagnosis not present

## 2019-12-04 DIAGNOSIS — Z8249 Family history of ischemic heart disease and other diseases of the circulatory system: Secondary | ICD-10-CM | POA: Diagnosis not present

## 2019-12-04 DIAGNOSIS — Z7901 Long term (current) use of anticoagulants: Secondary | ICD-10-CM

## 2019-12-04 DIAGNOSIS — J189 Pneumonia, unspecified organism: Secondary | ICD-10-CM | POA: Diagnosis present

## 2019-12-04 DIAGNOSIS — Z7189 Other specified counseling: Secondary | ICD-10-CM | POA: Diagnosis not present

## 2019-12-04 DIAGNOSIS — Z515 Encounter for palliative care: Secondary | ICD-10-CM

## 2019-12-04 DIAGNOSIS — Z1501 Genetic susceptibility to malignant neoplasm of breast: Secondary | ICD-10-CM

## 2019-12-04 DIAGNOSIS — Z8616 Personal history of COVID-19: Secondary | ICD-10-CM | POA: Diagnosis not present

## 2019-12-04 DIAGNOSIS — R6520 Severe sepsis without septic shock: Secondary | ICD-10-CM

## 2019-12-04 DIAGNOSIS — Z9119 Patient's noncompliance with other medical treatment and regimen: Secondary | ICD-10-CM

## 2019-12-04 DIAGNOSIS — E039 Hypothyroidism, unspecified: Secondary | ICD-10-CM | POA: Diagnosis present

## 2019-12-04 DIAGNOSIS — Z79810 Long term (current) use of selective estrogen receptor modulators (SERMs): Secondary | ICD-10-CM | POA: Diagnosis not present

## 2019-12-04 DIAGNOSIS — Z87440 Personal history of urinary (tract) infections: Secondary | ICD-10-CM | POA: Diagnosis not present

## 2019-12-04 DIAGNOSIS — R7881 Bacteremia: Secondary | ICD-10-CM | POA: Diagnosis not present

## 2019-12-04 DIAGNOSIS — Z882 Allergy status to sulfonamides status: Secondary | ICD-10-CM

## 2019-12-04 LAB — CBC WITH DIFFERENTIAL/PLATELET
Abs Immature Granulocytes: 0.15 10*3/uL — ABNORMAL HIGH (ref 0.00–0.07)
Basophils Absolute: 0 10*3/uL (ref 0.0–0.1)
Basophils Relative: 0 %
Eosinophils Absolute: 0 10*3/uL (ref 0.0–0.5)
Eosinophils Relative: 0 %
HCT: 33.7 % — ABNORMAL LOW (ref 36.0–46.0)
Hemoglobin: 10.3 g/dL — ABNORMAL LOW (ref 12.0–15.0)
Immature Granulocytes: 1 %
Lymphocytes Relative: 3 %
Lymphs Abs: 0.6 10*3/uL — ABNORMAL LOW (ref 0.7–4.0)
MCH: 27.6 pg (ref 26.0–34.0)
MCHC: 30.6 g/dL (ref 30.0–36.0)
MCV: 90.3 fL (ref 80.0–100.0)
Monocytes Absolute: 1.7 10*3/uL — ABNORMAL HIGH (ref 0.1–1.0)
Monocytes Relative: 10 %
Neutro Abs: 14.6 10*3/uL — ABNORMAL HIGH (ref 1.7–7.7)
Neutrophils Relative %: 86 %
Platelets: 160 10*3/uL (ref 150–400)
RBC: 3.73 MIL/uL — ABNORMAL LOW (ref 3.87–5.11)
RDW: 15.1 % (ref 11.5–15.5)
WBC: 17 10*3/uL — ABNORMAL HIGH (ref 4.0–10.5)
nRBC: 0 % (ref 0.0–0.2)

## 2019-12-04 LAB — URINALYSIS, ROUTINE W REFLEX MICROSCOPIC
Bilirubin Urine: NEGATIVE
Glucose, UA: NEGATIVE mg/dL
Ketones, ur: NEGATIVE mg/dL
Nitrite: POSITIVE — AB
Protein, ur: 30 mg/dL — AB
Specific Gravity, Urine: 1.013 (ref 1.005–1.030)
pH: 5 (ref 5.0–8.0)

## 2019-12-04 LAB — COMPREHENSIVE METABOLIC PANEL
ALT: 10 U/L (ref 0–44)
AST: 13 U/L — ABNORMAL LOW (ref 15–41)
Albumin: 3.1 g/dL — ABNORMAL LOW (ref 3.5–5.0)
Alkaline Phosphatase: 35 U/L — ABNORMAL LOW (ref 38–126)
Anion gap: 12 (ref 5–15)
BUN: 25 mg/dL — ABNORMAL HIGH (ref 8–23)
CO2: 22 mmol/L (ref 22–32)
Calcium: 8.3 mg/dL — ABNORMAL LOW (ref 8.9–10.3)
Chloride: 104 mmol/L (ref 98–111)
Creatinine, Ser: 0.81 mg/dL (ref 0.44–1.00)
GFR calc Af Amer: >60 mL/min (ref >60)
GFR calc non Af Amer: >60 mL/min (ref >60)
Glucose, Bld: 219 mg/dL — ABNORMAL HIGH (ref 70–99)
Potassium: 3.5 mmol/L (ref 3.5–5.1)
Sodium: 138 mmol/L (ref 135–145)
Total Bilirubin: 0.7 mg/dL (ref 0.3–1.2)
Total Protein: 5.8 g/dL — ABNORMAL LOW (ref 6.5–8.1)

## 2019-12-04 LAB — PROTIME-INR
INR: 1.5 — ABNORMAL HIGH (ref 0.8–1.2)
Prothrombin Time: 17.9 seconds — ABNORMAL HIGH (ref 11.4–15.2)

## 2019-12-04 LAB — SARS CORONAVIRUS 2 BY RT PCR (HOSPITAL ORDER, PERFORMED IN ~~LOC~~ HOSPITAL LAB): SARS Coronavirus 2: NEGATIVE

## 2019-12-04 LAB — LACTIC ACID, PLASMA
Lactic Acid, Venous: 1.9 mmol/L (ref 0.5–1.9)
Lactic Acid, Venous: 1.4 mmol/L (ref 0.5–1.9)

## 2019-12-04 LAB — APTT: aPTT: 38 seconds — ABNORMAL HIGH (ref 24–36)

## 2019-12-04 MED ORDER — APIXABAN 2.5 MG PO TABS
2.5000 mg | ORAL_TABLET | Freq: Two times a day (BID) | ORAL | Status: DC
  Administered 2019-12-04 – 2019-12-11 (×14): 2.5 mg via ORAL
  Filled 2019-12-04 (×14): qty 1

## 2019-12-04 MED ORDER — SODIUM CHLORIDE 0.9 % IV SOLN
1000.0000 mL | INTRAVENOUS | Status: DC
  Administered 2019-12-04: 1000 mL via INTRAVENOUS

## 2019-12-04 MED ORDER — METRONIDAZOLE IN NACL 5-0.79 MG/ML-% IV SOLN
500.0000 mg | Freq: Once | INTRAVENOUS | Status: AC
  Administered 2019-12-04: 500 mg via INTRAVENOUS
  Filled 2019-12-04: qty 100

## 2019-12-04 MED ORDER — VANCOMYCIN HCL IN DEXTROSE 1-5 GM/200ML-% IV SOLN
1000.0000 mg | Freq: Once | INTRAVENOUS | Status: AC
  Administered 2019-12-04: 1000 mg via INTRAVENOUS
  Filled 2019-12-04: qty 200

## 2019-12-04 MED ORDER — TAMOXIFEN CITRATE 10 MG PO TABS
10.0000 mg | ORAL_TABLET | Freq: Every day | ORAL | Status: DC
  Administered 2019-12-05 – 2019-12-11 (×7): 10 mg via ORAL
  Filled 2019-12-04 (×7): qty 1

## 2019-12-04 MED ORDER — SODIUM CHLORIDE 0.9 % IV SOLN
2.0000 g | Freq: Once | INTRAVENOUS | Status: AC
  Administered 2019-12-04: 2 g via INTRAVENOUS
  Filled 2019-12-04: qty 2

## 2019-12-04 MED ORDER — AMIODARONE HCL 100 MG PO TABS
100.0000 mg | ORAL_TABLET | Freq: Every day | ORAL | Status: DC
  Administered 2019-12-05 – 2019-12-11 (×7): 100 mg via ORAL
  Filled 2019-12-04 (×7): qty 1

## 2019-12-04 MED ORDER — SODIUM CHLORIDE 0.9 % IV BOLUS (SEPSIS)
1000.0000 mL | Freq: Once | INTRAVENOUS | Status: AC
  Administered 2019-12-04: 1000 mL via INTRAVENOUS

## 2019-12-04 MED ORDER — SODIUM CHLORIDE 0.9 % IV SOLN: 2.0000 g | Freq: Two times a day (BID) | INTRAVENOUS | Status: DC

## 2019-12-04 MED ORDER — BACLOFEN 10 MG PO TABS
10.0000 mg | ORAL_TABLET | Freq: Three times a day (TID) | ORAL | Status: DC
  Administered 2019-12-04 – 2019-12-11 (×21): 10 mg via ORAL
  Filled 2019-12-04 (×21): qty 1

## 2019-12-04 MED ORDER — SODIUM CHLORIDE 0.9 % IV SOLN
500.0000 mg | INTRAVENOUS | Status: DC
  Administered 2019-12-04 – 2019-12-06 (×3): 500 mg via INTRAVENOUS
  Filled 2019-12-04 (×4): qty 500

## 2019-12-04 MED ORDER — SODIUM CHLORIDE 0.9 % IV SOLN
2.0000 g | INTRAVENOUS | Status: DC
  Administered 2019-12-05 – 2019-12-07 (×3): 2 g via INTRAVENOUS
  Filled 2019-12-04 (×2): qty 2
  Filled 2019-12-04: qty 20

## 2019-12-04 MED ORDER — ACETAMINOPHEN 325 MG PO TABS
650.0000 mg | ORAL_TABLET | Freq: Four times a day (QID) | ORAL | Status: DC | PRN
  Administered 2019-12-05 – 2019-12-09 (×7): 650 mg via ORAL
  Filled 2019-12-04 (×7): qty 2

## 2019-12-04 MED ORDER — VANCOMYCIN HCL 750 MG/150ML IV SOLN: 750.0000 mg | INTRAVENOUS | Status: DC

## 2019-12-04 MED ORDER — LEVOTHYROXINE SODIUM 50 MCG PO TABS
50.0000 ug | ORAL_TABLET | Freq: Every day | ORAL | Status: DC
  Administered 2019-12-05 – 2019-12-11 (×7): 50 ug via ORAL
  Filled 2019-12-04 (×7): qty 1

## 2019-12-04 MED ORDER — GABAPENTIN 100 MG PO CAPS
200.0000 mg | ORAL_CAPSULE | Freq: Two times a day (BID) | ORAL | Status: DC
  Administered 2019-12-04 – 2019-12-11 (×14): 200 mg via ORAL
  Filled 2019-12-04 (×14): qty 2

## 2019-12-04 NOTE — ED Provider Notes (Signed)
[facility] EMERGENCY DEPARTMENT Provider Note   CSN: [id number] Arrival date & time: 12/04/19  1744     History Chief Complaint  Patient presents with  . Fever  . Shortness of Breath    [name] is a 86 y.o. female with a past medical history of A. fib, depression, fibromyalgia, hypokalemia, hyperlipidemia and hypertension.  History is given by the patient and her son by phone.  According to her son she has a history of early dementia, with behavioral disturbance and chronic depression.  She has had multiple recurrent urinary tract infections recently.  He was told that she was brought to the emergency department for shortness of breath and fever.  He is unable to provide other history other than he is her healthcare power of attorney and have both discussed end-of-life care.  Patient and her son both confirm DNR/DNI status.  The patient states she has had several days of cough.  She is she is coughing up phlegm.  Today she became febrile and arrived with hypotension and fever of 103.  Code sepsis initiated.  Her son reports that she previously had Covid he is not sure if she has been vaccinated.  The patient does report vaccination but is unable to give me the dates.  She denies any current urinary symptoms, belly pain, nausea, vomiting.   HPI     Past Medical History:  Diagnosis Date  . Anxiety   . Atrial fibrillation ([place])   . Breast CA ([place])   . Constipation   . Depression   . Disorder of bone density and structure, unspecified   . Fibromyalgia   . Hyperkalemia   . Hyperlipemia   . Hypertension   . Impaired fasting glucose   . Retention of urine, unspecified   . Scoliosis   . Thyroid disease     Patient Active Problem List   Diagnosis Date Noted  . Educated about COVID-19 virus infection 05/16/2019  . Non-allergic rhinitis 05/01/2017  . Pressure injury of skin 04/23/2017  . Aspiration pneumonia ([place]) 04/22/2017  . Acute hypoxemic respiratory  failure ([place]) 04/22/2017  . HCAP (healthcare-associated pneumonia) 03/27/2017  . Respiratory failure with hypoxia ([place]) 11/02/2015  . Depression 11/02/2015  . Anxiety 11/02/2015  . Hypertension 11/02/2015  . Paroxysmal atrial fibrillation ([place]) 11/02/2015  . Hypothyroidism 11/02/2015  . Chronic venous stasis dermatitis 11/02/2015  . Chronic pain 11/02/2015  . Breast cancer ([place]) 11/02/2015    Past Surgical History:  Procedure Laterality Date  . HIP FRACTURE SURGERY       OB History   No obstetric history on file.     Family History  Problem Relation Age of Onset  . Hypertension Other     Social History   Tobacco Use  . Smoking status: Never Smoker  . Smokeless tobacco: Never Used  Substance Use Topics  . Alcohol use: No  . Drug use: No    Home Medications Prior to Admission medications   Medication Sig Start Date End Date Taking? Authorizing Provider  acetaminophen (TYLENOL) 500 MG tablet Take 500 mg by mouth every 4 (four) hours.    [provider]  amiodarone (PACERONE) 100 MG tablet Take 1 tablet (100 mg total) daily by mouth. 03/29/17   Barton Dubois, MD  amLODipine (NORVASC) 5 MG tablet Take 5 mg by mouth daily.    [provider]  apixaban (ELIQUIS) 2.5 MG TABS tablet Take 1 tablet (2.5 mg total) by mouth 2 (two) times daily.  07/09/19   Minus Breeding, MD  baclofen (LIORESAL) 10 MG tablet Take 1 tablet (10 mg total) by mouth 3 (three) times daily. 04/25/17   Patrecia Pour, MD  barrier cream (NON-SPECIFIED) CREA Apply 1 application topically 2 (two) times daily as needed (for redness to bottom).    [provider]  benazepril (LOTENSIN) 40 MG tablet Take 40 mg by mouth daily.    [provider]  carboxymethylcellulose (REFRESH PLUS) 0.5 % SOLN Place 1 drop into both eyes daily as needed (dry eyes).     [provider]  cephALEXin (KEFLEX) 500 MG capsule Take 1 capsule (500 mg total) by mouth 4 (four) times  daily. Patient not taking: Reported on 09/18/2019 07/12/19   Dorie Rank, MD  cephALEXin (KEFLEX) 500 MG capsule Take 1 capsule (500 mg total) by mouth 4 (four) times daily. Patient not taking: Reported on 09/18/2019 07/12/19   Dorie Rank, MD  furosemide (LASIX) 80 MG tablet Take 80 mg by mouth every morning.      [provider]  gabapentin (NEURONTIN) 100 MG capsule Take 200 mg 4 (four) times daily by mouth.    [provider]  guaiFENesin (MUCINEX) 600 MG 12 hr tablet Take 600 mg by mouth 2 (two) times daily as needed for cough.    [provider]  HYDROcodone-acetaminophen (NORCO/VICODIN) 5-325 MG tablet Take 1 tablet by mouth every 8 (eight) hours as needed. Patient not taking: Reported on 09/18/2019 01/06/19   Virgel Manifold, MD  levothyroxine (SYNTHROID, LEVOTHROID) 50 MCG tablet Take 50 mcg by mouth daily.      [provider]  loperamide (IMODIUM) 2 MG capsule Take 2 mg by mouth 4 (four) times daily as needed. For loose stools/diarrhea     [provider]  menthol-zinc oxide (GOLD BOND) powder Apply 1 application topically daily.    [provider]  nystatin (MYCOSTATIN/NYSTOP) powder Apply 1 application topically 3 (three) times daily as needed (to affected skin).    [provider]  OXYQUINOLONE SULFATE VAGINAL (TRIMO-SAN) 0.025 % GEL Place 1 application vaginally See admin instructions. On Fridays    [provider]  polyethylene glycol (MIRALAX / GLYCOLAX) packet Take 17 g by mouth daily as needed. For constipation.     [provider]  potassium chloride SA (K-DUR,KLOR-CON) 20 MEQ tablet Take 20 mEq by mouth daily.      [provider]  Pramox-PE-Glycerin-Petrolatum (PREPARATION H) 1-0.25-14.4-15 % CREA Place 1 application rectally every 6 (six) hours as needed (hemorrhoids).    [provider]  tamoxifen (NOLVADEX) 10 MG tablet Take 10 mg by mouth daily.      [provider]  traMADol  (ULTRAM) 50 MG tablet Take 1 tablet (50 mg total) by mouth every 8 (eight) hours as needed for moderate pain. Patient taking differently: Take 50 mg by mouth 4 (four) times daily.  05/02/17   Medina-Vargas, Monina C, NP  trolamine salicylate (ASPERCREME) 10 % cream Apply 1 application topically every 8 (eight) hours as needed (low back pain).    [provider]    Allergies    Sulfa antibiotics  Review of Systems   Review of Systems Ten systems reviewed and are negative for acute change, except as noted in the HPI.   Physical Exam Updated Vital Signs BP (!) 99/40 (BP Location: Left Arm)   Pulse 65   Temp (!) 103 F (39.4 C) (Oral)   Resp 22   SpO2 96%   Physical  Exam Vitals and nursing note reviewed.  Constitutional:      General: She is not in acute distress.    Appearance: She is well-developed. She is ill-appearing, toxic-appearing and diaphoretic.  HENT:     Head: Normocephalic and atraumatic.  Eyes:     General: No scleral icterus.    Conjunctiva/sclera: Conjunctivae normal.  Cardiovascular:     Rate and Rhythm: Normal rate and regular rhythm.     Heart sounds: Murmur heard.  No friction rub. No gallop.   Pulmonary:     Effort: Pulmonary effort is normal. Tachypnea present. No respiratory distress.     Breath sounds: Decreased breath sounds present.  Abdominal:     General: Bowel sounds are normal. There is no distension.     Palpations: Abdomen is soft. There is no mass.     Tenderness: There is no abdominal tenderness. There is no guarding.  Musculoskeletal:     Cervical back: Normal range of motion.     Right lower leg: Edema present.     Left lower leg: Edema present.  Skin:    General: Skin is warm.  Neurological:     Mental Status: She is alert and oriented to person, place, and time.  Psychiatric:        Behavior: Behavior normal.     ED Results / Procedures / Treatments   Labs (all labs ordered are listed, but only abnormal results are  displayed) Labs Reviewed  COMPREHENSIVE METABOLIC PANEL - Abnormal; Notable for the following components:      Result Value   Glucose, Bld 219 (*)    BUN 25 (*)    Calcium 8.3 (*)    Total Protein 5.8 (*)    Albumin 3.1 (*)    AST 13 (*)    Alkaline Phosphatase 35 (*)    All other components within normal limits  CBC WITH DIFFERENTIAL/PLATELET - Abnormal; Notable for the following components:   WBC 17.0 (*)    RBC 3.73 (*)    Hemoglobin 10.3 (*)    HCT 33.7 (*)    Neutro Abs 14.6 (*)    Lymphs Abs 0.6 (*)    Monocytes Absolute 1.7 (*)    Abs Immature Granulocytes 0.15 (*)    All other components within normal limits  APTT - Abnormal; Notable for the following components:   aPTT 38 (*)    All other components within normal limits  PROTIME-INR - Abnormal; Notable for the following components:   Prothrombin Time 17.9 (*)    INR 1.5 (*)    All other components within normal limits  CULTURE, BLOOD (ROUTINE X 2)  CULTURE, BLOOD (ROUTINE X 2)  URINE CULTURE  SARS CORONAVIRUS 2 BY RT PCR (HOSPITAL ORDER, [facility])  LACTIC ACID, PLASMA  LACTIC ACID, PLASMA  URINALYSIS, ROUTINE W REFLEX MICROSCOPIC    EKG None  Radiology DG Chest Port 1 View  Result Date: 12/04/2019 CLINICAL DATA:  Shortness of breath, fever EXAM: PORTABLE CHEST 1 VIEW COMPARISON:  Radiograph 09/18/2019 FINDINGS: Suboptimal projection due to patient difficulty with positioning. Slight left anterior obliquity. Streaky opacities in the right lung base, favor atelectasis though early infection is not excluded in the setting of fever. Remaining portions the lungs are clear. Stable cardiomediastinal contours accounting for differences in technique with mild cardiomegaly and a calcified aorta. No pneumothorax or visible effusion though portion of the left costophrenic sulcus is collimated. Telemetry leads overlie the chest. No acute osseous or  soft tissue abnormality. IMPRESSION: 1. Suboptimal  projection due to patient difficulty with positioning. 2. Streaky opacities in the right lung base, favor atelectasis though early infection is not excluded in the setting of fever. 3. Stable cardiomegaly. 4.  Aortic Atherosclerosis (ICD10-I70.0). Electronically Signed   By: Lovena Le M.D.   On: 12/04/2019 18:53    Procedures .Critical Care Performed by: Margarita Mail, PA-C Authorized by: Margarita Mail, PA-C   Critical care provider statement:    Critical care time (minutes):  60   Critical care time was exclusive of:  Separately billable procedures and treating other patients   Critical care was necessary to treat or prevent imminent or life-threatening deterioration of the following conditions:  Sepsis   Critical care was time spent personally by me on the following activities:  Discussions with consultants, evaluation of patient's response to treatment, examination of patient, ordering and performing treatments and interventions, ordering and review of laboratory studies, ordering and review of radiographic studies, pulse oximetry, re-evaluation of patient's condition, obtaining history from patient or surrogate and review of old charts   (including critical care time)  Medications Ordered in ED Medications  metroNIDAZOLE (FLAGYL) IVPB 500 mg (500 mg Intravenous New Bag/Given 12/04/19 1901)  vancomycin (VANCOCIN) IVPB 1000 mg/200 mL premix (has no administration in time range)  0.9 %  sodium chloride infusion (1,000 mLs Intravenous New Bag/Given 12/04/19 1852)  ceFEPIme (MAXIPIME) 2 g in sodium chloride 0.9 % 100 mL IVPB (2 g Intravenous New Bag/Given 12/04/19 1853)    ED Course  I have reviewed the triage vital signs and the nursing notes.  Pertinent labs & imaging results that were available during my care of the patient were reviewed by me and considered in my medical decision making (see chart for details).  Clinical Course as of Dec 03 1940  Fri Dec 04, 2019  1833 Hx of  recurrent uti- treated with abx Mild memory issue, early behavior issues Son [name] [phone] HCPOA He states that patient is DNR/DNI   [AH]  1923 WBC(!): 17.0 [AH]    Clinical Course User Index [AH] Margarita Mail, PA-C   MDM Rules/Calculators/A&P                          CX:KGYJE, Fever VS: BP (!) 150/75 (BP Location: Left Arm)   Pulse 81   Temp 98.6 F (37 C) (Axillary)   Resp (!) 26   Ht 4\' 9"  (1.448 m)   Wt 61.7 kg   SpO2 94%   BMI 29.44 kg/m   HU:DJSHFWY is gathered by patient  and Son by phone. Previous records obtained and reviewed. DDX:The patient's complaint of cough/fever involves an extensive number of diagnostic and treatment options, and is a complaint that carries with it a high risk of complications, morbidity, and potential mortality. Given the large differential diagnosis, medical decision making is of high complexity. Differential diagnosis for emergent cause of cough includes but is not limited to upper respiratory infection, lower respiratory infection, allergies, asthma, irritants, foreign body, medications such as ACE inhibitors, reflux, asthma, CHF, lung cancer, interstitial lung disease, psychiatric causes, postnasal drip, SEPSIS, COVID-19 and postinfectious bronchospasm.  Labs: I ordered reviewed and interpreted labs which include CBC- [place] of [address], anemia Lactate wnl Covid/ UA pending  CMP shows hyperglycemia    Imaging: I ordered and reviewed images which included CXR. I independently visualized and interpreted all imaging. Significant findings include infiltrate, right lung base .  WUG:[id number] rhythm at a rate of 62 Consults: UEK:CMKLKJZ is critically ill with cough, fever and presumed sepsis. Lacic acid wnl, pressures are soft getting borad spectrum abx and 30 cc /kg fluids.  Patient disposition:admit  The patient appears reasonably stabilized for admission considering the current resources, flow, and capabilities available  in the ED at this time, and I doubt any other [facility] requiring further screening and/or treatment in the ED prior to admission.    [name] was evaluated in Emergency Department on 12/05/2019 for the symptoms described in the history of present illness. She was evaluated in the context of the global COVID-19 pandemic, which necessitated consideration that the patient might be at risk for infection with the SARS-CoV-2 virus that causes COVID-19. Institutional protocols and algorithms that pertain to the evaluation of patients at risk for COVID-19 are in a state of rapid change based on information released by regulatory bodies including the CDC and federal and state organizations. These policies and algorithms were followed during the patient's care in the ED.     Final Clinical Impression(s) / ED Diagnoses Final diagnoses:  None    Rx / DC Orders ED Discharge Orders    None       Margarita Mail, PA-C 12/05/19 1322    Elnora Morrison, MD 12/05/19 2326

## 2019-12-04 NOTE — Progress Notes (Signed)
Pharmacy Antibiotic Note  [name] is a 86 y.o. female admitted on 12/04/2019 with sepsis.  Pharmacy has been consulted for vancomycin and cefepime dosing.  Plan: Vancomycin 1000 mg Iv x 1, then 750 mg IV every 24 hours (target vancomycin trough 15-20) Cefepime 2g IV every 12 hours Monitor renal function, Cx and clinical progression to narrow Vancomycin trough at steady state     Temp (24hrs), Avg:103 F (39.4 C), Min:103 F (39.4 C), Max:103 F (39.4 C)  Recent Labs  Lab 12/04/19 1802  WBC 17.0*    CrCl cannot be calculated (Patient's most recent lab result is older than the maximum 21 days allowed.).    Allergies  Allergen Reactions  . Sulfa Antibiotics Other (See Comments)    Per mar    Antimicrobials this admission: Vanc 7/23>> Cefepime 7/23>>  Dose adjustments this admission: n/a  Microbiology results: 7/23 BCx: sent 7/23 UCx: sent   Bertis Ruddy, PharmD Clinical Pharmacist ED Pharmacist Phone # [phone] 12/04/2019 7:13 PM

## 2019-12-04 NOTE — ED Triage Notes (Signed)
Pt arrived via EMS from [facility] for fever and sob requiring 4l of oxygen via nasal cannula

## 2019-12-04 NOTE — H&P (Signed)
History and Physical    [name] UDJ:[id number] DOB: 1930-06-01 DOA: 12/04/2019  PCP: [name], MD  Patient coming from: [facility]  I have personally briefly reviewed patient's old medical records in [place]  Chief Complaint: Fever, SOB  HPI: [name] is a 86 y.o. female with medical history significant of A.Fib on eliquis, HTN, breast CA on tamoxifen, early dementia.  Pt presents to ED with c/o cough productive of greenish sputum, fever, SOB.  Symptoms onset several days ago, worsening.  Pt has had multiple UTIs recently.  Pt has had COVID and COVID vaccine reportedly.   ED Course: Tm 103 on arrival, BP 79/36 initially, improved to 99/48 with IVF.  WBC 17k.  New 4L O2 requirement.  CXR shows streaky opacity in R base: actelectasis vs early infiltrate.   Review of Systems: As per HPI, otherwise all review of systems negative.  Past Medical History:  Diagnosis Date  . Anxiety   . Atrial fibrillation ([place])   . Breast CA ([place])   . Constipation   . Depression   . Disorder of bone density and structure, unspecified   . Fibromyalgia   . Hyperkalemia   . Hyperlipemia   . Hypertension   . Impaired fasting glucose   . Retention of urine, unspecified   . Scoliosis   . Thyroid disease     Past Surgical History:  Procedure Laterality Date  . HIP FRACTURE SURGERY       reports that she has never smoked. She has never used smokeless tobacco. She reports that she does not drink alcohol and does not use drugs.  Allergies  Allergen Reactions  . Sulfa Antibiotics Other (See Comments)    Per mar    Family History  Problem Relation Age of Onset  . Hypertension Other      Prior to Admission medications   Medication Sig Start Date End Date Taking? Authorizing Provider  acetaminophen (TYLENOL) 500 MG tablet Take 500 mg by mouth every 4 (four) hours.   Yes [provider]  amiodarone (PACERONE) 100 MG tablet Take 1 tablet (100  mg total) daily by mouth. 03/29/17  Yes Barton Dubois, MD  amLODipine (NORVASC) 5 MG tablet Take 5 mg by mouth daily.   Yes [provider]  apixaban (ELIQUIS) 2.5 MG TABS tablet Take 1 tablet (2.5 mg total) by mouth 2 (two) times daily. 07/09/19  Yes Minus Breeding, MD  baclofen (LIORESAL) 10 MG tablet Take 1 tablet (10 mg total) by mouth 3 (three) times daily. 04/25/17  Yes Patrecia Pour, MD  benazepril (LOTENSIN) 40 MG tablet Take 40 mg by mouth daily.   Yes [provider]  furosemide (LASIX) 80 MG tablet Take 80 mg by mouth every morning.     Yes [provider]  gabapentin (NEURONTIN) 100 MG capsule Take 200 mg by mouth 2 (two) times daily.    Yes [provider]  levothyroxine (SYNTHROID, LEVOTHROID) 50 MCG tablet Take 50 mcg by mouth daily.     Yes [provider]  loperamide (IMODIUM) 2 MG capsule Take 2 mg by mouth 4 (four) times daily as needed. For loose stools/diarrhea    Yes [provider]  menthol-zinc oxide (GOLD BOND) powder Apply 1 application topically daily.   Yes [provider]  OXYQUINOLONE SULFATE VAGINAL (TRIMO-SAN) 0.025 % GEL Place 1 application vaginally See admin instructions. On Fridays   Yes [provider]  potassium chloride SA (K-DUR,KLOR-CON) 20 MEQ tablet  Take 20 mEq by mouth daily.     Yes [provider]  tamoxifen (NOLVADEX) 10 MG tablet Take 10 mg by mouth daily.     Yes [provider]    Physical Exam: Vitals:   12/04/19 2100 12/04/19 2115 12/04/19 2130 12/04/19 2131  BP: (!) 105/55 (!) 97/46 (!) 99/48   Pulse: 55 55 55   Resp: 16 17 19    Temp:      TempSrc:      SpO2:   97% 97%    Constitutional: Ill appearing Eyes: PERRL, lids and conjunctivae normal ENMT: Mucous membranes are moist. Posterior pharynx clear of any exudate or lesions.Normal dentition.  Neck: normal, supple, no masses, no thyromegaly Respiratory: clear to auscultation bilaterally, no  wheezing, no crackles. Normal respiratory effort. No accessory muscle use.  Cardiovascular: Regular rate and rhythm, no murmurs / rubs / gallops. No extremity edema. 2+ pedal pulses. No carotid bruits.  Abdomen: no tenderness, no masses palpated. No hepatosplenomegaly. Bowel sounds positive.  Musculoskeletal: no clubbing / cyanosis. No joint deformity upper and lower extremities. Good ROM, no contractures. Normal muscle tone.  Skin: no rashes, lesions, ulcers. No induration Neurologic: CN 2-12 grossly intact. Sensation intact, DTR normal. Strength 5/5 in all 4.  Psychiatric: Normal judgment and insight. Alert and oriented x 3. Normal mood.    Labs on Admission: I have personally reviewed following labs and imaging studies  CBC: Recent Labs  Lab 12/04/19 1802  WBC 17.0*  NEUTROABS 14.6*  HGB 10.3*  HCT 33.7*  MCV 90.3  PLT 384   Basic Metabolic Panel: Recent Labs  Lab 12/04/19 1802  NA 138  K 3.5  CL 104  CO2 22  GLUCOSE 219*  BUN 25*  CREATININE 0.81  CALCIUM 8.3*   GFR: CrCl cannot be calculated (Unknown ideal weight.). Liver Function Tests: Recent Labs  Lab 12/04/19 1802  AST 13*  ALT 10  ALKPHOS 35*  BILITOT 0.7  PROT 5.8*  ALBUMIN 3.1*   No results for input(s): LIPASE, AMYLASE in the last 168 hours. No results for input(s): AMMONIA in the last 168 hours. Coagulation Profile: Recent Labs  Lab 12/04/19 1802  INR 1.5*   Cardiac Enzymes: No results for input(s): CKTOTAL, CKMB, CKMBINDEX, TROPONINI in the last 168 hours. BNP (last 3 results) No results for input(s): PROBNP in the last 8760 hours. HbA1C: No results for input(s): HGBA1C in the last 72 hours. CBG: No results for input(s): GLUCAP in the last 168 hours. Lipid Profile: No results for input(s): CHOL, HDL, LDLCALC, TRIG, CHOLHDL, LDLDIRECT in the last 72 hours. Thyroid Function Tests: No results for input(s): TSH, T4TOTAL, FREET4, T3FREE, THYROIDAB in the last 72 hours. Anemia Panel: No  results for input(s): VITAMINB12, FOLATE, FERRITIN, TIBC, IRON, RETICCTPCT in the last 72 hours. Urine analysis:    Component Value Date/Time   COLORURINE YELLOW 07/12/2019 1420   APPEARANCEUR HAZY (A) 07/12/2019 1420   LABSPEC 1.006 07/12/2019 1420   PHURINE 5.0 07/12/2019 1420   GLUCOSEU NEGATIVE 07/12/2019 1420   HGBUR SMALL (A) 07/12/2019 1420   BILIRUBINUR NEGATIVE 07/12/2019 1420   KETONESUR NEGATIVE 07/12/2019 1420   PROTEINUR NEGATIVE 07/12/2019 1420   UROBILINOGEN 1.0 03/07/2011 0044   NITRITE NEGATIVE 07/12/2019 1420   LEUKOCYTESUR MODERATE (A) 07/12/2019 1420    Radiological Exams on Admission: DG Chest Port 1 View  Result Date: 12/04/2019 CLINICAL DATA:  Shortness of breath, fever EXAM: PORTABLE CHEST 1 VIEW COMPARISON:  Radiograph 09/18/2019 FINDINGS: Suboptimal projection due to  patient difficulty with positioning. Slight left anterior obliquity. Streaky opacities in the right lung base, favor atelectasis though early infection is not excluded in the setting of fever. Remaining portions the lungs are clear. Stable cardiomediastinal contours accounting for differences in technique with mild cardiomegaly and a calcified aorta. No pneumothorax or visible effusion though portion of the left costophrenic sulcus is collimated. Telemetry leads overlie the chest. No acute osseous or soft tissue abnormality. IMPRESSION: 1. Suboptimal projection due to patient difficulty with positioning. 2. Streaky opacities in the right lung base, favor atelectasis though early infection is not excluded in the setting of fever. 3. Stable cardiomegaly. 4.  Aortic Atherosclerosis (ICD10-I70.0). Electronically Signed   By: Lovena Le M.D.   On: 12/04/2019 18:53    EKG: Independently reviewed.  Assessment/Plan Principal Problem:   Sepsis due to pneumonia [facility]) Active Problems:   Hypertension   Paroxysmal atrial fibrillation (HCC)   Breast cancer (HCC)   Right lower lobe pneumonia   Acute  hypoxemic respiratory failure ([place])    1. Sepsis, likely due to PNA - with acute hypoxic resp failure 1. UA pending, but given HPI, resp failure, cough, etc feel PNA is most likely diagnosis. 2. COVID pending, but given WBC, productive cough, h/o COVID and COVID vaccine: bacterial PNA seems more likely. 3. PNA pathway 4. Got cefepime and vanc in ED 5. Will put pt on rocephin + azithromycin 6. BCx pending 7. Lactates 1.9 and 1.4. 8. IVF: 2L bolus NS in ED then NS at 125 cc/hr 2. PAF - 1. Tele monitor 2. Cont amiodarone 3. Cont eliquis 3. H/o BRCA - cont tamoxifen 4. HTN - 1. Holding all home BP meds due to sepsis associated hypotension on presentation 2. Holding lasix  DVT prophylaxis: Eliquis Code Status: DNR/DNI - per pt and son. Family Communication: No family in room, EDP spoke with son. Disposition Plan: Back to [facility] when PNA / sepsis treated Consults called: None Admission status: Admit to inpatient  Severity of Illness: The appropriate patient status for this patient is INPATIENT. Inpatient status is judged to be reasonable and necessary in order to provide the required intensity of service to ensure the patient's safety. The patient's presenting symptoms, physical exam findings, and initial radiographic and laboratory data in the context of their chronic comorbidities is felt to place them at high risk for further clinical deterioration. Furthermore, it is not anticipated that the patient will be medically stable for discharge from the hospital within 2 midnights of admission. The following factors support the patient status of inpatient.   IP status due to PNA with new O2 requirement and sepsis.  PSI score is 116 (at least) meaning she is at least risk class 4.  * I certify that at the point of admission it is my clinical judgment that the patient will require inpatient hospital care spanning beyond 2 midnights from the point of admission due to high intensity of  service, high risk for further deterioration and high frequency of surveillance required.*    Taira Knabe M. DO [facility]  How to contact the [facility] Attending or Consulting provider [place] or covering provider during after hours [place], for this patient?  1. Check the care team in [facility] and look for a) attending/consulting [facility] provider listed and b) the [facility] team listed 2. Log into [email or web address]  Amion Physician Scheduling and messaging for groups and whole hospitals  On call and physician scheduling software for group practices, residents, hospitalists and other  medical providers for call, clinic, rotation and shift schedules. On[employer] is a hospital-wide system for scheduling doctors and paging doctors on call. EasyPlot is for scientific plotting and data analysis.  [email or web address]  and use [place]'s universal password to access. If you do not have the password, please contact the hospital operator.  3. Locate the [facility] provider you are looking for under [facility] and page to a number that you can be directly reached. 4. If you still have difficulty reaching the provider, please page the [facility] (Director on Call) for the Hospitalists listed on amion for assistance.  12/04/2019, 9:44 PM

## 2019-12-04 NOTE — ED Notes (Signed)
Consult has be paged, it will not click off

## 2019-12-04 NOTE — ED Provider Notes (Signed)
Shared service with APP.  I have personally seen and examined the patient, providing direct face to face care.  Physical exam findings and plan include sepsis eval with hypoxia, fever, elderly.  Pt bp 80s on recheck.  Broad abx IV, code sepsis initiated. BP improved with fluids boluses. DNR.  Mild rales bilateral.    1. Sepsis, due to unspecified organism, unspecified whether acute organ dysfunction present ([place])     .Critical Care Performed by: Elnora Morrison, MD Authorized by: Elnora Morrison, MD   Critical care provider statement:    Critical care time (minutes):  40   Critical care start time:  12/04/2019 8:00 PM   Critical care end time:  12/04/2019 8:40 PM   Critical care time was exclusive of:  Separately billable procedures and treating other patients and teaching time   Critical care was necessary to treat or prevent imminent or life-threatening deterioration of the following conditions:  Sepsis   Critical care was time spent personally by me on the following activities:  Evaluation of patient's response to treatment, examination of patient, ordering and performing treatments and interventions, ordering and review of laboratory studies, ordering and review of radiographic studies, pulse oximetry and re-evaluation of patient's condition   Sepsis Hypoxia   Elnora Morrison, MD 12/05/19 0022

## 2019-12-05 DIAGNOSIS — J9601 Acute respiratory failure with hypoxia: Secondary | ICD-10-CM | POA: Diagnosis not present

## 2019-12-05 DIAGNOSIS — J181 Lobar pneumonia, unspecified organism: Secondary | ICD-10-CM

## 2019-12-05 DIAGNOSIS — I480 Paroxysmal atrial fibrillation: Secondary | ICD-10-CM | POA: Diagnosis not present

## 2019-12-05 DIAGNOSIS — B9620 Unspecified Escherichia coli [E. coli] as the cause of diseases classified elsewhere: Secondary | ICD-10-CM

## 2019-12-05 DIAGNOSIS — A419 Sepsis, unspecified organism: Secondary | ICD-10-CM | POA: Diagnosis not present

## 2019-12-05 DIAGNOSIS — R7881 Bacteremia: Secondary | ICD-10-CM

## 2019-12-05 DIAGNOSIS — J189 Pneumonia, unspecified organism: Secondary | ICD-10-CM | POA: Diagnosis not present

## 2019-12-05 LAB — CBC
HCT: 36.1 % (ref 36.0–46.0)
Hemoglobin: 10.9 g/dL — ABNORMAL LOW (ref 12.0–15.0)
MCH: 27.7 pg (ref 26.0–34.0)
MCHC: 30.2 g/dL (ref 30.0–36.0)
MCV: 91.9 fL (ref 80.0–100.0)
Platelets: 141 10*3/uL — ABNORMAL LOW (ref 150–400)
RBC: 3.93 MIL/uL (ref 3.87–5.11)
RDW: 15.3 % (ref 11.5–15.5)
WBC: 15.6 10*3/uL — ABNORMAL HIGH (ref 4.0–10.5)
nRBC: 0 % (ref 0.0–0.2)

## 2019-12-05 LAB — BASIC METABOLIC PANEL
Anion gap: 12 (ref 5–15)
BUN: 20 mg/dL (ref 8–23)
CO2: 20 mmol/L — ABNORMAL LOW (ref 22–32)
Calcium: 7.9 mg/dL — ABNORMAL LOW (ref 8.9–10.3)
Chloride: 111 mmol/L (ref 98–111)
Creatinine, Ser: 0.74 mg/dL (ref 0.44–1.00)
GFR calc Af Amer: >60 mL/min (ref >60)
GFR calc non Af Amer: >60 mL/min (ref >60)
Glucose, Bld: 124 mg/dL — ABNORMAL HIGH (ref 70–99)
Potassium: 3.7 mmol/L (ref 3.5–5.1)
Sodium: 143 mmol/L (ref 135–145)

## 2019-12-05 LAB — BLOOD CULTURE ID PANEL (REFLEXED)

## 2019-12-05 LAB — HIV ANTIBODY (ROUTINE TESTING W REFLEX): HIV Screen 4th Generation wRfx: NONREACTIVE

## 2019-12-05 LAB — MRSA PCR SCREENING: MRSA by PCR: NEGATIVE

## 2019-12-05 MED ORDER — SODIUM CHLORIDE 0.9 % IV BOLUS
500.0000 mL | Freq: Once | INTRAVENOUS | Status: AC
  Administered 2019-12-05: 500 mL via INTRAVENOUS

## 2019-12-05 MED ORDER — FUROSEMIDE 10 MG/ML IJ SOLN
40.0000 mg | Freq: Once | INTRAMUSCULAR | Status: AC
  Administered 2019-12-05: 40 mg via INTRAVENOUS
  Filled 2019-12-05: qty 4

## 2019-12-05 MED ORDER — FUROSEMIDE 10 MG/ML IJ SOLN
40.0000 mg | Freq: Every day | INTRAMUSCULAR | Status: DC
  Administered 2019-12-06 – 2019-12-07 (×2): 40 mg via INTRAVENOUS
  Filled 2019-12-05 (×2): qty 4

## 2019-12-05 NOTE — Plan of Care (Signed)

## 2019-12-05 NOTE — Progress Notes (Signed)
PHARMACY - PHYSICIAN COMMUNICATION CRITICAL VALUE ALERT - BLOOD CULTURE IDENTIFICATION (BCID)  [name] is an 86 y.o. female who presented to [facility] on 12/04/2019 with a chief complaint of fever, SOB.  Assessment:  Presented with productive cough, greenish sputum, fever and SOB. Started on ceftriaxone and azithromycin.   BCx growing GNR in 1/4 bottles -BCID saying E Coli.  Name of physician (or Provider) Contacted: Dr Erlinda Hong  Current antibiotics: Ceftriaxone and azithromycin.   Changes to prescribed antibiotics recommended:  Patient is on recommended antibiotics - No changes needed  Results for orders placed or performed during the hospital encounter of 12/04/19  Blood Culture ID Panel (Reflexed) (Collected: 12/04/2019  6:40 PM)  Result Value Ref Range   Enterococcus species NOT DETECTED NOT DETECTED   Listeria monocytogenes NOT DETECTED NOT DETECTED   Staphylococcus species NOT DETECTED NOT DETECTED   Staphylococcus aureus (BCID) NOT DETECTED NOT DETECTED   Streptococcus species NOT DETECTED NOT DETECTED   Streptococcus agalactiae NOT DETECTED NOT DETECTED   Streptococcus pneumoniae NOT DETECTED NOT DETECTED   Streptococcus pyogenes NOT DETECTED NOT DETECTED   Acinetobacter baumannii NOT DETECTED NOT DETECTED   Enterobacteriaceae species DETECTED (A) NOT DETECTED   Enterobacter cloacae complex NOT DETECTED NOT DETECTED   Escherichia coli DETECTED (A) NOT DETECTED   Klebsiella oxytoca NOT DETECTED NOT DETECTED   Klebsiella pneumoniae NOT DETECTED NOT DETECTED   Proteus species NOT DETECTED NOT DETECTED   Serratia marcescens NOT DETECTED NOT DETECTED   Carbapenem resistance NOT DETECTED NOT DETECTED   Haemophilus influenzae NOT DETECTED NOT DETECTED   Neisseria meningitidis NOT DETECTED NOT DETECTED   Pseudomonas aeruginosa NOT DETECTED NOT DETECTED   Candida albicans NOT DETECTED NOT DETECTED   Candida glabrata NOT DETECTED NOT DETECTED   Candida krusei NOT DETECTED  NOT DETECTED   Candida parapsilosis NOT DETECTED NOT DETECTED   Candida tropicalis NOT DETECTED NOT DETECTED    Antonietta Jewel, PharmD, BCCCP Clinical Pharmacist  Phone: [phone] 12/05/2019 2:06 PM  Please check AMION for all [place] phone numbers After 10:00 PM, call [place] [phone]

## 2019-12-05 NOTE — Progress Notes (Addendum)
PROGRESS NOTE    [name]  LZJ:[id number] DOB: Sep 14, 1930 DOA: 12/04/2019 PCP: [name], MD    Chief Complaint  Patient presents with  . Fever  . Shortness of Breath    Brief Narrative:  Chief Complaint: Fever, SOB  HPI: [name] is a 86 y.o. female with medical history significant of A.Fib on eliquis, HTN, breast CA on tamoxifen, early dementia.  Pt presents to ED with c/o cough productive of greenish sputum, fever, SOB.  Symptoms onset several days ago, worsening.  Pt has had multiple UTIs recently.  Pt has had COVID and COVID vaccine reportedly.   ED Course: Tm 103 on arrival, BP 79/36 initially, improved to 99/48 with IVF.  WBC 17k.  New 4L O2 requirement.  CXR shows streaky opacity in R base: actelectasis vs early infiltrate.  Subjective:  Fever coming down, she is on 2 L oxygen  Assessment & Plan:   Principal Problem:   Sepsis due to pneumonia [facility]) Active Problems:   Hypertension   Paroxysmal atrial fibrillation (HCC)   Breast cancer (HCC)   Right lower lobe pneumonia   Acute hypoxemic respiratory failure (HCC)   Sepsis present on admission, with fever, leukocytosis, hypotension (blood pressure on presentation 85/37), pneumonia/ecoli bacteremia/acute hypoxic respiratory failure required 4 L oxygen supplement on presentation -Blood culture positive for E. coli, urine culture in process -Continue current antibiotics,  -home BP meds Norvasc, benazepril, Lasix held since admission, her blood pressure start to trend up this afternoon, and has audible wheezing, will stop IV fluids ,give 1 dose of IV Lasix,    History of paroxysmal A. Fib On amiodarone and adequate at home continue Currently sinus rhythm  Hypertension; presented with sepsis Home BP meds held since admission, resume when BP is stabilized  H/o BRCA - cont tamoxifen  Hypothyroidism Continue Synthroid  Early dementia, she is alert oriented x3  today  Failure to thrive: We will get PT once medically stable  DVT prophylaxis: apixaban (ELIQUIS) tablet 2.5 mg Start: 12/04/19 2200 apixaban (ELIQUIS) tablet 2.5 mg   Code Status: DNR Family Communication: She declined my offer to call her son Disposition:   Status is: Inpatient    Dispo: The patient is from: [place]              Anticipated d/c is to: TBD              Anticipated d/c date is: TBD              Patient currently getting treatment for bacteremia, pneumonia  Consultants:   None  Procedures:   None  Antimicrobials:   Rocephin and Zithromax     Objective: Vitals:   12/05/19 0100 12/05/19 0245 12/05/19 0327 12/05/19 0730  BP: (!) 150/72 (!) 125/54 (!) 141/88 (!) 94/54  Pulse: 65 65 68 63  Resp: _0 Temp:  99.8 F (37.7 C) 99.8 F (37.7 C) 99.1 F (37.3 C)  TempSrc:  Axillary Oral Axillary  SpO2: 98% 94% 99% 98%  Weight:   61.7 kg   Height:   _1  (1.448 m)    No intake or output data in the 24 hours ending 12/05/19 0851 Filed Weights   12/05/19 0327  Weight: 61.7 kg    Examination:  General exam: Frail elderly , AAOx3 ,calm, NAD Respiratory system: Audible wheezing ,respiratory effort normal. Cardiovascular system: S1 & S2 heard, RRR. No JVD, no murmur, No pedal edema. Gastrointestinal system: Abdomen is nondistended,  soft and nontender. No organomegaly or masses felt. Normal bowel sounds heard. Central nervous system: Alert and oriented. No focal neurological deficits. Extremities: Generalized weakness, moving all extremities Skin: No rashes, lesions or ulcers Psychiatry: Judgement and insight appear normal. Mood & affect appropriate.     Data Reviewed: I have personally reviewed following labs and imaging studies  CBC: Recent Labs  Lab 12/04/19 1802 12/05/19 0351  WBC 17.0* 15.6*  NEUTROABS 14.6*  --   HGB 10.3* 10.9*  HCT 33.7* 36.1  MCV 90.3 91.9  PLT 160 141*    Basic Metabolic Panel: Recent Labs  Lab  12/04/19 1802 12/05/19 0351  NA 138 143  K 3.5 3.7  CL 104 111  CO2 22 20*  GLUCOSE 219* 124*  BUN 25* 20  CREATININE 0.81 0.74  CALCIUM 8.3* 7.9*    GFR: Estimated Creatinine Clearance: 38.1 mL/min (by C-G formula based on SCr of 0.74 mg/dL).  Liver Function Tests: Recent Labs  Lab 12/04/19 1802  AST 13*  ALT 10  ALKPHOS 35*  BILITOT 0.7  PROT 5.8*  ALBUMIN 3.1*    CBG: No results for input(s): GLUCAP in the last 168 hours.   Recent Results (from the past 240 hour(s))  Blood Culture (routine x 2)     Status: None (Preliminary result)   Collection Time: 12/04/19  6:04 PM   Specimen: BLOOD RIGHT WRIST  Result Value Ref Range Status   Specimen Description BLOOD RIGHT WRIST  Final   Special Requests   Final    BOTTLES DRAWN AEROBIC AND ANAEROBIC Blood Culture adequate volume   Culture   Final    NO GROWTH < 24 HOURS Performed at [facility], [address], [place], [place] [address]    Report Status PENDING  Incomplete  Blood Culture (routine x 2)     Status: None (Preliminary result)   Collection Time: 12/04/19  6:40 PM   Specimen: BLOOD RIGHT HAND  Result Value Ref Range Status   Specimen Description BLOOD RIGHT HAND  Final   Special Requests   Final    BOTTLES DRAWN AEROBIC AND ANAEROBIC Blood Culture adequate volume   Culture   Final    NO GROWTH < 12 HOURS Performed at [facility], [place] [address] [place], [place] [address]    Report Status PENDING  Incomplete  SARS Coronavirus 2 by RT PCR (hospital order, performed in [facility] hospital lab) Nasopharyngeal Nasopharyngeal Swab     Status: None   Collection Time: 12/04/19  7:45 PM   Specimen: Nasopharyngeal Swab  Result Value Ref Range Status   SARS Coronavirus 2 NEGATIVE NEGATIVE Final    Comment: (NOTE) SARS-CoV-2 target nucleic acids are NOT DETECTED.  The SARS-CoV-2 RNA is generally detectable in upper and lower respiratory specimens during the acute phase of infection. The  lowest concentration of SARS-CoV-2 viral copies this assay can detect is 250 copies / mL. A negative result does not preclude SARS-CoV-2 infection and should not be used as the sole basis for treatment or other patient management decisions.  A negative result may occur with improper specimen collection / handling, submission of specimen other than nasopharyngeal swab, presence of viral mutation(s) within the areas targeted by this assay, and inadequate number of viral copies (<250 copies / mL). A negative result must be combined with clinical observations, patient history, and epidemiological information.  Fact Sheet for Patients:   StrictlyIdeas.no  Fact Sheet for Healthcare Providers: BankingDealers.co.za  This test  is not yet approved or  cleared by the [place] and has been authorized for detection and/or diagnosis of SARS-CoV-2 by FDA under an Emergency Use Authorization (EUA).  This EUA will remain in effect (meaning this test can be used) for the duration of the COVID-19 declaration under Section 564(b)(1) of the Act, 21 U.S.C. section 360bbb-3(b)(1), unless the authorization is terminated or revoked sooner.  Performed at [facility], [place] [address] [place], [place] [address]   MRSA PCR Screening     Status: None   Collection Time: 12/05/19  3:46 AM   Specimen: Nasopharyngeal  Result Value Ref Range Status   MRSA by PCR NEGATIVE NEGATIVE Final    Comment:        The GeneXpert MRSA Assay (FDA approved for NASAL specimens only), is one component of a comprehensive MRSA colonization surveillance program. It is not intended to diagnose MRSA infection nor to guide or monitor treatment for MRSA infections. Performed at [facility], [place] [address], [place], [place] [address]          Radiology Studies: DG Chest Port 1 View  Result Date: 12/04/2019 CLINICAL DATA:  Shortness of breath, fever  EXAM: PORTABLE CHEST 1 VIEW COMPARISON:  Radiograph 09/18/2019 FINDINGS: Suboptimal projection due to patient difficulty with positioning. Slight left anterior obliquity. Streaky opacities in the right lung base, favor atelectasis though early infection is not excluded in the setting of fever. Remaining portions the lungs are clear. Stable cardiomediastinal contours accounting for differences in technique with mild cardiomegaly and a calcified aorta. No pneumothorax or visible effusion though portion of the left costophrenic sulcus is collimated. Telemetry leads overlie the chest. No acute osseous or soft tissue abnormality. IMPRESSION: 1. Suboptimal projection due to patient difficulty with positioning. 2. Streaky opacities in the right lung base, favor atelectasis though early infection is not excluded in the setting of fever. 3. Stable cardiomegaly. 4.  Aortic Atherosclerosis (ICD10-I70.0). Electronically Signed   By: Lovena Le M.D.   On: 12/04/2019 18:53        Scheduled Meds: . amiodarone  100 mg Oral Daily  . apixaban  2.5 mg Oral BID  . baclofen  10 mg Oral TID  . gabapentin  200 mg Oral BID  . levothyroxine  50 mcg Oral Daily  . tamoxifen  10 mg Oral Daily   Continuous Infusions: . sodium chloride 125 mL/hr at 12/05/19 0252  . azithromycin Stopped (12/05/19 0248)  . cefTRIAXone (ROCEPHIN)  IV    . sodium chloride       LOS: 1 day   Time spent: 4mns Greater than 50% of this time was spent in counseling, explanation of diagnosis, planning of further management, and coordination of care.  I have personally reviewed and interpreted on  12/05/2019 daily labs, tele strips, imagings as discussed above under date review session and assessment and plans.  I reviewed all nursing notes, pharmacy notes,  vitals, pertinent old records  I have discussed plan of care as described above with RN on 12/05/2019  Voice Recognition /Dragon dictation system was used to create this note,  attempts have been made to correct errors. Please contact the author with questions and/or clarifications.   FFlorencia Reasons MD PhD FACP [facility]  Available via Epic secure chat 7am-7pm for nonurgent issues Please page for urgent issues To page the attending provider between 7A-7P or the covering provider during after hours 7P-7A, please log into the web site [email or web address] and access  using universal [place] password for that web site. If you do not have the password, please call the hospital operator.    12/05/2019, 8:51 AM

## 2019-12-06 DIAGNOSIS — A419 Sepsis, unspecified organism: Secondary | ICD-10-CM | POA: Diagnosis not present

## 2019-12-06 DIAGNOSIS — I480 Paroxysmal atrial fibrillation: Secondary | ICD-10-CM | POA: Diagnosis not present

## 2019-12-06 DIAGNOSIS — J9601 Acute respiratory failure with hypoxia: Secondary | ICD-10-CM | POA: Diagnosis not present

## 2019-12-06 DIAGNOSIS — J189 Pneumonia, unspecified organism: Secondary | ICD-10-CM | POA: Diagnosis not present

## 2019-12-06 LAB — BASIC METABOLIC PANEL
Anion gap: 7 (ref 5–15)
BUN: 13 mg/dL (ref 8–23)
CO2: 25 mmol/L (ref 22–32)
Calcium: 7.5 mg/dL — ABNORMAL LOW (ref 8.9–10.3)
Chloride: 107 mmol/L (ref 98–111)
Creatinine, Ser: 0.53 mg/dL (ref 0.44–1.00)
GFR calc Af Amer: >60 mL/min (ref >60)
GFR calc non Af Amer: >60 mL/min (ref >60)
Glucose, Bld: 120 mg/dL — ABNORMAL HIGH (ref 70–99)
Potassium: 2.7 mmol/L — CL (ref 3.5–5.1)
Sodium: 139 mmol/L (ref 135–145)

## 2019-12-06 LAB — CBC WITH DIFFERENTIAL/PLATELET
Abs Immature Granulocytes: 0.08 10*3/uL — ABNORMAL HIGH (ref 0.00–0.07)
Basophils Absolute: 0 10*3/uL (ref 0.0–0.1)
Basophils Relative: 0 %
Eosinophils Absolute: 0.1 10*3/uL (ref 0.0–0.5)
Eosinophils Relative: 1 %
HCT: 30.7 % — ABNORMAL LOW (ref 36.0–46.0)
Hemoglobin: 9.5 g/dL — ABNORMAL LOW (ref 12.0–15.0)
Immature Granulocytes: 1 %
Lymphocytes Relative: 7 %
Lymphs Abs: 0.9 10*3/uL (ref 0.7–4.0)
MCH: 28 pg (ref 26.0–34.0)
MCHC: 30.9 g/dL (ref 30.0–36.0)
MCV: 90.6 fL (ref 80.0–100.0)
Monocytes Absolute: 1.1 10*3/uL — ABNORMAL HIGH (ref 0.1–1.0)
Monocytes Relative: 9 %
Neutro Abs: 9.9 10*3/uL — ABNORMAL HIGH (ref 1.7–7.7)
Neutrophils Relative %: 82 %
Platelets: 134 10*3/uL — ABNORMAL LOW (ref 150–400)
RBC: 3.39 MIL/uL — ABNORMAL LOW (ref 3.87–5.11)
RDW: 15 % (ref 11.5–15.5)
WBC: 12 10*3/uL — ABNORMAL HIGH (ref 4.0–10.5)
nRBC: 0 % (ref 0.0–0.2)

## 2019-12-06 LAB — MAGNESIUM: Magnesium: 1.9 mg/dL (ref 1.7–2.4)

## 2019-12-06 LAB — LACTIC ACID, PLASMA: Lactic Acid, Venous: 0.8 mmol/L (ref 0.5–1.9)

## 2019-12-06 LAB — PROCALCITONIN: Procalcitonin: 7.27 ng/mL

## 2019-12-06 MED ORDER — BENAZEPRIL HCL 5 MG PO TABS
5.0000 mg | ORAL_TABLET | Freq: Every day | ORAL | Status: DC
  Administered 2019-12-06 – 2019-12-11 (×6): 5 mg via ORAL
  Filled 2019-12-06 (×6): qty 1

## 2019-12-06 MED ORDER — POTASSIUM CHLORIDE CRYS ER 20 MEQ PO TBCR
40.0000 meq | EXTENDED_RELEASE_TABLET | ORAL | Status: AC
  Administered 2019-12-06 (×2): 40 meq via ORAL
  Filled 2019-12-06 (×2): qty 2

## 2019-12-06 MED ORDER — POTASSIUM CHLORIDE 10 MEQ/100ML IV SOLN
10.0000 meq | INTRAVENOUS | Status: AC
  Administered 2019-12-06 (×4): 10 meq via INTRAVENOUS
  Filled 2019-12-06 (×4): qty 100

## 2019-12-06 NOTE — Progress Notes (Signed)
Pt is alert times 3, not oriented to time and date, tried to wean her off oxygen, saturation dropped to 87%, oxygen restarted at 2l/min.pt is comfortably lying, turning and positioning continue, will continue to monitor  Palma Holter, RN

## 2019-12-06 NOTE — Progress Notes (Signed)
PROGRESS NOTE    [name]  LSL:[id number] DOB: 22-Feb-1931 DOA: 12/04/2019 PCP: [name], MD    Chief Complaint  Patient presents with  . Fever  . Shortness of Breath    Brief Narrative:  Chief Complaint: Fever, SOB  HPI: [name] is a 86 y.o. female with medical history significant of A.Fib on eliquis, HTN, breast CA on tamoxifen, early dementia.  Pt presents to ED with c/o cough productive of greenish sputum, fever, SOB.  Symptoms onset several days ago, worsening.  Pt has had multiple UTIs recently.  Pt has had COVID and COVID vaccine reportedly.   ED Course: Tm 103 on arrival, BP 79/36 initially, improved to 99/48 with IVF.  WBC 17k.  New 4L O2 requirement.  CXR shows streaky opacity in R base: actelectasis vs early infiltrate.  Subjective:  Fever resolved, T-max 99.2 last 24 hours 2 L urine output documented last 24 hours, denies ab pain, no dysuria  Cough resolved , denies short of breath , no wheezing , RN report O2 sats dropped to 89 while on room air at rest,she is currently satting at 96% on 2 L oxygen  Assessment & Plan:   Principal Problem:   Sepsis due to pneumonia [facility]) Active Problems:   Hypertension   Paroxysmal atrial fibrillation (HCC)   Breast cancer ([place])   Right lower lobe pneumonia   Acute hypoxemic respiratory failure (HCC)   Sepsis present on admission, with fever, leukocytosis, hypotension (blood pressure on presentation 85/37), UTI/ E. coli bacteremia [place] pneumonia/acute hypoxic respiratory failure required 4 L oxygen supplement on presentation -Blood culture positive for E. coli, urine culture positive for gram-negative rods, urine strep pneumo antigen ordered yesterday pending collection --Blood pressure improving , resume benazepril and Lasix at lower dose, great gradually titrate dose back to home dose.  Continue hold Norvasc. - Continue current antibiotics, follow-up on final blood culture  result   Hypokalemia, replace K, recheck in the morning  History of paroxysmal A. Fib Continue on amiodarone and Eliquis  Currently sinus rhythm  Hypertension; presented with sepsis Home BP meds held on admission, blood pressure start to trend up, gradually restart blood pressure medication  H/o BRCA - cont tamoxifen  Hypothyroidism Continue Synthroid  Early dementia, she is alert oriented x3 today  Failure to thrive:  PT eval  DVT prophylaxis: apixaban (ELIQUIS) tablet 2.5 mg Start: 12/04/19 2200 apixaban (ELIQUIS) tablet 2.5 mg   Code Status: DNR Family Communication: son  ( who is a Therapist, sports works in [place]) over the phone with permission Disposition:   Status is: Inpatient   Dispo: The patient is from: [place]              Anticipated d/c is to: pending PT eval              Anticipated d/c date is: Awaiting for blood culture result, pending PT eval                Consultants:   None  Procedures:   None  Antimicrobials:   Rocephin and Zithromax     Objective: Vitals:   12/05/19 2305 12/06/19 0347 12/06/19 0500 12/06/19 0727  BP: (!) 134/65 (!) 151/68  (!) 165/77  Pulse: 59 62  70  Resp: 15 19  21   Temp: 99.2 F (37.3 C) 98.3 F (36.8 C)  98.3 F (36.8 C)  TempSrc: Axillary Axillary  Oral  SpO2: 98% 98%  98%  Weight:   62.3 kg  Height:        Intake/Output Summary (Last 24 hours) at 12/06/2019 0830 Last data filed at 12/06/2019 3354 Gross per 24 hour  Intake 2310 ml  Output 2300 ml  Net 10 ml   Filed Weights   12/05/19 0327 12/06/19 0500  Weight: 61.7 kg 62.3 kg    Examination:  General exam: Frail elderly , AAOx3 ,calm, NAD Respiratory system:  wheezing resolved ,respiratory effort normal. Cardiovascular system: S1 & S2 heard, RRR. No JVD, no murmur, No pedal edema. Gastrointestinal system: Abdomen is nondistended, soft and nontender. No organomegaly or masses felt. Normal bowel sounds heard. Central nervous system: Alert and oriented.  No focal neurological deficits. Extremities: Generalized weakness, moving all extremities, right lateral leg edema ( reports chronic),nontender Skin: Venous stasis skin changes bilateral lower extremity no rashes, lesions or ulcers Psychiatry: Judgement and insight appear normal. Mood & affect appropriate.     Data Reviewed: I have personally reviewed following labs and imaging studies  CBC: Recent Labs  Lab 12/04/19 1802 12/05/19 0351 12/06/19 0308  WBC 17.0* 15.6* 12.0*  NEUTROABS 14.6*  --  9.9*  HGB 10.3* 10.9* 9.5*  HCT 33.7* 36.1 30.7*  MCV 90.3 91.9 90.6  PLT 160 141* 134*    Basic Metabolic Panel: Recent Labs  Lab 12/04/19 1802 12/05/19 0351 12/06/19 0308  NA 138 143 139  K 3.5 3.7 2.7*  CL 104 111 107  CO2 22 20* 25  GLUCOSE 219* 124* 120*  BUN 25* 20 13  CREATININE 0.81 0.74 0.53  CALCIUM 8.3* 7.9* 7.5*  MG  --   --  1.9    GFR: Estimated Creatinine Clearance: 38.3 mL/min (by C-G formula based on SCr of 0.53 mg/dL).  Liver Function Tests: Recent Labs  Lab 12/04/19 1802  AST 13*  ALT 10  ALKPHOS 35*  BILITOT 0.7  PROT 5.8*  ALBUMIN 3.1*    CBG: No results for input(s): GLUCAP in the last 168 hours.   Recent Results (from the past 240 hour(s))  Blood Culture (routine x 2)     Status: None (Preliminary result)   Collection Time: 12/04/19  6:04 PM   Specimen: BLOOD RIGHT WRIST  Result Value Ref Range Status   Specimen Description BLOOD RIGHT WRIST  Final   Special Requests   Final    BOTTLES DRAWN AEROBIC AND ANAEROBIC Blood Culture adequate volume   Culture  Setup Time   Final    GRAM NEGATIVE RODS AEROBIC BOTTLE ONLY CRITICAL VALUE NOTED.  VALUE IS CONSISTENT WITH PREVIOUSLY REPORTED AND CALLED VALUE.    Culture   Final    NO GROWTH 2 DAYS Performed at [facility], [place] [address], [place], [place] [address]    Report Status PENDING  Incomplete  Blood Culture (routine x 2)     Status: Abnormal (Preliminary result)    Collection Time: 12/04/19  6:40 PM   Specimen: BLOOD RIGHT HAND  Result Value Ref Range Status   Specimen Description BLOOD RIGHT HAND  Final   Special Requests   Final    BOTTLES DRAWN AEROBIC AND ANAEROBIC Blood Culture adequate volume   Culture  Setup Time   Final    GRAM NEGATIVE RODS AEROBIC BOTTLE ONLY CRITICAL RESULT CALLED TO, READ BACK BY AND VERIFIED WITH: PHARMD M [place] [id number] AT 9 BY CM    Culture (A)  Final    ESCHERICHIA COLI SUSCEPTIBILITIES TO FOLLOW Performed at [facility], [place] [address], [place], [place]  [address]    Report Status PENDING  Incomplete  Blood Culture ID Panel (Reflexed)     Status: Abnormal   Collection Time: 12/04/19  6:40 PM  Result Value Ref Range Status   Enterococcus species NOT DETECTED NOT DETECTED Final   Listeria monocytogenes NOT DETECTED NOT DETECTED Final   Staphylococcus species NOT DETECTED NOT DETECTED Final   Staphylococcus aureus (BCID) NOT DETECTED NOT DETECTED Final   Streptococcus species NOT DETECTED NOT DETECTED Final   Streptococcus agalactiae NOT DETECTED NOT DETECTED Final   Streptococcus pneumoniae NOT DETECTED NOT DETECTED Final   Streptococcus pyogenes NOT DETECTED NOT DETECTED Final   Acinetobacter baumannii NOT DETECTED NOT DETECTED Final   Enterobacteriaceae species DETECTED (A) NOT DETECTED Final    Comment: Enterobacteriaceae represent a large family of gram-negative bacteria, not a single organism. CRITICAL RESULT CALLED TO, READ BACK BY AND VERIFIED WITH: PHARMD M [place] [id number] AT 0233 BY CM    Enterobacter cloacae complex NOT DETECTED NOT DETECTED Final   Escherichia coli DETECTED (A) NOT DETECTED Final    Comment: CRITICAL RESULT CALLED TO, READ BACK BY AND VERIFIED WITH: PHARMD M [place] [id number] AT 1337 BY CM    Klebsiella oxytoca NOT DETECTED NOT DETECTED Final   Klebsiella pneumoniae NOT DETECTED NOT DETECTED Final   Proteus species NOT DETECTED NOT DETECTED Final   Serratia marcescens NOT  DETECTED NOT DETECTED Final   Carbapenem resistance NOT DETECTED NOT DETECTED Final   Haemophilus influenzae NOT DETECTED NOT DETECTED Final   Neisseria meningitidis NOT DETECTED NOT DETECTED Final   Pseudomonas aeruginosa NOT DETECTED NOT DETECTED Final   Candida albicans NOT DETECTED NOT DETECTED Final   Candida glabrata NOT DETECTED NOT DETECTED Final   Candida krusei NOT DETECTED NOT DETECTED Final   Candida parapsilosis NOT DETECTED NOT DETECTED Final   Candida tropicalis NOT DETECTED NOT DETECTED Final    Comment: Performed at [facility], [place] [address], [place],  [address]  SARS Coronavirus 2 by RT PCR (hospital order, performed in [facility] hospital lab) Nasopharyngeal Nasopharyngeal Swab     Status: None   Collection Time: 12/04/19  7:45 PM   Specimen: Nasopharyngeal Swab  Result Value Ref Range Status   SARS Coronavirus 2 NEGATIVE NEGATIVE Final    Comment: (NOTE) SARS-CoV-2 target nucleic acids are NOT DETECTED.  The SARS-CoV-2 RNA is generally detectable in upper and lower respiratory specimens during the acute phase of infection. The lowest concentration of SARS-CoV-2 viral copies this assay can detect is 250 copies / mL. A negative result does not preclude SARS-CoV-2 infection and should not be used as the sole basis for treatment or other patient management decisions.  A negative result may occur with improper specimen collection / handling, submission of specimen other than nasopharyngeal swab, presence of viral mutation(s) within the areas targeted by this assay, and inadequate number of viral copies (<250 copies / mL). A negative result must be combined with clinical observations, patient history, and epidemiological information.  Fact Sheet for Patients:   StrictlyIdeas.no  Fact Sheet for Healthcare Providers: BankingDealers.co.za  This test is not yet approved or  cleared by the [place] FDA  and has been authorized for detection and/or diagnosis of SARS-CoV-2 by FDA under an Emergency Use Authorization (EUA).  This EUA will remain in effect (meaning this test can be used) for the duration of the COVID-19 declaration under Section 564(b)(1) of the Act, 21 U.S.C. section 360bbb-3(b)(1), unless the authorization is terminated or revoked  sooner.  Performed at [facility], [place] [address] [place], [place] [address]   MRSA PCR Screening     Status: None   Collection Time: 12/05/19  3:46 AM   Specimen: Nasopharyngeal  Result Value Ref Range Status   MRSA by PCR NEGATIVE NEGATIVE Final    Comment:        The GeneXpert MRSA Assay (FDA approved for NASAL specimens only), is one component of a comprehensive MRSA colonization surveillance program. It is not intended to diagnose MRSA infection nor to guide or monitor treatment for MRSA infections. Performed at [facility], [place] [address], [place], [place] [address]          Radiology Studies: DG Chest Port 1 View  Result Date: 12/04/2019 CLINICAL DATA:  Shortness of breath, fever EXAM: PORTABLE CHEST 1 VIEW COMPARISON:  Radiograph 09/18/2019 FINDINGS: Suboptimal projection due to patient difficulty with positioning. Slight left anterior obliquity. Streaky opacities in the right lung base, favor atelectasis though early infection is not excluded in the setting of fever. Remaining portions the lungs are clear. Stable cardiomediastinal contours accounting for differences in technique with mild cardiomegaly and a calcified aorta. No pneumothorax or visible effusion though portion of the left costophrenic sulcus is collimated. Telemetry leads overlie the chest. No acute osseous or soft tissue abnormality. IMPRESSION: 1. Suboptimal projection due to patient difficulty with positioning. 2. Streaky opacities in the right lung base, favor atelectasis though early infection is not excluded in the setting of fever. 3. Stable  cardiomegaly. 4.  Aortic Atherosclerosis (ICD10-I70.0). Electronically Signed   By: Lovena Le M.D.   On: 12/04/2019 18:53        Scheduled Meds: . amiodarone  100 mg Oral Daily  . apixaban  2.5 mg Oral BID  . baclofen  10 mg Oral TID  . furosemide  40 mg Intravenous Daily  . gabapentin  200 mg Oral BID  . levothyroxine  50 mcg Oral Daily  . potassium chloride  40 mEq Oral Q4H  . tamoxifen  10 mg Oral Daily   Continuous Infusions: . azithromycin 500 mg (12/05/19 2102)  . cefTRIAXone (ROCEPHIN)  IV 2 g (12/05/19 1129)  . potassium chloride 10 mEq (12/06/19 0731)     LOS: 2 days   Time spent: 56mns Greater than 50% of this time was spent in counseling, explanation of diagnosis, planning of further management, and coordination of care.  I have personally reviewed and interpreted on  12/06/2019 daily labs, tele strips, imagings as discussed above under date review session and assessment and plans.  I reviewed all nursing notes, pharmacy notes,  vitals, pertinent old records  I have discussed plan of care as described above with RN on 12/06/2019  Voice Recognition /Dragon dictation system was used to create this note, attempts have been made to correct errors. Please contact the author with questions and/or clarifications.   FFlorencia Reasons MD PhD FACP [facility]  Available via Epic secure chat 7am-7pm for nonurgent issues Please page for urgent issues To page the attending provider between 7A-7P or the covering provider during after hours 7P-7A, please log into the web site [email or web address] and access using universal [place] password for that web site. If you do not have the password, please call the hospital operator.    12/06/2019, 8:30 AM

## 2019-12-07 DIAGNOSIS — J189 Pneumonia, unspecified organism: Secondary | ICD-10-CM | POA: Diagnosis not present

## 2019-12-07 DIAGNOSIS — A419 Sepsis, unspecified organism: Secondary | ICD-10-CM | POA: Diagnosis not present

## 2019-12-07 LAB — BASIC METABOLIC PANEL
Anion gap: 8 (ref 5–15)
BUN: 12 mg/dL (ref 8–23)
CO2: 24 mmol/L (ref 22–32)
Calcium: 8.2 mg/dL — ABNORMAL LOW (ref 8.9–10.3)
Chloride: 108 mmol/L (ref 98–111)
Creatinine, Ser: 0.55 mg/dL (ref 0.44–1.00)
GFR calc Af Amer: >60 mL/min (ref >60)
GFR calc non Af Amer: >60 mL/min (ref >60)
Glucose, Bld: 125 mg/dL — ABNORMAL HIGH (ref 70–99)
Potassium: 4 mmol/L (ref 3.5–5.1)
Sodium: 140 mmol/L (ref 135–145)

## 2019-12-07 LAB — CBC WITH DIFFERENTIAL/PLATELET
Abs Immature Granulocytes: 0.06 10*3/uL (ref 0.00–0.07)
Basophils Absolute: 0 10*3/uL (ref 0.0–0.1)
Basophils Relative: 0 %
Eosinophils Absolute: 0.1 10*3/uL (ref 0.0–0.5)
Eosinophils Relative: 1 %
HCT: 34.2 % — ABNORMAL LOW (ref 36.0–46.0)
Hemoglobin: 10.2 g/dL — ABNORMAL LOW (ref 12.0–15.0)
Immature Granulocytes: 1 %
Lymphocytes Relative: 8 %
Lymphs Abs: 1 10*3/uL (ref 0.7–4.0)
MCH: 27 pg (ref 26.0–34.0)
MCHC: 29.8 g/dL — ABNORMAL LOW (ref 30.0–36.0)
MCV: 90.5 fL (ref 80.0–100.0)
Monocytes Absolute: 1.2 10*3/uL — ABNORMAL HIGH (ref 0.1–1.0)
Monocytes Relative: 10 %
Neutro Abs: 9.6 10*3/uL — ABNORMAL HIGH (ref 1.7–7.7)
Neutrophils Relative %: 80 %
Platelets: 163 10*3/uL (ref 150–400)
RBC: 3.78 MIL/uL — ABNORMAL LOW (ref 3.87–5.11)
RDW: 14.6 % (ref 11.5–15.5)
WBC: 12 10*3/uL — ABNORMAL HIGH (ref 4.0–10.5)
nRBC: 0 % (ref 0.0–0.2)

## 2019-12-07 LAB — CULTURE, BLOOD (ROUTINE X 2): Special Requests: ADEQUATE

## 2019-12-07 LAB — PROCALCITONIN: Procalcitonin: 4.12 ng/mL

## 2019-12-07 LAB — MAGNESIUM: Magnesium: 1.8 mg/dL (ref 1.7–2.4)

## 2019-12-07 MED ORDER — CEPHALEXIN 500 MG PO CAPS
500.0000 mg | ORAL_CAPSULE | Freq: Four times a day (QID) | ORAL | Status: DC
  Administered 2019-12-07 – 2019-12-11 (×18): 500 mg via ORAL
  Filled 2019-12-07 (×20): qty 1

## 2019-12-07 MED ORDER — AZITHROMYCIN 500 MG PO TABS
500.0000 mg | ORAL_TABLET | Freq: Every day | ORAL | Status: AC
  Administered 2019-12-07 – 2019-12-08 (×2): 500 mg via ORAL
  Filled 2019-12-07 (×2): qty 1

## 2019-12-07 MED ORDER — FUROSEMIDE 80 MG PO TABS: 80.0000 mg | ORAL_TABLET | ORAL | Status: DC

## 2019-12-07 MED ORDER — FUROSEMIDE 40 MG PO TABS
40.0000 mg | ORAL_TABLET | ORAL | Status: DC
  Administered 2019-12-08 – 2019-12-11 (×4): 40 mg via ORAL
  Filled 2019-12-07 (×4): qty 1

## 2019-12-07 NOTE — Care Management Important Message (Signed)
Important Message  Patient Details  Name: [name] MRN: [id number] Date of Birth: 07/21/1930   Medicare Important Message Given:  Yes   Patient refused to sign.  Unsigned copy left  Raymir Frommelt 12/07/2019, 3:39 PM

## 2019-12-07 NOTE — Evaluation (Addendum)
Physical Therapy Evaluation Patient Details Name: [name] MRN: [id number] DOB: 11-23-1930 Today's Date: 12/07/2019   History of Present Illness  86 yo admitted with SOB, cough with sepsis and PNA. PMHx: breast CA, Afib, HTN, HLD, recent UTI, covid and covid vaccine  Clinical Impression  Pt supine with HOB 20 degrees on arrival with lunch tray present. Pt educated for need to mobilize and sit up for lunch. Pt refused to get OOB despite education for mobility, need for improvement in lung function and need to mobilize to return to ALF. Pt moved left UE to reach for call button but refused to attempt opening items on tray. Pt lifted bil legs off surface RLE >LLE with RLE lifted x 3 and bent right knee x 1. This is the extent of movement pt performed. Pt with decreased strength, function, mobility, cognition and safety who will benefit from acute therapy to maximize safety, strength and function.  Pt HOB elevated to 40 degrees and meal tray positioned within reach with call bell and alarm on. RN aware of all above.  Called son Percell Miller in [place] who stated he doesn't speak to his mother because she is stubborn and won't do anything she doesn't want to do. He stated to call [facility] for PLOF.  Staff at [facility] confirm pt report that pt is mod I in all aspects of mobility and walks to dining hall for meals.     Follow Up Recommendations SNF;Supervision for mobility/OOB    Equipment Recommendations  None recommended by PT    Recommendations for Other Services OT consult     Precautions / Restrictions Precautions Precautions: Fall      Mobility  Bed Mobility               General bed mobility comments: pt refused to attempt and required HOB elevated to achieve sitting for meal  Transfers                    Ambulation/Gait                Stairs            Wheelchair Mobility    Modified Rankin (Stroke Patients Only)       Balance                                              Pertinent Vitals/Pain      Home Living Family/patient expects to be discharged to:: Assisted living               Home Equipment: Walker - 2 wheels      Prior Function Level of Independence: Independent with assistive device(s)         Comments: walks to dining room, bathes and dresses on her own. Is normally up and in the hall at [facility] asking for her medicine per staff     Hand Dominance        Extremity/Trunk Assessment   Upper Extremity Assessment Upper Extremity Assessment: Generalized weakness    Lower Extremity Assessment Lower Extremity Assessment: Generalized weakness (pt able to lift bil legs off surface)    Cervical / Trunk Assessment Cervical / Trunk Assessment: Kyphotic  Communication   Communication: No difficulties  Cognition Arousal/Alertness: Awake/alert Behavior During Therapy: Agitated Overall Cognitive Status: No family/caregiver present to determine baseline cognitive functioning  General Comments      Exercises     Assessment/Plan    PT Assessment Patient needs continued PT services  PT Problem List Decreased strength;Decreased mobility;Decreased activity tolerance;Decreased balance;Decreased cognition;Decreased knowledge of use of DME       PT Treatment Interventions DME instruction;Therapeutic exercise;Gait training;Balance training;Functional mobility training;Therapeutic activities;Patient/family education    PT Goals (Current goals can be found in the Care Plan section)  Acute Rehab PT Goals Patient Stated Goal: return home PT Goal Formulation: With patient Time For Goal Achievement: 12/21/19 Potential to Achieve Goals: Fair    Frequency Min 3X/week   Barriers to discharge Decreased caregiver support      Co-evaluation               AM-PAC PT "6 Clicks" Mobility  Outcome Measure Help needed turning  from your back to your side while in a flat bed without using bedrails?: A Lot Help needed moving from lying on your back to sitting on the side of a flat bed without using bedrails?: Total Help needed moving to and from a bed to a chair (including a wheelchair)?: Total Help needed standing up from a chair using your arms (e.g., wheelchair or bedside chair)?: Total Help needed to walk in hospital room?: Total Help needed climbing 3-5 steps with a railing? : Total 6 Click Score: 7    End of Session   Activity Tolerance: Treatment limited secondary to agitation Patient left: in bed;with bed alarm set;with call bell/phone within reach Nurse Communication: Other (comment) (lack of mobility and PLOF) PT Visit Diagnosis: Other abnormalities of gait and mobility (R26.89);Difficulty in walking, not elsewhere classified (R26.2);Muscle weakness (generalized) (M62.81)    Time: 3559-7416 PT Time Calculation (min) (ACUTE ONLY): 17 min   Charges:   PT Evaluation $PT Eval Moderate Complexity: 1 Mod          Arilynn Blakeney P, PT Acute Rehabilitation Services Pager: [phone] Office: [phone]   Sandy Salaam Decarlos Empey 12/07/2019, 12:03 PM

## 2019-12-07 NOTE — Progress Notes (Addendum)
PROGRESS NOTE    [name]  FGH:[id number] DOB: November 06, 1930 DOA: 12/04/2019 PCP: [name], MD   Brief Narrative:  Patient is 86 year old female with history of proximal A. fib on Eliquis, hypertension, breast cancer on tamoxifen, early dementia who presents with complaints of productive cough with fever, shortness of breath from [facility] assisted living facility.  She has history of multiple UTIs.  On presentation she was hypotensive, febrile, had leukocytosis and was found to have opacity in the right base suspicious for pneumonia.  She was requiring 4 L of oxygen per minute.  She was started on broad-spectrum antibiotics.  Blood cultures, urine culture showed pansensitive E. coli.  Currently she is hemodynamically stable.  PT/OT recommended skilled nursing facility.  Family requesting palliative care consultation for goals of care/potential hospice.  Assessment & Plan:   Principal Problem:   Severe sepsis ([place]) Active Problems:   Hypertension   Paroxysmal atrial fibrillation (HCC)   Breast cancer (HCC)   Right lower lobe pneumonia   Acute hypoxemic respiratory failure (HCC)   Severe Sepsis/bacteremia: Presented with fever, leukocytosis, hypotension.  Patient met criteria for severe sepsis on admission given SIRS criteria of respiratory rate  more than 20, temperature 101.5 and evidence of organ dysfunction including hypotension and respiratory failure. Chest x-ray showed right basilar pneumonia.  Urine culture, blood culture showing pansensitive E. coli.  Currently she is hemodynamically stable.  Antibiotics changed to oral.  Acute respiratory failure with hypoxia/right lower lobe pneumonia: Currently respiratory status is stable.  Will monitor on room air.  Continue current antibiotics.    Proximal A. fib: Currently rate is well controlled.  On Eliquis for anticoagulation.  On amiodarone.  Currently in normal sinus rhythm.  Hypertension: Hypertensive on presentation.   Currently blood pressure medications on hold.  History of breast cancer: On tamoxifen.  BRCA positive  Hypothyroidism: Continue Synthyroid  Early dementia: Currently alert and awake and oriented but seems confused on and off as per the son.  Debility/deconditioning: ALF resident.  PT recommended skilled facility on discharge.  Goals of care: DNR.  I had a long conversation with the son.  He says his mother is stubborn, noncompliance and uncooperative with the medical advice.  He requests palliative  care evaluation for possible hospice approach.      DVT prophylaxis: Eliquis Code Status: DNR Family Communication: Discussed with son on phone Status is: Inpatient  Remains inpatient appropriate because:Unsafe d/c plan   Dispo: The patient is from: ALF              Anticipated d/c is to: SNF              Anticipated d/c date is: 1 day              Patient currently is medically stable to d/c.    Consultants: None  Procedures:None  Antimicrobials:  Anti-infectives (From admission, onward)   Start     Dose/Rate Route Frequency Ordered Stop   12/07/19 1400  cephALEXin (KEFLEX) capsule 500 mg     Discontinue     500 mg Oral Every 6 hours 12/07/19 1351 12/13/19 1159   12/07/19 1400  azithromycin (ZITHROMAX) tablet 500 mg     Discontinue     500 mg Oral Daily 12/07/19 1351 12/09/19 0959   12/05/19 2000  vancomycin (VANCOREADY) IVPB 750 mg/150 mL  Status:  Discontinued        750 mg 150 mL/hr over 60 Minutes Intravenous Every 24 hours 12/04/19  2009 12/04/19 2133   12/05/19 1000  cefTRIAXone (ROCEPHIN) 2 g in sodium chloride 0.9 % 100 mL IVPB  Status:  Discontinued        2 g 200 mL/hr over 30 Minutes Intravenous Every 24 hours 12/04/19 2133 12/07/19 1350   12/04/19 2200  ceFEPIme (MAXIPIME) 2 g in sodium chloride 0.9 % 100 mL IVPB  Status:  Discontinued        2 g 200 mL/hr over 30 Minutes Intravenous Every 12 hours 12/04/19 2009 12/04/19 2133   12/04/19 2145  azithromycin  (ZITHROMAX) 500 mg in sodium chloride 0.9 % 250 mL IVPB  Status:  Discontinued        500 mg 250 mL/hr over 60 Minutes Intravenous Every 24 hours 12/04/19 2133 12/07/19 1350   12/04/19 1815  ceFEPIme (MAXIPIME) 2 g in sodium chloride 0.9 % 100 mL IVPB        2 g 200 mL/hr over 30 Minutes Intravenous  Once 12/04/19 1802 12/04/19 1948   12/04/19 1815  metroNIDAZOLE (FLAGYL) IVPB 500 mg        500 mg 100 mL/hr over 60 Minutes Intravenous  Once 12/04/19 1802 12/04/19 2002   12/04/19 1815  vancomycin (VANCOCIN) IVPB 1000 mg/200 mL premix        1,000 mg 200 mL/hr over 60 Minutes Intravenous  Once 12/04/19 1802 12/04/19 2105      Subjective:  Patient seen and examined at the bedside this morning.  Hemodynamically stable.  Comfortable.  Lying on the bed.  Denies any complaints. Objective: Vitals:   12/06/19 2324 12/07/19 0456 12/07/19 0500 12/07/19 1139  BP: (!) 163/83 (!) 154/80    Pulse: 70 78 72   Resp: _0 Temp: 98.9 F (37.2 C) 99.1 F (37.3 C)  98.5 F (36.9 C)  TempSrc: Oral Oral  Oral  SpO2: 99% 97%    Weight:   61.3 kg   Height:        Intake/Output Summary (Last 24 hours) at 12/07/2019 1446 Last data filed at 12/07/2019 1300 Gross per 24 hour  Intake 720 ml  Output 550 ml  Net 170 ml   Filed Weights   12/05/19 0327 12/06/19 0500 12/07/19 0500  Weight: 61.7 kg 62.3 kg 61.3 kg    Examination:  General exam: Elderly deconditioned, debilitated pleasant female HEENT:PERRL,Oral mucosa moist, Ear/Nose normal on gross exam Respiratory system: Bilateral equal air entry, normal vesicular breath sounds, no wheezes or crackles  Cardiovascular system: S1 & S2 heard, RRR. No JVD, murmurs, rubs, gallops or clicks. No pedal edema. Gastrointestinal system: Abdomen is distended, soft and nontender. No organomegaly or masses felt. Normal bowel sounds heard. Central nervous system: Alert and oriented.  Extremities: 1-2 + bilateral lower extremity  edema, no clubbing ,no  cyanosis, chronic venous stasis changes on bilateral lower extremities Skin: No rashes, lesions or ulcers,no icterus ,no pallor   Data Reviewed: I have personally reviewed following labs and imaging studies  CBC: Recent Labs  Lab 12/04/19 1802 12/05/19 0351 12/06/19 0308 12/07/19 0136  WBC 17.0* 15.6* 12.0* 12.0*  NEUTROABS 14.6*  --  9.9* 9.6*  HGB 10.3* 10.9* 9.5* 10.2*  HCT 33.7* 36.1 30.7* 34.2*  MCV 90.3 91.9 90.6 90.5  PLT 160 141* 134* 341   Basic Metabolic Panel: Recent Labs  Lab 12/04/19 1802 12/05/19 0351 12/06/19 0308 12/07/19 0136  NA 138 143 139 140  K 3.5 3.7 2.7* 4.0  CL 104 111 107 108  CO2 22 20*  25 24  GLUCOSE 219* 124* 120* 125*  BUN 25* _0 CREATININE 0.81 0.74 0.53 0.55  CALCIUM 8.3* 7.9* 7.5* 8.2*  MG  --   --  1.9 1.8   GFR: Estimated Creatinine Clearance: 38 mL/min (by C-G formula based on SCr of 0.55 mg/dL). Liver Function Tests: Recent Labs  Lab 12/04/19 1802  AST 13*  ALT 10  ALKPHOS 35*  BILITOT 0.7  PROT 5.8*  ALBUMIN 3.1*   No results for input(s): LIPASE, AMYLASE in the last 168 hours. No results for input(s): AMMONIA in the last 168 hours. Coagulation Profile: Recent Labs  Lab 12/04/19 1802  INR 1.5*   Cardiac Enzymes: No results for input(s): CKTOTAL, CKMB, CKMBINDEX, TROPONINI in the last 168 hours. BNP (last 3 results) No results for input(s): PROBNP in the last 8760 hours. HbA1C: No results for input(s): HGBA1C in the last 72 hours. CBG: No results for input(s): GLUCAP in the last 168 hours. Lipid Profile: No results for input(s): CHOL, HDL, LDLCALC, TRIG, CHOLHDL, LDLDIRECT in the last 72 hours. Thyroid Function Tests: No results for input(s): TSH, T4TOTAL, FREET4, T3FREE, THYROIDAB in the last 72 hours. Anemia Panel: No results for input(s): VITAMINB12, FOLATE, FERRITIN, TIBC, IRON, RETICCTPCT in the last 72 hours. Sepsis Labs: Recent Labs  Lab 12/04/19 1802 12/04/19 2002 12/06/19 0308  12/07/19 0136  PROCALCITON  --   --  7.27 4.12  LATICACIDVEN 1.9 1.4 0.8  --     Recent Results (from the past 240 hour(s))  Blood Culture (routine x 2)     Status: None (Preliminary result)   Collection Time: 12/04/19  6:04 PM   Specimen: BLOOD RIGHT WRIST  Result Value Ref Range Status   Specimen Description BLOOD RIGHT WRIST  Final   Special Requests   Final    BOTTLES DRAWN AEROBIC AND ANAEROBIC Blood Culture adequate volume   Culture  Setup Time   Final    GRAM NEGATIVE RODS AEROBIC BOTTLE ONLY CRITICAL VALUE NOTED.  VALUE IS CONSISTENT WITH PREVIOUSLY REPORTED AND CALLED VALUE.    Culture   Final    GRAM NEGATIVE RODS IDENTIFICATION AND SUSCEPTIBILITIES TO FOLLOW Performed at [facility], [place] [address] [place], [place] [address]    Report Status PENDING  Incomplete  Blood Culture (routine x 2)     Status: Abnormal   Collection Time: 12/04/19  6:40 PM   Specimen: BLOOD RIGHT HAND  Result Value Ref Range Status   Specimen Description BLOOD RIGHT HAND  Final   Special Requests   Final    BOTTLES DRAWN AEROBIC AND ANAEROBIC Blood Culture adequate volume   Culture  Setup Time   Final    GRAM NEGATIVE RODS AEROBIC BOTTLE ONLY CRITICAL RESULT CALLED TO, READ BACK BY AND VERIFIED WITH: Ailene Rud [id number] AT [id number] BY CM Performed at [facility], [place] [address] [place], [place] [address]    Culture ESCHERICHIA COLI (A)  Final   Report Status 12/07/2019 FINAL  Final   Organism ID, Bacteria ESCHERICHIA COLI  Final      Susceptibility   Escherichia coli - MIC*    AMPICILLIN >=32 RESISTANT Resistant     CEFAZOLIN <=4 SENSITIVE Sensitive     CEFEPIME <=0.12 SENSITIVE Sensitive     CEFTAZIDIME <=1 SENSITIVE Sensitive     CEFTRIAXONE <=0.25 SENSITIVE Sensitive     CIPROFLOXACIN <=0.25 SENSITIVE Sensitive     GENTAMICIN <=1 SENSITIVE Sensitive     IMIPENEM <=  0.25 SENSITIVE Sensitive     TRIMETH/SULFA <=20 SENSITIVE Sensitive     AMPICILLIN/SULBACTAM 8  SENSITIVE Sensitive     PIP/TAZO <=4 SENSITIVE Sensitive     * ESCHERICHIA COLI  Blood Culture ID Panel (Reflexed)     Status: Abnormal   Collection Time: 12/04/19  6:40 PM  Result Value Ref Range Status   Enterococcus species NOT DETECTED NOT DETECTED Final   Listeria monocytogenes NOT DETECTED NOT DETECTED Final   Staphylococcus species NOT DETECTED NOT DETECTED Final   Staphylococcus aureus (BCID) NOT DETECTED NOT DETECTED Final   Streptococcus species NOT DETECTED NOT DETECTED Final   Streptococcus agalactiae NOT DETECTED NOT DETECTED Final   Streptococcus pneumoniae NOT DETECTED NOT DETECTED Final   Streptococcus pyogenes NOT DETECTED NOT DETECTED Final   Acinetobacter baumannii NOT DETECTED NOT DETECTED Final   Enterobacteriaceae species DETECTED (A) NOT DETECTED Final    Comment: Enterobacteriaceae represent a large family of gram-negative bacteria, not a single organism. CRITICAL RESULT CALLED TO, READ BACK BY AND VERIFIED WITH: PHARMD M [place] [id number] AT 2563 BY CM    Enterobacter cloacae complex NOT DETECTED NOT DETECTED Final   Escherichia coli DETECTED (A) NOT DETECTED Final    Comment: CRITICAL RESULT CALLED TO, READ BACK BY AND VERIFIED WITH: PHARMD M [place] [id number] AT 1337 BY CM    Klebsiella oxytoca NOT DETECTED NOT DETECTED Final   Klebsiella pneumoniae NOT DETECTED NOT DETECTED Final   Proteus species NOT DETECTED NOT DETECTED Final   Serratia marcescens NOT DETECTED NOT DETECTED Final   Carbapenem resistance NOT DETECTED NOT DETECTED Final   Haemophilus influenzae NOT DETECTED NOT DETECTED Final   Neisseria meningitidis NOT DETECTED NOT DETECTED Final   Pseudomonas aeruginosa NOT DETECTED NOT DETECTED Final   Candida albicans NOT DETECTED NOT DETECTED Final   Candida glabrata NOT DETECTED NOT DETECTED Final   Candida krusei NOT DETECTED NOT DETECTED Final   Candida parapsilosis NOT DETECTED NOT DETECTED Final   Candida tropicalis NOT DETECTED NOT DETECTED Final     Comment: Performed at [facility], [place] [address], [place], [place] [address]  SARS Coronavirus 2 by RT PCR (hospital order, performed in [facility] hospital lab) Nasopharyngeal Nasopharyngeal Swab     Status: None   Collection Time: 12/04/19  7:45 PM   Specimen: Nasopharyngeal Swab  Result Value Ref Range Status   SARS Coronavirus 2 NEGATIVE NEGATIVE Final    Comment: (NOTE) SARS-CoV-2 target nucleic acids are NOT DETECTED.  The SARS-CoV-2 RNA is generally detectable in upper and lower respiratory specimens during the acute phase of infection. The lowest concentration of SARS-CoV-2 viral copies this assay can detect is 250 copies / mL. A negative result does not preclude SARS-CoV-2 infection and should not be used as the sole basis for treatment or other patient management decisions.  A negative result may occur with improper specimen collection / handling, submission of specimen other than nasopharyngeal swab, presence of viral mutation(s) within the areas targeted by this assay, and inadequate number of viral copies (<250 copies / mL). A negative result must be combined with clinical observations, patient history, and epidemiological information.  Fact Sheet for Patients:   StrictlyIdeas.no  Fact Sheet for Healthcare Providers: BankingDealers.co.za  This test is not yet approved or  cleared by the [place] FDA and has been authorized for detection and/or diagnosis of SARS-CoV-2 by FDA under an Emergency Use Authorization (EUA).  This EUA will remain in effect (meaning this test can be  used) for the duration of the COVID-19 declaration under Section 564(b)(1) of the Act, 21 U.S.C. section 360bbb-3(b)(1), unless the authorization is terminated or revoked sooner.  Performed at [facility], [place] [address] [place], [place] [address]   Urine culture     Status: Abnormal (Preliminary result)   Collection Time:  12/04/19 11:00 PM   Specimen: In/Out Cath Urine  Result Value Ref Range Status   Specimen Description IN/OUT CATH URINE  Final   Special Requests NONE  Final   Culture (A)  Final    10,000 COLONIES/mL ESCHERICHIA COLI SUSCEPTIBILITIES TO FOLLOW Performed at [facility], [place] [address] [place], [place] [address]    Report Status PENDING  Incomplete  MRSA PCR Screening     Status: None   Collection Time: 12/05/19  3:46 AM   Specimen: Nasopharyngeal  Result Value Ref Range Status   MRSA by PCR NEGATIVE NEGATIVE Final    Comment:        The GeneXpert MRSA Assay (FDA approved for NASAL specimens only), is one component of a comprehensive MRSA colonization surveillance program. It is not intended to diagnose MRSA infection nor to guide or monitor treatment for MRSA infections. Performed at [facility], [place] [address], [place], [place] [address]          Radiology Studies: No results found.      Scheduled Meds:  amiodarone  100 mg Oral Daily   apixaban  2.5 mg Oral BID   azithromycin  500 mg Oral Daily   baclofen  10 mg Oral TID   benazepril  5 mg Oral Daily   cephALEXin  500 mg Oral Q6H   [START ON 12/08/2019] furosemide  40 mg Oral BH-q7a   gabapentin  200 mg Oral BID   levothyroxine  50 mcg Oral Daily   tamoxifen  10 mg Oral Daily   Continuous Infusions:   LOS: 3 days    Time spent: 35 mins,More than 50% of that time was spent in counseling and/or coordination of care.      Shelly Coss, MD [facility] P7/26/2021, 2:46 PM

## 2019-12-08 DIAGNOSIS — R6520 Severe sepsis without septic shock: Secondary | ICD-10-CM | POA: Diagnosis not present

## 2019-12-08 DIAGNOSIS — Z7189 Other specified counseling: Secondary | ICD-10-CM | POA: Diagnosis not present

## 2019-12-08 DIAGNOSIS — Z515 Encounter for palliative care: Secondary | ICD-10-CM

## 2019-12-08 DIAGNOSIS — J189 Pneumonia, unspecified organism: Secondary | ICD-10-CM | POA: Diagnosis not present

## 2019-12-08 DIAGNOSIS — A419 Sepsis, unspecified organism: Secondary | ICD-10-CM | POA: Diagnosis not present

## 2019-12-08 LAB — URINE CULTURE: Culture: 10000 — AB

## 2019-12-08 LAB — CULTURE, BLOOD (ROUTINE X 2): Special Requests: ADEQUATE

## 2019-12-08 NOTE — Evaluation (Signed)
Occupational Therapy Evaluation Patient Details Name: [name] MRN: [id number] DOB: [birth year] Today's Date: 12/08/2019    History of Present Illness 86 yo admitted with SOB, cough with sepsis and PNA. PMHx: breast CA, Afib, HTN, HLD, recent UTI, covid and covid vaccine   Clinical Impression   This 86 y/o female presents with the above. PTA pt residing in ALF, typically ambulatory with RW and performing basic ADL without assist. Limited to bed level eval today as pt adamantly refusing to attempt EOB despite encouragement/reassurance (as pt endorsing being fearful of falling). Pt incontinent of stool and requiring totalA for pericare at bed level, modA for rolling to L/R during completion of task. Pt with decreased insight and awareness of current situation and requiring frequent reminders for reason she cannot yet discharge to a facility. VSS throughout. Pt to benefit from continued acute OT services and currently recommend follow up OT services in SNF setting to progress pt towards her PLOF. Will follow.     Follow Up Recommendations  SNF    Equipment Recommendations  Other (comment);3 in 1 bedside commode (defer to next venue )           Precautions / Restrictions Precautions Precautions: Fall Restrictions Weight Bearing Restrictions: No      Mobility Bed Mobility Overal bed mobility: Needs Assistance Bed Mobility: Rolling Rolling: Mod assist         General bed mobility comments: rolling to L/R for pericare, HOB elevated end of session to upright, pt refusing to attempt EOB or attempts for bed in chair position   Transfers                 General transfer comment: unable to attempt today    Balance                                           ADL either performed or assessed with clinical judgement   ADL Overall ADL's : Needs assistance/impaired Eating/Feeding: Set up;Sitting   Grooming: Set up;Bed level;Wash/dry face   Upper Body  Bathing: Minimal assistance;Bed level   Lower Body Bathing: Total assistance;Bed level   Upper Body Dressing : Moderate assistance;Bed level   Lower Body Dressing: Total assistance;Bed level       Toileting- Clothing Manipulation and Hygiene: Total assistance;Bed level Toileting - Clothing Manipulation Details (indicate cue type and reason): pt with urine/stool incontinence and requiring totalA for pericare at bed level       General ADL Comments: pt adamantly refusing to attempt EOB activity stating she is fearful of falling (despite reassurance from OT)                         Pertinent Vitals/Pain Pain Assessment: No/denies pain     Hand Dominance Left   Extremity/Trunk Assessment Upper Extremity Assessment Upper Extremity Assessment: Generalized weakness   Lower Extremity Assessment Lower Extremity Assessment: Defer to PT evaluation   Cervical / Trunk Assessment Cervical / Trunk Assessment: Kyphotic   Communication Communication Communication: No difficulties   Cognition Arousal/Alertness: Awake/alert Behavior During Therapy: Anxious (emotionally labile) Overall Cognitive Status: No family/caregiver present to determine baseline cognitive functioning                                 General Comments: pt insistent  to leave today despite education/reassurance from both RN and OT of needing to wait for a bed availability at facility prior to discharge and process of doing so. pt was incontinent of stool and initially with no attempt to inform OT of needing to get cleaned up.   General Comments  VSS    Exercises     Shoulder Instructions      Home Living Family/patient expects to be discharged to:: Assisted living                             Home Equipment: Walker - 2 wheels          Prior Functioning/Environment Level of Independence: Independent with assistive device(s)        Comments: walks to dining room, bathes  and dresses on her own. Is normally up and in the hall at [facility] asking for her medicine per staff        OT Problem List: Decreased strength;Decreased range of motion;Decreased activity tolerance;Impaired balance (sitting and/or standing);Decreased cognition;Decreased safety awareness;Decreased knowledge of use of DME or AE;Impaired sensation      OT Treatment/Interventions: Self-care/ADL training;Therapeutic exercise;Energy conservation;DME and/or AE instruction;Therapeutic activities;Cognitive remediation/compensation;Patient/family education;Balance training    OT Goals(Current goals can be found in the care plan section) Acute Rehab OT Goals Patient Stated Goal: return home OT Goal Formulation: With patient Time For Goal Achievement: 12/22/19 Potential to Achieve Goals: Good  OT Frequency: Min 2X/week   Barriers to D/C:            Co-evaluation              AM-PAC OT "6 Clicks" Daily Activity     Outcome Measure Help from another person eating meals?: A Little Help from another person taking care of personal grooming?: A Little Help from another person toileting, which includes using toliet, bedpan, or urinal?: Total Help from another person bathing (including washing, rinsing, drying)?: A Lot Help from another person to put on and taking off regular upper body clothing?: A Lot Help from another person to put on and taking off regular lower body clothing?: Total 6 Click Score: 12   End of Session Nurse Communication: Mobility status  Activity Tolerance: Treatment limited secondary to agitation;Other (comment) (pt self-limiting ) Patient left: in bed;with call bell/phone within reach;with bed alarm set;with nursing/sitter in room  OT Visit Diagnosis: Other symptoms and signs involving cognitive function;Other abnormalities of gait and mobility (R26.89);Muscle weakness (generalized) (M62.81)                Time: 5956-3875 OT Time Calculation (min): 28 min Charges:  OT  General Charges $OT Visit: 1 Visit OT Evaluation $OT Eval Moderate Complexity: 1 Mod OT Treatments $Self Care/Home Management : 8-22 mins  Lou Cal, OT Acute Rehabilitation Services Pager [phone] Office [phone]   Raymondo Band 12/08/2019, 2:29 PM

## 2019-12-08 NOTE — NC FL2 (Signed)
[place] MEDICAID FL2 LEVEL OF CARE SCREENING TOOL     IDENTIFICATION  Patient Name: [name] Birthdate: 10/03/1930 Sex: female Admission Date (Current Location): 12/04/2019  [facility] [place] Number:  Herbalist and Address:  [facility], [address], [place], [place] [address]      Provider Number: [id number]  Attending Physician Name and Address:  Shelly Coss, MD  Relative Name and Phone Number:       Current Level of Care: Hospital Recommended Level of Care: [place] Prior Approval Number:    Date Approved/Denied:   PASRR Number: [id number]  Discharge Plan: SNF    Current Diagnoses: Patient Active Problem List   Diagnosis Date Noted  . Severe sepsis ([place]) 12/04/2019  . Educated about COVID-19 virus infection 05/16/2019  . Non-allergic rhinitis 05/01/2017  . Pressure injury of skin 04/23/2017  . Aspiration pneumonia ([place]) 04/22/2017  . Acute hypoxemic respiratory failure ([place]) 04/22/2017  . Right lower lobe pneumonia 03/27/2017  . Respiratory failure with hypoxia ([place]) 11/02/2015  . Depression 11/02/2015  . Anxiety 11/02/2015  . Hypertension 11/02/2015  . Paroxysmal atrial fibrillation ([place]) 11/02/2015  . Hypothyroidism 11/02/2015  . Chronic venous stasis dermatitis 11/02/2015  . Chronic pain 11/02/2015  . Breast cancer ([place]) 11/02/2015    Orientation RESPIRATION BLADDER Height & Weight     Self, Situation, Place, Time  O2 Incontinent, External catheter Weight: 137 lb 2 oz (62.2 kg) Height:  4\' 9"  (144.8 cm)  BEHAVIORAL SYMPTOMS/MOOD NEUROLOGICAL BOWEL NUTRITION STATUS      Continent Diet (See discharge summary)  AMBULATORY STATUS COMMUNICATION OF NEEDS Skin   Total Care Verbally Normal                       Personal Care Assistance Level of Assistance  Bathing, Feeding, Dressing Bathing Assistance: Maximum assistance Feeding assistance: Limited assistance Dressing  Assistance: Maximum assistance     Functional Limitations Info  Sight, Speech, Hearing Sight Info: Adequate Hearing Info: Adequate Speech Info: Adequate    SPECIAL CARE FACTORS FREQUENCY  PT (By licensed PT), OT (By licensed OT)     PT Frequency: 5x a week OT Frequency: 5x a week            Contractures Contractures Info: Not present    Additional Factors Info  Code Status, Allergies Code Status Info: DNR Allergies Info: Sulfa Antibiotics           Current Medications (12/08/2019):  This is the current hospital active medication list Current Facility-Administered Medications  Medication Dose Route Frequency Provider Last Rate Last Admin  . acetaminophen (TYLENOL) tablet 650 mg  650 mg Oral Q6H PRN Etta Quill, DO   650 mg at 12/07/19 2304  . amiodarone (PACERONE) tablet 100 mg  100 mg Oral Daily Jennette Kettle M, DO   100 mg at 12/08/19 1015  . apixaban (ELIQUIS) tablet 2.5 mg  2.5 mg Oral BID Jennette Kettle M, DO   2.5 mg at 12/08/19 1015  . baclofen (LIORESAL) tablet 10 mg  10 mg Oral TID Etta Quill, DO   10 mg at 12/08/19 1015  . benazepril (LOTENSIN) tablet 5 mg  5 mg Oral Daily Florencia Reasons, MD   5 mg at 12/08/19 1014  . cephALEXin (KEFLEX) capsule 500 mg  500 mg Oral Q6H Adhikari, Amrit, MD   500 mg at 12/08/19 1128  . furosemide (LASIX) tablet 40 mg  40 mg  Oral Georgiann Mohs, MD   40 mg at 12/08/19 0604  . gabapentin (NEURONTIN) capsule 200 mg  200 mg Oral BID Jennette Kettle M, DO   200 mg at 12/08/19 1015  . levothyroxine (SYNTHROID) tablet 50 mcg  50 mcg Oral Daily Etta Quill, DO   50 mcg at 12/08/19 0604  . tamoxifen (NOLVADEX) tablet 10 mg  10 mg Oral Daily Jennette Kettle M, DO   10 mg at 12/08/19 1014     Discharge Medications: Please see discharge summary for a list of discharge medications.  Relevant Imaging Results:  Relevant Lab Results:   Additional Information SSN [id number]  Neysa Hotter [place], [place]

## 2019-12-08 NOTE — Plan of Care (Signed)
°  Problem: Fluid Volume: Goal: Hemodynamic stability will improve Outcome: Progressing   Problem: Clinical Measurements: Goal: Diagnostic test results will improve Outcome: Progressing Goal: Signs and symptoms of infection will decrease Outcome: Progressing   Problem: Respiratory: Goal: Ability to maintain adequate ventilation will improve Outcome: Progressing   Problem: Education: Goal: Knowledge of General Education information will improve Description: Including pain rating scale, medication(s)/side effects and non-pharmacologic comfort measures Outcome: Progressing   Problem: Clinical Measurements: Goal: Ability to maintain clinical measurements within normal limits will improve Outcome: Progressing Goal: Will remain free from infection Outcome: Progressing Goal: Diagnostic test results will improve Outcome: Progressing Goal: Respiratory complications will improve Outcome: Progressing Goal: Cardiovascular complication will be avoided Outcome: Progressing   Problem: Activity: Goal: Risk for activity intolerance will decrease Outcome: Progressing   Problem: Nutrition: Goal: Adequate nutrition will be maintained Outcome: Progressing   Problem: Coping: Goal: Level of anxiety will decrease Outcome: Progressing   Problem: Elimination: Goal: Will not experience complications related to bowel motility Outcome: Progressing Goal: Will not experience complications related to urinary retention Outcome: Progressing   Problem: Pain Managment: Goal: General experience of comfort will improve Outcome: Progressing   Problem: Safety: Goal: Ability to remain free from injury will improve Outcome: Progressing   Problem: Skin Integrity: Goal: Risk for impaired skin integrity will decrease Outcome: Progressing

## 2019-12-08 NOTE — Consult Note (Addendum)
Consultation Note Date: 12/08/2019   Patient Name: [name]  DOB: [birth year]  MRN: [id number]  Age / Sex: 86 y.o., female  PCP: [name], MD Referring Physician: Shelly Coss, MD  Reason for Consultation: Establishing goals of care  HPI/Patient Profile: 86 y.o. female  with past medical history of atrial fibrillation on Eliquis, HTN, breast cancer, early dementia admitted on 12/04/2019 from [facility] with fever, shortness of breath related to sepsis bacteremia and right lower lobe pneumonia.   Clinical Assessment and Goals of Care: I met today with Ms. Villalpando. She is alert and *mostly* oriented as she knows who and where she is and knows her health situation on a simple level and agrees to transition to SNF rehab. She does appear forgetful and with altered judgement and insight.   I discussed with Ms. Goetze how she is feeling and her goals of care. She tells me that she is feeling better but complains of some pain in left knee and shoulders (she says this is nerve pain and chronic but also tolerable). She has no complaints and is able to show me how to call the nurse if she needs anything. She expresses that she is ready to get out of the hospital. I asked her if she was glad she came and she does not answer. I asked her if she would want to come back to the hospital is she were to get sick again and she tells me "I'm not going to get sick again." I told her that she could get sick with infection or fall or anything could happen. I explained that something could happen and we should just be prepared and want to follow her wishes but she is unable to answer this question of discuss as she does not understand.   I did call and leave voicemail for her son but I have not discussed with him. At current time I do not feel that she is eligible for hospice care. She is eating well and infection is  improving. There is the chance that this illness may continue to progress her dementia and declining functional status could lead to hospice eligibility in the near future. I will await return call from son. At this time I am recommending to continue plan for SNF rehab placement and may benefit from outpatient palliative to follow there and continue discussions.   All questions/concerns addressed. Emotional support provided.    Primary Decision Maker NEXT OF KIN son although patient can participate I believe she does not have the insight to make complex decisions for herself.    SUMMARY OF RECOMMENDATIONS   - Recommend SNF rehab with outpatient palliative to follow.   Code Status/Advance Care Planning:  DNR   Symptom Management:   Per attending.   Palliative Prophylaxis:   Bowel Regimen, Delirium Protocol, Frequent Pain Assessment and Turn Reposition   Psycho-social/Spiritual:   Desire for further Chaplaincy support:no  Additional Recommendations: Caregiving  Support/Resources  Prognosis:   Unable to determine  Discharge Planning: Skilled Nursing  Facility for rehab with Palliative care service follow-up      Primary Diagnoses: Present on Admission: . Acute hypoxemic respiratory failure ([place]) . Right lower lobe pneumonia . Breast cancer ([place]) . Paroxysmal atrial fibrillation ([facility]) . Hypertension   I have reviewed the medical record, interviewed the patient and family, and examined the patient. The following aspects are pertinent.  Past Medical History:  Diagnosis Date  . Anxiety   . Atrial fibrillation ([place])   . Breast CA ([place])   . Constipation   . Depression   . Disorder of bone density and structure, unspecified   . Fibromyalgia   . Hyperkalemia   . Hyperlipemia   . Hypertension   . Impaired fasting glucose   . Retention of urine, unspecified   . Scoliosis   . Thyroid disease    Social History   Socioeconomic History  . Marital status: Divorced     Spouse name: Not on file  . Number of children: Not on file  . Years of education: Not on file  . Highest education level: Not on file  Occupational History  . Not on file  Tobacco Use  . Smoking status: Never Smoker  . Smokeless tobacco: Never Used  Substance and Sexual Activity  . Alcohol use: No  . Drug use: No  . Sexual activity: Not Currently  Other Topics Concern  . Not on file  Social History Narrative  . Not on file   Social Determinants of Health   Financial Resource Strain:   . Difficulty of Paying Living Expenses:   Food Insecurity:   . Worried About Charity fundraiser in the Last Year:   . Arboriculturist in the Last Year:   Transportation Needs:   . Film/video editor (Medical):   Marland Kitchen Lack of Transportation (Non-Medical):   Physical Activity:   . Days of Exercise per Week:   . Minutes of Exercise per Session:   Stress:   . Feeling of Stress :   Social Connections:   . Frequency of Communication with Friends and Family:   . Frequency of Social Gatherings with Friends and Family:   . Attends Religious Services:   . Active Member of Clubs or Organizations:   . Attends Archivist Meetings:   Marland Kitchen Marital Status:    Family History  Problem Relation Age of Onset  . Hypertension Other    Scheduled Meds: . amiodarone  100 mg Oral Daily  . apixaban  2.5 mg Oral BID  . baclofen  10 mg Oral TID  . benazepril  5 mg Oral Daily  . cephALEXin  500 mg Oral Q6H  . furosemide  40 mg Oral BH-q7a  . gabapentin  200 mg Oral BID  . levothyroxine  50 mcg Oral Daily  . tamoxifen  10 mg Oral Daily   Continuous Infusions: PRN Meds:.acetaminophen Allergies  Allergen Reactions  . Sulfa Antibiotics Other (See Comments)    Per mar   Review of Systems  Constitutional: Positive for activity change, appetite change and fatigue.  Respiratory: Negative for shortness of breath.   Neurological: Positive for weakness.    Physical Exam Vitals and nursing note  reviewed.  Constitutional:      General: She is not in acute distress.    Appearance: She is ill-appearing.     Comments: frail, elderly  Cardiovascular:     Rate and Rhythm: Normal rate.  Pulmonary:     Effort: Pulmonary effort is normal. No  tachypnea, accessory muscle usage or respiratory distress.  Abdominal:     General: Abdomen is flat.     Palpations: Abdomen is soft.  Neurological:     Mental Status: She is alert and oriented to person, place, and time.  Psychiatric:        Cognition and Memory: Memory is impaired.     Comments: Altered insight and judgement     Vital Signs: BP (!) 137/78 (BP Location: Left Arm)   Pulse 80   Temp 98.2 F (36.8 C) (Oral)   Resp 17   Ht 4' 9"  (1.448 m)   Wt 62.2 kg   SpO2 97%   BMI 29.67 kg/m  Pain Scale: 0-10 POSS *See Group Information*: 1-Acceptable,Awake and alert Pain Score: 0-No pain   SpO2: SpO2: 97 % O2 Device:SpO2: 97 % O2 Flow Rate: .O2 Flow Rate (L/min): 2 L/min  IO: Intake/output summary:   Intake/Output Summary (Last 24 hours) at 12/08/2019 1622 Last data filed at 12/08/2019 0751 Gross per 24 hour  Intake 480 ml  Output 1475 ml  Net -995 ml    LBM: Last BM Date: 12/07/19 Baseline Weight: Weight: 61.7 kg Most recent weight: Weight: 62.2 kg     Palliative Assessment/Data:     Time In: 1230 Time Out: 1310 Time Total: 40 MIN Greater than 50%  of this time was spent counseling and coordinating care related to the above assessment and plan.  Signed by: Vinie Sill, NP Palliative Medicine Team Pager # [phone] (M-F 8a-5p) Team Phone # [phone] (Nights/Weekends)

## 2019-12-08 NOTE — Progress Notes (Signed)
PROGRESS NOTE    [name]  VZC:[id number] DOB: July 10, 1930 DOA: 12/04/2019 PCP: [name], MD   Brief Narrative:  Patient is 86 year old female with history of proximal A. fib on Eliquis, hypertension, breast cancer on tamoxifen, early dementia who presents with complaints of productive cough with fever, shortness of breath from [place] assisted [facility] facility.  She has history of multiple UTIs.  On presentation she was hypotensive, febrile, had leukocytosis and was found to have opacity in the right base suspicious for pneumonia.  She was requiring 4 L of oxygen per minute.  She was started on broad-spectrum antibiotics.  Blood cultures, urine culture showed pansensitive E. coli.  Currently she is hemodynamically stable.  PT/OT recommended skilled nursing facility.  Family also requested palliative care consultation for goals of care/potential hospice.  Patient is medically stable for discharge to skilled nursing facility as soon as bed is available.  Assessment & Plan:   Principal Problem:   Severe sepsis ([place]) Active Problems:   Hypertension   Paroxysmal atrial fibrillation (HCC)   Breast cancer (HCC)   Right lower lobe pneumonia   Acute hypoxemic respiratory failure (HCC)   Severe Sepsis/bacteremia: Presented with fever, leukocytosis, hypotension.  Patient met criteria for severe sepsis on admission given SIRS criteria of respiratory rate  more than 20, temperature 101.5 and evidence of organ dysfunction including hypotension and respiratory failure. Chest x-ray showed right basilar pneumonia.  Urine culture, blood culture showed pansensitive E. coli.  Currently she is hemodynamically stable.  Antibiotics changed to oral.  Acute respiratory failure with hypoxia/right lower lobe pneumonia: Currently respiratory status is stable. On 2 L /min. Will  Try to wean and monitor on room air.  Continue current antibiotics.    Proximal A. fib: Currently rate is well controlled.  On  Eliquis for anticoagulation.  On amiodarone.  Currently in normal sinus rhythm.  Hypertension: Hypertensive on presentation.  Currently blood pressure medications on hold.  History of breast cancer: On tamoxifen.  BRCA positive  Hypothyroidism: Continue Synthyroid  Early dementia: Currently alert and awake and oriented but seems confused on and off as per the son.  Debility/deconditioning: ALF resident.  PT recommended skilled facility on discharge.  Goals of care: DNR.  I had a long conversation with the son.  He says his mother is stubborn, noncompliance and uncooperative with the medical advice.  He requests palliative  care evaluation for possible hospice approach.      DVT prophylaxis: Eliquis Code Status: DNR Family Communication: Discussed with son on phone Status is: Inpatient  Remains inpatient appropriate because:Unsafe d/c plan   Dispo: The patient is from: ALF              Anticipated d/c is to: SNF              Anticipated d/c date is: As soon as bed is available              Patient currently is medically stable to d/c.    Consultants: None  Procedures:None  Antimicrobials:  Anti-infectives (From admission, onward)   Start     Dose/Rate Route Frequency Ordered Stop   12/07/19 1400  cephALEXin (KEFLEX) capsule 500 mg     Discontinue     500 mg Oral Every 6 hours 12/07/19 1351 12/13/19 1159   12/07/19 1400  azithromycin (ZITHROMAX) tablet 500 mg        500 mg Oral Daily 12/07/19 1351 12/08/19 1015   12/05/19 2000  vancomycin (VANCOREADY)  IVPB 750 mg/150 mL  Status:  Discontinued        750 mg 150 mL/hr over 60 Minutes Intravenous Every 24 hours 12/04/19 2009 12/04/19 2133   12/05/19 1000  cefTRIAXone (ROCEPHIN) 2 g in sodium chloride 0.9 % 100 mL IVPB  Status:  Discontinued        2 g 200 mL/hr over 30 Minutes Intravenous Every 24 hours 12/04/19 2133 12/07/19 1350   12/04/19 2200  ceFEPIme (MAXIPIME) 2 g in sodium chloride 0.9 % 100 mL IVPB  Status:   Discontinued        2 g 200 mL/hr over 30 Minutes Intravenous Every 12 hours 12/04/19 2009 12/04/19 2133   12/04/19 2145  azithromycin (ZITHROMAX) 500 mg in sodium chloride 0.9 % 250 mL IVPB  Status:  Discontinued        500 mg 250 mL/hr over 60 Minutes Intravenous Every 24 hours 12/04/19 2133 12/07/19 1350   12/04/19 1815  ceFEPIme (MAXIPIME) 2 g in sodium chloride 0.9 % 100 mL IVPB        2 g 200 mL/hr over 30 Minutes Intravenous  Once 12/04/19 1802 12/04/19 1948   12/04/19 1815  metroNIDAZOLE (FLAGYL) IVPB 500 mg        500 mg 100 mL/hr over 60 Minutes Intravenous  Once 12/04/19 1802 12/04/19 2002   12/04/19 1815  vancomycin (VANCOCIN) IVPB 1000 mg/200 mL premix        1,000 mg 200 mL/hr over 60 Minutes Intravenous  Once 12/04/19 1802 12/04/19 2105      Subjective:  Patient seen and examined at the bedside this morning.  Hemodynamically stable.  Comfortable.  Not in any kind of respiratory distress.  Denies any cough.   Objective: Vitals:   12/08/19 0611 12/08/19 0700 12/08/19 1000 12/08/19 1035  BP:  101/75 (!) 151/81 (!) 137/78  Pulse:  79 82 80  Resp:  20 20 17   Temp:  98.2 F (36.8 C)  98.2 F (36.8 C)  TempSrc:  Oral  Oral  SpO2:  96% 97% 97%  Weight: 62.2 kg     Height:        Intake/Output Summary (Last 24 hours) at 12/08/2019 1259 Last data filed at 12/08/2019 0751 Gross per 24 hour  Intake 720 ml  Output 1475 ml  Net -755 ml   Filed Weights   12/06/19 0500 12/07/19 0500 12/08/19 0611  Weight: 62.3 kg 61.3 kg 62.2 kg    Examination:  General exam: Elderly deconditioned, debilitated female Respiratory system: Bilateral equal air entry, normal vesicular breath sounds, no wheezes or crackles  Cardiovascular system: S1 & S2 heard, RRR. No JVD, murmurs, rubs, gallops or clicks. Gastrointestinal system: Abdomen is mildly distended, soft and nontender. No organomegaly or masses felt. Normal bowel sounds heard. Central nervous system: Alert and oriented. No  focal neurological deficits. Extremities: Trace lower extremity  edema, no clubbing ,no cyanosis, venous stasis changes on bilateral lower extremities Skin: No rashes, lesions or ulcers,no icterus ,no pallor   Data Reviewed: I have personally reviewed following labs and imaging studies  CBC: Recent Labs  Lab 12/04/19 1802 12/05/19 0351 12/06/19 0308 12/07/19 0136  WBC 17.0* 15.6* 12.0* 12.0*  NEUTROABS 14.6*  --  9.9* 9.6*  HGB 10.3* 10.9* 9.5* 10.2*  HCT 33.7* 36.1 30.7* 34.2*  MCV 90.3 91.9 90.6 90.5  PLT 160 141* 134* 902   Basic Metabolic Panel: Recent Labs  Lab 12/04/19 1802 12/05/19 0351 12/06/19 0308 12/07/19 0136  NA 138 143  139 140  K 3.5 3.7 2.7* 4.0  CL 104 111 107 108  CO2 22 20* 25 24  GLUCOSE 219* 124* 120* 125*  BUN 25* 20 13 12   CREATININE 0.81 0.74 0.53 0.55  CALCIUM 8.3* 7.9* 7.5* 8.2*  MG  --   --  1.9 1.8   GFR: Estimated Creatinine Clearance: 38.3 mL/min (by C-G formula based on SCr of 0.55 mg/dL). Liver Function Tests: Recent Labs  Lab 12/04/19 1802  AST 13*  ALT 10  ALKPHOS 35*  BILITOT 0.7  PROT 5.8*  ALBUMIN 3.1*   No results for input(s): LIPASE, AMYLASE in the last 168 hours. No results for input(s): AMMONIA in the last 168 hours. Coagulation Profile: Recent Labs  Lab 12/04/19 1802  INR 1.5*   Cardiac Enzymes: No results for input(s): CKTOTAL, CKMB, CKMBINDEX, TROPONINI in the last 168 hours. BNP (last 3 results) No results for input(s): PROBNP in the last 8760 hours. HbA1C: No results for input(s): HGBA1C in the last 72 hours. CBG: No results for input(s): GLUCAP in the last 168 hours. Lipid Profile: No results for input(s): CHOL, HDL, LDLCALC, TRIG, CHOLHDL, LDLDIRECT in the last 72 hours. Thyroid Function Tests: No results for input(s): TSH, T4TOTAL, FREET4, T3FREE, THYROIDAB in the last 72 hours. Anemia Panel: No results for input(s): VITAMINB12, FOLATE, FERRITIN, TIBC, IRON, RETICCTPCT in the last 72  hours. Sepsis Labs: Recent Labs  Lab 12/04/19 1802 12/04/19 2002 12/06/19 0308 12/07/19 0136  PROCALCITON  --   --  7.27 4.12  LATICACIDVEN 1.9 1.4 0.8  --     Recent Results (from the past 240 hour(s))  Blood Culture (routine x 2)     Status: Abnormal   Collection Time: 12/04/19  6:04 PM   Specimen: BLOOD RIGHT WRIST  Result Value Ref Range Status   Specimen Description BLOOD RIGHT WRIST  Final   Special Requests   Final    BOTTLES DRAWN AEROBIC AND ANAEROBIC Blood Culture adequate volume   Culture  Setup Time   Final    GRAM NEGATIVE RODS AEROBIC BOTTLE ONLY CRITICAL VALUE NOTED.  VALUE IS CONSISTENT WITH PREVIOUSLY REPORTED AND CALLED VALUE.    Culture (A)  Final    ESCHERICHIA COLI SUSCEPTIBILITIES PERFORMED ON PREVIOUS CULTURE WITHIN THE LAST 5 DAYS. Performed at [facility], [place] [address], [place], [place] [address]    Report Status 12/08/2019 FINAL  Final  Blood Culture (routine x 2)     Status: Abnormal   Collection Time: 12/04/19  6:40 PM   Specimen: BLOOD RIGHT HAND  Result Value Ref Range Status   Specimen Description BLOOD RIGHT HAND  Final   Special Requests   Final    BOTTLES DRAWN AEROBIC AND ANAEROBIC Blood Culture adequate volume   Culture  Setup Time   Final    GRAM NEGATIVE RODS AEROBIC BOTTLE ONLY CRITICAL RESULT CALLED TO, READ BACK BY AND VERIFIED WITH: Ailene Rud [id number] AT 6301 BY CM Performed at [facility], [place] [address] [place], [place] [address]    Culture ESCHERICHIA COLI (A)  Final   Report Status 12/07/2019 FINAL  Final   Organism ID, Bacteria ESCHERICHIA COLI  Final      Susceptibility   Escherichia coli - MIC*    AMPICILLIN >=32 RESISTANT Resistant     CEFAZOLIN <=4 SENSITIVE Sensitive     CEFEPIME <=0.12 SENSITIVE Sensitive     CEFTAZIDIME <=1 SENSITIVE Sensitive     CEFTRIAXONE <=0.25 SENSITIVE Sensitive  CIPROFLOXACIN <=0.25 SENSITIVE Sensitive     GENTAMICIN <=1 SENSITIVE Sensitive     IMIPENEM <=0.25  SENSITIVE Sensitive     TRIMETH/SULFA <=20 SENSITIVE Sensitive     AMPICILLIN/SULBACTAM 8 SENSITIVE Sensitive     PIP/TAZO <=4 SENSITIVE Sensitive     * ESCHERICHIA COLI  Blood Culture ID Panel (Reflexed)     Status: Abnormal   Collection Time: 12/04/19  6:40 PM  Result Value Ref Range Status   Enterococcus species NOT DETECTED NOT DETECTED Final   Listeria monocytogenes NOT DETECTED NOT DETECTED Final   Staphylococcus species NOT DETECTED NOT DETECTED Final   Staphylococcus aureus (BCID) NOT DETECTED NOT DETECTED Final   Streptococcus species NOT DETECTED NOT DETECTED Final   Streptococcus agalactiae NOT DETECTED NOT DETECTED Final   Streptococcus pneumoniae NOT DETECTED NOT DETECTED Final   Streptococcus pyogenes NOT DETECTED NOT DETECTED Final   Acinetobacter baumannii NOT DETECTED NOT DETECTED Final   Enterobacteriaceae species DETECTED (A) NOT DETECTED Final    Comment: Enterobacteriaceae represent a large family of gram-negative bacteria, not a single organism. CRITICAL RESULT CALLED TO, READ BACK BY AND VERIFIED WITH: PHARMD M D'Hanis [id number] AT 2330 BY CM    Enterobacter cloacae complex NOT DETECTED NOT DETECTED Final   Escherichia coli DETECTED (A) NOT DETECTED Final    Comment: CRITICAL RESULT CALLED TO, READ BACK BY AND VERIFIED WITH: PHARMD M Sterling [id number] AT 1337 BY CM    Klebsiella oxytoca NOT DETECTED NOT DETECTED Final   Klebsiella pneumoniae NOT DETECTED NOT DETECTED Final   Proteus species NOT DETECTED NOT DETECTED Final   Serratia marcescens NOT DETECTED NOT DETECTED Final   Carbapenem resistance NOT DETECTED NOT DETECTED Final   Haemophilus influenzae NOT DETECTED NOT DETECTED Final   Neisseria meningitidis NOT DETECTED NOT DETECTED Final   Pseudomonas aeruginosa NOT DETECTED NOT DETECTED Final   Candida albicans NOT DETECTED NOT DETECTED Final   Candida glabrata NOT DETECTED NOT DETECTED Final   Candida krusei NOT DETECTED NOT DETECTED Final   Candida  parapsilosis NOT DETECTED NOT DETECTED Final   Candida tropicalis NOT DETECTED NOT DETECTED Final    Comment: Performed at [facility], [place] [address] [place], [place] [address]  SARS Coronavirus 2 by RT PCR (hospital order, performed in [facility] hospital lab) Nasopharyngeal Nasopharyngeal Swab     Status: None   Collection Time: 12/04/19  7:45 PM   Specimen: Nasopharyngeal Swab  Result Value Ref Range Status   SARS Coronavirus 2 NEGATIVE NEGATIVE Final    Comment: (NOTE) SARS-CoV-2 target nucleic acids are NOT DETECTED.  The SARS-CoV-2 RNA is generally detectable in upper and lower respiratory specimens during the acute phase of infection. The lowest concentration of SARS-CoV-2 viral copies this assay can detect is 250 copies / mL. A negative result does not preclude SARS-CoV-2 infection and should not be used as the sole basis for treatment or other patient management decisions.  A negative result may occur with improper specimen collection / handling, submission of specimen other than nasopharyngeal swab, presence of viral mutation(s) within the areas targeted by this assay, and inadequate number of viral copies (<250 copies / mL). A negative result must be combined with clinical observations, patient history, and epidemiological information.  Fact Sheet for Patients:   StrictlyIdeas.no  Fact Sheet for Healthcare Providers: BankingDealers.co.za  This test is not yet approved or  cleared by the [place] FDA and has been authorized for detection and/or diagnosis of SARS-CoV-2 by FDA under  an Emergency Use Authorization (EUA).  This EUA will remain in effect (meaning this test can be used) for the duration of the COVID-19 declaration under Section 564(b)(1) of the Act, 21 U.S.C. section 360bbb-3(b)(1), unless the authorization is terminated or revoked sooner.  Performed at [facility], [place] [address],  [place], [place] [address]   Urine culture     Status: Abnormal   Collection Time: 12/04/19 11:00 PM   Specimen: In/Out Cath Urine  Result Value Ref Range Status   Specimen Description IN/OUT CATH URINE  Final   Special Requests   Final    NONE Performed at [facility], [place] [address] [place], [place] [address]    Culture 10,000 COLONIES/mL ESCHERICHIA COLI (A)  Final   Report Status 12/08/2019 FINAL  Final   Organism ID, Bacteria ESCHERICHIA COLI (A)  Final      Susceptibility   Escherichia coli - MIC*    AMPICILLIN >=32 RESISTANT Resistant     CEFAZOLIN <=4 SENSITIVE Sensitive     CEFTRIAXONE <=0.25 SENSITIVE Sensitive     CIPROFLOXACIN <=0.25 SENSITIVE Sensitive     GENTAMICIN <=1 SENSITIVE Sensitive     IMIPENEM <=0.25 SENSITIVE Sensitive     NITROFURANTOIN <=16 SENSITIVE Sensitive     TRIMETH/SULFA <=20 SENSITIVE Sensitive     AMPICILLIN/SULBACTAM 16 INTERMEDIATE Intermediate     PIP/TAZO <=4 SENSITIVE Sensitive     * 10,000 COLONIES/mL ESCHERICHIA COLI  MRSA PCR Screening     Status: None   Collection Time: 12/05/19  3:46 AM   Specimen: Nasopharyngeal  Result Value Ref Range Status   MRSA by PCR NEGATIVE NEGATIVE Final    Comment:        The GeneXpert MRSA Assay (FDA approved for NASAL specimens only), is one component of a comprehensive MRSA colonization surveillance program. It is not intended to diagnose MRSA infection nor to guide or monitor treatment for MRSA infections. Performed at [facility], [place] [address] [place], [place] [address]          Radiology Studies: No results found.      Scheduled Meds: . amiodarone  100 mg Oral Daily  . apixaban  2.5 mg Oral BID  . baclofen  10 mg Oral TID  . benazepril  5 mg Oral Daily  . cephALEXin  500 mg Oral Q6H  . furosemide  40 mg Oral BH-q7a  . gabapentin  200 mg Oral BID  . levothyroxine  50 mcg Oral Daily  . tamoxifen  10 mg Oral Daily   Continuous Infusions:   LOS: 4 days     Time spent: 35 mins,More than 50% of that time was spent in counseling and/or coordination of care.      Shelly Coss, MD [facility] P7/27/2021, 12:59 PM

## 2019-12-08 NOTE — TOC Transition Note (Signed)
Transition of Care [facility]) - CM/SW Discharge Note   Patient Details  Name: [name] MRN: [id number] Date of Birth: Nov 01, 1930  Transition of Care [facility]) CM/SW Contact:  Jacquelynn Cree Phone Number: 12/08/2019, 2:26 PM   Clinical Narrative:     CSW met with patient to discuss PT recommendation for short-term SNF placement. Patient expressed understanding and is in agreement. CSW explained insurance authorization process.  CSW attempted to contact patient's relative [name], awaiting a call back. TOC team will continue to follow.       Patient Goals and CMS Choice        Discharge Placement                       Discharge Plan and Services                                     Social Determinants of Health (SDOH) Interventions     Readmission Downs Interventions No flowsheet data found.

## 2019-12-08 NOTE — TOC Initial Note (Signed)
Transition of Care [facility]) - Initial/Assessment Note    Patient Details  Name: [name] MRN: [id number] Date of Birth: 03-Mar-1931  [facility] of Care [facility]) CM/SW Contact:    Jacquelynn Cree Phone Number: 12/08/2019, 9:27 AM  Clinical Narrative:                 Late Entry CSW met with patient bedside to discuss PT recommendation of SNF placement at discharge. Patient appeared lethargic and kept stating she wants to go home. CSW asked about family involvement, patient provided permission for her relative [name] to be contacted. CSW agreed to follow-up with patient regarding discharge plan.        Patient Goals and CMS Choice        Expected Discharge Plan and Services                                                Prior Living Arrangements/Services                       Activities of Daily Living      Permission Sought/Granted                  Emotional Assessment              Admission diagnosis:  Right lower lobe pneumonia [J18.9] Sepsis, due to unspecified organism, unspecified whether acute organ dysfunction present [facility]) [A41.9] Patient Active Problem List   Diagnosis Date Noted  . Severe sepsis ([place]) 12/04/2019  . Educated about COVID-19 virus infection 05/16/2019  . Non-allergic rhinitis 05/01/2017  . Pressure injury of skin 04/23/2017  . Aspiration pneumonia ([place]) 04/22/2017  . Acute hypoxemic respiratory failure ([place]) 04/22/2017  . Right lower lobe pneumonia 03/27/2017  . Respiratory failure with hypoxia ([place]) 11/02/2015  . Depression 11/02/2015  . Anxiety 11/02/2015  . Hypertension 11/02/2015  . Paroxysmal atrial fibrillation ([place]) 11/02/2015  . Hypothyroidism 11/02/2015  . Chronic venous stasis dermatitis 11/02/2015  . Chronic pain 11/02/2015  . Breast cancer ([place]) 11/02/2015   PCP:  [name], MD Pharmacy:  No Pharmacies Listed    Social Determinants of Health (SDOH) Interventions     Readmission Risk Interventions No flowsheet data found.

## 2019-12-09 DIAGNOSIS — R6520 Severe sepsis without septic shock: Secondary | ICD-10-CM | POA: Diagnosis not present

## 2019-12-09 DIAGNOSIS — Z515 Encounter for palliative care: Secondary | ICD-10-CM

## 2019-12-09 DIAGNOSIS — A419 Sepsis, unspecified organism: Secondary | ICD-10-CM | POA: Diagnosis not present

## 2019-12-09 LAB — CBC WITH DIFFERENTIAL/PLATELET
Abs Immature Granulocytes: 0.08 10*3/uL — ABNORMAL HIGH (ref 0.00–0.07)
Basophils Absolute: 0 10*3/uL (ref 0.0–0.1)
Basophils Relative: 0 %
Eosinophils Absolute: 0.3 10*3/uL (ref 0.0–0.5)
Eosinophils Relative: 3 %
HCT: 36.4 % (ref 36.0–46.0)
Hemoglobin: 11.5 g/dL — ABNORMAL LOW (ref 12.0–15.0)
Immature Granulocytes: 1 %
Lymphocytes Relative: 13 %
Lymphs Abs: 1.3 10*3/uL (ref 0.7–4.0)
MCH: 27.9 pg (ref 26.0–34.0)
MCHC: 31.6 g/dL (ref 30.0–36.0)
MCV: 88.3 fL (ref 80.0–100.0)
Monocytes Absolute: 1.2 10*3/uL — ABNORMAL HIGH (ref 0.1–1.0)
Monocytes Relative: 12 %
Neutro Abs: 7.3 10*3/uL (ref 1.7–7.7)
Neutrophils Relative %: 71 %
Platelets: 200 10*3/uL (ref 150–400)
RBC: 4.12 MIL/uL (ref 3.87–5.11)
RDW: 14.5 % (ref 11.5–15.5)
WBC: 10.2 10*3/uL (ref 4.0–10.5)
nRBC: 0 % (ref 0.0–0.2)

## 2019-12-09 LAB — BASIC METABOLIC PANEL
Anion gap: 11 (ref 5–15)
BUN: 17 mg/dL (ref 8–23)
CO2: 26 mmol/L (ref 22–32)
Calcium: 8.6 mg/dL — ABNORMAL LOW (ref 8.9–10.3)
Chloride: 102 mmol/L (ref 98–111)
Creatinine, Ser: 0.53 mg/dL (ref 0.44–1.00)
GFR calc Af Amer: >60 mL/min (ref >60)
GFR calc non Af Amer: >60 mL/min (ref >60)
Glucose, Bld: 111 mg/dL — ABNORMAL HIGH (ref 70–99)
Potassium: 3.4 mmol/L — ABNORMAL LOW (ref 3.5–5.1)
Sodium: 139 mmol/L (ref 135–145)

## 2019-12-09 MED ORDER — POTASSIUM CHLORIDE CRYS ER 20 MEQ PO TBCR
40.0000 meq | EXTENDED_RELEASE_TABLET | Freq: Once | ORAL | Status: AC
  Administered 2019-12-09: 40 meq via ORAL
  Filled 2019-12-09: qty 2

## 2019-12-09 NOTE — Plan of Care (Signed)
  Problem: Fluid Volume: Goal: Hemodynamic stability will improve Outcome: Progressing   Problem: Clinical Measurements: Goal: Signs and symptoms of infection will decrease Outcome: Progressing   Problem: Education: Goal: Knowledge of General Education information will improve Description: Including pain rating scale, medication(s)/side effects and non-pharmacologic comfort measures Outcome: Progressing   Problem: Clinical Measurements: Goal: Ability to maintain clinical measurements within normal limits will improve Outcome: Progressing   Problem: Clinical Measurements: Goal: Diagnostic test results will improve Outcome: Progressing   Problem: Clinical Measurements: Goal: Cardiovascular complication will be avoided Outcome: Progressing   Problem: Activity: Goal: Risk for activity intolerance will decrease Outcome: Progressing   Problem: Nutrition: Goal: Adequate nutrition will be maintained Outcome: Progressing   Problem: Coping: Goal: Level of anxiety will decrease Outcome: Progressing   Problem: Pain Managment: Goal: General experience of comfort will improve Outcome: Progressing   Problem: Skin Integrity: Goal: Risk for impaired skin integrity will decrease Outcome: Progressing

## 2019-12-09 NOTE — Plan of Care (Signed)
  Problem: Fluid Volume: Goal: Hemodynamic stability will improve Outcome: Progressing   Problem: Clinical Measurements: Goal: Diagnostic test results will improve Outcome: Progressing Goal: Signs and symptoms of infection will decrease Outcome: Progressing   Problem: Respiratory: Goal: Ability to maintain adequate ventilation will improve Outcome: Progressing   Problem: Education: Goal: Knowledge of General Education information will improve Description: Including pain rating scale, medication(s)/side effects and non-pharmacologic comfort measures Outcome: Progressing   Problem: Clinical Measurements: Goal: Ability to maintain clinical measurements within normal limits will improve Outcome: Progressing Goal: Will remain free from infection Outcome: Progressing Goal: Diagnostic test results will improve Outcome: Progressing Goal: Respiratory complications will improve Outcome: Progressing Goal: Cardiovascular complication will be avoided Outcome: Progressing   Problem: Activity: Goal: Risk for activity intolerance will decrease Outcome: Progressing   Problem: Nutrition: Goal: Adequate nutrition will be maintained Outcome: Progressing   Problem: Coping: Goal: Level of anxiety will decrease Outcome: Progressing   Problem: Elimination: Goal: Will not experience complications related to bowel motility Outcome: Progressing Goal: Will not experience complications related to urinary retention Outcome: Progressing   Problem: Pain Managment: Goal: General experience of comfort will improve Outcome: Progressing   Problem: Safety: Goal: Ability to remain free from injury will improve Outcome: Progressing   Problem: Skin Integrity: Goal: Risk for impaired skin integrity will decrease Outcome: Progressing

## 2019-12-09 NOTE — Progress Notes (Signed)
PROGRESS NOTE    [name]  JFH:[id number] DOB: [birth year] DOA: 12/04/2019 PCP: [name], MD   Brief Narrative:  Patient is 86 year old female with history of proximal A. fib on Eliquis, hypertension, breast cancer on tamoxifen, early dementia who presents with complaints of productive cough with fever, shortness of breath from [facility] assisted [facility] facility.  She has history of multiple UTIs.  On presentation she was hypotensive, febrile, had leukocytosis and was found to have opacity in the right base suspicious for pneumonia.  She was requiring 4 L of oxygen per minute.  She was started on broad-spectrum antibiotics.  Blood cultures, urine culture showed pansensitive E. coli.  Currently she is hemodynamically stable.  PT/OT recommended skilled nursing facility.  Family also requested palliative care consultation for goals of care/potential hospice.  Patient is medically stable for discharge to skilled nursing facility as soon as bed is available.  Assessment & Plan:   Principal Problem:   Severe sepsis ([place]) Active Problems:   Hypertension   Paroxysmal atrial fibrillation (HCC)   Breast cancer (HCC)   Right lower lobe pneumonia   Acute hypoxemic respiratory failure (HCC)   Severe Sepsis/bacteremia: Presented with fever, leukocytosis, hypotension.  Patient met criteria for severe sepsis on admission given SIRS criteria of respiratory rate  more than 20, temperature 101.5 and evidence of organ dysfunction including hypotension and respiratory failure. Chest x-ray showed right basilar pneumonia.  Urine culture, blood culture showed pansensitive E. coli.  Currently she is hemodynamically stable.  Antibiotics changed to oral.  Acute respiratory failure with hypoxia/right lower lobe pneumonia: Currently respiratory status is stable. On 2 L /min. Will  Try to wean and monitor on room air.  Continue current antibiotics.    Proximal A. fib: Currently rate is well controlled.  On  Eliquis for anticoagulation.  On amiodarone.  Currently in normal sinus rhythm.  Hypertension: Hypertensive on presentation.  Currently blood pressure medications on hold.  History of breast cancer: On tamoxifen.  BRCA positive  Hypothyroidism: Continue Synthyroid  Early dementia: Currently alert and awake and oriented but seems confused on and off as per the son.  Debility/deconditioning: ALF resident.  PT recommended skilled facility on discharge.  Goals of care: DNR.  I had a long conversation with the son.  He says his mother is stubborn, noncompliance and uncooperative with the medical advice.  As per his request ,we consulted palliative consult and she has been recommended outpatient follow up.     DVT prophylaxis: Eliquis Code Status: DNR Family Communication: Discussed with son on phone Status is: Inpatient  Remains inpatient appropriate because:Unsafe d/c plan   Dispo: The patient is from: ALF              Anticipated d/c is to: SNF              Anticipated d/c date is: As soon as bed is available              Patient currently is medically stable to d/c.    Consultants: None  Procedures:None  Antimicrobials:  Anti-infectives (From admission, onward)   Start     Dose/Rate Route Frequency Ordered Stop   12/07/19 1400  cephALEXin (KEFLEX) capsule 500 mg     Discontinue     500 mg Oral Every 6 hours 12/07/19 1351 12/13/19 1159   12/07/19 1400  azithromycin (ZITHROMAX) tablet 500 mg        500 mg Oral Daily 12/07/19 1351 12/08/19 1015  12/05/19 2000  vancomycin (VANCOREADY) IVPB 750 mg/150 mL  Status:  Discontinued        750 mg 150 mL/hr over 60 Minutes Intravenous Every 24 hours 12/04/19 2009 12/04/19 2133   12/05/19 1000  cefTRIAXone (ROCEPHIN) 2 g in sodium chloride 0.9 % 100 mL IVPB  Status:  Discontinued        2 g 200 mL/hr over 30 Minutes Intravenous Every 24 hours 12/04/19 2133 12/07/19 1350   12/04/19 2200  ceFEPIme (MAXIPIME) 2 g in sodium chloride 0.9  % 100 mL IVPB  Status:  Discontinued        2 g 200 mL/hr over 30 Minutes Intravenous Every 12 hours 12/04/19 2009 12/04/19 2133   12/04/19 2145  azithromycin (ZITHROMAX) 500 mg in sodium chloride 0.9 % 250 mL IVPB  Status:  Discontinued        500 mg 250 mL/hr over 60 Minutes Intravenous Every 24 hours 12/04/19 2133 12/07/19 1350   12/04/19 1815  ceFEPIme (MAXIPIME) 2 g in sodium chloride 0.9 % 100 mL IVPB        2 g 200 mL/hr over 30 Minutes Intravenous  Once 12/04/19 1802 12/04/19 1948   12/04/19 1815  metroNIDAZOLE (FLAGYL) IVPB 500 mg        500 mg 100 mL/hr over 60 Minutes Intravenous  Once 12/04/19 1802 12/04/19 2002   12/04/19 1815  vancomycin (VANCOCIN) IVPB 1000 mg/200 mL premix        1,000 mg 200 mL/hr over 60 Minutes Intravenous  Once 12/04/19 1802 12/04/19 2105      Subjective:  Patient seen and examined at the bedside this morning.  Hemodynamically stable.  Comfortable.  No active issues.  Objective: Vitals:   12/09/19 0300 12/09/19 0407 12/09/19 0410 12/09/19 0735  BP:  (!) 144/82  (!) 130/75  Pulse: 81 89  83  Resp: _0 Temp:  98.5 F (36.9 C)  98.2 F (36.8 C)  TempSrc:  Oral  Oral  SpO2:  99%  93%  Weight:   62 kg   Height:        Intake/Output Summary (Last 24 hours) at 12/09/2019 0741 Last data filed at 12/09/2019 0600 Gross per 24 hour  Intake 995 ml  Output --  Net 995 ml   Filed Weights   12/07/19 0500 12/08/19 0611 12/09/19 0410  Weight: 61.3 kg 62.2 kg 62 kg    Examination:   General exam: Elderly deconditioned, debilitated female Respiratory system: No crackles or wheezes Cardiovascular system: S1 & S2 heard, RRR. No JVD, murmurs, rubs, gallops or clicks. Gastrointestinal system: Abdomen is nondistended, soft and nontender. No organomegaly or masses felt. Normal bowel sounds heard. Central nervous system: Alert and oriented.  Extremities: No edema, no clubbing ,no cyanosis, bilateral chronic venous stasis changes on lower  extremities Skin: No rashes, lesions or ulcers,no icterus ,no pallor    Data Reviewed: I have personally reviewed following labs and imaging studies  CBC: Recent Labs  Lab 12/04/19 1802 12/05/19 0351 12/06/19 0308 12/07/19 0136 12/09/19 0145  WBC 17.0* 15.6* 12.0* 12.0* 10.2  NEUTROABS 14.6*  --  9.9* 9.6* 7.3  HGB 10.3* 10.9* 9.5* 10.2* 11.5*  HCT 33.7* 36.1 30.7* 34.2* 36.4  MCV 90.3 91.9 90.6 90.5 88.3  PLT 160 141* 134* 163 789   Basic Metabolic Panel: Recent Labs  Lab 12/04/19 1802 12/05/19 0351 12/06/19 0308 12/07/19 0136 12/09/19 0145  NA 138 143 139 140 139  K 3.5 3.7 2.7*  4.0 3.4*  CL 104 111 107 108 102  CO2 22 20* 25 24 26  GLUCOSE 219* 124* 120* 125* 111*  BUN 25* 20 13 12 17  CREATININE 0.81 0.74 0.53 0.55 0.53  CALCIUM 8.3* 7.9* 7.5* 8.2* 8.6*  MG  --   --  1.9 1.8  --    GFR: Estimated Creatinine Clearance: 38.3 mL/min (by C-G formula based on SCr of 0.53 mg/dL). Liver Function Tests: Recent Labs  Lab 12/04/19 1802  AST 13*  ALT 10  ALKPHOS 35*  BILITOT 0.7  PROT 5.8*  ALBUMIN 3.1*   No results for input(s): LIPASE, AMYLASE in the last 168 hours. No results for input(s): AMMONIA in the last 168 hours. Coagulation Profile: Recent Labs  Lab 12/04/19 1802  INR 1.5*   Cardiac Enzymes: No results for input(s): CKTOTAL, CKMB, CKMBINDEX, TROPONINI in the last 168 hours. BNP (last 3 results) No results for input(s): PROBNP in the last 8760 hours. HbA1C: No results for input(s): HGBA1C in the last 72 hours. CBG: No results for input(s): GLUCAP in the last 168 hours. Lipid Profile: No results for input(s): CHOL, HDL, LDLCALC, TRIG, CHOLHDL, LDLDIRECT in the last 72 hours. Thyroid Function Tests: No results for input(s): TSH, T4TOTAL, FREET4, T3FREE, THYROIDAB in the last 72 hours. Anemia Panel: No results for input(s): VITAMINB12, FOLATE, FERRITIN, TIBC, IRON, RETICCTPCT in the last 72 hours. Sepsis Labs: Recent Labs  Lab  12/04/19 1802 12/04/19 2002 12/06/19 0308 12/07/19 0136  PROCALCITON  --   --  7.27 4.12  LATICACIDVEN 1.9 1.4 0.8  --     Recent Results (from the past 240 hour(s))  Blood Culture (routine x 2)     Status: Abnormal   Collection Time: 12/04/19  6:04 PM   Specimen: BLOOD RIGHT WRIST  Result Value Ref Range Status   Specimen Description BLOOD RIGHT WRIST  Final   Special Requests   Final    BOTTLES DRAWN AEROBIC AND ANAEROBIC Blood Culture adequate volume   Culture  Setup Time   Final    GRAM NEGATIVE RODS AEROBIC BOTTLE ONLY CRITICAL VALUE NOTED.  VALUE IS CONSISTENT WITH PREVIOUSLY REPORTED AND CALLED VALUE.    Culture (A)  Final    ESCHERICHIA COLI SUSCEPTIBILITIES PERFORMED ON PREVIOUS CULTURE WITHIN THE LAST 5 DAYS. Performed at [facility], [address] [place], [place] [address]    Report Status 12/08/2019 FINAL  Final  Blood Culture (routine x 2)     Status: Abnormal   Collection Time: 12/04/19  6:40 PM   Specimen: BLOOD RIGHT HAND  Result Value Ref Range Status   Specimen Description BLOOD RIGHT HAND  Final   Special Requests   Final    BOTTLES DRAWN AEROBIC AND ANAEROBIC Blood Culture adequate volume   Culture  Setup Time   Final    GRAM NEGATIVE RODS AEROBIC BOTTLE ONLY CRITICAL RESULT CALLED TO, READ BACK BY AND VERIFIED WITH: PHARMD M MACCIA [id number] AT 1338 BY CM Performed at [facility], [address] [place], [place] [address]    Culture ESCHERICHIA COLI (A)  Final   Report Status 12/07/2019 FINAL  Final   Organism ID, Bacteria ESCHERICHIA COLI  Final      Susceptibility   Escherichia coli - MIC*    AMPICILLIN >=32 RESISTANT Resistant     CEFAZOLIN <=4 SENSITIVE Sensitive     CEFEPIME <=0.12 SENSITIVE Sensitive     CEFTAZIDIME <=1 SENSITIVE Sensitive     CEFTRIAXONE <=  0.25 SENSITIVE Sensitive     CIPROFLOXACIN <=0.25 SENSITIVE Sensitive     GENTAMICIN <=1 SENSITIVE Sensitive     IMIPENEM <=0.25 SENSITIVE Sensitive     TRIMETH/SULFA  <=20 SENSITIVE Sensitive     AMPICILLIN/SULBACTAM 8 SENSITIVE Sensitive     PIP/TAZO <=4 SENSITIVE Sensitive     * ESCHERICHIA COLI  Blood Culture ID Panel (Reflexed)     Status: Abnormal   Collection Time: 12/04/19  6:40 PM  Result Value Ref Range Status   Enterococcus species NOT DETECTED NOT DETECTED Final   Listeria monocytogenes NOT DETECTED NOT DETECTED Final   Staphylococcus species NOT DETECTED NOT DETECTED Final   Staphylococcus aureus (BCID) NOT DETECTED NOT DETECTED Final   Streptococcus species NOT DETECTED NOT DETECTED Final   Streptococcus agalactiae NOT DETECTED NOT DETECTED Final   Streptococcus pneumoniae NOT DETECTED NOT DETECTED Final   Streptococcus pyogenes NOT DETECTED NOT DETECTED Final   Acinetobacter baumannii NOT DETECTED NOT DETECTED Final   Enterobacteriaceae species DETECTED (A) NOT DETECTED Final    Comment: Enterobacteriaceae represent a large family of gram-negative bacteria, not a single organism. CRITICAL RESULT CALLED TO, READ BACK BY AND VERIFIED WITH: PHARMD M MACCIA [id number] AT 1337 BY CM    Enterobacter cloacae complex NOT DETECTED NOT DETECTED Final   Escherichia coli DETECTED (A) NOT DETECTED Final    Comment: CRITICAL RESULT CALLED TO, READ BACK BY AND VERIFIED WITH: PHARMD M MACCIA [id number] AT 1337 BY CM    Klebsiella oxytoca NOT DETECTED NOT DETECTED Final   Klebsiella pneumoniae NOT DETECTED NOT DETECTED Final   Proteus species NOT DETECTED NOT DETECTED Final   Serratia marcescens NOT DETECTED NOT DETECTED Final   Carbapenem resistance NOT DETECTED NOT DETECTED Final   Haemophilus influenzae NOT DETECTED NOT DETECTED Final   Neisseria meningitidis NOT DETECTED NOT DETECTED Final   Pseudomonas aeruginosa NOT DETECTED NOT DETECTED Final   Candida albicans NOT DETECTED NOT DETECTED Final   Candida glabrata NOT DETECTED NOT DETECTED Final   Candida krusei NOT DETECTED NOT DETECTED Final   Candida parapsilosis NOT DETECTED NOT DETECTED Final    Candida tropicalis NOT DETECTED NOT DETECTED Final    Comment: Performed at  H[facility] Lab, [address] [place], [place] [address]  SARS Coronavirus 2 by RT PCR (hospital order, performed in [place] hospital lab) Nasopharyngeal Nasopharyngeal Swab     Status: None   Collection Time: 12/04/19  7:45 PM   Specimen: Nasopharyngeal Swab  Result Value Ref Range Status   SARS Coronavirus 2 NEGATIVE NEGATIVE Final    Comment: (NOTE) SARS-CoV-2 target nucleic acids are NOT DETECTED.  The SARS-CoV-2 RNA is generally detectable in upper and lower respiratory specimens during the acute phase of infection. The lowest concentration of SARS-CoV-2 viral copies this assay can detect is 250 copies / mL. A negative result does not preclude SARS-CoV-2 infection and should not be used as the sole basis for treatment or other patient management decisions.  A negative result may occur with improper specimen collection / handling, submission of specimen other than nasopharyngeal swab, presence of viral mutation(s) within the areas targeted by this assay, and inadequate number of viral copies (<250 copies / mL). A negative result must be combined with clinical observations, patient history, and epidemiological information.  Fact Sheet for Patients:   https://www.fda.gov/media/136312/download  Fact Sheet for Healthcare Providers: https://www.fda.gov/media/136313/download  This test is not yet approved or  cleared by the [place] FDA and has been authorized for detection   and/or diagnosis of SARS-CoV-2 by FDA under an Emergency Use Authorization (EUA).  This EUA will remain in effect (meaning this test can be used) for the duration of the COVID-19 declaration under Section 564(b)(1) of the Act, 21 U.S.C. section 360bbb-3(b)(1), unless the authorization is terminated or revoked sooner.  Performed at [facility], [address] [place], [place] [address]   Urine culture     Status:  Abnormal   Collection Time: 12/04/19 11:00 PM   Specimen: In/Out Cath Urine  Result Value Ref Range Status   Specimen Description IN/OUT CATH URINE  Final   Special Requests   Final    NONE Performed at [facility], [address] [place], [place] [address]    Culture 10,000 COLONIES/mL ESCHERICHIA COLI (A)  Final   Report Status 12/08/2019 FINAL  Final   Organism ID, Bacteria ESCHERICHIA COLI (A)  Final      Susceptibility   Escherichia coli - MIC*    AMPICILLIN >=32 RESISTANT Resistant     CEFAZOLIN <=4 SENSITIVE Sensitive     CEFTRIAXONE <=0.25 SENSITIVE Sensitive     CIPROFLOXACIN <=0.25 SENSITIVE Sensitive     GENTAMICIN <=1 SENSITIVE Sensitive     IMIPENEM <=0.25 SENSITIVE Sensitive     NITROFURANTOIN <=16 SENSITIVE Sensitive     TRIMETH/SULFA <=20 SENSITIVE Sensitive     AMPICILLIN/SULBACTAM 16 INTERMEDIATE Intermediate     PIP/TAZO <=4 SENSITIVE Sensitive     * 10,000 COLONIES/mL ESCHERICHIA COLI  MRSA PCR Screening     Status: None   Collection Time: 12/05/19  3:46 AM   Specimen: Nasopharyngeal  Result Value Ref Range Status   MRSA by PCR NEGATIVE NEGATIVE Final    Comment:        The GeneXpert MRSA Assay (FDA approved for NASAL specimens only), is one component of a comprehensive MRSA colonization surveillance program. It is not intended to diagnose MRSA infection nor to guide or monitor treatment for MRSA infections. Performed at [facility], [address] , [place] [address]          Radiology Studies: No results found.      Scheduled Meds: . amiodarone  100 mg Oral Daily  . apixaban  2.5 mg Oral BID  . baclofen  10 mg Oral TID  . benazepril  5 mg Oral Daily  . cephALEXin  500 mg Oral Q6H  . furosemide  40 mg Oral [id number]  . gabapentin  200 mg Oral BID  . levothyroxine  50 mcg Oral Daily  . potassium chloride  40 mEq Oral Once  . tamoxifen  10 mg Oral Daily   Continuous Infusions:   LOS: 5 days    Time spent:  35 mins,More than 50% of that time was spent in counseling and/or coordination of care.       , MD [facility] P7/28/2021, 7:41 AM  

## 2019-12-09 NOTE — Progress Notes (Addendum)
Physical Therapy Treatment Patient Details Name: [name] MRN: [id number] DOB: 1930/10/21 Today's Date: 12/09/2019    History of Present Illness 86 yo admitted with SOB, cough with sepsis and PNA. PMHx: breast CA, Afib, HTN, HLD, recent UTI, covid and covid vaccine    PT Comments    Pt required max encouragement for mobility but did agree to sit on EOB for balance and strengthening ex's. Continue to recommend SNF.    Follow Up Recommendations  SNF     Equipment Recommendations  None recommended by PT    Recommendations for Other Services       Precautions / Restrictions Precautions Precautions: Fall    Mobility  Bed Mobility Overal bed mobility: Needs Assistance Bed Mobility: Supine to Sit;Sit to Supine     Supine to sit: +2 for physical assistance;Mod assist Sit to supine: +2 for physical assistance;Max assist   General bed mobility comments: Assist to bring legs off of bed, elevate trunk into sitting and bring hips to EOB. Assist to lower trunk and bring legs back up into bed.  Transfers                 General transfer comment: Pt refused  Ambulation/Gait                 Stairs             Wheelchair Mobility    Modified Rankin (Stroke Patients Only)       Balance Overall balance assessment: Needs assistance Sitting-balance support: Bilateral upper extremity supported;Feet supported Sitting balance-Leahy Scale: Poor Sitting balance - Comments: Sat EOB x 10 minutes with min to min guard Postural control: Posterior lean                                  Cognition Arousal/Alertness: Awake/alert Behavior During Therapy: Anxious Overall Cognitive Status: No family/caregiver present to determine baseline cognitive functioning Area of Impairment: Memory;Following commands;Problem solving                     Memory: Decreased short-term memory Following Commands: Follows one step commands consistently      Problem Solving: Slow processing;Requires verbal cues;Requires tactile cues        Exercises General Exercises - Lower Extremity Ankle Circles/Pumps: AROM;Both;10 reps;Seated Long Arc Quad: AROM;Both;20 reps;Seated Hip Flexion/Marching: AROM;Both;10 reps;Seated Other Exercises Other Exercises: Anterior/posterior leans in sitting x 10    General Comments        Pertinent Vitals/Pain Pain Assessment: No/denies pain    Home Living                      Prior Function            PT Goals (current goals can now be found in the care plan section) Acute Rehab PT Goals Patient Stated Goal: return home Progress towards PT goals: Progressing toward goals    Frequency    Min 2X/week      PT Plan Current plan remains appropriate;Frequency needs to be updated    Co-evaluation              AM-PAC PT "6 Clicks" Mobility   Outcome Measure  Help needed turning from your back to your side while in a flat bed without using bedrails?: A Lot Help needed moving from lying on your back to sitting on the side of a flat bed  without using bedrails?: A Lot Help needed moving to and from a bed to a chair (including a wheelchair)?: Total Help needed standing up from a chair using your arms (e.g., wheelchair or bedside chair)?: Total Help needed to walk in hospital room?: Total Help needed climbing 3-5 steps with a railing? : Total 6 Click Score: 8    End of Session   Activity Tolerance: Patient tolerated treatment well Patient left: in bed;with call bell/phone within reach;with bed alarm set Nurse Communication: Mobility status PT Visit Diagnosis: Other abnormalities of gait and mobility (R26.89);Difficulty in walking, not elsewhere classified (R26.2);Muscle weakness (generalized) (M62.81)     Time: 5170-0174 PT Time Calculation (min) (ACUTE ONLY): 19 min  Charges:  $Therapeutic Activity: 8-22 mins                     [place] Pager [phone] Office [place] 12/09/2019, 10:12 AM

## 2019-12-10 DIAGNOSIS — Z515 Encounter for palliative care: Secondary | ICD-10-CM | POA: Diagnosis not present

## 2019-12-10 DIAGNOSIS — Z7189 Other specified counseling: Secondary | ICD-10-CM | POA: Diagnosis not present

## 2019-12-10 DIAGNOSIS — A419 Sepsis, unspecified organism: Secondary | ICD-10-CM | POA: Diagnosis not present

## 2019-12-10 DIAGNOSIS — R6520 Severe sepsis without septic shock: Secondary | ICD-10-CM | POA: Diagnosis not present

## 2019-12-10 LAB — SARS CORONAVIRUS 2 BY RT PCR (HOSPITAL ORDER, PERFORMED IN ~~LOC~~ HOSPITAL LAB): SARS Coronavirus 2: NEGATIVE

## 2019-12-10 MED ORDER — GERHARDT'S BUTT CREAM
TOPICAL_CREAM | Freq: Two times a day (BID) | CUTANEOUS | Status: DC
  Filled 2019-12-10: qty 1

## 2019-12-10 MED ORDER — GERHARDT'S BUTT CREAM: 1.0000 "application " | TOPICAL_CREAM | Freq: Two times a day (BID) | CUTANEOUS | Status: AC

## 2019-12-10 MED ORDER — FUROSEMIDE 40 MG PO TABS: 40.0000 mg | ORAL_TABLET | ORAL | Status: AC

## 2019-12-10 MED ORDER — BENAZEPRIL HCL 5 MG PO TABS: 5.0000 mg | ORAL_TABLET | Freq: Every day | ORAL | Status: AC

## 2019-12-10 MED ORDER — CEPHALEXIN 500 MG PO CAPS: 500.0000 mg | ORAL_CAPSULE | Freq: Four times a day (QID) | ORAL | 0 refills | Status: AC

## 2019-12-10 NOTE — Progress Notes (Signed)
Palliative:  HPI: 86 y.o. female  with past medical history of atrial fibrillation on Eliquis, HTN, breast cancer, early dementia admitted on 12/04/2019 from [facility] with fever, shortness of breath related to sepsis bacteremia and right lower lobe pneumonia.  I spoke today with son, Ed. Ed explained to me the strained family dynamics and relationship with patient. He reports that he is her HCPOA. Okay for [place] to communicate all information to her cousin, [name], who is local. Ed reports that his mother has been unhappy and talks of being ready to die for years now and is unhappy with her QOL. He knows that she is not at end of life but is interested in comfort and hospice with further decline and when medically appropriate. At current time he is okay with rehospitalization and conservative treatment with IVF, antibiotics, etc but no life support, vent, feeding tube, cardioversion if considered to be a reversible condition. If she has general decline he would opt for comfort care. She is not eligible at this time for hospice. Agree to palliative support to follow and assist with ongoing goals of care based on her status. Will see how she progresses in rehab as she could decline further if she does not participate well to gain strength. He is hopeful she can improve enough to return to [facility].   Plan: - SNF rehab with palliative.  - Hopeful for return to [facility].   [place], NP Palliative Medicine Team Pager [phone] (Please see [email or web address] for schedule) Team Phone [phone]    Greater than 50%  of this time was spent counseling and coordinating care related to the above assessment and plan

## 2019-12-10 NOTE — Progress Notes (Signed)
Anxious would always  Call for help just to have somebody in her room. Checked on her at regular interval.

## 2019-12-10 NOTE — Discharge Summary (Addendum)
Physician Discharge Summary  [name] VOZ:[id number] DOB: 12-21-1930 DOA: 12/04/2019  PCP: [name], MD  Admit date: 12/04/2019 Discharge date: 12/11/19  Admitted From:ALF Disposition:  SNF  Discharge Condition:Stable CODE STATUS: DNR Diet recommendation: Heart Healthy  Brief/Interim Summary:  Patient is 86 year old female with history of proximal A. fib on Eliquis, hypertension, breast cancer on tamoxifen, early dementia who presents with complaints of productive cough with fever, shortness of breath from [facility] facility.  She has history of multiple UTIs.  On presentation she was hypotensive, febrile, had leukocytosis and was found to have opacity in the right base suspicious for pneumonia.  She was requiring 4 L of oxygen per minute.  She was started on broad-spectrum antibiotics.  Blood cultures, urine culture showed pansensitive E. coli.  Currently she is hemodynamically stable.  PT/OT recommended skilled nursing facility.  Currently she is on oral antibiotics.  She is medically stable for discharge to skilled nursing facility today.  Following problems were addressed during her hospitalization:   Severe Sepsis/bacteremia: Presented with fever, leukocytosis, hypotension.  Patient met criteria for severe sepsis on admission given SIRS criteria of respiratory rate  more than 20, temperature 101.5 and evidence of organ dysfunction including hypotension and respiratory failure. Chest x-ray showed right basilar pneumonia.  Urine culture, blood culture showed pansensitive E. coli.  Currently she is hemodynamically stable.  Antibiotics changed to oral.  Acute respiratory failure with hypoxia/right lower lobe pneumonia: Currently respiratory status is stable. On room air.      Proximal A. fib: Currently rate is well controlled.  On Eliquis for anticoagulation.  On amiodarone.  Currently in normal sinus rhythm.  Hypertension: Monitor blood pressure.  Continue  current medications  History of breast cancer: On tamoxifen.  BRCA positive  Hypothyroidism: Continue Synthyroid  Early dementia: Currently alert and awake and oriented but seems confused on and off as per the son.  Debility/deconditioning: ALF resident.  PT recommended skilled facility on discharge.  Goals of care: DNR.  Patient had long history of noncompliance.  She has multiple comorbidities and advanced age.  Palliative care also consulted during this hospitalization and recommended outpatient follow-up.    Discharge Diagnoses:  Principal Problem:   Severe sepsis ([place]) Active Problems:   Hypertension   Paroxysmal atrial fibrillation (H[facility])   Breast cancer (H[facility])   Right lower lobe pneumonia   Acute hypoxemic respiratory failure ([place])   Palliative care by specialist    Discharge Instructions  Discharge Instructions    Diet - low sodium heart healthy   Complete by: As directed    Discharge instructions   Complete by: As directed    1)Take prescribed medications as instructed. 2)Do a CBC and BMP tests in a week.   Increase activity slowly   Complete by: As directed      Allergies as of 12/11/2019      Reactions   Sulfa Antibiotics Other (See Comments)   Per mar      Medication List    STOP taking these medications   amLODipine 5 MG tablet Commonly known as: NORVASC     TAKE these medications   acetaminophen 500 MG tablet Commonly known as: TYLENOL Take 500 mg by mouth every 4 (four) hours.   amiodarone 100 MG tablet Commonly known as: PACERONE Take 1 tablet (100 mg total) daily by mouth.   apixaban 2.5 MG Tabs tablet Commonly known as: Eliquis Take 1 tablet (2.5 mg total) by mouth 2 (two) times daily.  baclofen 10 MG tablet Commonly known as: LIORESAL Take 1 tablet (10 mg total) by mouth 3 (three) times daily.   benazepril 5 MG tablet Commonly known as: LOTENSIN Take 1 tablet (5 mg total) by mouth daily. What changed:   medication  strength  how much to take   cephALEXin 500 MG capsule Commonly known as: KEFLEX Take 1 capsule (500 mg total) by mouth every 6 (six) hours for 2 days.   furosemide 40 MG tablet Commonly known as: LASIX Take 1 tablet (40 mg total) by mouth every morning. What changed:   medication strength  how much to take   gabapentin 100 MG capsule Commonly known as: NEURONTIN Take 200 mg by mouth 2 (two) times daily.   Gerhardt's butt cream Crea Apply 1 application topically 2 (two) times daily.   levothyroxine 50 MCG tablet Commonly known as: SYNTHROID Take 50 mcg by mouth daily.   loperamide 2 MG capsule Commonly known as: IMODIUM Take 2 mg by mouth 4 (four) times daily as needed. For loose stools/diarrhea   menthol-zinc oxide powder Apply 1 application topically daily.   potassium chloride SA 20 MEQ tablet Commonly known as: KLOR-CON Take 20 mEq by mouth daily.   tamoxifen 10 MG tablet Commonly known as: NOLVADEX Take 10 mg by mouth daily.   Trimo-San 0.025 % Gel Generic drug: OXYQUINOLONE SULFATE VAGINAL Place 1 application vaginally See admin instructions. On Fridays       Allergies  Allergen Reactions  . Sulfa Antibiotics Other (See Comments)    Per mar    Consultations:  Palliative care   Procedures/Studies: DG Chest Port 1 View  Result Date: 12/04/2019 CLINICAL DATA:  Shortness of breath, fever EXAM: PORTABLE CHEST 1 VIEW COMPARISON:  Radiograph 09/18/2019 FINDINGS: Suboptimal projection due to patient difficulty with positioning. Slight left anterior obliquity. Streaky opacities in the right lung base, favor atelectasis though early infection is not excluded in the setting of fever. Remaining portions the lungs are clear. Stable cardiomediastinal contours accounting for differences in technique with mild cardiomegaly and a calcified aorta. No pneumothorax or visible effusion though portion of the left costophrenic sulcus is collimated. Telemetry leads  overlie the chest. No acute osseous or soft tissue abnormality. IMPRESSION: 1. Suboptimal projection due to patient difficulty with positioning. 2. Streaky opacities in the right lung base, favor atelectasis though early infection is not excluded in the setting of fever. 3. Stable cardiomegaly. 4.  Aortic Atherosclerosis (ICD10-I70.0). Electronically Signed   By: Lovena Le M.D.   On: 12/04/2019 18:53      Subjective: Patient seen and examined at the bedside this morning.  Hemodynamically stable for discharge as soon as possible to the skilled nursing  facility.  Discharge Exam: Vitals:   12/11/19 0358 12/11/19 0733  BP: 123/74 125/73  Pulse: 81 85  Resp: 22 16  Temp: 98.1 F (36.7 C) 97.9 F (36.6 C)  SpO2: 97% 94%   Vitals:   12/10/19 1935 12/10/19 2314 12/11/19 0358 12/11/19 0733  BP: 102/71 (!) 138/91 123/74 125/73  Pulse: 86 90 81 85  Resp: _0 Temp: 98.1 F (36.7 C) 98.8 F (37.1 C) 98.1 F (36.7 C) 97.9 F (36.6 C)  TempSrc: Oral Oral Oral Oral  SpO2: 90% 96% 97% 94%  Weight:   61.8 kg   Height:        General: Pt is alert, awake, not in acute distress Cardiovascular: RRR, S1/S2 +, no rubs, no gallops Respiratory: CTA bilaterally, no  wheezing, no rhonchi Abdominal: Soft, NT, ND, bowel sounds + Extremities: no edema, no cyanosis    The results of significant diagnostics from this hospitalization (including imaging, microbiology, ancillary and laboratory) are listed below for reference.     Microbiology: Recent Results (from the past 240 hour(s))  Blood Culture (routine x 2)     Status: Abnormal   Collection Time: 12/04/19  6:04 PM   Specimen: BLOOD RIGHT WRIST  Result Value Ref Range Status   Specimen Description BLOOD RIGHT WRIST  Final   Special Requests   Final    BOTTLES DRAWN AEROBIC AND ANAEROBIC Blood Culture adequate volume   Culture  Setup Time   Final    GRAM NEGATIVE RODS AEROBIC BOTTLE ONLY CRITICAL VALUE NOTED.  VALUE IS  CONSISTENT WITH PREVIOUSLY REPORTED AND CALLED VALUE.    Culture (A)  Final    ESCHERICHIA COLI SUSCEPTIBILITIES PERFORMED ON PREVIOUS CULTURE WITHIN THE LAST 5 DAYS. Performed at [facility], [place] [address], [place], [place] [address]    Report Status 12/08/2019 FINAL  Final  Blood Culture (routine x 2)     Status: Abnormal   Collection Time: 12/04/19  6:40 PM   Specimen: BLOOD RIGHT HAND  Result Value Ref Range Status   Specimen Description BLOOD RIGHT HAND  Final   Special Requests   Final    BOTTLES DRAWN AEROBIC AND ANAEROBIC Blood Culture adequate volume   Culture  Setup Time   Final    GRAM NEGATIVE RODS AEROBIC BOTTLE ONLY CRITICAL RESULT CALLED TO, READ BACK BY AND VERIFIED WITH: Ailene Rud [id number] AT 9798 BY CM Performed at [facility], [place] [address] [place], [place] [address]    Culture ESCHERICHIA COLI (A)  Final   Report Status 12/07/2019 FINAL  Final   Organism ID, Bacteria ESCHERICHIA COLI  Final      Susceptibility   Escherichia coli - MIC*    AMPICILLIN >=32 RESISTANT Resistant     CEFAZOLIN <=4 SENSITIVE Sensitive     CEFEPIME <=0.12 SENSITIVE Sensitive     CEFTAZIDIME <=1 SENSITIVE Sensitive     CEFTRIAXONE <=0.25 SENSITIVE Sensitive     CIPROFLOXACIN <=0.25 SENSITIVE Sensitive     GENTAMICIN <=1 SENSITIVE Sensitive     IMIPENEM <=0.25 SENSITIVE Sensitive     TRIMETH/SULFA <=20 SENSITIVE Sensitive     AMPICILLIN/SULBACTAM 8 SENSITIVE Sensitive     PIP/TAZO <=4 SENSITIVE Sensitive     * ESCHERICHIA COLI  Blood Culture ID Panel (Reflexed)     Status: Abnormal   Collection Time: 12/04/19  6:40 PM  Result Value Ref Range Status   Enterococcus species NOT DETECTED NOT DETECTED Final   Listeria monocytogenes NOT DETECTED NOT DETECTED Final   Staphylococcus species NOT DETECTED NOT DETECTED Final   Staphylococcus aureus (BCID) NOT DETECTED NOT DETECTED Final   Streptococcus species NOT DETECTED NOT DETECTED Final   Streptococcus agalactiae  NOT DETECTED NOT DETECTED Final   Streptococcus pneumoniae NOT DETECTED NOT DETECTED Final   Streptococcus pyogenes NOT DETECTED NOT DETECTED Final   Acinetobacter baumannii NOT DETECTED NOT DETECTED Final   Enterobacteriaceae species DETECTED (A) NOT DETECTED Final    Comment: Enterobacteriaceae represent a large family of gram-negative bacteria, not a single organism. CRITICAL RESULT CALLED TO, READ BACK BY AND VERIFIED WITH: PHARMD M [place] [id number] AT 1337 BY CM    Enterobacter cloacae complex NOT DETECTED NOT DETECTED Final   Escherichia coli DETECTED (A) NOT DETECTED Final  Comment: CRITICAL RESULT CALLED TO, READ BACK BY AND VERIFIED WITH: PHARMD M [place] [id number] AT 5638 BY CM    Klebsiella oxytoca NOT DETECTED NOT DETECTED Final   Klebsiella pneumoniae NOT DETECTED NOT DETECTED Final   Proteus species NOT DETECTED NOT DETECTED Final   Serratia marcescens NOT DETECTED NOT DETECTED Final   Carbapenem resistance NOT DETECTED NOT DETECTED Final   Haemophilus influenzae NOT DETECTED NOT DETECTED Final   Neisseria meningitidis NOT DETECTED NOT DETECTED Final   Pseudomonas aeruginosa NOT DETECTED NOT DETECTED Final   Candida albicans NOT DETECTED NOT DETECTED Final   Candida glabrata NOT DETECTED NOT DETECTED Final   Candida krusei NOT DETECTED NOT DETECTED Final   Candida parapsilosis NOT DETECTED NOT DETECTED Final   Candida tropicalis NOT DETECTED NOT DETECTED Final    Comment: Performed at [facility], [place] [address] [place], [place] [address]  SARS Coronavirus 2 by RT PCR (hospital order, performed in [facility] hospital lab) Nasopharyngeal Nasopharyngeal Swab     Status: None   Collection Time: 12/04/19  7:45 PM   Specimen: Nasopharyngeal Swab  Result Value Ref Range Status   SARS Coronavirus 2 NEGATIVE NEGATIVE Final    Comment: (NOTE) SARS-CoV-2 target nucleic acids are NOT DETECTED.  The SARS-CoV-2 RNA is generally detectable in upper and lower respiratory  specimens during the acute phase of infection. The lowest concentration of SARS-CoV-2 viral copies this assay can detect is 250 copies / mL. A negative result does not preclude SARS-CoV-2 infection and should not be used as the sole basis for treatment or other patient management decisions.  A negative result may occur with improper specimen collection / handling, submission of specimen other than nasopharyngeal swab, presence of viral mutation(s) within the areas targeted by this assay, and inadequate number of viral copies (<250 copies / mL). A negative result must be combined with clinical observations, patient history, and epidemiological information.  Fact Sheet for Patients:   StrictlyIdeas.no  Fact Sheet for Healthcare Providers: BankingDealers.co.za  This test is not yet approved or  cleared by the [place] FDA and has been authorized for detection and/or diagnosis of SARS-CoV-2 by FDA under an Emergency Use Authorization (EUA).  This EUA will remain in effect (meaning this test can be used) for the duration of the COVID-19 declaration under Section 564(b)(1) of the Act, 21 U.S.C. section 360bbb-3(b)(1), unless the authorization is terminated or revoked sooner.  Performed at [facility], [place] [address], [place], [place] [address]   Urine culture     Status: Abnormal   Collection Time: 12/04/19 11:00 PM   Specimen: In/Out Cath Urine  Result Value Ref Range Status   Specimen Description IN/OUT CATH URINE  Final   Special Requests   Final    NONE Performed at [facility], [place] [address], [place], [place] [address]    Culture 10,000 COLONIES/mL ESCHERICHIA COLI (A)  Final   Report Status 12/08/2019 FINAL  Final   Organism ID, Bacteria ESCHERICHIA COLI (A)  Final      Susceptibility   Escherichia coli - MIC*    AMPICILLIN >=32 RESISTANT Resistant     CEFAZOLIN <=4 SENSITIVE Sensitive     CEFTRIAXONE <=0.25  SENSITIVE Sensitive     CIPROFLOXACIN <=0.25 SENSITIVE Sensitive     GENTAMICIN <=1 SENSITIVE Sensitive     IMIPENEM <=0.25 SENSITIVE Sensitive     NITROFURANTOIN <=16 SENSITIVE Sensitive     TRIMETH/SULFA <=20 SENSITIVE Sensitive  AMPICILLIN/SULBACTAM 16 INTERMEDIATE Intermediate     PIP/TAZO <=4 SENSITIVE Sensitive     * 10,000 COLONIES/mL ESCHERICHIA COLI  MRSA PCR Screening     Status: None   Collection Time: 12/05/19  3:46 AM   Specimen: Nasopharyngeal  Result Value Ref Range Status   MRSA by PCR NEGATIVE NEGATIVE Final    Comment:        The GeneXpert MRSA Assay (FDA approved for NASAL specimens only), is one component of a comprehensive MRSA colonization surveillance program. It is not intended to diagnose MRSA infection nor to guide or monitor treatment for MRSA infections. Performed at [facility], [place] [address], [place], [place] [address]   SARS Coronavirus 2 by RT PCR (hospital order, performed in [facility] hospital lab) Nasopharyngeal Nasopharyngeal Swab     Status: None   Collection Time: 12/10/19  1:46 PM   Specimen: Nasopharyngeal Swab  Result Value Ref Range Status   SARS Coronavirus 2 NEGATIVE NEGATIVE Final    Comment: (NOTE) SARS-CoV-2 target nucleic acids are NOT DETECTED.  The SARS-CoV-2 RNA is generally detectable in upper and lower respiratory specimens during the acute phase of infection. The lowest concentration of SARS-CoV-2 viral copies this assay can detect is 250 copies / mL. A negative result does not preclude SARS-CoV-2 infection and should not be used as the sole basis for treatment or other patient management decisions.  A negative result may occur with improper specimen collection / handling, submission of specimen other than nasopharyngeal swab, presence of viral mutation(s) within the areas targeted by this assay, and inadequate number of viral copies (<250 copies / mL). A negative result must be combined with  clinical observations, patient history, and epidemiological information.  Fact Sheet for Patients:   StrictlyIdeas.no  Fact Sheet for Healthcare Providers: BankingDealers.co.za  This test is not yet approved or  cleared by the [place] FDA and has been authorized for detection and/or diagnosis of SARS-CoV-2 by FDA under an Emergency Use Authorization (EUA).  This EUA will remain in effect (meaning this test can be used) for the duration of the COVID-19 declaration under Section 564(b)(1) of the Act, 21 U.S.C. section 360bbb-3(b)(1), unless the authorization is terminated or revoked sooner.  Performed at [facility], [place] [address], [place], [place] [address]      Labs: BNP (last 3 results) No results for input(s): BNP in the last 8760 hours. Basic Metabolic Panel: Recent Labs  Lab 12/04/19 1802 12/05/19 0351 12/06/19 0308 12/07/19 0136 12/09/19 0145  NA 138 143 139 140 139  K 3.5 3.7 2.7* 4.0 3.4*  CL 104 111 107 108 102  CO2 22 20* _0 GLUCOSE 219* 124* 120* 125* 111*  BUN 25* _1 CREATININE 0.81 0.74 0.53 0.55 0.53  CALCIUM 8.3* 7.9* 7.5* 8.2* 8.6*  MG  --   --  1.9 1.8  --    Liver Function Tests: Recent Labs  Lab 12/04/19 1802  AST 13*  ALT 10  ALKPHOS 35*  BILITOT 0.7  PROT 5.8*  ALBUMIN 3.1*   No results for input(s): LIPASE, AMYLASE in the last 168 hours. No results for input(s): AMMONIA in the last 168 hours. CBC: Recent Labs  Lab 12/04/19 1802 12/05/19 0351 12/06/19 0308 12/07/19 0136 12/09/19 0145  WBC 17.0* 15.6* 12.0* 12.0* 10.2  NEUTROABS 14.6*  --  9.9* 9.6* 7.3  HGB 10.3* 10.9* 9.5* 10.2* 11.5*  HCT 33.7* 36.1 30.7* 34.2* 36.4  MCV 90.3 91.9 90.6 90.5 88.3  PLT 160 141* 134* 163 200   Cardiac Enzymes: No results for input(s): CKTOTAL, CKMB, CKMBINDEX, TROPONINI in the last 168 hours. BNP: Invalid input(s): POCBNP CBG: No results for input(s): GLUCAP in  the last 168 hours. D-Dimer No results for input(s): DDIMER in the last 72 hours. Hgb A1c No results for input(s): HGBA1C in the last 72 hours. Lipid Profile No results for input(s): CHOL, HDL, LDLCALC, TRIG, CHOLHDL, LDLDIRECT in the last 72 hours. Thyroid function studies No results for input(s): TSH, T4TOTAL, T3FREE, THYROIDAB in the last 72 hours.  Invalid input(s): FREET3 Anemia work up No results for input(s): VITAMINB12, FOLATE, FERRITIN, TIBC, IRON, RETICCTPCT in the last 72 hours. Urinalysis    Component Value Date/Time   COLORURINE YELLOW 12/04/2019 2313   APPEARANCEUR HAZY (A) 12/04/2019 2313   LABSPEC 1.013 12/04/2019 2313   PHURINE 5.0 12/04/2019 2313   GLUCOSEU NEGATIVE 12/04/2019 2313   HGBUR LARGE (A) 12/04/2019 2313   BILIRUBINUR NEGATIVE 12/04/2019 2313   KETONESUR NEGATIVE 12/04/2019 2313   PROTEINUR 30 (A) 12/04/2019 2313   UROBILINOGEN 1.0 03/07/2011 0044   NITRITE POSITIVE (A) 12/04/2019 2313   LEUKOCYTESUR SMALL (A) 12/04/2019 2313   Sepsis Labs Invalid input(s): PROCALCITONIN,  WBC,  LACTICIDVEN Microbiology Recent Results (from the past 240 hour(s))  Blood Culture (routine x 2)     Status: Abnormal   Collection Time: 12/04/19  6:04 PM   Specimen: BLOOD RIGHT WRIST  Result Value Ref Range Status   Specimen Description BLOOD RIGHT WRIST  Final   Special Requests   Final    BOTTLES DRAWN AEROBIC AND ANAEROBIC Blood Culture adequate volume   Culture  Setup Time   Final    GRAM NEGATIVE RODS AEROBIC BOTTLE ONLY CRITICAL VALUE NOTED.  VALUE IS CONSISTENT WITH PREVIOUSLY REPORTED AND CALLED VALUE.    Culture (A)  Final    ESCHERICHIA COLI SUSCEPTIBILITIES PERFORMED ON PREVIOUS CULTURE WITHIN THE LAST 5 DAYS. Performed at [facility], [place] [address] [place], [place] [address]    Report Status 12/08/2019 FINAL  Final  Blood Culture (routine x 2)     Status: Abnormal   Collection Time: 12/04/19  6:40 PM   Specimen: BLOOD RIGHT HAND  Result  Value Ref Range Status   Specimen Description BLOOD RIGHT HAND  Final   Special Requests   Final    BOTTLES DRAWN AEROBIC AND ANAEROBIC Blood Culture adequate volume   Culture  Setup Time   Final    GRAM NEGATIVE RODS AEROBIC BOTTLE ONLY CRITICAL RESULT CALLED TO, READ BACK BY AND VERIFIED WITH: Ailene Rud [id number] AT 3009 BY CM Performed at [facility], [place] [address] [place],  [address]    Culture ESCHERICHIA COLI (A)  Final   Report Status 12/07/2019 FINAL  Final   Organism ID, Bacteria ESCHERICHIA COLI  Final      Susceptibility   Escherichia coli - MIC*    AMPICILLIN >=32 RESISTANT Resistant     CEFAZOLIN <=4 SENSITIVE Sensitive     CEFEPIME <=0.12 SENSITIVE Sensitive     CEFTAZIDIME <=1 SENSITIVE Sensitive     CEFTRIAXONE <=0.25 SENSITIVE Sensitive     CIPROFLOXACIN <=0.25 SENSITIVE Sensitive     GENTAMICIN <=1 SENSITIVE Sensitive     IMIPENEM <=0.25 SENSITIVE Sensitive     TRIMETH/SULFA <=20 SENSITIVE Sensitive     AMPICILLIN/SULBACTAM 8 SENSITIVE Sensitive     PIP/TAZO <=4 SENSITIVE Sensitive     *  ESCHERICHIA COLI  Blood Culture ID Panel (Reflexed)     Status: Abnormal   Collection Time: 12/04/19  6:40 PM  Result Value Ref Range Status   Enterococcus species NOT DETECTED NOT DETECTED Final   Listeria monocytogenes NOT DETECTED NOT DETECTED Final   Staphylococcus species NOT DETECTED NOT DETECTED Final   Staphylococcus aureus (BCID) NOT DETECTED NOT DETECTED Final   Streptococcus species NOT DETECTED NOT DETECTED Final   Streptococcus agalactiae NOT DETECTED NOT DETECTED Final   Streptococcus pneumoniae NOT DETECTED NOT DETECTED Final   Streptococcus pyogenes NOT DETECTED NOT DETECTED Final   Acinetobacter baumannii NOT DETECTED NOT DETECTED Final   Enterobacteriaceae species DETECTED (A) NOT DETECTED Final    Comment: Enterobacteriaceae represent a large family of gram-negative bacteria, not a single organism. CRITICAL RESULT CALLED TO, READ BACK BY  AND VERIFIED WITH: PHARMD M [place] [id number] AT 0160 BY CM    Enterobacter cloacae complex NOT DETECTED NOT DETECTED Final   Escherichia coli DETECTED (A) NOT DETECTED Final    Comment: CRITICAL RESULT CALLED TO, READ BACK BY AND VERIFIED WITH: PHARMD M [place] [id number] AT 1337 BY CM    Klebsiella oxytoca NOT DETECTED NOT DETECTED Final   Klebsiella pneumoniae NOT DETECTED NOT DETECTED Final   Proteus species NOT DETECTED NOT DETECTED Final   Serratia marcescens NOT DETECTED NOT DETECTED Final   Carbapenem resistance NOT DETECTED NOT DETECTED Final   Haemophilus influenzae NOT DETECTED NOT DETECTED Final   Neisseria meningitidis NOT DETECTED NOT DETECTED Final   Pseudomonas aeruginosa NOT DETECTED NOT DETECTED Final   Candida albicans NOT DETECTED NOT DETECTED Final   Candida glabrata NOT DETECTED NOT DETECTED Final   Candida krusei NOT DETECTED NOT DETECTED Final   Candida parapsilosis NOT DETECTED NOT DETECTED Final   Candida tropicalis NOT DETECTED NOT DETECTED Final    Comment: Performed at [facility], [place] [address], [place], [place] [address]  SARS Coronavirus 2 by RT PCR (hospital order, performed in [facility] hospital lab) Nasopharyngeal Nasopharyngeal Swab     Status: None   Collection Time: 12/04/19  7:45 PM   Specimen: Nasopharyngeal Swab  Result Value Ref Range Status   SARS Coronavirus 2 NEGATIVE NEGATIVE Final    Comment: (NOTE) SARS-CoV-2 target nucleic acids are NOT DETECTED.  The SARS-CoV-2 RNA is generally detectable in upper and lower respiratory specimens during the acute phase of infection. The lowest concentration of SARS-CoV-2 viral copies this assay can detect is 250 copies / mL. A negative result does not preclude SARS-CoV-2 infection and should not be used as the sole basis for treatment or other patient management decisions.  A negative result may occur with improper specimen collection / handling, submission of specimen other than nasopharyngeal  swab, presence of viral mutation(s) within the areas targeted by this assay, and inadequate number of viral copies (<250 copies / mL). A negative result must be combined with clinical observations, patient history, and epidemiological information.  Fact Sheet for Patients:   StrictlyIdeas.no  Fact Sheet for Healthcare Providers: BankingDealers.co.za  This test is not yet approved or  cleared by the [place] FDA and has been authorized for detection and/or diagnosis of SARS-CoV-2 by FDA under an Emergency Use Authorization (EUA).  This EUA will remain in effect (meaning this test can be used) for the duration of the COVID-19 declaration under Section 564(b)(1) of the Act, 21 U.S.C. section 360bbb-3(b)(1), unless the authorization is terminated or revoked sooner.  Performed at [facility], [address] [place], [place] [address]   Urine culture     Status: Abnormal   Collection Time: 12/04/19 11:00 PM   Specimen: In/Out Cath Urine  Result Value Ref Range Status   Specimen Description IN/OUT CATH URINE  Final   Special Requests   Final    NONE Performed at [facility], [place] [address], [place], [place] [address]    Culture 10,000 COLONIES/mL ESCHERICHIA COLI (A)  Final   Report Status 12/08/2019 FINAL  Final   Organism ID, Bacteria ESCHERICHIA COLI (A)  Final      Susceptibility   Escherichia coli - MIC*    AMPICILLIN >=32 RESISTANT Resistant     CEFAZOLIN <=4 SENSITIVE Sensitive     CEFTRIAXONE <=0.25 SENSITIVE Sensitive     CIPROFLOXACIN <=0.25 SENSITIVE Sensitive     GENTAMICIN <=1 SENSITIVE Sensitive     IMIPENEM <=0.25 SENSITIVE Sensitive     NITROFURANTOIN <=16 SENSITIVE Sensitive     TRIMETH/SULFA <=20 SENSITIVE Sensitive     AMPICILLIN/SULBACTAM 16 INTERMEDIATE Intermediate     PIP/TAZO <=4 SENSITIVE Sensitive     * 10,000 COLONIES/mL ESCHERICHIA COLI  MRSA PCR Screening     Status: None    Collection Time: 12/05/19  3:46 AM   Specimen: Nasopharyngeal  Result Value Ref Range Status   MRSA by PCR NEGATIVE NEGATIVE Final    Comment:        The GeneXpert MRSA Assay (FDA approved for NASAL specimens only), is one component of a comprehensive MRSA colonization surveillance program. It is not intended to diagnose MRSA infection nor to guide or monitor treatment for MRSA infections. Performed at [facility], [place] [address], [place], [place] [address]   SARS Coronavirus 2 by RT PCR (hospital order, performed in [facility] hospital lab) Nasopharyngeal Nasopharyngeal Swab     Status: None   Collection Time: 12/10/19  1:46 PM   Specimen: Nasopharyngeal Swab  Result Value Ref Range Status   SARS Coronavirus 2 NEGATIVE NEGATIVE Final    Comment: (NOTE) SARS-CoV-2 target nucleic acids are NOT DETECTED.  The SARS-CoV-2 RNA is generally detectable in upper and lower respiratory specimens during the acute phase of infection. The lowest concentration of SARS-CoV-2 viral copies this assay can detect is 250 copies / mL. A negative result does not preclude SARS-CoV-2 infection and should not be used as the sole basis for treatment or other patient management decisions.  A negative result may occur with improper specimen collection / handling, submission of specimen other than nasopharyngeal swab, presence of viral mutation(s) within the areas targeted by this assay, and inadequate number of viral copies (<250 copies / mL). A negative result must be combined with clinical observations, patient history, and epidemiological information.  Fact Sheet for Patients:   StrictlyIdeas.no  Fact Sheet for Healthcare Providers: BankingDealers.co.za  This test is not yet approved or  cleared by the [place] FDA and has been authorized for detection and/or diagnosis of SARS-CoV-2 by FDA under an Emergency Use Authorization (EUA).  This  EUA will remain in effect (meaning this test can be used) for the duration of the COVID-19 declaration under Section 564(b)(1) of the Act, 21 U.S.C. section 360bbb-3(b)(1), unless the authorization is terminated or revoked sooner.  Performed at [facility], [place] [address] [place], [place] [address]     Please note: You were cared for by a hospitalist during your hospital stay. Once you are discharged, your primary care physician  will handle any further medical issues. Please note that NO REFILLS for any discharge medications will be authorized once you are discharged, as it is imperative that you return to your primary care physician (or establish a relationship with a primary care physician if you do not have one) for your post hospital discharge needs so that they can reassess your need for medications and monitor your lab values.    Time coordinating discharge: 40 minutes  SIGNED:   Shelly Coss, MD  [facility] 12/11/2019, 9:17 AM Pager [phone]  If 7PM-7AM, please contact night-coverage [email or web address] Password [email or web address]

## 2019-12-10 NOTE — TOC Progression Note (Signed)
Transition of Care (TOC) - Progression Note  *Discharge to [facility] on 7/30.   Patient Details  Name: [name] MRN: [id number] Date of Birth: [birth year]  Transition of Care [facility]) CM/SW Contact  Sharlet Salina Mila Homer, LCSW Phone Number: 12/10/2019, 1:10 PM  Clinical Narrative: CSW visited patient at bedside to speak with her regarding going to a facility for [place] rehab. Patient was lying in bed and was awake and alert. Explained that short-term rehab recommended and there were facilities that can take her. When asked, patient reported that she has never been to [facility] re[facility] before.    Call made to cousin Gwynneth Aliment [phone]) regarding the recommendation of ST rehab and that SNF's had responded. Ms. Damita Lack reported that patient has been to [facility] before and when informed that responded that they can take patient, she requested that patient return there. When asked, Ms. Damita Lack responded that patient has had the [place] vaccinations. CSW also advised that patient has a son that lives in [place], and per Ms. Damita Lack he does not care about his mother. She added that she has been helping patient for about 20 years and added that she can be difficult at times.  Call made to [facility], admissions director at [facility] regarding patient and she can take her tomorrow. Call made to [employer] and was advised that insurance auth approved: Auth ID #[id number], Eff. 7/30, next review date 8/3 and case manager is [employer] (e-mail - [email or web address]). Contacted Janie at [facility] and [place] information provided. MD advised regarding bed at [facility] on Friday, 7/30.   Expected Discharge Plan: Skilled Nursing Facility Barriers to Discharge: Insurance Authorization  Expected Discharge Plan and Services Expected Discharge Plan: [place]       Living arrangements for the past 2 months: [place]                                       Social  Determinants of Health (SDOH) Interventions  No SDOH interventions requested or needed at this time  Readmission Risk Interventions No flowsheet data found.

## 2019-12-10 NOTE — Plan of Care (Signed)

## 2019-12-10 NOTE — Progress Notes (Signed)
Manufacturing engineer (ACC) Community based Palliative Care Services  Received referral [name], PMT NP for community based palliative services upon discharge. Unable to reach patient in room via telephone. Spoke to patient cousin [name] (per [facility] recommendation) who confirmed patient interest in [facility] then explained [place] services and gave family ACC contact information. Per [name], plan is for patient to discharge to [facility] on 12/11/19. [facility] will follow up with patient once at facility.  Thank you for this referral,  Gar Ponto, RN [facility] Liaison [phone]

## 2019-12-10 NOTE — Plan of Care (Signed)
  Problem: Fluid Volume: Goal: Hemodynamic stability will improve Outcome: Progressing   Problem: Clinical Measurements: Goal: Diagnostic test results will improve Outcome: Progressing   Problem: Respiratory: Goal: Ability to maintain adequate ventilation will improve Outcome: Progressing   Problem: Education: Goal: Knowledge of General Education information will improve Description: Including pain rating scale, medication(s)/side effects and non-pharmacologic comfort measures Outcome: Progressing   Problem: Clinical Measurements: Goal: Ability to maintain clinical measurements within normal limits will improve Outcome: Progressing   Problem: Clinical Measurements: Goal: Respiratory complications will improve Outcome: Progressing   Problem: Activity: Goal: Risk for activity intolerance will decrease Outcome: Progressing   Problem: Nutrition: Goal: Adequate nutrition will be maintained Outcome: Progressing   Problem: Coping: Goal: Level of anxiety will decrease Outcome: Progressing   Problem: Elimination: Goal: Will not experience complications related to urinary retention Outcome: Progressing   Problem: Pain Managment: Goal: General experience of comfort will improve Outcome: Progressing   Problem: Skin Integrity: Goal: Risk for impaired skin integrity will decrease Outcome: Progressing

## 2019-12-11 NOTE — TOC Transition Note (Signed)
Transition of Care [facility]) - CM/SW Discharge Note   Patient Details  Name: [name] MRN: [id number] Date of Birth: [birth year]  Transition of Care [facility]) CM/SW Contact:  Jacquelynn Cree Phone Number: 12/11/2019, 1:38 PM   Clinical Narrative:    Patient will DC to: [facility] Family notified: Wilburn Mylar by: Corey Harold  RN, patient, patient's family, and facility notified of DC. Discharge Summary and FL2 sent to facility. RN to call report prior to discharge [phone] Rm [phone]). DC packet on chart. Ambulance transport requested for patient.   CSW will sign off for now as social work intervention is no longer needed. Please consult [place] again if new needs arise.    Final next level of care: Skilled Nursing Facility Barriers to Discharge: No Barriers Identified   Patient Goals and CMS Choice   CMS Medicare.gov Compare Post Acute Care list provided to:: Patient Represenative (must comment) [name]) Choice offered to / list presented to :  (Relative)  Discharge Placement              Patient chooses bed at: [facility] Patient to be transferred to facility by: [place] Name of family member notified: [name] Patient and family notified of of transfer: 12/11/19  Discharge Plan and Services                                     Social Determinants of Health (SDOH) Interventions     Readmission Risk Interventions No flowsheet data found.

## 2019-12-11 NOTE — Progress Notes (Signed)
Patient seen and examined at the bedside this morning.  She remains hemodynamically stable.  She is ready for discharge.  DC summary and already have already been placed yesterday.  No new changes in the management today.

## 2019-12-11 NOTE — Progress Notes (Signed)
Picked up by [facility] ambulance. Discharge instructions given, belongings with pt. Including yellow color cross pendant with stone, pair of earring and yellow color watch.

## 2019-12-11 NOTE — Progress Notes (Signed)
All set for transfer to [facility]. Awaiting for [facility] ambulance for transport.

## 2019-12-11 NOTE — Progress Notes (Signed)
No return call received from [facility], second call placed but no one answering the phone. Still waiting for the transport.

## 2019-12-11 NOTE — Progress Notes (Signed)
Placed a call to [facility] to give report,spoke with sheala who claimed to let the nurse call back to get report. Tel. # given. Awaiting return call.

## 2019-12-21 ENCOUNTER — Other Ambulatory Visit: Payer: Self-pay

## 2019-12-21 ENCOUNTER — Non-Acute Institutional Stay: Payer: Medicare Other | Admitting: Internal Medicine

## 2019-12-21 NOTE — Progress Notes (Unsigned)
Aug 9th, 2021 [facility] Palliative Care Consult Note Telephone: [phone]  Fax: [phone]  PATIENT NAME: [name] DOB: December 27, 1930 MRN: [id number]  Long term resident [place]. 12/11/19 short term rehab [facility]   PRIMARY CARE PROVIDER: [name], MD [address] [place],  [place] [address]  REFERRING PROVIDER: Gaston Islam, NP/Dr. Darden Amber, MD [address],  [place] [address]  RESPONSIBLE PARTY: Downs,Karen (Relative)  [phone] (Home Phone)  ASSESSMENT:        RECOMMENDATIONS and PLAN:  1.  I spent 60 minutes providing this consultation from 10am-11am. More than 50% of the time in this consultation was spent coordinating communication.   HISTORY OF PRESENT ILLNESS:  [name] is an 86 y.o. female with history of proximal A. Fib (Eliquis, Amiodarone), hypertension, breast cancer (tamoxifen), early dementia, UTIs. -7/23-7/30/2021: Hospitalized sepsis, R basilar pneumonia, UTI. Acute respiratory failure with Hypoxia.    Palliative Care was asked to help address goals of care.   CODE STATUS:   PPS: 0%  HOSPICE ELIGIBILITY/DIAGNOSIS: TBD  PAST MEDICAL HISTORY:  Past Medical History:  Diagnosis Date  . Anxiety   . Atrial fibrillation ([place])   . Breast CA ([place])   . Constipation   . Depression   . Disorder of bone density and structure, unspecified   . Fibromyalgia   . Hyperkalemia   . Hyperlipemia   . Hypertension   . Impaired fasting glucose   . Retention of urine, unspecified   . Scoliosis   . Thyroid disease     SOCIAL HX:  Social History   Tobacco Use  . Smoking status: Never Smoker  . Smokeless tobacco: Never Used  Substance Use Topics  . Alcohol use: No    ALLERGIES:  Allergies  Allergen Reactions  . Sulfa Antibiotics Other (See Comments)    Per mar     PERTINENT MEDICATIONS:  Outpatient Encounter  Medications as of 12/21/2019  Medication Sig  . acetaminophen (TYLENOL) 500 MG tablet Take 500 mg by mouth every 4 (four) hours.  Marland Kitchen amiodarone (PACERONE) 100 MG tablet Take 1 tablet (100 mg total) daily by mouth.  Marland Kitchen apixaban (ELIQUIS) 2.5 MG TABS tablet Take 1 tablet (2.5 mg total) by mouth 2 (two) times daily.  . baclofen (LIORESAL) 10 MG tablet Take 1 tablet (10 mg total) by mouth 3 (three) times daily.  . benazepril (LOTENSIN) 5 MG tablet Take 1 tablet (5 mg total) by mouth daily.  . furosemide (LASIX) 40 MG tablet Take 1 tablet (40 mg total) by mouth every morning.  . gabapentin (NEURONTIN) 100 MG capsule Take 200 mg by mouth 2 (two) times daily.   Marland Kitchen levothyroxine (SYNTHROID, LEVOTHROID) 50 MCG tablet Take 50 mcg by mouth daily.    Marland Kitchen loperamide (IMODIUM) 2 MG capsule Take 2 mg by mouth 4 (four) times daily as needed. For loose stools/diarrhea   . menthol-zinc oxide (GOLD BOND) powder Apply 1 application topically daily.  Marland Kitchen Nystatin (GERHARDT'S BUTT CREAM) CREA Apply 1 application topically 2 (two) times daily.  Levin Erp SULFATE VAGINAL (TRIMO-SAN) 0.025 % GEL Place 1 application vaginally See admin instructions. On Fridays  . potassium chloride SA (K-DUR,KLOR-CON) 20 MEQ tablet Take 20 mEq by mouth daily.    . tamoxifen (NOLVADEX) 10 MG tablet Take 10 mg by mouth daily.     No facility-administered encounter medications on file as of 12/21/2019.  PHYSICAL EXAM:   General: NAD, frail appearing, thin Cardiovascular: regular rate and rhythm Pulmonary: clear ant fields Abdomen: soft, nontender, + bowel sounds GU: no suprapubic tenderness Extremities: no edema, no joint deformities Skin: no rashes Neurological: Weakness but otherwise nonfocal  Julianne Handler, NP

## 2019-12-22 ENCOUNTER — Non-Acute Institutional Stay: Payer: Medicare Other | Admitting: Internal Medicine

## 2019-12-22 ENCOUNTER — Other Ambulatory Visit: Payer: Self-pay

## 2019-12-22 ENCOUNTER — Encounter: Payer: Self-pay | Admitting: Internal Medicine

## 2019-12-22 DIAGNOSIS — Z515 Encounter for palliative care: Secondary | ICD-10-CM

## 2019-12-22 DIAGNOSIS — Z7189 Other specified counseling: Secondary | ICD-10-CM

## 2019-12-22 NOTE — Progress Notes (Signed)
Aug 10th, 2021 [facility] Palliative Care Consult Note Telephone: [phone]  Fax: [phone]  PATIENT NAME: [name] DOB: Oct 24, 1930 MRN: [id number]  Long term resident [place] for 6-7 yrs. (adm 12/11/19) short term rehab [facility]   PRIMARY CARE PROVIDER:  [name], MD [address] [place] [address]  REFERRING PROVIDER: Gaston Islam, NP/Dr. Darden Amber, MD [address] [place] [address]  RESPONSIBLE PARTY: Gwynneth Aliment (Cousin/GBO). [phone] (Home Phone). Ed Heinzman (son/[place]/not much to with) [phone]. Brinklee Cisse (son/ estranged) [phone]  ASSESSMENT / RECOMMENDATIONS:  1. Advance Care Planning: A. Directives: DNR and MOST forms on facility chart. MOST details: DNR/DNI. Limited Scope of Additional Interventions. Yest to IVFs and Antibiotics. I asked her if she would wish to be hospitalized if she got sick like she was the last admission and she said "No". B. Goals of Care: Anxious to go back to [place].  2. Cognitive / Functional status, Symptom Management: Patient is alert and oriented to self and place. Knew she was in a rehab facility after hospitalization and that the name of the facility began with the letter "B". Patient has strong opinions and ways.  She is on the quiet side but opinionated and likes to direct her family members, staff, and her roommates at [place]. She has a cell phone but doesn't want to take it out and use it b/c she is afraid it will get lost. We called her cousin from my phone, to touch base. Before hospitalization she ambulated independently at [facility] consistently with her walker.  Cousin reports some decline in mobility during the COVID confined to room restrictions.  Working with physical therapy but she's resistant to transferring out of bed b/c she's worried that she'll just be left in the chair and  won't have a way to get back into bed. The therapist is currently here to work with her and is trying to convince her otherwise. She is currently dependent for dressing, hygiene, and toileting. She can feed herself.  -Discussed with patient that the fastest way she can get back to her AL spot at [facility] is to participate in and work hard with her physical therapist.  Patient's cousin reports that patient has had a gradual but consistent weight loss of about 50 lbs over the last 18 months, to her current weight of approximately 130lbs.  Patient is fond of loves cookies. At a height of 4'11" her BMI is 26.3kg/m2. Suszanne Finch reports patient with h/o LE swelling with weeping; currently improved. [name] also reports patient with h/o opioid addiction d/t back pain. Now treated with Tylenol only for pain.  3. Family / Community Supports: Resident of   Widowed. Estranged from sons. Patient's daughter and granddaughter were both killed in an MVA. Used to work for [employer] in [place]. Moved to  in late 1980's. Current resident of [facility] for the last 7 yrs. Suszanne Finch who is also in her 63's looks out for her. Grocery shops for patient q 2wks.  4. Follow up Palliative Care Visit: Post hospitalization at [facility]. Will ask our liaison to obtain consult from that facility. I spent 60 minutes providing this consultation from 10am-11am. More than 50% of the time in this consultation was spent coordinating communication.   HISTORY OF PRESENT ILLNESS:  ARTHUR AYDELOTTE is an 86 y.o. female with history of proximal A. Fib (Eliquis, Amiodarone), hypertension, breast cancer (  tamoxifen), early dementia, UTIs, hypothyroidism, pneumonia -7/23-7/30/2021: Hospitalized sepsis, R basilar pneumonia, UTI. Acute respiratory failure with Hypoxia.    Palliative Care was asked to help address goals of care.   CODE STATUS: DNR  PPS: 30%  HOSPICE ELIGIBILITY/DIAGNOSIS: TBD PAST MEDICAL HISTORY:    Past Medical History:  Diagnosis Date  . Anxiety   . Atrial fibrillation ([place])   . Breast CA ([place])   . Constipation   . Depression   . Disorder of bone density and structure, unspecified   . Fibromyalgia   . Hyperkalemia   . Hyperlipemia   . Hypertension   . Impaired fasting glucose   . Retention of urine, unspecified   . Scoliosis   . Thyroid disease     SOCIAL HX:  Social History   Tobacco Use  . Smoking status: Never Smoker  . Smokeless tobacco: Never Used  Substance Use Topics  . Alcohol use: No    ALLERGIES:  Allergies  Allergen Reactions  . Sulfa Antibiotics Other (See Comments)    Per mar     PERTINENT MEDICATIONS:  Outpatient Encounter Medications as of 12/22/2019  Medication Sig  . acetaminophen (TYLENOL) 500 MG tablet Take 500 mg by mouth every 4 (four) hours.  Marland Kitchen amiodarone (PACERONE) 100 MG tablet Take 1 tablet (100 mg total) daily by mouth.  Marland Kitchen apixaban (ELIQUIS) 2.5 MG TABS tablet Take 1 tablet (2.5 mg total) by mouth 2 (two) times daily.  . baclofen (LIORESAL) 10 MG tablet Take 1 tablet (10 mg total) by mouth 3 (three) times daily.  . benazepril (LOTENSIN) 5 MG tablet Take 1 tablet (5 mg total) by mouth daily.  . furosemide (LASIX) 40 MG tablet Take 1 tablet (40 mg total) by mouth every morning.  . gabapentin (NEURONTIN) 100 MG capsule Take 200 mg by mouth 2 (two) times daily.   Marland Kitchen levothyroxine (SYNTHROID, LEVOTHROID) 50 MCG tablet Take 50 mcg by mouth daily.    Marland Kitchen loperamide (IMODIUM) 2 MG capsule Take 2 mg by mouth 4 (four) times daily as needed. For loose stools/diarrhea   . menthol-zinc oxide (GOLD BOND) powder Apply 1 application topically daily.  Marland Kitchen Nystatin (GERHARDT'S BUTT CREAM) CREA Apply 1 application topically 2 (two) times daily.  Levin Erp SULFATE VAGINAL (TRIMO-SAN) 0.025 % GEL Place 1 application vaginally See admin instructions. On Fridays  . potassium chloride SA (K-DUR,KLOR-CON) 20 MEQ tablet Take 20 mEq by mouth daily.    .  tamoxifen (NOLVADEX) 10 MG tablet Take 10 mg by mouth daily.     No facility-administered encounter medications on file as of 12/22/2019.    PHYSICAL EXAM:  100/60. HR 92 (variable), Sats 92%, RR 16  General: NAD, frail appearing, thin. No dyspnea with conversation. Denies pain Cardiovascular: irregular rate and rhythm Pulmonary: clear ant fields, scant soft inspiratory crackles bilateral bases Abdomen: protruding, distended (baseline per patient)  + bowel sounds GU: no suprapubic tenderness Extremities: LE edema but improving (wrinkling); skin changes typical for chronic venous stasis, no joint deformities Skin: no rashes Neurological: Weakness but otherwise non-focal. I needed to assist her to sitting up in bed.  Julianne Handler, NP [place]

## 2020-01-26 ENCOUNTER — Emergency Department (HOSPITAL_COMMUNITY): Payer: Medicare Other

## 2020-01-26 ENCOUNTER — Inpatient Hospital Stay (HOSPITAL_COMMUNITY)
Admission: EM | Admit: 2020-01-26 | Discharge: 2020-02-01 | DRG: 871 | Disposition: A | Discharge status: Skilled Nursing Facility | Payer: Medicare Other | Attending: Family Medicine | Admitting: Family Medicine

## 2020-01-26 ENCOUNTER — Other Ambulatory Visit: Payer: Self-pay

## 2020-01-26 ENCOUNTER — Inpatient Hospital Stay (HOSPITAL_COMMUNITY): Payer: Medicare Other

## 2020-01-26 DIAGNOSIS — Z7901 Long term (current) use of anticoagulants: Secondary | ICD-10-CM

## 2020-01-26 DIAGNOSIS — Z66 Do not resuscitate: Secondary | ICD-10-CM | POA: Diagnosis present

## 2020-01-26 DIAGNOSIS — Z853 Personal history of malignant neoplasm of breast: Secondary | ICD-10-CM | POA: Diagnosis not present

## 2020-01-26 DIAGNOSIS — R54 Age-related physical debility: Secondary | ICD-10-CM | POA: Diagnosis present

## 2020-01-26 DIAGNOSIS — S32409A Unspecified fracture of unspecified acetabulum, initial encounter for closed fracture: Secondary | ICD-10-CM | POA: Diagnosis present

## 2020-01-26 DIAGNOSIS — I10 Essential (primary) hypertension: Secondary | ICD-10-CM | POA: Diagnosis present

## 2020-01-26 DIAGNOSIS — W06XXXA Fall from bed, initial encounter: Secondary | ICD-10-CM | POA: Diagnosis present

## 2020-01-26 DIAGNOSIS — Z515 Encounter for palliative care: Secondary | ICD-10-CM | POA: Diagnosis present

## 2020-01-26 DIAGNOSIS — A4151 Sepsis due to Escherichia coli [E. coli]: Principal | ICD-10-CM | POA: Diagnosis present

## 2020-01-26 DIAGNOSIS — D72829 Elevated white blood cell count, unspecified: Secondary | ICD-10-CM

## 2020-01-26 DIAGNOSIS — B029 Zoster without complications: Secondary | ICD-10-CM | POA: Diagnosis present

## 2020-01-26 DIAGNOSIS — Y92002 Bathroom of unspecified non-institutional (private) residence single-family (private) house as the place of occurrence of the external cause: Secondary | ICD-10-CM

## 2020-01-26 DIAGNOSIS — F0281 Dementia in other diseases classified elsewhere with behavioral disturbance: Secondary | ICD-10-CM | POA: Diagnosis not present

## 2020-01-26 DIAGNOSIS — I4819 Other persistent atrial fibrillation: Secondary | ICD-10-CM | POA: Diagnosis present

## 2020-01-26 DIAGNOSIS — Z5320 Procedure and treatment not carried out because of patient's decision for unspecified reasons: Secondary | ICD-10-CM | POA: Diagnosis not present

## 2020-01-26 DIAGNOSIS — S32402A Unspecified fracture of left acetabulum, initial encounter for closed fracture: Secondary | ICD-10-CM | POA: Diagnosis present

## 2020-01-26 DIAGNOSIS — L299 Pruritus, unspecified: Secondary | ICD-10-CM | POA: Diagnosis present

## 2020-01-26 DIAGNOSIS — E785 Hyperlipidemia, unspecified: Secondary | ICD-10-CM | POA: Diagnosis present

## 2020-01-26 DIAGNOSIS — W19XXXA Unspecified fall, initial encounter: Secondary | ICD-10-CM

## 2020-01-26 DIAGNOSIS — Z96642 Presence of left artificial hip joint: Secondary | ICD-10-CM | POA: Diagnosis present

## 2020-01-26 DIAGNOSIS — Z8249 Family history of ischemic heart disease and other diseases of the circulatory system: Secondary | ICD-10-CM

## 2020-01-26 DIAGNOSIS — E669 Obesity, unspecified: Secondary | ICD-10-CM | POA: Diagnosis present

## 2020-01-26 DIAGNOSIS — E872 Acidosis, unspecified: Secondary | ICD-10-CM | POA: Diagnosis present

## 2020-01-26 DIAGNOSIS — E039 Hypothyroidism, unspecified: Secondary | ICD-10-CM | POA: Diagnosis present

## 2020-01-26 DIAGNOSIS — Z7189 Other specified counseling: Secondary | ICD-10-CM | POA: Diagnosis not present

## 2020-01-26 DIAGNOSIS — A419 Sepsis, unspecified organism: Secondary | ICD-10-CM | POA: Diagnosis not present

## 2020-01-26 DIAGNOSIS — M797 Fibromyalgia: Secondary | ICD-10-CM | POA: Diagnosis present

## 2020-01-26 DIAGNOSIS — S32401A Unspecified fracture of right acetabulum, initial encounter for closed fracture: Secondary | ICD-10-CM | POA: Diagnosis not present

## 2020-01-26 DIAGNOSIS — Z87440 Personal history of urinary (tract) infections: Secondary | ICD-10-CM

## 2020-01-26 DIAGNOSIS — G300 Alzheimer's disease with early onset: Secondary | ICD-10-CM | POA: Diagnosis present

## 2020-01-26 DIAGNOSIS — Z1501 Genetic susceptibility to malignant neoplasm of breast: Secondary | ICD-10-CM

## 2020-01-26 DIAGNOSIS — R627 Adult failure to thrive: Secondary | ICD-10-CM | POA: Diagnosis not present

## 2020-01-26 DIAGNOSIS — Z20822 Contact with and (suspected) exposure to covid-19: Secondary | ICD-10-CM | POA: Diagnosis present

## 2020-01-26 DIAGNOSIS — N390 Urinary tract infection, site not specified: Secondary | ICD-10-CM | POA: Diagnosis present

## 2020-01-26 DIAGNOSIS — R6520 Severe sepsis without septic shock: Secondary | ICD-10-CM | POA: Diagnosis present

## 2020-01-26 DIAGNOSIS — F0280 Dementia in other diseases classified elsewhere without behavioral disturbance: Secondary | ICD-10-CM | POA: Diagnosis present

## 2020-01-26 DIAGNOSIS — Z6832 Body mass index (BMI) 32.0-32.9, adult: Secondary | ICD-10-CM | POA: Diagnosis not present

## 2020-01-26 LAB — LACTIC ACID, PLASMA
Lactic Acid, Venous: 2.2 mmol/L (ref 0.5–1.9)
Lactic Acid, Venous: 1.7 mmol/L (ref 0.5–1.9)

## 2020-01-26 LAB — COMPREHENSIVE METABOLIC PANEL
ALT: 11 U/L (ref 0–44)
AST: 13 U/L — ABNORMAL LOW (ref 15–41)
Albumin: 3.2 g/dL — ABNORMAL LOW (ref 3.5–5.0)
Alkaline Phosphatase: 49 U/L (ref 38–126)
Anion gap: 12 (ref 5–15)
BUN: 20 mg/dL (ref 8–23)
CO2: 25 mmol/L (ref 22–32)
Calcium: 9.4 mg/dL (ref 8.9–10.3)
Chloride: 103 mmol/L (ref 98–111)
Creatinine, Ser: 0.59 mg/dL (ref 0.44–1.00)
GFR calc Af Amer: >60 mL/min (ref >60)
GFR calc non Af Amer: >60 mL/min (ref >60)
Glucose, Bld: 110 mg/dL — ABNORMAL HIGH (ref 70–99)
Potassium: 4.2 mmol/L (ref 3.5–5.1)
Sodium: 140 mmol/L (ref 135–145)
Total Bilirubin: 0.5 mg/dL (ref 0.3–1.2)
Total Protein: 6.8 g/dL (ref 6.5–8.1)

## 2020-01-26 LAB — CBC
HCT: 44.6 % (ref 36.0–46.0)
Hemoglobin: 13.6 g/dL (ref 12.0–15.0)
MCH: 28.2 pg (ref 26.0–34.0)
MCHC: 30.5 g/dL (ref 30.0–36.0)
MCV: 92.5 fL (ref 80.0–100.0)
Platelets: 273 10*3/uL (ref 150–400)
RBC: 4.82 MIL/uL (ref 3.87–5.11)
RDW: 14.1 % (ref 11.5–15.5)
WBC: 11.1 10*3/uL — ABNORMAL HIGH (ref 4.0–10.5)
nRBC: 0 % (ref 0.0–0.2)

## 2020-01-26 LAB — SARS CORONAVIRUS 2 BY RT PCR (HOSPITAL ORDER, PERFORMED IN ~~LOC~~ HOSPITAL LAB): SARS Coronavirus 2: NEGATIVE

## 2020-01-26 LAB — URINALYSIS, ROUTINE W REFLEX MICROSCOPIC
Bilirubin Urine: NEGATIVE
Glucose, UA: NEGATIVE mg/dL
Ketones, ur: NEGATIVE mg/dL
Nitrite: NEGATIVE
Protein, ur: NEGATIVE mg/dL
Specific Gravity, Urine: 1.011 (ref 1.005–1.030)
WBC, UA: >50 WBC/hpf — ABNORMAL HIGH (ref 0–5)
pH: 5 (ref 5.0–8.0)

## 2020-01-26 LAB — ETHANOL: Alcohol, Ethyl (B): <10 mg/dL (ref <10)

## 2020-01-26 LAB — PROTIME-INR
INR: 1.3 — ABNORMAL HIGH (ref 0.8–1.2)
Prothrombin Time: 16.1 seconds — ABNORMAL HIGH (ref 11.4–15.2)

## 2020-01-26 LAB — SAMPLE TO BLOOD BANK

## 2020-01-26 MED ORDER — APIXABAN 2.5 MG PO TABS
2.5000 mg | ORAL_TABLET | Freq: Two times a day (BID) | ORAL | Status: DC
  Administered 2020-01-26 – 2020-02-01 (×12): 2.5 mg via ORAL
  Filled 2020-01-26 (×13): qty 1

## 2020-01-26 MED ORDER — SENNA 8.6 MG PO TABS
1.0000 | ORAL_TABLET | Freq: Every day | ORAL | Status: DC
  Administered 2020-01-27 – 2020-02-01 (×5): 8.6 mg via ORAL
  Filled 2020-01-26 (×5): qty 1

## 2020-01-26 MED ORDER — SODIUM CHLORIDE 0.9 % IV SOLN
1.0000 g | INTRAVENOUS | Status: DC
  Administered 2020-01-26 – 2020-01-30 (×5): 1 g via INTRAVENOUS
  Filled 2020-01-26 (×5): qty 10

## 2020-01-26 MED ORDER — SODIUM CHLORIDE 0.9 % IV SOLN: Freq: Once | INTRAVENOUS | Status: AC

## 2020-01-26 MED ORDER — HYDROCODONE-ACETAMINOPHEN 5-325 MG PO TABS
1.0000 | ORAL_TABLET | Freq: Four times a day (QID) | ORAL | Status: DC | PRN
  Administered 2020-01-27 – 2020-01-31 (×4): 1 via ORAL
  Filled 2020-01-26 (×4): qty 1

## 2020-01-26 MED ORDER — MORPHINE SULFATE (PF) 2 MG/ML IV SOLN: 0.5000 mg | INTRAVENOUS | Status: DC | PRN

## 2020-01-26 NOTE — Consult Note (Signed)
Consultation Note Date: 01/26/2020   Patient Name: [name]  DOB: 09-22-1930  MRN: [id number]  Age / Sex: 86 y.o., female  PCP: Myrtis Ser, CNM Referring Physician: Norval Morton, MD  Reason for Consultation: Establishing goals of care  HPI/Patient Profile: 86 y.o. female  with past medical history of hypertension, paroxysmal atrial fibrillation on Eliquis, breast cancer, and dementia presents to [facility] emergency department on 01/26/2020 with after a fall.  ED course:  Labs significant for WBC 11.2, BUN 20, creatinine 0.59, and lactic acid 2.2. Imaging studies were significant for a left periprosthetic acetabular fracture. Orthopedics was consulted and recommended obtaining a CT scan of the left hip for further evaluation.   Of note, patient was hospitalized 7/23-7/30 with sepsis secondary to pneumonia. Discharged to [facility] for rehab. Prior to that she had been a long-term resident of [facility].   Primary decision maker: Jenelle Drennon (son and HCPOA) [phone] Patient is unable to independently make complex medical decisions.   Clinical Assessment and Goals of Care: I have reviewed medical records including EPIC notes, labs and imaging, and examined the patient. She is pleasantly confused; does not know why she is in the hospital but wants to return to [place]. She denies pain.    I called Ed to discuss diagnosis, prognosis, GOC, EOL wishes, disposition, and options.  I introduced Palliative Medicine as specialized medical care for people living with serious illness. It focuses on providing relief from the symptoms and stress of a serious illness. 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   Social history: Widowed. She had 3 children--Son/Ed is HCPOA and lives in [place], other son is estranged. Patient's daughter and granddaughter were both killed in an  MVA. She used to work for [employer] in [place]. Moved to [place] in late 1980's. Prior to July, she had been a long term resident at [facility] for 7 years.   As far as functional and nutritional status, Ed reports a decline since hospitalization in July. She requires assistance for dressing, hygiene, and toileting. She can feed herself. She has been resistant to work with PT or transfer to the chair.   I attempted to elicit values and goals of care important to the patient. Ed reports that his mother has been unhappy and talks of being ready to die for years now and is unhappy with her quality of life. He is interested in comfort care and hospice when medically appropriate.    Ed states he has spoken with ortho, and so far they were recommending non-surgical management.   SUMMARY OF RECOMMENDATIONS   - DNR (present on admission) - awaiting full recommendations from ortho - Son is interested in hospice care, however I am not sure if she would quality for residential hospice at this time and hospice care is not an option for a patient in SNF/rehab - PMT will continue to follow  Code Status/Advance Care Planning:  DNR  Symptom Management:   Per primary team  Palliative Prophylaxis:   Frequent Pain Assessment, Oral Care and Turn Reposition  Prognosis:   Unable to determine  Discharge Planning: To Be Determined      Primary Diagnoses: Present on Admission: . Acetabular fracture ([place]) . Leukocytosis . Lactic acidosis . Fall . Early onset Alzheimer's dementia ([employer]) . DNR (do not resuscitate) . Persistent atrial fibrillation (HCC)   Scheduled Meds: . apixaban  2.5 mg Oral BID  . senna  1 tablet Oral Daily   Continuous Infusions: . cefTRIAXone (ROCEPHIN)  IV 1 g (01/26/20 1821)   PRN Meds:.HYDROcodone-acetaminophen, morphine injection  Review of Systems  Unable to perform ROS: Dementia    Physical Exam Vitals reviewed.  Constitutional:      General: She is not in  acute distress.    Comments: Frail appearing  HENT:     Head: Normocephalic and atraumatic.  Cardiovascular:     Rate and Rhythm: Normal rate.     Comments: A-fib on monitor Pulmonary:     Effort: Pulmonary effort is normal.  Neurological:     Mental Status: She is alert. She is confused.     Vital Signs: BP 118/68   Pulse 80   Temp 98.1 F (36.7 C) (Oral)   Resp 15   Ht 5\' 2"  (1.575 m)   Wt 79.4 kg   SpO2 100%   BMI 32.01 kg/m  Pain Scale: 0-10   Pain Score: 9    SpO2: SpO2: 100 % O2 Device:SpO2: 100 % O2 Flow Rate: .   IO: Intake/output summary:   Intake/Output Summary (Last 24 hours) at 01/26/2020 1907 Last data filed at 01/26/2020 1634 Gross per 24 hour  Intake --  Output 200 ml  Net -200 ml    Baseline Weight: Weight: 79.4 kg Most recent weight: Weight: 79.4 kg      Palliative Assessment/Data: PPS 30%    Time In: 19:00 Time Out: 19:50 Time Total: 50 minutes  Greater than 50%  of this time was spent counseling and coordinating care related to the above assessment and plan.  Signed by: Lavena Bullion, NP   Please contact Palliative Medicine Team phone at [phone] for questions and concerns.  For individual provider: See Shea Evans

## 2020-01-26 NOTE — ED Notes (Signed)
Pt refused labdraw

## 2020-01-26 NOTE — Consult Note (Addendum)
Reason for Consult:Left acet fx Referring Physician: C Tegeler  [name] is an 86 y.o. female.  HPI: [name] fell out of bed earlier today at [facility] where she resides. She is moderately demented and cannot contribute much to history. A series of x-rays showed a left periprosthetic acetabular fx and orthopedic surgery was consulted. She denies hip pain and says everything is 2/2 her scoliosis. She did c/o left knee pain when manipulated. I have tried to call her SNF to see how much she ambulates but have been unable to get through to anyone.  No past medical history on file.  No family history on file.  Social History:  has no history on file for tobacco use, alcohol use, and drug use.  Allergies: Not on File  Medications: I have reviewed the patient's current medications.  Results for orders placed or performed during the hospital encounter of 01/26/20 (from the past 48 hour(s))  Comprehensive metabolic panel     Status: Abnormal   Collection Time: 01/26/20  2:04 PM  Result Value Ref Range   Sodium 140 135 - 145 mmol/L   Potassium 4.2 3.5 - 5.1 mmol/L   Chloride 103 98 - 111 mmol/L   CO2 25 22 - 32 mmol/L   Glucose, Bld 110 (H) 70 - 99 mg/dL    Comment: Glucose reference range applies only to samples taken after fasting for at least 8 hours.   BUN 20 8 - 23 mg/dL   Creatinine, Ser 0.59 0.44 - 1.00 mg/dL   Calcium 9.4 8.9 - 10.3 mg/dL   Total Protein 6.8 6.5 - 8.1 g/dL   Albumin 3.2 (L) 3.5 - 5.0 g/dL   AST 13 (L) 15 - 41 U/L   ALT 11 0 - 44 U/L   Alkaline Phosphatase 49 38 - 126 U/L   Total Bilirubin 0.5 0.3 - 1.2 mg/dL   GFR calc non Af Amer >60 >60 mL/min   GFR calc Af Amer >60 >60 mL/min   Anion gap 12 5 - 15    Comment: Performed at [facility], [place] [address], [place], [place] [address]  CBC     Status: Abnormal   Collection Time: 01/26/20  2:04 PM  Result Value Ref Range   WBC 11.1 (H) 4.0 - 10.5 K/uL   RBC 4.82 3.87 - 5.11 MIL/uL   Hemoglobin 13.6  12.0 - 15.0 g/dL   HCT 44.6 36 - 46 %   MCV 92.5 80.0 - 100.0 fL   MCH 28.2 26.0 - 34.0 pg   MCHC 30.5 30.0 - 36.0 g/dL   RDW 14.1 11.5 - 15.5 %   Platelets 273 150 - 400 K/uL   nRBC 0.0 0.0 - 0.2 %    Comment: Performed at [facility], [place] [address] [place], [place] [address]  Ethanol     Status: None   Collection Time: 01/26/20  2:04 PM  Result Value Ref Range   Alcohol, Ethyl (B) <10 <10 mg/dL    Comment: (NOTE) Lowest detectable limit for serum alcohol is 10 mg/dL.  For medical purposes only. Performed at [facility], [place] [address], [place], [place] [address]   Lactic acid, plasma     Status: Abnormal   Collection Time: 01/26/20  2:04 PM  Result Value Ref Range   Lactic Acid, Venous 2.2 (HH) 0.5 - 1.9 mmol/L    Comment: CRITICAL RESULT CALLED TO, READ BACK BY AND VERIFIED WITH: MADISON FOUNTAIN,RN AT 1017 01/26/2020  BY ZBEECH. Performed at [facility], [place] [address], [place], [place] [address]   Protime-INR     Status: Abnormal   Collection Time: 01/26/20  2:04 PM  Result Value Ref Range   Prothrombin Time 16.1 (H) 11.4 - 15.2 seconds   INR 1.3 (H) 0.8 - 1.2    Comment: (NOTE) INR goal varies based on device and disease states. Performed at [facility], [place] [address], [place], [place] [address]   Sample to Blood Bank     Status: None   Collection Time: 01/26/20  2:15 PM  Result Value Ref Range   Blood Bank Specimen SAMPLE AVAILABLE FOR TESTING    Sample Expiration      01/27/2020,2359 Performed at [facility], [place] [address], [place], [place] [address]     DG Pelvis Portable  Result Date: 01/26/2020 CLINICAL DATA:  Fall. EXAM: PORTABLE PELVIS 1-2 VIEWS COMPARISON:  November 08, 2009.  December 15, 2018. FINDINGS: Status post left hip arthroplasty. There is interval development of mildly displaced periprosthetic left acetabular fracture. Right hip is unremarkable. Degenerative changes seen involving both sacroiliac joints.  IMPRESSION: Interval development of mildly displaced periprosthetic left acetabular fracture. Electronically Signed   By: Marijo Conception M.D.   On: 01/26/2020 14:50   DG Chest Port 1 View  Result Date: 01/26/2020 CLINICAL DATA:  Fall. EXAM: PORTABLE CHEST 1 VIEW COMPARISON:  December 04, 2019.  Sep 18, 2019. FINDINGS: The heart size and mediastinal contours are within normal limits. Both lungs are clear. No pneumothorax or pleural effusion is noted. Stable chronic anterior dislocation involving proximal right humerus is noted. IMPRESSION: No active disease. Electronically Signed   By: Marijo Conception M.D.   On: 01/26/2020 14:47   DG Shoulder Right Port  Result Date: 01/26/2020 CLINICAL DATA:  Fall. EXAM: PORTABLE RIGHT SHOULDER COMPARISON:  Sep 18, 2019. FINDINGS: There is stable anterior dislocation or subluxation of the right proximal humeral head relative to the glenoid labrum consistent with chronic dislocation. Deformity of right humeral head is noted consistent with chronic degenerative change. No acute fracture is noted. IMPRESSION: Stable chronic anterior dislocation or subluxation of right proximal humeral head relative to the glenoid labrum. Chronic degenerative change of right humeral head is noted. Electronically Signed   By: Marijo Conception M.D.   On: 01/26/2020 14:51   DG Knee Left Port  Result Date: 01/26/2020 CLINICAL DATA:  Status post fall. EXAM: PORTABLE LEFT KNEE - 1-2 VIEW COMPARISON:  None. FINDINGS: Status post left total knee arthroplasty. The femoral and tibial components appear to be well situated. No fracture or dislocation is noted. No soft tissue abnormality is noted. IMPRESSION: Status post left total knee arthroplasty. No acute abnormality seen in the left knee. Electronically Signed   By: Marijo Conception M.D.   On: 01/26/2020 14:45   DG Knee Right Port  Result Date: 01/26/2020 CLINICAL DATA:  Fall. EXAM: PORTABLE RIGHT KNEE - 1-2 VIEW COMPARISON:  None. FINDINGS: Status  post right total knee arthroplasty. The femoral and tibial components appear to be well situated. No fracture or dislocation is noted. IMPRESSION: Status post right total knee arthroplasty. No acute abnormality seen in the right knee. Electronically Signed   By: Marijo Conception M.D.   On: 01/26/2020 14:48    Review of Systems  Unable to perform ROS: Dementia  Musculoskeletal: Positive for back pain. Negative for arthralgias (Left hip pain).   Blood pressure 108/70, pulse  87, temperature 98.1 F (36.7 C), temperature source Oral, resp. rate (!) 23, height 5\' 2"  (1.575 m), weight 79.4 kg, SpO2 98 %. Physical Exam Constitutional:      General: She is not in acute distress.    Appearance: She is well-developed. She is not diaphoretic.  HENT:     Head: Normocephalic and atraumatic.  Eyes:     General: No scleral icterus.       Right eye: No discharge.        Left eye: No discharge.     Conjunctiva/sclera: Conjunctivae normal.  Cardiovascular:     Rate and Rhythm: Normal rate and regular rhythm.  Pulmonary:     Effort: Pulmonary effort is normal. No respiratory distress.  Musculoskeletal:     Cervical back: Normal range of motion.     Comments: LLE Small abrasion knee, ecchymosis, or rash  NT hip, mild pain knee, no pain with ext/int hip rotation though she would only let me range her about 30 degrees  No knee or ankle effusion  Knee stable to varus/ valgus and anterior/posterior stress  Sens DPN, SPN, TN grossly intact  Motor EHL, ext, flex, evers grossly intact  DP 1+, PT 0, 2+ NP edema  Skin:    General: Skin is warm and dry.  Neurological:     Mental Status: She is alert.  Psychiatric:        Behavior: Behavior normal.     Assessment/Plan: Left periprosthetic acetabulum fx --plan for touch toe weightbearing for the time being. Will get CT to further elucidate.    Multiple medical problems including A.Fib on eliquis, HTN, breast CA on tamoxifen, and dementia     Lisette Abu, PA-C Orthopedic Surgery [phone] 01/26/2020, 3:26 PM    Reviewed, discussed, and agree with above. Complex left acetabular dislocation with a prosthesis currently present, threatening to become a central dislocation. Plan for touch toe weightbearing, CT to evaluate architecture, I will also plan to involve Dr. Doreatha Martin or Dr. Marcelino Scot for subspecialty trauma evaluation to determine if internal fixation is of any benefit, and also to help make decisions regarding weightbearing status. So far it looks like nonsurgical management is the best course of action given her risk of comorbidities.  We will continue to follow as the CT scan becomes available.  Marchia Bond, MD

## 2020-01-26 NOTE — Progress Notes (Signed)
Orthopedic Tech Progress Note Patient Details:  [name] April 09, 1931 [id number] Level 2 trauma [name], female   DOB: 02/06/1931, 86 y.o.   MRN: [id number]   [name] 01/26/2020, 3:04 PM

## 2020-01-26 NOTE — ED Provider Notes (Signed)
1600: Patient handed off to me by previous EDPA at shift change pending CT head and cervical spine, admission.   See note for full details.   Briefly, 86 yo F here for fall.  Has left periprosthethic acetabular fracture. Orthopedic PA has been consulted, pending recommendations.   Of note, I spoke to nurse at [facility]. States patient was found on ground in bathroom, thinks she tried to go to the toilet on her own.  At baseline patient does not walk, transfers with 1 person assist.   Attempted to contact emergency contact but unable to.   Plan to admit to medicine team.   Physical Exam  BP 118/68   Pulse 80   Temp 98.1 F (36.7 C) (Oral)   Resp 15   Ht 5\' 2"  (1.575 m)   Wt 79.4 kg   SpO2 100%   BMI 32.01 kg/m   ED Course/Procedures   Clinical Course as of Jan 25 1605  Tue Jan 26, 2020  1602 INR(!): 1.3 [CG]  1602 Interval development of mildly displaced periprosthetic left acetabular fracture.    DG Pelvis Portable [CG]    Clinical Course User Index [CG] Kinnie Feil, PA-C    Procedures  MDM    6047: CT head/cervical spine non acute.  Will admit.   Discussed with Dr Tamala Julian who will come see patient.      Kinnie Feil, PA-C 01/26/20 1622    Blanchie Dessert, MD 01/26/20 1836

## 2020-01-26 NOTE — ED Provider Notes (Signed)
[facility] EMERGENCY DEPARTMENT Provider Note   CSN: [id number] Arrival date & time: 01/26/20  1358     History Chief Complaint  Patient presents with  . Level 2 Fall    Refugia Laneve is a 86 y.o. female female who presents VIA ems from [place] home after witnessed fall.  Patient is on Eliquis.  She had laceration to the head.  Patient is at baseline but has some mild dementia.  She repeatedly tells me that she has scoliosis.  She complains of pain in her shoulder and bilateral knees.  There is a level 5 caveat due to dementia  HPI     No past medical history on file.  Patient Active Problem List   Diagnosis Date Noted  . Acetabular fracture ([place]) 01/26/2020  . Leukocytosis 01/26/2020  . Lactic acidosis 01/26/2020  . History of breast cancer 01/26/2020  . Fall 01/26/2020  . Early onset Alzheimer's dementia ([place]) 01/26/2020  . DNR (do not resuscitate) 01/26/2020  . Persistent atrial fibrillation ([place]) 01/26/2020  . Chronic anticoagulation 01/26/2020  . Goals of care, counseling/discussion   . Palliative care by specialist       OB History   No obstetric history on file.     No family history on file.  Social History   Tobacco Use  . Smoking status: Not on file  Substance Use Topics  . Alcohol use: Not on file  . Drug use: Not on file    Home Medications Prior to Admission medications   Not on File    Allergies    Patient has no allergy information on record.  Review of Systems   Review of Systems Ten systems reviewed and are negative for acute change, except as noted in the HPI.   Physical Exam Updated Vital Signs BP 118/68   Pulse 80   Temp 98.1 F (36.7 C) (Oral)   Resp 15   Ht 5\' 2"  (1.575 m)   Wt 79.4 kg   SpO2 100%   BMI 32.01 kg/m   Physical Exam Vitals and nursing note reviewed.  Constitutional:      General: She is not in acute distress.    Appearance: She is well-developed. She is not  diaphoretic.  HENT:     Head: Normocephalic.     Comments: Small abrasion to the R face  Eyes:     General: No scleral icterus.    Conjunctiva/sclera: Conjunctivae normal.  Cardiovascular:     Rate and Rhythm: Normal rate and regular rhythm.     Heart sounds: Normal heart sounds. No murmur heard.  No friction rub. No gallop.   Pulmonary:     Effort: Pulmonary effort is normal. No respiratory distress.     Breath sounds: Normal breath sounds.  Abdominal:     General: Bowel sounds are normal. There is no distension.     Palpations: Abdomen is soft. There is no mass.     Tenderness: There is no abdominal tenderness. There is no guarding.  Musculoskeletal:     Cervical back: Normal range of motion.     Comments: Bruising over the BL knees TTP\   Skin:    General: Skin is warm and dry.  Neurological:     Mental Status: She is alert and oriented to person, place, and time.  Psychiatric:        Behavior: Behavior normal.     ED Results / Procedures / Treatments   Labs (all labs ordered are  listed, but only abnormal results are displayed) Labs Reviewed  COMPREHENSIVE METABOLIC PANEL - Abnormal; Notable for the following components:      Result Value   Glucose, Bld 110 (*)    Albumin 3.2 (*)    AST 13 (*)    All other components within normal limits  CBC - Abnormal; Notable for the following components:   WBC 11.1 (*)    All other components within normal limits  URINALYSIS, ROUTINE W REFLEX MICROSCOPIC - Abnormal; Notable for the following components:   APPearance CLOUDY (*)    Hgb urine dipstick SMALL (*)    Leukocytes,Ua LARGE (*)    WBC, UA >50 (*)    Bacteria, UA FEW (*)    All other components within normal limits  LACTIC ACID, PLASMA - Abnormal; Notable for the following components:   Lactic Acid, Venous 2.2 (*)    All other components within normal limits  PROTIME-INR - Abnormal; Notable for the following components:   Prothrombin Time 16.1 (*)    INR 1.3 (*)     All other components within normal limits  SARS CORONAVIRUS 2 BY RT PCR (HOSPITAL ORDER, [facility])  CULTURE, BLOOD (ROUTINE X 2)  CULTURE, BLOOD (ROUTINE X 2)  URINE CULTURE  ETHANOL  LACTIC ACID, PLASMA  TSH  LACTIC ACID, PLASMA  CBC  BASIC METABOLIC PANEL  I-STAT CHEM 8, ED  SAMPLE TO BLOOD BANK    EKG EKG Interpretation  Date/Time:  Tuesday January 26 2020 15:12:19 EDT Ventricular Rate:  75 PR Interval:    QRS Duration: 89 QT Interval:  395 QTC Calculation: 421 R Axis:   65 Text Interpretation: Atrial fibrillation Low voltage, extremity leads Probable left ventricular hypertrophy Anterior Q waves, possibly due to LVH No previous tracing Confirmed by Blanchie Dessert [phone]) on 01/26/2020 3:55:57 PM   Radiology CT HEAD WO CONTRAST  Result Date: 01/26/2020 CLINICAL DATA:  Fall on blood thinner EXAM: CT HEAD WITHOUT CONTRAST TECHNIQUE: Contiguous axial images were obtained from the base of the skull through the vertex without intravenous contrast. COMPARISON:  None. FINDINGS: Brain: No evidence of acute territorial infarction, hemorrhage, hydrocephalus,extra-axial collection or mass lesion/mass effect. There is dilatation the ventricles and sulci consistent with age-related atrophy. Low-attenuation changes in the deep white matter consistent with small vessel ischemia. Vascular: No hyperdense vessel or unexpected calcification. Skull: The skull is intact. No fracture or focal lesion identified. Sinuses/Orbits: The visualized paranasal sinuses and mastoid air cells are clear. The orbits and globes intact. Other: None Cervical spine: Alignment: Physiologic Skull base and vertebrae: Visualized skull base is intact. No atlanto-occipital dissociation. The vertebral body heights are well maintained. No fracture or pathologic osseous lesion seen. Soft tissues and spinal canal: The visualized paraspinal soft tissues are unremarkable. No prevertebral soft  tissue swelling is seen. The spinal canal is grossly unremarkable, no large epidural collection or significant canal narrowing. Disc levels: Multilevel cervical spine spondylosis is seen with anterior osteophytes disc osteophyte complex and uncovertebral osteophytes most notable at C5-C6 with moderate neural foraminal narrowing and mild central canal stenosis. Upper chest: The lung apices are clear. Thoracic inlet is within normal limits. Other: None IMPRESSION: No acute intracranial abnormality. Findings consistent with age related atrophy and chronic small vessel ischemia No acute fracture or malalignment of the spine. Electronically Signed   By: Prudencio Pair M.D.   On: 01/26/2020 15:54   CT CERVICAL SPINE WO CONTRAST  Result Date: 01/26/2020 CLINICAL DATA:  Fall on  blood thinner EXAM: CT HEAD WITHOUT CONTRAST TECHNIQUE: Contiguous axial images were obtained from the base of the skull through the vertex without intravenous contrast. COMPARISON:  None. FINDINGS: Brain: No evidence of acute territorial infarction, hemorrhage, hydrocephalus,extra-axial collection or mass lesion/mass effect. There is dilatation the ventricles and sulci consistent with age-related atrophy. Low-attenuation changes in the deep white matter consistent with small vessel ischemia. Vascular: No hyperdense vessel or unexpected calcification. Skull: The skull is intact. No fracture or focal lesion identified. Sinuses/Orbits: The visualized paranasal sinuses and mastoid air cells are clear. The orbits and globes intact. Other: None Cervical spine: Alignment: Physiologic Skull base and vertebrae: Visualized skull base is intact. No atlanto-occipital dissociation. The vertebral body heights are well maintained. No fracture or pathologic osseous lesion seen. Soft tissues and spinal canal: The visualized paraspinal soft tissues are unremarkable. No prevertebral soft tissue swelling is seen. The spinal canal is grossly unremarkable, no large  epidural collection or significant canal narrowing. Disc levels: Multilevel cervical spine spondylosis is seen with anterior osteophytes disc osteophyte complex and uncovertebral osteophytes most notable at C5-C6 with moderate neural foraminal narrowing and mild central canal stenosis. Upper chest: The lung apices are clear. Thoracic inlet is within normal limits. Other: None IMPRESSION: No acute intracranial abnormality. Findings consistent with age related atrophy and chronic small vessel ischemia No acute fracture or malalignment of the spine. Electronically Signed   By: Prudencio Pair M.D.   On: 01/26/2020 15:54   DG Pelvis Portable  Result Date: 01/26/2020 CLINICAL DATA:  Fall. EXAM: PORTABLE PELVIS 1-2 VIEWS COMPARISON:  November 08, 2009.  December 15, 2018. FINDINGS: Status post left hip arthroplasty. There is interval development of mildly displaced periprosthetic left acetabular fracture. Right hip is unremarkable. Degenerative changes seen involving both sacroiliac joints. IMPRESSION: Interval development of mildly displaced periprosthetic left acetabular fracture. Electronically Signed   By: Marijo Conception M.D.   On: 01/26/2020 14:50   CT Hip Left Wo Contrast  Result Date: 01/26/2020 CLINICAL DATA:  Pain after fall EXAM: CT OF THE LEFT HIP WITHOUT CONTRAST TECHNIQUE: Multidetector CT imaging of the left hip was performed according to the standard protocol. Multiplanar CT image reconstructions were also generated. COMPARISON:  None. FINDINGS: Bones/Joint/Cartilage The patient is status post left total hip arthroplasty. There is a nonunited fracture seen at the anterior superior acetabulum extending through the anterior column. There is a tiny fracture fragment seen along the lateral acetabular rim. The femoral head component is still well seated within the acetabular component. There appears to be a area periprosthetic lucency seen around the the inferior acetabular cup measuring up to 1 cm, slightly more  progressed since the prior exam. Ligaments Suboptimally assessed by CT. Muscles and Tendons The muscles surrounding the hip appear to be intact. There is mild fatty atrophy of the muscles. There is limited visualization of the tendons, the hamstrings and iliopsoas tendons appear to be intact. Soft tissues Mild subcutaneous edema seen along the lateral aspect of the hip. The bladder is fluid-filled and distended. Scattered vascular calcifications are noted. IMPRESSION: 1. Left total hip arthroplasty with a nonunited nondisplaced fracture of the anterior superior acetabulum extending through the anterior column. 2. Slight interval increase in the periprosthetic lucency in the inferior portion of the acetabular cup, likely consistent with periprosthetic loosening. Electronically Signed   By: Prudencio Pair M.D.   On: 01/26/2020 18:16   DG Chest Port 1 View  Result Date: 01/26/2020 CLINICAL DATA:  Fall. EXAM: PORTABLE CHEST 1 VIEW  COMPARISON:  December 04, 2019.  Sep 18, 2019. FINDINGS: The heart size and mediastinal contours are within normal limits. Both lungs are clear. No pneumothorax or pleural effusion is noted. Stable chronic anterior dislocation involving proximal right humerus is noted. IMPRESSION: No active disease. Electronically Signed   By: Marijo Conception M.D.   On: 01/26/2020 14:47   DG Shoulder Right Port  Result Date: 01/26/2020 CLINICAL DATA:  Fall. EXAM: PORTABLE RIGHT SHOULDER COMPARISON:  Sep 18, 2019. FINDINGS: There is stable anterior dislocation or subluxation of the right proximal humeral head relative to the glenoid labrum consistent with chronic dislocation. Deformity of right humeral head is noted consistent with chronic degenerative change. No acute fracture is noted. IMPRESSION: Stable chronic anterior dislocation or subluxation of right proximal humeral head relative to the glenoid labrum. Chronic degenerative change of right humeral head is noted. Electronically Signed   By: Marijo Conception M.D.   On: 01/26/2020 14:51   DG Knee Left Port  Result Date: 01/26/2020 CLINICAL DATA:  Status post fall. EXAM: PORTABLE LEFT KNEE - 1-2 VIEW COMPARISON:  None. FINDINGS: Status post left total knee arthroplasty. The femoral and tibial components appear to be well situated. No fracture or dislocation is noted. No soft tissue abnormality is noted. IMPRESSION: Status post left total knee arthroplasty. No acute abnormality seen in the left knee. Electronically Signed   By: Marijo Conception M.D.   On: 01/26/2020 14:45   DG Knee Right Port  Result Date: 01/26/2020 CLINICAL DATA:  Fall. EXAM: PORTABLE RIGHT KNEE - 1-2 VIEW COMPARISON:  None. FINDINGS: Status post right total knee arthroplasty. The femoral and tibial components appear to be well situated. No fracture or dislocation is noted. IMPRESSION: Status post right total knee arthroplasty. No acute abnormality seen in the right knee. Electronically Signed   By: Marijo Conception M.D.   On: 01/26/2020 14:48    Procedures Procedures (including critical care time)  Medications Ordered in ED Medications  HYDROcodone-acetaminophen (NORCO/VICODIN) 5-325 MG per tablet 1-2 tablet (has no administration in time range)  morphine 2 MG/ML injection 0.5 mg (has no administration in time range)  senna (SENOKOT) tablet 8.6 mg (8.6 mg Oral Refused 01/26/20 1822)  cefTRIAXone (ROCEPHIN) 1 g in sodium chloride 0.9 % 100 mL IVPB (1 g Intravenous New Bag/Given 01/26/20 1821)  apixaban (ELIQUIS) tablet 2.5 mg (has no administration in time range)  0.9 %  sodium chloride infusion ( Intravenous New Bag/Given 01/26/20 1742)    ED Course  I have reviewed the triage vital signs and the nursing notes.  Pertinent labs & imaging results that were available during my care of the patient were reviewed by me and considered in my medical decision making (see chart for details).  Clinical Course as of Jan 25 2029  Tue Jan 26, 2020  1602 INR(!): 1.3 [CG]  1602 Interval  development of mildly displaced periprosthetic left acetabular fracture.    DG Pelvis Portable [CG]    Clinical Course User Index [CG] Kinnie Feil, PA-C   MDM Rules/Calculators/A&P                          Rhylan Gross is an 86y/o F who presents or fall. I am unsure of her baseline ambulatory status. I ordered and reviewed images including  Plain films of the knees, pelvis, chest, and R shoulder which shows an abnormal finding of a left periprosthetic hip fracture. The remainder  of plain film shows no acute abnormalities.  I also ordered and reviewed images of the CT head and C-spine which are pending I consulted with PA Silvestre Gunner for evaluation of the left periprosthetic fracture.  I personally ordered interpreted and reviewed labs which show CBC with mildly elevated white blood cell count likely acute phase reaction due to injury.  CMP without significant abnormality, urine appears contaminated doubt acute infection.  Covid test is pending.  I have given signout to [facility] who will assume care of the patient..     Final Clinical Impression(s) / ED Diagnoses Final diagnoses:  Fall  Closed displaced fracture of left acetabulum, unspecified portion of acetabulum, initial encounter [facility])    Rx / [place] Orders ED Discharge Orders    None       Margarita Mail, PA-C 01/26/20 2036    Tegeler, Gwenyth Allegra, MD 01/28/20 [phone]

## 2020-01-26 NOTE — ED Triage Notes (Signed)
Patient arrives to ED with [facility] EMS as a level 2 trauma. Per EMS patient lives at [facility] where she fell when getting out of bed. Pt states she was "weak" and "just fell" and was down for "a long time." Pt states that her only pain is in both of her knees. Pt has small laceration to right side of face with bleeding controlled. Pt is alert and oriented and at baseline per NH staff.

## 2020-01-26 NOTE — H&P (Addendum)
History and Physical    [name] MKL:[id number] DOB: [birth year] DOA: 01/26/2020  Referring MD/NP/PA: Carmon Sails, PA-C PCP:( Myrtis Ser, CNM ) noted that they are not this patient's primary care provider Patient coming from: [facility] via EMS  Chief Complaint: Fall  I have personally briefly reviewed patient's old medical records in [place]   HPI: [name] is a 86 y.o. female with medical history significant of hypertension, paroxysmal atrial fibrillation on Eliquis, breast cancer, and dementia presents after a fall.  She has no recollection of the events that led to her falling this morning in the bathroom.  She did sustain a laceration to the right of her scalp.  At this time she only complains of left leg pain when moving it.  Her son notes that he is her healthcare power of attorney due to her history of dementia.  He request that palliative care come on board to help assist with her care.  ED Course: Upon admission into the emergency department patient was seen to be afebrile with respirations 15-23, and all other vital signs maintained.  Labs significant for WBC 11.2, BUN 20, creatinine 0.59, and lactic acid 2.2.  Imaging studies were significant for a left periprosthetic acetabular fracture.  Orthopedics was consulted and recommended obtaining a CT scan of the left hip for further evaluation.  COVID-19 screening still pending.  TRH called to admit.  Review of Systems  Unable to perform ROS: Dementia  Musculoskeletal: Positive for falls.  Psychiatric/Behavioral: Positive for memory loss.     Past Medical History:  Diagnosis Date  . Anxiety   . Atrial fibrillation ([place])   . Breast CA ([place])   . Constipation   . Depression   . Disorder of bone density and structure, unspecified   . Fibromyalgia   . Hyperkalemia   . Hyperlipemia   . Hypertension   . Impaired fasting glucose   . Retention of urine, unspecified   . Scoliosis   .  Thyroid disease          Past Surgical History:  Procedure Laterality Date  . HIP FRACTURE SURGERY       reports that she has never smoked. She has never used smokeless tobacco. She reports that she does not drink alcohol and does not use drugs.       Allergies  Allergen Reactions  . Sulfa Antibiotics Other (See Comments)    Per mar         Family History  Problem Relation Age of Onset  . Hypertension Other      Prior to Admission medications   Not on File    Physical Exam:  Constitutional: Elderly female who appears to be in no acute distress Vitals:   01/26/20 1406 01/26/20 1458 01/26/20 1458 01/26/20 1545  BP:    118/68  Pulse: 87   80  Resp: (!) 23   15  Temp:  98.1 F (36.7 C)    TempSrc:  Oral    SpO2:  98%  100%  Weight:   79.4 kg   Height:   _0  (1.575 m)    Eyes: PERRL, lids and conjunctivae normal ENMT: Mucous membranes are moist. Posterior pharynx clear of any exudate or lesions.  Neck: normal, supple, no masses, no thyromegaly Respiratory: clear to auscultation bilaterally, no wheezing, no crackles. Normal respiratory effort. No accessory muscle use.  Cardiovascular: Regular rate and rhythm, no murmurs / rubs / gallops. No extremity edema. 2+ pedal pulses. No  carotid bruits.  Abdomen: no tenderness, no masses palpated. No hepatosplenomegaly. Bowel sounds positive.  Musculoskeletal: no clubbing / cyanosis.  Left leg is externally rotated and shortened. Skin: Small laceration to the right temple. Neurologic: CN 2-12 grossly intact.  Patient able to move all extremities. Psychiatric: Alert and oriented to person and place.  However confused at why she is here.  Short-term memory appears to be decreased.    Labs on Admission: I have personally reviewed following labs and imaging studies  CBC: Recent Labs  Lab 01/26/20 1404  WBC 11.1*  HGB 13.6  HCT 44.6  MCV 92.5  PLT 944   Basic Metabolic Panel: Recent Labs  Lab  01/26/20 1404  NA 140  K 4.2  CL 103  CO2 25  GLUCOSE 110*  BUN 20  CREATININE 0.59  CALCIUM 9.4   GFR: Estimated Creatinine Clearance: 48.3 mL/min (by C-G formula based on SCr of 0.59 mg/dL). Liver Function Tests: Recent Labs  Lab 01/26/20 1404  AST 13*  ALT 11  ALKPHOS 49  BILITOT 0.5  PROT 6.8  ALBUMIN 3.2*   No results for input(s): LIPASE, AMYLASE in the last 168 hours. No results for input(s): AMMONIA in the last 168 hours. Coagulation Profile: Recent Labs  Lab 01/26/20 1404  INR 1.3*   Cardiac Enzymes: No results for input(s): CKTOTAL, CKMB, CKMBINDEX, TROPONINI in the last 168 hours. BNP (last 3 results) No results for input(s): PROBNP in the last 8760 hours. HbA1C: No results for input(s): HGBA1C in the last 72 hours. CBG: No results for input(s): GLUCAP in the last 168 hours. Lipid Profile: No results for input(s): CHOL, HDL, LDLCALC, TRIG, CHOLHDL, LDLDIRECT in the last 72 hours. Thyroid Function Tests: No results for input(s): TSH, T4TOTAL, FREET4, T3FREE, THYROIDAB in the last 72 hours. Anemia Panel: No results for input(s): VITAMINB12, FOLATE, FERRITIN, TIBC, IRON, RETICCTPCT in the last 72 hours. Urine analysis: No results found for: COLORURINE, APPEARANCEUR, LABSPEC, PHURINE, GLUCOSEU, HGBUR, BILIRUBINUR, KETONESUR, PROTEINUR, UROBILINOGEN, NITRITE, LEUKOCYTESUR Sepsis Labs: No results found for this or any previous visit (from the past 240 hour(s)).   Radiological Exams on Admission: CT HEAD WO CONTRAST  Result Date: 01/26/2020 CLINICAL DATA:  Fall on blood thinner EXAM: CT HEAD WITHOUT CONTRAST TECHNIQUE: Contiguous axial images were obtained from the base of the skull through the vertex without intravenous contrast. COMPARISON:  None. FINDINGS: Brain: No evidence of acute territorial infarction, hemorrhage, hydrocephalus,extra-axial collection or mass lesion/mass effect. There is dilatation the ventricles and sulci consistent with age-related  atrophy. Low-attenuation changes in the deep white matter consistent with small vessel ischemia. Vascular: No hyperdense vessel or unexpected calcification. Skull: The skull is intact. No fracture or focal lesion identified. Sinuses/Orbits: The visualized paranasal sinuses and mastoid air cells are clear. The orbits and globes intact. Other: None Cervical spine: Alignment: Physiologic Skull base and vertebrae: Visualized skull base is intact. No atlanto-occipital dissociation. The vertebral body heights are well maintained. No fracture or pathologic osseous lesion seen. Soft tissues and spinal canal: The visualized paraspinal soft tissues are unremarkable. No prevertebral soft tissue swelling is seen. The spinal canal is grossly unremarkable, no large epidural collection or significant canal narrowing. Disc levels: Multilevel cervical spine spondylosis is seen with anterior osteophytes disc osteophyte complex and uncovertebral osteophytes most notable at C5-C6 with moderate neural foraminal narrowing and mild central canal stenosis. Upper chest: The lung apices are clear. Thoracic inlet is within normal limits. Other: None IMPRESSION: No acute intracranial abnormality. Findings consistent with age  related atrophy and chronic small vessel ischemia No acute fracture or malalignment of the spine. Electronically Signed   By: Prudencio Pair M.D.   On: 01/26/2020 15:54   CT CERVICAL SPINE WO CONTRAST  Result Date: 01/26/2020 CLINICAL DATA:  Fall on blood thinner EXAM: CT HEAD WITHOUT CONTRAST TECHNIQUE: Contiguous axial images were obtained from the base of the skull through the vertex without intravenous contrast. COMPARISON:  None. FINDINGS: Brain: No evidence of acute territorial infarction, hemorrhage, hydrocephalus,extra-axial collection or mass lesion/mass effect. There is dilatation the ventricles and sulci consistent with age-related atrophy. Low-attenuation changes in the deep white matter consistent with small  vessel ischemia. Vascular: No hyperdense vessel or unexpected calcification. Skull: The skull is intact. No fracture or focal lesion identified. Sinuses/Orbits: The visualized paranasal sinuses and mastoid air cells are clear. The orbits and globes intact. Other: None Cervical spine: Alignment: Physiologic Skull base and vertebrae: Visualized skull base is intact. No atlanto-occipital dissociation. The vertebral body heights are well maintained. No fracture or pathologic osseous lesion seen. Soft tissues and spinal canal: The visualized paraspinal soft tissues are unremarkable. No prevertebral soft tissue swelling is seen. The spinal canal is grossly unremarkable, no large epidural collection or significant canal narrowing. Disc levels: Multilevel cervical spine spondylosis is seen with anterior osteophytes disc osteophyte complex and uncovertebral osteophytes most notable at C5-C6 with moderate neural foraminal narrowing and mild central canal stenosis. Upper chest: The lung apices are clear. Thoracic inlet is within normal limits. Other: None IMPRESSION: No acute intracranial abnormality. Findings consistent with age related atrophy and chronic small vessel ischemia No acute fracture or malalignment of the spine. Electronically Signed   By: Prudencio Pair M.D.   On: 01/26/2020 15:54   DG Pelvis Portable  Result Date: 01/26/2020 CLINICAL DATA:  Fall. EXAM: PORTABLE PELVIS 1-2 VIEWS COMPARISON:  November 08, 2009.  December 15, 2018. FINDINGS: Status post left hip arthroplasty. There is interval development of mildly displaced periprosthetic left acetabular fracture. Right hip is unremarkable. Degenerative changes seen involving both sacroiliac joints. IMPRESSION: Interval development of mildly displaced periprosthetic left acetabular fracture. Electronically Signed   By: Marijo Conception M.D.   On: 01/26/2020 14:50   DG Chest Port 1 View  Result Date: 01/26/2020 CLINICAL DATA:  Fall. EXAM: PORTABLE CHEST 1 VIEW  COMPARISON:  December 04, 2019.  Sep 18, 2019. FINDINGS: The heart size and mediastinal contours are within normal limits. Both lungs are clear. No pneumothorax or pleural effusion is noted. Stable chronic anterior dislocation involving proximal right humerus is noted. IMPRESSION: No active disease. Electronically Signed   By: Marijo Conception M.D.   On: 01/26/2020 14:47   DG Shoulder Right Port  Result Date: 01/26/2020 CLINICAL DATA:  Fall. EXAM: PORTABLE RIGHT SHOULDER COMPARISON:  Sep 18, 2019. FINDINGS: There is stable anterior dislocation or subluxation of the right proximal humeral head relative to the glenoid labrum consistent with chronic dislocation. Deformity of right humeral head is noted consistent with chronic degenerative change. No acute fracture is noted. IMPRESSION: Stable chronic anterior dislocation or subluxation of right proximal humeral head relative to the glenoid labrum. Chronic degenerative change of right humeral head is noted. Electronically Signed   By: Marijo Conception M.D.   On: 01/26/2020 14:51   DG Knee Left Port  Result Date: 01/26/2020 CLINICAL DATA:  Status post fall. EXAM: PORTABLE LEFT KNEE - 1-2 VIEW COMPARISON:  None. FINDINGS: Status post left total knee arthroplasty. The femoral and tibial components appear to  be well situated. No fracture or dislocation is noted. No soft tissue abnormality is noted. IMPRESSION: Status post left total knee arthroplasty. No acute abnormality seen in the left knee. Electronically Signed   By: Marijo Conception M.D.   On: 01/26/2020 14:45   DG Knee Right Port  Result Date: 01/26/2020 CLINICAL DATA:  Fall. EXAM: PORTABLE RIGHT KNEE - 1-2 VIEW COMPARISON:  None. FINDINGS: Status post right total knee arthroplasty. The femoral and tibial components appear to be well situated. No fracture or dislocation is noted. IMPRESSION: Status post right total knee arthroplasty. No acute abnormality seen in the right knee. Electronically Signed   By: Marijo Conception M.D.   On: 01/26/2020 14:48    EKG: Independently reviewed.  Atrial fibrillation at 75 bpm Assessment/Plan Left periprostatic acetabulum fracture secondary to fall: Patient reportedly had mechanical fall in the bathroom this morning -Admit to a MedSurg bed -Hip fracture order set utilized -Follow-up CT scan of the left hip -Transitions of care consulted for likely need of placement -Appreciate orthopedic consultative services, follow-up for any further recommendation   Leukocytosis/lactic acidosis: Acute.  WBC elevated 11.1 with initial lactic acid 2.2.  Chest x-ray noted no acute abnormalities.  Suspect secondary to acute fracture as noted above, but question possibility of underlying infection.  Patient did meet SIRS criteria and has previous history of frequent UTIs and bacteremia due to E. coli for which she was hospitalized in July. -Follow-up urinalysis -Trend lactic acid level  (Addendum: Urinalysis was positive for large leukocytes, few bacteria, and greater than 50 WBCs.  This gives concern for the possibility of a UTI.  Review of cultures from previous hospitalization noted that E. coli was sensitive to Rocephin.  Rocephin IV along with  blood and urine cultures ordered.)  Persistent atrial fibrillation on chronic anticoagulation: Patient noted to be in atrial fibrillation, but currently rate controlled. - Held Eliquis initially for possible need of procedure, but will continue with surgery warranted  History of breast cancer:   BRCA positive -Continue Tamoxifen   Hypothyroidism -Add-on TSH -Continue Synthyroid  Early dementia: Currently alert and awake, but unable to tell me what occurred causing her to be admitted into the hospital.  DNR: Present on admission. -Sad care consulted.  DVT prophylaxis: SCDs Code Status: DNR Family Communication: Discussed plan of care with the patient's son present at bedside Disposition Plan: Will likely need rehab or skilled  nursing facility placement Consults called: Orthopedic Admission status: Inpatient status due to acute fracture that may warrant surgical intervention  Norval Morton MD [facility] Pager [phone]   If 7PM-7AM, please contact night-coverage [email or web address] Password [facility]  01/26/2020, 4:17 PM

## 2020-01-27 DIAGNOSIS — S32402A Unspecified fracture of left acetabulum, initial encounter for closed fracture: Secondary | ICD-10-CM

## 2020-01-27 DIAGNOSIS — S32401A Unspecified fracture of right acetabulum, initial encounter for closed fracture: Secondary | ICD-10-CM

## 2020-01-27 DIAGNOSIS — R627 Adult failure to thrive: Secondary | ICD-10-CM

## 2020-01-27 LAB — BASIC METABOLIC PANEL
Anion gap: 11 (ref 5–15)
BUN: 19 mg/dL (ref 8–23)
CO2: 24 mmol/L (ref 22–32)
Calcium: 8.9 mg/dL (ref 8.9–10.3)
Chloride: 106 mmol/L (ref 98–111)
Creatinine, Ser: 0.58 mg/dL (ref 0.44–1.00)
GFR calc Af Amer: >60 mL/min (ref >60)
GFR calc non Af Amer: >60 mL/min (ref >60)
Glucose, Bld: 108 mg/dL — ABNORMAL HIGH (ref 70–99)
Potassium: 3.7 mmol/L (ref 3.5–5.1)
Sodium: 141 mmol/L (ref 135–145)

## 2020-01-27 LAB — CBC
HCT: 41.6 % (ref 36.0–46.0)
Hemoglobin: 12.7 g/dL (ref 12.0–15.0)
MCH: 28 pg (ref 26.0–34.0)
MCHC: 30.5 g/dL (ref 30.0–36.0)
MCV: 91.6 fL (ref 80.0–100.0)
Platelets: 264 10*3/uL (ref 150–400)
RBC: 4.54 MIL/uL (ref 3.87–5.11)
RDW: 14.2 % (ref 11.5–15.5)
WBC: 10.7 10*3/uL — ABNORMAL HIGH (ref 4.0–10.5)
nRBC: 0 % (ref 0.0–0.2)

## 2020-01-27 LAB — LACTIC ACID, PLASMA: Lactic Acid, Venous: 1 mmol/L (ref 0.5–1.9)

## 2020-01-27 NOTE — Progress Notes (Signed)
[facility]                                                                              Patient Demographics  [name], is a 86 y.o. female, DOB - 04/20/1931, HOZ:[id number]  Admit date - 01/26/2020   Admitting Physician Norval Morton, MD  Outpatient Primary MD for the patient is Dr. Serita Grammes  Outpatient specialists:   LOS - 1  days   Medical records reviewed and are as summarized below:    Chief Complaint  Patient presents with  . Level 2 Fall       Brief summary   Patient is 86 year old female with history of hypertension, paroxysmal A. fib on Eliquis, breast cancer, dementia presented from [facility] after mechanical fall.  Patient had no recollection of the events that led to her falling on the morning of admission in the bathroom.  She sustained a laceration to the right side of the scalp.  Patient complained of left leg pain when moving it.  CT of the left hip showed nonunited nondisplaced fracture of the anterior superior testicle extending to the anterior column, Periprosthetic loosening Orthopedics was consulted, seen by Dr. Mardelle Matte, recommended nonsurgical management.  Patient son, [name], requested palliative    Assessment & Plan    Principal Problem: Left periprostatic acetabular fracture ([place]) -  secondary to mechanical fall in the bathroom -Appreciate orthopedics and palliative care recommendations -Continue pain control, bowel regimen, will follow palliative care recommendation regarding hospice  Active Problems: SIRS with acute lower UTI -Patient presented with lactic acidosis, leukocytosis, tachycardia -Positive for UTI, continue IV Rocephin, follow cultures  History of breast CA -BRCA positive, continue tamoxifen  Hypothyroidism Continue Synthroid  Dementia -Appreciate palliative medicine recommendations   Obesity Estimated body mass index is 32.01 kg/m as calculated from the following:   Height as of this  encounter: 5' 2"  (1.575 m).   Weight as of this encounter: 79.4 kg.  Code Status: DNR DVT Prophylaxis:  SCD's Family Communication: Discussed all imaging results, lab results, explained to the patient's son, Ed Pharmacist, community      Disposition Plan:     Status is: Inpatient  Remains inpatient appropriate because:Inpatient level of care appropriate due to severity of illness   Dispo: The patient is from: SNF              Anticipated d/c is to: SNF              Anticipated d/c date is: 3 days              Patient currently is not medically stable to d/c.      Time Spent in minutes   66mns    Procedures:  None   Consultants:   Ortho  Palliative   Antimicrobials:   Anti-infectives (From admission, onward)   Start     Dose/Rate Route Frequency Ordered Stop   01/26/20 1800  cefTRIAXone (ROCEPHIN) 1 g in sodium chloride 0.9 % 100 mL IVPB        1 g 200 mL/hr over 30 Minutes Intravenous Every 24 hours 01/26/20 1738  Medications  Scheduled Meds: . apixaban  2.5 mg Oral BID  . senna  1 tablet Oral Daily   Continuous Infusions: . cefTRIAXone (ROCEPHIN)  IV Stopped (01/26/20 1900)   PRN Meds:.HYDROcodone-acetaminophen, morphine injection      Subjective:   [name] was seen and examined today.  Tearful that she is scared of having a fall. No acute issues, no active nausea vomiting or diarrhea.  No abdominal pain.*No fevers  Objective:   Vitals:   01/27/20 0415 01/27/20 0430 01/27/20 0445 01/27/20 0600  BP: 104/68 115/72 119/64 119/86  Pulse: 85 85 (!) 102 (!) 105  Resp: 12 20 (!) 22 19  Temp:      TempSrc:      SpO2: 100% 100% 100% 95%  Weight:      Height:        Intake/Output Summary (Last 24 hours) at 01/27/2020 1138 Last data filed at 01/26/2020 1900 Gross per 24 hour  Intake 100 ml  Output 200 ml  Net -100 ml     Wt Readings from Last 3 Encounters:  01/26/20 79.4 kg     Exam  General: Alert and awake,  dementia  Cardiovascular: S1 S2 auscultated, no murmurs, RRR  Respiratory: Clear to auscultation bilaterally  Gastrointestinal: Soft, nontender, nondistended, + bowel sounds  Ext: no pedal edema bilaterally  Neuro: no new deficits  Musculoskeletal: No digital cyanosis, clubbing  Skin: No rashes  Psych: dementia   Data Reviewed:  I have personally reviewed following labs and imaging studies  Micro Results Recent Results (from the past 240 hour(s))  SARS Coronavirus 2 by RT PCR (hospital order, performed in [place] hospital lab) Nasopharyngeal Nasopharyngeal Swab     Status: None   Collection Time: 01/26/20  4:06 PM   Specimen: Nasopharyngeal Swab  Result Value Ref Range Status   SARS Coronavirus 2 NEGATIVE NEGATIVE Final    Comment: (NOTE) SARS-CoV-2 target nucleic acids are NOT DETECTED.  The SARS-CoV-2 RNA is generally detectable in upper and lower respiratory specimens during the acute phase of infection. The lowest concentration of SARS-CoV-2 viral copies this assay can detect is 250 copies / mL. A negative result does not preclude SARS-CoV-2 infection and should not be used as the sole basis for treatment or other patient management decisions.  A negative result may occur with improper specimen collection / handling, submission of specimen other than nasopharyngeal swab, presence of viral mutation(s) within the areas targeted by this assay, and inadequate number of viral copies (<250 copies / mL). A negative result must be combined with clinical observations, patient history, and epidemiological information.  Fact Sheet for Patients:   StrictlyIdeas.no  Fact Sheet for Healthcare Providers: BankingDealers.co.za  This test is not yet approved or  cleared by the [place] FDA and has been authorized for detection and/or diagnosis of SARS-CoV-2 by FDA under an Emergency Use Authorization (EUA).  This EUA will  remain in effect (meaning this test can be used) for the duration of the COVID-19 declaration under Section 564(b)(1) of the Act, 21 U.S.C. section 360bbb-3(b)(1), unless the authorization is terminated or revoked sooner.  Performed at [facility], [place] [address], [place], [place] [address]     Radiology Reports CT HEAD WO CONTRAST  Result Date: 01/26/2020 CLINICAL DATA:  Fall on blood thinner EXAM: CT HEAD WITHOUT CONTRAST TECHNIQUE: Contiguous axial images were obtained from the base of the skull through the vertex without intravenous contrast. COMPARISON:  None. FINDINGS: Brain: No evidence of  acute territorial infarction, hemorrhage, hydrocephalus,extra-axial collection or mass lesion/mass effect. There is dilatation the ventricles and sulci consistent with age-related atrophy. Low-attenuation changes in the deep white matter consistent with small vessel ischemia. Vascular: No hyperdense vessel or unexpected calcification. Skull: The skull is intact. No fracture or focal lesion identified. Sinuses/Orbits: The visualized paranasal sinuses and mastoid air cells are clear. The orbits and globes intact. Other: None Cervical spine: Alignment: Physiologic Skull base and vertebrae: Visualized skull base is intact. No atlanto-occipital dissociation. The vertebral body heights are well maintained. No fracture or pathologic osseous lesion seen. Soft tissues and spinal canal: The visualized paraspinal soft tissues are unremarkable. No prevertebral soft tissue swelling is seen. The spinal canal is grossly unremarkable, no large epidural collection or significant canal narrowing. Disc levels: Multilevel cervical spine spondylosis is seen with anterior osteophytes disc osteophyte complex and uncovertebral osteophytes most notable at C5-C6 with moderate neural foraminal narrowing and mild central canal stenosis. Upper chest: The lung apices are clear. Thoracic inlet is within normal limits. Other: None  IMPRESSION: No acute intracranial abnormality. Findings consistent with age related atrophy and chronic small vessel ischemia No acute fracture or malalignment of the spine. Electronically Signed   By: Prudencio Pair M.D.   On: 01/26/2020 15:54   CT CERVICAL SPINE WO CONTRAST  Result Date: 01/26/2020 CLINICAL DATA:  Fall on blood thinner EXAM: CT HEAD WITHOUT CONTRAST TECHNIQUE: Contiguous axial images were obtained from the base of the skull through the vertex without intravenous contrast. COMPARISON:  None. FINDINGS: Brain: No evidence of acute territorial infarction, hemorrhage, hydrocephalus,extra-axial collection or mass lesion/mass effect. There is dilatation the ventricles and sulci consistent with age-related atrophy. Low-attenuation changes in the deep white matter consistent with small vessel ischemia. Vascular: No hyperdense vessel or unexpected calcification. Skull: The skull is intact. No fracture or focal lesion identified. Sinuses/Orbits: The visualized paranasal sinuses and mastoid air cells are clear. The orbits and globes intact. Other: None Cervical spine: Alignment: Physiologic Skull base and vertebrae: Visualized skull base is intact. No atlanto-occipital dissociation. The vertebral body heights are well maintained. No fracture or pathologic osseous lesion seen. Soft tissues and spinal canal: The visualized paraspinal soft tissues are unremarkable. No prevertebral soft tissue swelling is seen. The spinal canal is grossly unremarkable, no large epidural collection or significant canal narrowing. Disc levels: Multilevel cervical spine spondylosis is seen with anterior osteophytes disc osteophyte complex and uncovertebral osteophytes most notable at C5-C6 with moderate neural foraminal narrowing and mild central canal stenosis. Upper chest: The lung apices are clear. Thoracic inlet is within normal limits. Other: None IMPRESSION: No acute intracranial abnormality. Findings consistent with age  related atrophy and chronic small vessel ischemia No acute fracture or malalignment of the spine. Electronically Signed   By: Prudencio Pair M.D.   On: 01/26/2020 15:54   DG Pelvis Portable  Result Date: 01/26/2020 CLINICAL DATA:  Fall. EXAM: PORTABLE PELVIS 1-2 VIEWS COMPARISON:  November 08, 2009.  December 15, 2018. FINDINGS: Status post left hip arthroplasty. There is interval development of mildly displaced periprosthetic left acetabular fracture. Right hip is unremarkable. Degenerative changes seen involving both sacroiliac joints. IMPRESSION: Interval development of mildly displaced periprosthetic left acetabular fracture. Electronically Signed   By: Marijo Conception M.D.   On: 01/26/2020 14:50   CT Hip Left Wo Contrast  Result Date: 01/26/2020 CLINICAL DATA:  Pain after fall EXAM: CT OF THE LEFT HIP WITHOUT CONTRAST TECHNIQUE: Multidetector CT imaging of the left hip was performed according to  the standard protocol. Multiplanar CT image reconstructions were also generated. COMPARISON:  None. FINDINGS: Bones/Joint/Cartilage The patient is status post left total hip arthroplasty. There is a nonunited fracture seen at the anterior superior acetabulum extending through the anterior column. There is a tiny fracture fragment seen along the lateral acetabular rim. The femoral head component is still well seated within the acetabular component. There appears to be a area periprosthetic lucency seen around the the inferior acetabular cup measuring up to 1 cm, slightly more progressed since the prior exam. Ligaments Suboptimally assessed by CT. Muscles and Tendons The muscles surrounding the hip appear to be intact. There is mild fatty atrophy of the muscles. There is limited visualization of the tendons, the hamstrings and iliopsoas tendons appear to be intact. Soft tissues Mild subcutaneous edema seen along the lateral aspect of the hip. The bladder is fluid-filled and distended. Scattered vascular calcifications are  noted. IMPRESSION: 1. Left total hip arthroplasty with a nonunited nondisplaced fracture of the anterior superior acetabulum extending through the anterior column. 2. Slight interval increase in the periprosthetic lucency in the inferior portion of the acetabular cup, likely consistent with periprosthetic loosening. Electronically Signed   By: Prudencio Pair M.D.   On: 01/26/2020 18:16   DG Chest Port 1 View  Result Date: 01/26/2020 CLINICAL DATA:  Fall. EXAM: PORTABLE CHEST 1 VIEW COMPARISON:  December 04, 2019.  Sep 18, 2019. FINDINGS: The heart size and mediastinal contours are within normal limits. Both lungs are clear. No pneumothorax or pleural effusion is noted. Stable chronic anterior dislocation involving proximal right humerus is noted. IMPRESSION: No active disease. Electronically Signed   By: Marijo Conception M.D.   On: 01/26/2020 14:47   DG Shoulder Right Port  Result Date: 01/26/2020 CLINICAL DATA:  Fall. EXAM: PORTABLE RIGHT SHOULDER COMPARISON:  Sep 18, 2019. FINDINGS: There is stable anterior dislocation or subluxation of the right proximal humeral head relative to the glenoid labrum consistent with chronic dislocation. Deformity of right humeral head is noted consistent with chronic degenerative change. No acute fracture is noted. IMPRESSION: Stable chronic anterior dislocation or subluxation of right proximal humeral head relative to the glenoid labrum. Chronic degenerative change of right humeral head is noted. Electronically Signed   By: Marijo Conception M.D.   On: 01/26/2020 14:51   DG Knee Left Port  Result Date: 01/26/2020 CLINICAL DATA:  Status post fall. EXAM: PORTABLE LEFT KNEE - 1-2 VIEW COMPARISON:  None. FINDINGS: Status post left total knee arthroplasty. The femoral and tibial components appear to be well situated. No fracture or dislocation is noted. No soft tissue abnormality is noted. IMPRESSION: Status post left total knee arthroplasty. No acute abnormality seen in the left knee.  Electronically Signed   By: Marijo Conception M.D.   On: 01/26/2020 14:45   DG Knee Right Port  Result Date: 01/26/2020 CLINICAL DATA:  Fall. EXAM: PORTABLE RIGHT KNEE - 1-2 VIEW COMPARISON:  None. FINDINGS: Status post right total knee arthroplasty. The femoral and tibial components appear to be well situated. No fracture or dislocation is noted. IMPRESSION: Status post right total knee arthroplasty. No acute abnormality seen in the right knee. Electronically Signed   By: Marijo Conception M.D.   On: 01/26/2020 14:48    Lab Data:  CBC: Recent Labs  Lab 01/26/20 1404 01/27/20 0114  WBC 11.1* 10.7*  HGB 13.6 12.7  HCT 44.6 41.6  MCV 92.5 91.6  PLT 273 220   Basic Metabolic Panel:  Recent Labs  Lab 01/26/20 1404 01/27/20 0114  NA 140 141  K 4.2 3.7  CL 103 106  CO2 25 24  GLUCOSE 110* 108*  BUN 20 19  CREATININE 0.59 0.58  CALCIUM 9.4 8.9   GFR: Estimated Creatinine Clearance: 48.3 mL/min (by C-G formula based on SCr of 0.58 mg/dL). Liver Function Tests: Recent Labs  Lab 01/26/20 1404  AST 13*  ALT 11  ALKPHOS 49  BILITOT 0.5  PROT 6.8  ALBUMIN 3.2*   No results for input(s): LIPASE, AMYLASE in the last 168 hours. No results for input(s): AMMONIA in the last 168 hours. Coagulation Profile: Recent Labs  Lab 01/26/20 1404  INR 1.3*   Cardiac Enzymes: No results for input(s): CKTOTAL, CKMB, CKMBINDEX, TROPONINI in the last 168 hours. BNP (last 3 results) No results for input(s): PROBNP in the last 8760 hours. HbA1C: No results for input(s): HGBA1C in the last 72 hours. CBG: No results for input(s): GLUCAP in the last 168 hours. Lipid Profile: No results for input(s): CHOL, HDL, LDLCALC, TRIG, CHOLHDL, LDLDIRECT in the last 72 hours. Thyroid Function Tests: No results for input(s): TSH, T4TOTAL, FREET4, T3FREE, THYROIDAB in the last 72 hours. Anemia Panel: No results for input(s): VITAMINB12, FOLATE, FERRITIN, TIBC, IRON, RETICCTPCT in the last 72  hours. Urine analysis:    Component Value Date/Time   COLORURINE YELLOW 01/26/2020 1606   APPEARANCEUR CLOUDY (A) 01/26/2020 1606   LABSPEC 1.011 01/26/2020 1606   PHURINE 5.0 01/26/2020 1606   GLUCOSEU NEGATIVE 01/26/2020 1606   HGBUR SMALL (A) 01/26/2020 1606   BILIRUBINUR NEGATIVE 01/26/2020 1606   KETONESUR NEGATIVE 01/26/2020 1606   PROTEINUR NEGATIVE 01/26/2020 1606   NITRITE NEGATIVE 01/26/2020 1606   LEUKOCYTESUR LARGE (A) 01/26/2020 1606     Joeli Fenner M.D. [facility] 01/27/2020, 11:38 AM   Call night coverage person covering after 7pm

## 2020-01-27 NOTE — ED Notes (Signed)
Pt ate 75% of her dinner

## 2020-01-27 NOTE — Progress Notes (Signed)
Subjective:  Patient pleasantly confused, laying in bed. States "Im cold". Scared she is going to fall out of bed.   Objective:  PE: VITALS:   Vitals:   01/27/20 0415 01/27/20 0430 01/27/20 0445 01/27/20 0600  BP: 104/68 115/72 119/64 119/86  Pulse: 85 85 (!) 102 (!) 105  Resp: 12 20 (!) 22 19  Temp:      TempSrc:      SpO2: 100% 100% 100% 95%  Weight:      Height:       General: frail appearing female, laying in bed, in no acute distress Resp: No use of accessory musculature MSK: EHL and FHL intact, able to flex and extend all toes of left foot. Pain with internal and external rotation. + DP pulse, 0 PT pulse. Distal sensation intact.    LABS  Results for orders placed or performed during the hospital encounter of 01/26/20 (from the past 24 hour(s))  Comprehensive metabolic panel     Status: Abnormal   Collection Time: 01/26/20  2:04 PM  Result Value Ref Range   Sodium 140 135 - 145 mmol/L   Potassium 4.2 3.5 - 5.1 mmol/L   Chloride 103 98 - 111 mmol/L   CO2 25 22 - 32 mmol/L   Glucose, Bld 110 (H) 70 - 99 mg/dL   BUN 20 8 - 23 mg/dL   Creatinine, Ser 0.59 0.44 - 1.00 mg/dL   Calcium 9.4 8.9 - 10.3 mg/dL   Total Protein 6.8 6.5 - 8.1 g/dL   Albumin 3.2 (L) 3.5 - 5.0 g/dL   AST 13 (L) 15 - 41 U/L   ALT 11 0 - 44 U/L   Alkaline Phosphatase 49 38 - 126 U/L   Total Bilirubin 0.5 0.3 - 1.2 mg/dL   GFR calc non Af Amer >60 >60 mL/min   GFR calc Af Amer >60 >60 mL/min   Anion gap 12 5 - 15  CBC     Status: Abnormal   Collection Time: 01/26/20  2:04 PM  Result Value Ref Range   WBC 11.1 (H) 4.0 - 10.5 K/uL   RBC 4.82 3.87 - 5.11 MIL/uL   Hemoglobin 13.6 12.0 - 15.0 g/dL   HCT 44.6 36 - 46 %   MCV 92.5 80.0 - 100.0 fL   MCH 28.2 26.0 - 34.0 pg   MCHC 30.5 30.0 - 36.0 g/dL   RDW 14.1 11.5 - 15.5 %   Platelets 273 150 - 400 K/uL   nRBC 0.0 0.0 - 0.2 %  Ethanol     Status: None   Collection Time: 01/26/20  2:04 PM  Result Value Ref Range   Alcohol, Ethyl (B)  <10 <10 mg/dL  Lactic acid, plasma     Status: Abnormal   Collection Time: 01/26/20  2:04 PM  Result Value Ref Range   Lactic Acid, Venous 2.2 (HH) 0.5 - 1.9 mmol/L  Protime-INR     Status: Abnormal   Collection Time: 01/26/20  2:04 PM  Result Value Ref Range   Prothrombin Time 16.1 (H) 11.4 - 15.2 seconds   INR 1.3 (H) 0.8 - 1.2  Sample to Blood Bank     Status: None   Collection Time: 01/26/20  2:15 PM  Result Value Ref Range   Blood Bank Specimen SAMPLE AVAILABLE FOR TESTING    Sample Expiration      01/27/2020,2359 Performed at [facility], [address] [place], [place] [address]   Urinalysis, Routine  w reflex microscopic Urine, Clean Catch     Status: Abnormal   Collection Time: 01/26/20  4:06 PM  Result Value Ref Range   Color, Urine YELLOW YELLOW   APPearance CLOUDY (A) CLEAR   Specific Gravity, Urine 1.011 1.005 - 1.030   pH 5.0 5.0 - 8.0   Glucose, UA NEGATIVE NEGATIVE mg/dL   Hgb urine dipstick SMALL (A) NEGATIVE   Bilirubin Urine NEGATIVE NEGATIVE   Ketones, ur NEGATIVE NEGATIVE mg/dL   Protein, ur NEGATIVE NEGATIVE mg/dL   Nitrite NEGATIVE NEGATIVE   Leukocytes,Ua LARGE (A) NEGATIVE   RBC / HPF 11-20 0 - 5 RBC/hpf   WBC, UA >50 (H) 0 - 5 WBC/hpf   Bacteria, UA FEW (A) NONE SEEN   Squamous Epithelial / LPF 0-5 0 - 5   Budding Yeast PRESENT   SARS Coronavirus 2 by RT PCR (hospital order, performed in [facility] hospital lab) Nasopharyngeal Nasopharyngeal Swab     Status: None   Collection Time: 01/26/20  4:06 PM   Specimen: Nasopharyngeal Swab  Result Value Ref Range   SARS Coronavirus 2 NEGATIVE NEGATIVE  Lactic acid, plasma     Status: None   Collection Time: 01/26/20  5:45 PM  Result Value Ref Range   Lactic Acid, Venous 1.7 0.5 - 1.9 mmol/L  Lactic acid, plasma     Status: None   Collection Time: 01/27/20  1:14 AM  Result Value Ref Range   Lactic Acid, Venous 1.0 0.5 - 1.9 mmol/L  CBC     Status: Abnormal   Collection Time: 01/27/20  1:14 AM   Result Value Ref Range   WBC 10.7 (H) 4.0 - 10.5 K/uL   RBC 4.54 3.87 - 5.11 MIL/uL   Hemoglobin 12.7 12.0 - 15.0 g/dL   HCT 41.6 36 - 46 %   MCV 91.6 80.0 - 100.0 fL   MCH 28.0 26.0 - 34.0 pg   MCHC 30.5 30.0 - 36.0 g/dL   RDW 14.2 11.5 - 15.5 %   Platelets 264 150 - 400 K/uL   nRBC 0.0 0.0 - 0.2 %  Basic metabolic panel     Status: Abnormal   Collection Time: 01/27/20  1:14 AM  Result Value Ref Range   Sodium 141 135 - 145 mmol/L   Potassium 3.7 3.5 - 5.1 mmol/L   Chloride 106 98 - 111 mmol/L   CO2 24 22 - 32 mmol/L   Glucose, Bld 108 (H) 70 - 99 mg/dL   BUN 19 8 - 23 mg/dL   Creatinine, Ser 0.58 0.44 - 1.00 mg/dL   Calcium 8.9 8.9 - 10.3 mg/dL   GFR calc non Af Amer >60 >60 mL/min   GFR calc Af Amer >60 >60 mL/min   Anion gap 11 5 - 15    CT HEAD WO CONTRAST  Result Date: 01/26/2020 CLINICAL DATA:  Fall on blood thinner EXAM: CT HEAD WITHOUT CONTRAST TECHNIQUE: Contiguous axial images were obtained from the base of the skull through the vertex without intravenous contrast. COMPARISON:  None. FINDINGS: Brain: No evidence of acute territorial infarction, hemorrhage, hydrocephalus,extra-axial collection or mass lesion/mass effect. There is dilatation the ventricles and sulci consistent with age-related atrophy. Low-attenuation changes in the deep white matter consistent with small vessel ischemia. Vascular: No hyperdense vessel or unexpected calcification. Skull: The skull is intact. No fracture or focal lesion identified. Sinuses/Orbits: The visualized paranasal sinuses and mastoid air cells are clear. The orbits and globes intact. Other: None Cervical spine: Alignment: Physiologic  Skull base and vertebrae: Visualized skull base is intact. No atlanto-occipital dissociation. The vertebral body heights are well maintained. No fracture or pathologic osseous lesion seen. Soft tissues and spinal canal: The visualized paraspinal soft tissues are unremarkable. No prevertebral soft tissue  swelling is seen. The spinal canal is grossly unremarkable, no large epidural collection or significant canal narrowing. Disc levels: Multilevel cervical spine spondylosis is seen with anterior osteophytes disc osteophyte complex and uncovertebral osteophytes most notable at C5-C6 with moderate neural foraminal narrowing and mild central canal stenosis. Upper chest: The lung apices are clear. Thoracic inlet is within normal limits. Other: None IMPRESSION: No acute intracranial abnormality. Findings consistent with age related atrophy and chronic small vessel ischemia No acute fracture or malalignment of the spine. Electronically Signed   By: Prudencio Pair M.D.   On: 01/26/2020 15:54   CT CERVICAL SPINE WO CONTRAST  Result Date: 01/26/2020 CLINICAL DATA:  Fall on blood thinner EXAM: CT HEAD WITHOUT CONTRAST TECHNIQUE: Contiguous axial images were obtained from the base of the skull through the vertex without intravenous contrast. COMPARISON:  None. FINDINGS: Brain: No evidence of acute territorial infarction, hemorrhage, hydrocephalus,extra-axial collection or mass lesion/mass effect. There is dilatation the ventricles and sulci consistent with age-related atrophy. Low-attenuation changes in the deep white matter consistent with small vessel ischemia. Vascular: No hyperdense vessel or unexpected calcification. Skull: The skull is intact. No fracture or focal lesion identified. Sinuses/Orbits: The visualized paranasal sinuses and mastoid air cells are clear. The orbits and globes intact. Other: None Cervical spine: Alignment: Physiologic Skull base and vertebrae: Visualized skull base is intact. No atlanto-occipital dissociation. The vertebral body heights are well maintained. No fracture or pathologic osseous lesion seen. Soft tissues and spinal canal: The visualized paraspinal soft tissues are unremarkable. No prevertebral soft tissue swelling is seen. The spinal canal is grossly unremarkable, no large epidural  collection or significant canal narrowing. Disc levels: Multilevel cervical spine spondylosis is seen with anterior osteophytes disc osteophyte complex and uncovertebral osteophytes most notable at C5-C6 with moderate neural foraminal narrowing and mild central canal stenosis. Upper chest: The lung apices are clear. Thoracic inlet is within normal limits. Other: None IMPRESSION: No acute intracranial abnormality. Findings consistent with age related atrophy and chronic small vessel ischemia No acute fracture or malalignment of the spine. Electronically Signed   By: Prudencio Pair M.D.   On: 01/26/2020 15:54   DG Pelvis Portable  Result Date: 01/26/2020 CLINICAL DATA:  Fall. EXAM: PORTABLE PELVIS 1-2 VIEWS COMPARISON:  November 08, 2009.  December 15, 2018. FINDINGS: Status post left hip arthroplasty. There is interval development of mildly displaced periprosthetic left acetabular fracture. Right hip is unremarkable. Degenerative changes seen involving both sacroiliac joints. IMPRESSION: Interval development of mildly displaced periprosthetic left acetabular fracture. Electronically Signed   By: Marijo Conception M.D.   On: 01/26/2020 14:50   CT Hip Left Wo Contrast  Result Date: 01/26/2020 CLINICAL DATA:  Pain after fall EXAM: CT OF THE LEFT HIP WITHOUT CONTRAST TECHNIQUE: Multidetector CT imaging of the left hip was performed according to the standard protocol. Multiplanar CT image reconstructions were also generated. COMPARISON:  None. FINDINGS: Bones/Joint/Cartilage The patient is status post left total hip arthroplasty. There is a nonunited fracture seen at the anterior superior acetabulum extending through the anterior column. There is a tiny fracture fragment seen along the lateral acetabular rim. The femoral head component is still well seated within the acetabular component. There appears to be a area periprosthetic lucency seen  around the the inferior acetabular cup measuring up to 1 cm, slightly more  progressed since the prior exam. Ligaments Suboptimally assessed by CT. Muscles and Tendons The muscles surrounding the hip appear to be intact. There is mild fatty atrophy of the muscles. There is limited visualization of the tendons, the hamstrings and iliopsoas tendons appear to be intact. Soft tissues Mild subcutaneous edema seen along the lateral aspect of the hip. The bladder is fluid-filled and distended. Scattered vascular calcifications are noted. IMPRESSION: 1. Left total hip arthroplasty with a nonunited nondisplaced fracture of the anterior superior acetabulum extending through the anterior column. 2. Slight interval increase in the periprosthetic lucency in the inferior portion of the acetabular cup, likely consistent with periprosthetic loosening. Electronically Signed   By: Prudencio Pair M.D.   On: 01/26/2020 18:16   DG Chest Port 1 View  Result Date: 01/26/2020 CLINICAL DATA:  Fall. EXAM: PORTABLE CHEST 1 VIEW COMPARISON:  December 04, 2019.  Sep 18, 2019. FINDINGS: The heart size and mediastinal contours are within normal limits. Both lungs are clear. No pneumothorax or pleural effusion is noted. Stable chronic anterior dislocation involving proximal right humerus is noted. IMPRESSION: No active disease. Electronically Signed   By: Marijo Conception M.D.   On: 01/26/2020 14:47   DG Shoulder Right Port  Result Date: 01/26/2020 CLINICAL DATA:  Fall. EXAM: PORTABLE RIGHT SHOULDER COMPARISON:  Sep 18, 2019. FINDINGS: There is stable anterior dislocation or subluxation of the right proximal humeral head relative to the glenoid labrum consistent with chronic dislocation. Deformity of right humeral head is noted consistent with chronic degenerative change. No acute fracture is noted. IMPRESSION: Stable chronic anterior dislocation or subluxation of right proximal humeral head relative to the glenoid labrum. Chronic degenerative change of right humeral head is noted. Electronically Signed   By: Marijo Conception M.D.   On: 01/26/2020 14:51   DG Knee Left Port  Result Date: 01/26/2020 CLINICAL DATA:  Status post fall. EXAM: PORTABLE LEFT KNEE - 1-2 VIEW COMPARISON:  None. FINDINGS: Status post left total knee arthroplasty. The femoral and tibial components appear to be well situated. No fracture or dislocation is noted. No soft tissue abnormality is noted. IMPRESSION: Status post left total knee arthroplasty. No acute abnormality seen in the left knee. Electronically Signed   By: Marijo Conception M.D.   On: 01/26/2020 14:45   DG Knee Right Port  Result Date: 01/26/2020 CLINICAL DATA:  Fall. EXAM: PORTABLE RIGHT KNEE - 1-2 VIEW COMPARISON:  None. FINDINGS: Status post right total knee arthroplasty. The femoral and tibial components appear to be well situated. No fracture or dislocation is noted. IMPRESSION: Status post right total knee arthroplasty. No acute abnormality seen in the right knee. Electronically Signed   By: Marijo Conception M.D.   On: 01/26/2020 14:48    Assessment/Plan: Principal Problem:   Acetabular fracture ([place]) Active Problems:   Leukocytosis   Lactic acidosis   History of breast cancer   Fall   Early onset Alzheimer's dementia ([place])   DNR (do not resuscitate)   Persistent atrial fibrillation ([facility])   Chronic anticoagulation   Left periprosthetic acetabular fracture: - CT scan shows continued L acetabular fracture, patient does not have acetabular prosthesis as she only has histrory of L hip hemiarthroplasty - will plan for non-operative treatment of this fracture, I spoke with son Ed and let him know assessment and non-surgical plan, continue with comfort care plan as per Palliative Care -  okay for bed mobility   Contact information:   Weekdays 8-5 [address], [place] [phone] A fter hours and holidays please check [email or web address] for group call information for [facility]  Ventura Bruns 01/27/2020, 8:28 AM

## 2020-01-27 NOTE — ED Notes (Signed)
Admitting NP at bedside

## 2020-01-27 NOTE — Progress Notes (Addendum)
Daily Progress Note   Patient Name: [name]       Date: 01/27/2020 DOB: Jul 16, 1930  Age: 86 y.o. MRN#: [id number] Attending Physician: Mendel Corning, MD Primary Care Physician: Myrtis Ser, CNM Admit Date: 01/26/2020   HPI/Patient Profile: 86 y.o. female  with past medical history of hypertension, paroxysmal atrial fibrillation on Eliquis, breast cancer, and dementia presents to [facility] emergency department on 01/26/2020 with after a fall.  [name] course: Labs significant for WBC 11.2, BUN 20, creatinine 0.59, and lactic acid 2.2. Imaging studies were significant for a left periprosthetic acetabular fracture. Orthopedics was consulted and recommended obtaining a CT scan of the left hip for further evaluation.   Of note, patient was hospitalized 7/23-7/30 with sepsis secondary to pneumonia. Discharged to [facility] for rehab. Prior to that she had been a long-term resident of [facility].   Primary decision maker: [name] (son and HCPOA) [phone] Patient is unable to independently make complex medical decisions.   Subjective:  Patient is lying on [name] stretcher in the hallway. Nurse tech is feeding her applesauce. [name] staff notes state she has been refusing lab draws. She denies pain other than related to her scoliosis.   I spoke with son [name] by phone at length. He again states he feels she would benefit from hospice care. He thinks her SNF status is long-term. We discussed that if that is the case, she could receive hospice care at the SNF.   [name] does not think his mother will do well with additional rehab. She has been resistant to bed to chair transfers since she has been at [facility].   Update--I have reached out to [facility] and confirmed patient is long-term status at [facility]  with [name], and he wishes to proceed with hospice referral.  After speaking with Dr. Tana Coast, patient will need to stay until urine culture has resulted in case antibiotic therapy needs to be changed.  Length of Stay: 1  Current Medications: Scheduled Meds:  . apixaban  2.5 mg Oral BID  . senna  1 tablet Oral Daily    Continuous Infusions: . cefTRIAXone (ROCEPHIN)  IV Stopped (01/26/20 1900)    PRN Meds: HYDROcodone-acetaminophen, morphine injection  Physical Exam Constitutional:      General: She is not in acute distress.    Comments:  Frail appearing  Pulmonary:     Effort: Pulmonary effort is normal.  Neurological:     Mental Status: She is alert.  Psychiatric:        Cognition and Memory: Cognition is impaired.             Vital Signs: BP 119/86   Pulse (!) 105   Temp 98.1 F (36.7 C) (Oral)   Resp 19   Ht _0  (1.575 m)   Wt 79.4 kg   SpO2 95%   BMI 32.01 kg/m  SpO2: SpO2: 95 % O2 Device: O2 Device: Room Air O2 Flow Rate:    LBM:   Baseline Weight: Weight: 79.4 kg Most recent weight: Weight: 79.4 kg       Palliative Assessment/Data: PPS 30%      Palliative Care Assessment & Plan   Assessment: - acetabular fracture, secondary to fall - dementia - history of breast cancer - persistent atrial fibrillation - UTI   Recommendations/Plan: Patient and son clearly do not want any type of aggressive care I completed a MOST form electronically with son [name] Ortho has recommended non-surgical management  [name] does not think his mother will benefit from or participate in PT Hospice referral, I have spoken with TOC and hospice liaison Urine culture pending  Scope of treatment (per MOST form completed 01/27/20):   Cardiopulmonary Resuscitation: Do Not Attempt Resuscitation (DNR/No CPR)  Medical Interventions: Comfort Measures: Keep, clean, warm and dry. Use medication by any route, positioning, wound care and other measures to relieve pain and suffering. Use  oxygen, suction and manual treatment of airway obstruction as needed for comfort. Do not transfer to hospital unless comfort needs cannot be met in current location.   Antibiotics: - Antibiotic if indicated  IV Fluids: - No IV fluids  Feeding Tube: - No feeding tube     Code Status: DNR/DNI (present on admission)  Prognosis:  < 6 months  Discharge Planning: To Be Determined   Thank you for allowing the Palliative Medicine Team to assist in the care of this patient.   Total Time 35 minutes Prolonged Time Billed  no       Greater than 50%  of this time was spent counseling and coordinating care related to the above assessment and plan.  Lavena Bullion, NP  Please contact Palliative Medicine Team phone at [phone] for questions and concerns.

## 2020-01-27 NOTE — ED Notes (Signed)
Pt sitting up in bed eating lunch.  Requesting pain medicine.  Yelling at staff stating that the reason she is out in the hall had doesn't have a bed is because the ED staff are too slow.

## 2020-01-27 NOTE — Discharge Planning (Signed)
Pt from [facility] (long term skilled).

## 2020-01-27 NOTE — Progress Notes (Addendum)
[facility])  Referral received for hospice at [facility].   Not clear if she was on SNF days at [facility] or not. She was originally send to rehab but was not participating.   Spoke with son, he confirms interest.  Plan to d/c likely tomorrow back to facility.  May need bed ordered with rails, son is not sure what type of bed she has at facility.  Once ready to d/c, please arrange for comfort scripts so there is no lapse in her comfort prior to hospice services beginning.  Venia Carbon RN, BSN, [facility] Liaison

## 2020-01-27 NOTE — ED Notes (Signed)
Pt able to answer all questions appropriately.  However, she keeps crying out that she's scared she's going to fall.  Pt assured that we will not let her fall.

## 2020-01-27 NOTE — ED Notes (Signed)
Pt asking for orange juice. Pt given juice and applesauce, per Hassan Rowan - RN. Pt assisted with eating applesauce.

## 2020-01-28 DIAGNOSIS — Z7189 Other specified counseling: Secondary | ICD-10-CM | POA: Insufficient documentation

## 2020-01-28 DIAGNOSIS — R627 Adult failure to thrive: Secondary | ICD-10-CM | POA: Insufficient documentation

## 2020-01-28 LAB — URINE CULTURE: Culture: >=100000 — AB

## 2020-01-28 MED ORDER — TAMOXIFEN CITRATE 10 MG PO TABS
10.0000 mg | ORAL_TABLET | Freq: Every day | ORAL | Status: DC
  Administered 2020-01-28 – 2020-02-01 (×5): 10 mg via ORAL
  Filled 2020-01-28 (×7): qty 1

## 2020-01-28 MED ORDER — NYSTATIN-TRIAMCINOLONE 100000-0.1 UNIT/GM-% EX CREA
1.0000 "application " | TOPICAL_CREAM | Freq: Three times a day (TID) | CUTANEOUS | Status: DC
  Administered 2020-01-28 – 2020-02-01 (×13): 1 via TOPICAL
  Filled 2020-01-28 (×2): qty 30

## 2020-01-28 MED ORDER — AMIODARONE HCL 200 MG PO TABS
100.0000 mg | ORAL_TABLET | Freq: Every day | ORAL | Status: DC
  Administered 2020-01-28 – 2020-02-01 (×5): 100 mg via ORAL
  Filled 2020-01-28 (×5): qty 1

## 2020-01-28 MED ORDER — BACLOFEN 10 MG PO TABS
10.0000 mg | ORAL_TABLET | Freq: Three times a day (TID) | ORAL | Status: DC
  Administered 2020-01-28 – 2020-02-01 (×13): 10 mg via ORAL
  Filled 2020-01-28 (×16): qty 1

## 2020-01-28 MED ORDER — APIXABAN 2.5 MG PO TABS: 2.5000 mg | ORAL_TABLET | Freq: Two times a day (BID) | ORAL | Status: DC

## 2020-01-28 MED ORDER — GABAPENTIN 100 MG PO CAPS
200.0000 mg | ORAL_CAPSULE | Freq: Two times a day (BID) | ORAL | Status: DC
  Administered 2020-01-28 – 2020-02-01 (×9): 200 mg via ORAL
  Filled 2020-01-28 (×9): qty 2

## 2020-01-28 MED ORDER — VALACYCLOVIR HCL 500 MG PO TABS
1000.0000 mg | ORAL_TABLET | Freq: Two times a day (BID) | ORAL | Status: DC
  Administered 2020-01-28 – 2020-02-01 (×9): 1000 mg via ORAL
  Filled 2020-01-28 (×12): qty 2

## 2020-01-28 MED ORDER — ACETAMINOPHEN 500 MG PO TABS
500.0000 mg | ORAL_TABLET | ORAL | Status: DC | PRN
  Administered 2020-01-28: 500 mg via ORAL
  Filled 2020-01-28: qty 1

## 2020-01-28 MED ORDER — NYSTATIN-TRIAMCINOLONE 100000-0.1 UNIT/GM-% EX CREA
1.0000 "application " | TOPICAL_CREAM | Freq: Three times a day (TID) | CUTANEOUS | Status: DC
  Filled 2020-01-28: qty 15

## 2020-01-28 MED ORDER — LEVOTHYROXINE SODIUM 50 MCG PO TABS
50.0000 ug | ORAL_TABLET | Freq: Every day | ORAL | Status: DC
  Administered 2020-01-29 – 2020-02-01 (×4): 50 ug via ORAL
  Filled 2020-01-28 (×4): qty 1

## 2020-01-28 MED ORDER — BENAZEPRIL HCL 5 MG PO TABS
5.0000 mg | ORAL_TABLET | Freq: Every day | ORAL | Status: DC
  Administered 2020-01-28 – 2020-02-01 (×5): 5 mg via ORAL
  Filled 2020-01-28 (×7): qty 1

## 2020-01-28 MED ORDER — NYSTATIN-TRIAMCINOLONE 100000-0.1 UNIT/GM-% EX CREA
TOPICAL_CREAM | Freq: Three times a day (TID) | CUTANEOUS | Status: DC
  Filled 2020-01-28: qty 15

## 2020-01-28 MED ORDER — GERHARDT'S BUTT CREAM
TOPICAL_CREAM | Freq: Four times a day (QID) | CUTANEOUS | Status: DC
  Administered 2020-01-29 – 2020-02-01 (×3): 1 via TOPICAL
  Filled 2020-01-28 (×2): qty 1

## 2020-01-28 NOTE — Plan of Care (Signed)

## 2020-01-28 NOTE — Consult Note (Signed)
WOC Nurse Consult Note: Patient receiving care in [facility] ED17.  Primary RN, Chelsea Felts, assisted with turning for buttock and back assessment, as did a NT. Reason for Consult: When I called [phone] I learned from [place] the areas of concern. Wound type: Patient is incontinent of urine and stool. Her buttocks, posterior upper thighs, medial and anterior upper thighs, and bilateral groin areas are severely impacted by MASD-IAD.   On the right lower buttock there is a cluster of pustules in an erythematous, indurated area that appear consistent with shingles.  Treatment of this condition exceeds the scope of practice of the [facility] nurse and I recommended the provider be made aware and to assess the site for a diagnosis of the area. On the right upper back, above the scapula, there is a large patch of red, moist tissue that resembles MASD, but in this case I cannot determine the cause of condition.  Pressure Injury POA: Yes/No/NA Measurement: Wound bed: Drainage (amount, consistency, odor)  Periwound: Dressing procedure/placement/frequency: QID application of Gerhardt's butt cream to all areas except the pustule cluster. Continue to use the Purewick urinary collection system, which was in place today. Monitor the wound area(s) for worsening of condition such as: Signs/symptoms of infection,  Increase in size,  Development of or worsening of odor, Development of pain, or increased pain at the affected locations.  Notify the medical team if any of these develop.  Thank you for the consult.  Discussed plan of care with the patient and bedside nurse.  [facility] nurse will not follow at this time.  Please re-consult the [facility] team if needed.  Val Riles, RN, MSN, CWOCN, CNS-BC, pager [phone]

## 2020-01-28 NOTE — Progress Notes (Addendum)
10:20am: CSW was notified by Vikki Ports, RN that per EDP the patient will require a medical admission prior to discharging to the facility.  9:45am: CSW received call from [place] who states the patient's cousin will be completing paperwork at 11:30am today. The patient will be able to transfer to the facility after that paperwork is completed.  8:30am: CSW spoke with Narda Rutherford at [facility] regarding this patient's return - Narda Rutherford to speak with the patient's son regarding the need for new paperwork to be completed. Janie to return call to CSW once details have been finalized.  Madilyn Fireman, MSW, LCSW-A Transitions of Care  Clinical Social Worker  [facility] Emergency Departments  Medical ICU [phone]

## 2020-01-28 NOTE — Plan of Care (Signed)
°  Problem: Education: Goal: Knowledge of General Education information will improve Description: Including pain rating scale, medication(s)/side effects and non-pharmacologic comfort measures 01/28/2020 2208 by Guinevere Scarlet, RN Outcome: Progressing 01/28/2020 2135 by Guinevere Scarlet, RN Outcome: Progressing   Problem: Health Behavior/Discharge Planning: Goal: Ability to manage health-related needs will improve 01/28/2020 2208 by Guinevere Scarlet, RN Outcome: Progressing 01/28/2020 2135 by Guinevere Scarlet, RN Outcome: Progressing   Problem: Clinical Measurements: Goal: Ability to maintain clinical measurements within normal limits will improve 01/28/2020 2208 by Guinevere Scarlet, RN Outcome: Progressing 01/28/2020 2135 by Guinevere Scarlet, RN Outcome: Progressing

## 2020-01-28 NOTE — Progress Notes (Signed)
[facility]                                                                              Patient Demographics  [name], is a 86 y.o. female, DOB - 1931-04-26, ZOX:[id number]  Admit date - 01/26/2020   Admitting Physician Karen Morton, MD  Outpatient Primary MD for the patient is [name]  Outpatient specialists:   LOS - 2  days   Medical records reviewed and are as summarized below:    Chief Complaint  Patient presents with  . Level 2 Fall       Brief summary   Patient is 86 year old female with history of hypertension, paroxysmal A. fib on Eliquis, breast cancer, dementia presented from [facility] after mechanical fall.  Patient had no recollection of the events that led to her falling on the morning of admission in the bathroom.  She sustained a laceration to the right side of the scalp.  Patient complained of left leg pain when moving it.  CT of the left hip showed nonunited nondisplaced fracture of the anterior superior testicle extending to the anterior column, Periprosthetic loosening Orthopedics was consulted, seen by [name], recommended nonsurgical management.  Patient son, [name], requested palliative    Assessment & Plan    Principal Problem: Left periprostatic acetabular fracture ([place]) -  secondary to mechanical fall in the bathroom -Per orthopedics, [name], nonsurgical management is the best course of action given comorbidities -Continue pain control, bowel regimen, hospice to follow at the facility   Active Problems: SIRS with E. coli acute lower UTI -Patient presented with lactic acidosis, leukocytosis, tachycardia -Urine culture showed more than 100,000 colonies of E. coli, on IV Rocephin  Shingles: 2  vesicular lesions on the right buttock, complaining pruritus but no pain - will monitor the lesions closely, started on Valtrex for 7 days  Rash on the upper back, upper thighs and bilateral groin  area -Wound care consulted, appreciate recommendations -Nystatin ointment ordered  History of breast CA -BRCA positive, continue tamoxifen  Hypothyroidism Continue Synthroid  Dementia -Appreciate palliative medicine recommendations   Obesity Estimated body mass index is 32.01 kg/m as calculated from the following:   Height as of this encounter: 5' 2"  (1.575 m).   Weight as of this encounter: 79.4 kg.  Code Status: DNR DVT Prophylaxis:  SCD's Family Communication: Discussed all imaging results, lab results, explained to the patient's son, [name]   on 9/15   Disposition Plan:     Status is: Inpatient  Remains inpatient appropriate because:Inpatient level of care appropriate due to severity of illness   Dispo: The patient is from: SNF              Anticipated d/c is to: SNF              Anticipated d/c date is: 3 days              Patient currently is not medically stable to d/c.  Patient currently in the semiprivate room at the facility, will need either private room or shingle lesions to crust  Time Spent in minutes   20mns    Procedures:  None   Consultants:   Ortho  Palliative   Antimicrobials:   Anti-infectives (From admission, onward)   Start     Dose/Rate Route Frequency Ordered Stop   01/28/20 1030  valACYclovir (VALTREX) tablet 1,000 mg        1,000 mg Oral 2 times daily 01/28/20 1019 02/04/20 0959   01/26/20 1800  cefTRIAXone (ROCEPHIN) 1 g in sodium chloride 0.9 % 100 mL IVPB        1 g 200 mL/hr over 30 Minutes Intravenous Every 24 hours 01/26/20 1738           Medications  Scheduled Meds: . amiodarone  100 mg Oral Daily  . apixaban  2.5 mg Oral BID  . baclofen  10 mg Oral TID  . benazepril  5 mg Oral Daily  . gabapentin  200 mg Oral BID  . Gerhardt's butt cream   Topical QID  . [START ON 01/29/2020] levothyroxine  50 mcg Oral QAC breakfast  . nystatin-triamcinolone  1 application Topical TID  . senna  1 tablet Oral Daily  .  tamoxifen  10 mg Oral Daily  . valACYclovir  1,000 mg Oral BID   Continuous Infusions: . cefTRIAXone (ROCEPHIN)  IV Stopped (01/27/20 1940)   PRN Meds:.acetaminophen, HYDROcodone-acetaminophen, morphine injection      Subjective:   [name] seen and examined today.  No acute complaints, rash on the back, thigh in the groin area.  No pain, fevers or chills.  Does not like her breakfast.  Objective:   Vitals:   01/27/20 0445 01/27/20 0600 01/27/20 1853 01/28/20 0809  BP: 119/64 119/86 119/86 (!) 134/93  Pulse: (!) 102 (!) 105 74 90  Resp: (!) 22 19 18 17   Temp:   98.4 F (36.9 C) 98.2 F (36.8 C)  TempSrc:   Oral Oral  SpO2: 100% 95% 100% 99%  Weight:      Height:        Intake/Output Summary (Last 24 hours) at 01/28/2020 1621 Last data filed at 01/27/2020 1940 Gross per 24 hour  Intake 1100 ml  Output --  Net 1100 ml     Wt Readings from Last 3 Encounters:  01/26/20 79.4 kg   Physical Exam  General: Alert and oriented x oriented to self and place, NAD, dementia  Cardiovascular: S1 S2 clear, RRR. No pedal edema b/l  Respiratory: CTAB, no wheezing, rales or rhonchi  Gastrointestinal: Soft, nontender, nondistended, NBS  Ext: no pedal edema bilaterally  Neuro: no new deficits  Musculoskeletal: No cyanosis, clubbing  Skin: 2 small vesicular lesions on the right buttock, erythematous rash on the back,  in the groin area  Psych: dementia      Data Reviewed:  I have personally reviewed following labs and imaging studies  Micro Results Recent Results (from the past 240 hour(s))  SARS Coronavirus 2 by RT PCR (hospital order, [facility]) Nasopharyngeal Nasopharyngeal Swab     Status: None   Collection Time: 01/26/20  4:06 PM   Specimen: Nasopharyngeal Swab  Result Value Ref Range Status   SARS Coronavirus 2 NEGATIVE NEGATIVE Final    Comment: (NOTE) SARS-CoV-2 target nucleic acids are NOT DETECTED.  The SARS-CoV-2  RNA is generally detectable in upper and lower respiratory specimens during the acute phase of infection. The lowest concentration of SARS-CoV-2 viral copies this assay can detect is 250 copies / mL. A negative result  does not preclude SARS-CoV-2 infection and should not be used as the sole basis for treatment or other patient management decisions.  A negative result may occur with improper specimen collection / handling, submission of specimen other than nasopharyngeal swab, presence of viral mutation(s) within the areas targeted by this assay, and inadequate number of viral copies (<250 copies / mL). A negative result must be combined with clinical observations, patient history, and epidemiological information.  Fact Sheet for Patients:   StrictlyIdeas.no  Fact Sheet for Healthcare Providers: BankingDealers.co.za  This test is not yet approved or  cleared by the [place] FDA and has been authorized for detection and/or diagnosis of SARS-CoV-2 by FDA under an Emergency Use Authorization (EUA).  This EUA will remain in effect (meaning this test can be used) for the duration of the COVID-19 declaration under Section 564(b)(1) of the Act, 21 U.S.C. section 360bbb-3(b)(1), unless the authorization is terminated or revoked sooner.  Performed at [facility], [place] [address] [place], [place] [address]   Culture, blood (routine x 2)     Status: None (Preliminary result)   Collection Time: 01/26/20  6:16 PM   Specimen: BLOOD  Result Value Ref Range Status   Specimen Description BLOOD LEFT ANTECUBITAL  Final   Special Requests   Final    BOTTLES DRAWN AEROBIC AND ANAEROBIC Blood Culture results may not be optimal due to an inadequate volume of blood received in culture bottles   Culture   Final    NO GROWTH 2 DAYS Performed at [facility], [place] [address] [place], [place] [address]    Report Status PENDING  Incomplete   Culture, Urine     Status: Abnormal   Collection Time: 01/26/20  6:20 PM   Specimen: Urine, Clean Catch  Result Value Ref Range Status   Specimen Description URINE, CLEAN CATCH  Final   Special Requests   Final    NONE Performed at [facility], [place] [address] [place], [place] [address]    Culture >=100,000 COLONIES/mL ESCHERICHIA COLI (A)  Final   Report Status 01/28/2020 FINAL  Final   Organism ID, Bacteria ESCHERICHIA COLI (A)  Final      Susceptibility   Escherichia coli - MIC*    AMPICILLIN >=32 RESISTANT Resistant     CEFAZOLIN <=4 SENSITIVE Sensitive     CEFTRIAXONE <=0.25 SENSITIVE Sensitive     CIPROFLOXACIN <=0.25 SENSITIVE Sensitive     GENTAMICIN <=1 SENSITIVE Sensitive     IMIPENEM <=0.25 SENSITIVE Sensitive     NITROFURANTOIN <=16 SENSITIVE Sensitive     TRIMETH/SULFA <=20 SENSITIVE Sensitive     AMPICILLIN/SULBACTAM 16 INTERMEDIATE Intermediate     PIP/TAZO <=4 SENSITIVE Sensitive     * >=100,000 COLONIES/mL ESCHERICHIA COLI  Culture, blood (routine x 2)     Status: None (Preliminary result)   Collection Time: 01/27/20  1:14 AM   Specimen: BLOOD RIGHT HAND  Result Value Ref Range Status   Specimen Description BLOOD RIGHT HAND  Final   Special Requests   Final    BOTTLES DRAWN AEROBIC AND ANAEROBIC Blood Culture adequate volume   Culture   Final    NO GROWTH 1 DAY Performed at [facility], [address] [place], [place] [address]    Report Status PENDING  Incomplete    Radiology Reports CT HEAD WO CONTRAST  Result Date: 01/26/2020 CLINICAL DATA:  Fall on blood thinner EXAM: CT HEAD WITHOUT CONTRAST TECHNIQUE: Contiguous axial  images were obtained from the base of the skull through the vertex without intravenous contrast. COMPARISON:  None. FINDINGS: Brain: No evidence of acute territorial infarction, hemorrhage, hydrocephalus,extra-axial collection or mass lesion/mass effect. There is dilatation the ventricles and sulci consistent with  age-related atrophy. Low-attenuation changes in the deep white matter consistent with small vessel ischemia. Vascular: No hyperdense vessel or unexpected calcification. Skull: The skull is intact. No fracture or focal lesion identified. Sinuses/Orbits: The visualized paranasal sinuses and mastoid air cells are clear. The orbits and globes intact. Other: None Cervical spine: Alignment: Physiologic Skull base and vertebrae: Visualized skull base is intact. No atlanto-occipital dissociation. The vertebral body heights are well maintained. No fracture or pathologic osseous lesion seen. Soft tissues and spinal canal: The visualized paraspinal soft tissues are unremarkable. No prevertebral soft tissue swelling is seen. The spinal canal is grossly unremarkable, no large epidural collection or significant canal narrowing. Disc levels: Multilevel cervical spine spondylosis is seen with anterior osteophytes disc osteophyte complex and uncovertebral osteophytes most notable at C5-C6 with moderate neural foraminal narrowing and mild central canal stenosis. Upper chest: The lung apices are clear. Thoracic inlet is within normal limits. Other: None IMPRESSION: No acute intracranial abnormality. Findings consistent with age related atrophy and chronic small vessel ischemia No acute fracture or malalignment of the spine. Electronically Signed   By: Prudencio Pair M.D.   On: 01/26/2020 15:54   CT CERVICAL SPINE WO CONTRAST  Result Date: 01/26/2020 CLINICAL DATA:  Fall on blood thinner EXAM: CT HEAD WITHOUT CONTRAST TECHNIQUE: Contiguous axial images were obtained from the base of the skull through the vertex without intravenous contrast. COMPARISON:  None. FINDINGS: Brain: No evidence of acute territorial infarction, hemorrhage, hydrocephalus,extra-axial collection or mass lesion/mass effect. There is dilatation the ventricles and sulci consistent with age-related atrophy. Low-attenuation changes in the deep white matter  consistent with small vessel ischemia. Vascular: No hyperdense vessel or unexpected calcification. Skull: The skull is intact. No fracture or focal lesion identified. Sinuses/Orbits: The visualized paranasal sinuses and mastoid air cells are clear. The orbits and globes intact. Other: None Cervical spine: Alignment: Physiologic Skull base and vertebrae: Visualized skull base is intact. No atlanto-occipital dissociation. The vertebral body heights are well maintained. No fracture or pathologic osseous lesion seen. Soft tissues and spinal canal: The visualized paraspinal soft tissues are unremarkable. No prevertebral soft tissue swelling is seen. The spinal canal is grossly unremarkable, no large epidural collection or significant canal narrowing. Disc levels: Multilevel cervical spine spondylosis is seen with anterior osteophytes disc osteophyte complex and uncovertebral osteophytes most notable at C5-C6 with moderate neural foraminal narrowing and mild central canal stenosis. Upper chest: The lung apices are clear. Thoracic inlet is within normal limits. Other: None IMPRESSION: No acute intracranial abnormality. Findings consistent with age related atrophy and chronic small vessel ischemia No acute fracture or malalignment of the spine. Electronically Signed   By: Prudencio Pair M.D.   On: 01/26/2020 15:54   DG Pelvis Portable  Result Date: 01/26/2020 CLINICAL DATA:  Fall. EXAM: PORTABLE PELVIS 1-2 VIEWS COMPARISON:  November 08, 2009.  December 15, 2018. FINDINGS: Status post left hip arthroplasty. There is interval development of mildly displaced periprosthetic left acetabular fracture. Right hip is unremarkable. Degenerative changes seen involving both sacroiliac joints. IMPRESSION: Interval development of mildly displaced periprosthetic left acetabular fracture. Electronically Signed   By: Marijo Conception M.D.   On: 01/26/2020 14:50   CT Hip Left Wo Contrast  Result Date: 01/26/2020 CLINICAL DATA:  Pain after  fall EXAM: CT OF THE LEFT HIP WITHOUT CONTRAST TECHNIQUE: Multidetector CT imaging of the left hip was performed according to the standard protocol. Multiplanar CT image reconstructions were also generated. COMPARISON:  None. FINDINGS: Bones/Joint/Cartilage The patient is status post left total hip arthroplasty. There is a nonunited fracture seen at the anterior superior acetabulum extending through the anterior column. There is a tiny fracture fragment seen along the lateral acetabular rim. The femoral head component is still well seated within the acetabular component. There appears to be a area periprosthetic lucency seen around the the inferior acetabular cup measuring up to 1 cm, slightly more progressed since the prior exam. Ligaments Suboptimally assessed by CT. Muscles and Tendons The muscles surrounding the hip appear to be intact. There is mild fatty atrophy of the muscles. There is limited visualization of the tendons, the hamstrings and iliopsoas tendons appear to be intact. Soft tissues Mild subcutaneous edema seen along the lateral aspect of the hip. The bladder is fluid-filled and distended. Scattered vascular calcifications are noted. IMPRESSION: 1. Left total hip arthroplasty with a nonunited nondisplaced fracture of the anterior superior acetabulum extending through the anterior column. 2. Slight interval increase in the periprosthetic lucency in the inferior portion of the acetabular cup, likely consistent with periprosthetic loosening. Electronically Signed   By: Prudencio Pair M.D.   On: 01/26/2020 18:16   DG Chest Port 1 View  Result Date: 01/26/2020 CLINICAL DATA:  Fall. EXAM: PORTABLE CHEST 1 VIEW COMPARISON:  December 04, 2019.  Sep 18, 2019. FINDINGS: The heart size and mediastinal contours are within normal limits. Both lungs are clear. No pneumothorax or pleural effusion is noted. Stable chronic anterior dislocation involving proximal right humerus is noted. IMPRESSION: No active disease.  Electronically Signed   By: Marijo Conception M.D.   On: 01/26/2020 14:47   DG Shoulder Right Port  Result Date: 01/26/2020 CLINICAL DATA:  Fall. EXAM: PORTABLE RIGHT SHOULDER COMPARISON:  Sep 18, 2019. FINDINGS: There is stable anterior dislocation or subluxation of the right proximal humeral head relative to the glenoid labrum consistent with chronic dislocation. Deformity of right humeral head is noted consistent with chronic degenerative change. No acute fracture is noted. IMPRESSION: Stable chronic anterior dislocation or subluxation of right proximal humeral head relative to the glenoid labrum. Chronic degenerative change of right humeral head is noted. Electronically Signed   By: Marijo Conception M.D.   On: 01/26/2020 14:51   DG Knee Left Port  Result Date: 01/26/2020 CLINICAL DATA:  Status post fall. EXAM: PORTABLE LEFT KNEE - 1-2 VIEW COMPARISON:  None. FINDINGS: Status post left total knee arthroplasty. The femoral and tibial components appear to be well situated. No fracture or dislocation is noted. No soft tissue abnormality is noted. IMPRESSION: Status post left total knee arthroplasty. No acute abnormality seen in the left knee. Electronically Signed   By: Marijo Conception M.D.   On: 01/26/2020 14:45   DG Knee Right Port  Result Date: 01/26/2020 CLINICAL DATA:  Fall. EXAM: PORTABLE RIGHT KNEE - 1-2 VIEW COMPARISON:  None. FINDINGS: Status post right total knee arthroplasty. The femoral and tibial components appear to be well situated. No fracture or dislocation is noted. IMPRESSION: Status post right total knee arthroplasty. No acute abnormality seen in the right knee. Electronically Signed   By: Marijo Conception M.D.   On: 01/26/2020 14:48    Lab Data:  CBC: Recent Labs  Lab 01/26/20 1404 01/27/20 0114  WBC  11.1* 10.7*  HGB 13.6 12.7  HCT 44.6 41.6  MCV 92.5 91.6  PLT 273 287   Basic Metabolic Panel: Recent Labs  Lab 01/26/20 1404 01/27/20 0114  NA 140 141  K 4.2 3.7  CL  103 106  CO2 25 24  GLUCOSE 110* 108*  BUN 20 19  CREATININE 0.59 0.58  CALCIUM 9.4 8.9   GFR: Estimated Creatinine Clearance: 48.3 mL/min (by C-G formula based on SCr of 0.58 mg/dL). Liver Function Tests: Recent Labs  Lab 01/26/20 1404  AST 13*  ALT 11  ALKPHOS 49  BILITOT 0.5  PROT 6.8  ALBUMIN 3.2*   No results for input(s): LIPASE, AMYLASE in the last 168 hours. No results for input(s): AMMONIA in the last 168 hours. Coagulation Profile: Recent Labs  Lab 01/26/20 1404  INR 1.3*   Cardiac Enzymes: No results for input(s): CKTOTAL, CKMB, CKMBINDEX, TROPONINI in the last 168 hours. BNP (last 3 results) No results for input(s): PROBNP in the last 8760 hours. HbA1C: No results for input(s): HGBA1C in the last 72 hours. CBG: No results for input(s): GLUCAP in the last 168 hours. Lipid Profile: No results for input(s): CHOL, HDL, LDLCALC, TRIG, CHOLHDL, LDLDIRECT in the last 72 hours. Thyroid Function Tests: No results for input(s): TSH, T4TOTAL, FREET4, T3FREE, THYROIDAB in the last 72 hours. Anemia Panel: No results for input(s): VITAMINB12, FOLATE, FERRITIN, TIBC, IRON, RETICCTPCT in the last 72 hours. Urine analysis:    Component Value Date/Time   COLORURINE YELLOW 01/26/2020 1606   APPEARANCEUR CLOUDY (A) 01/26/2020 1606   LABSPEC 1.011 01/26/2020 1606   PHURINE 5.0 01/26/2020 1606   GLUCOSEU NEGATIVE 01/26/2020 1606   HGBUR SMALL (A) 01/26/2020 1606   BILIRUBINUR NEGATIVE 01/26/2020 1606   KETONESUR NEGATIVE 01/26/2020 1606   PROTEINUR NEGATIVE 01/26/2020 1606   NITRITE NEGATIVE 01/26/2020 1606   LEUKOCYTESUR LARGE (A) 01/26/2020 1606     Ebb Carelock M.D. [facility] 01/28/2020, 4:21 PM   Call night coverage person covering after 7pm

## 2020-01-28 NOTE — ED Notes (Signed)
Pt bilateral groin and buttocks excoriated. Brief changed, purwick in place, moisture barrier cream applied. Mepilex applied to sacrum.

## 2020-01-28 NOTE — Discharge Planning (Signed)
RNCM confirmed with Domenic Moras, RN that [facility] is following pt for disposition needs.

## 2020-01-28 NOTE — Progress Notes (Signed)
Subjective:    Patient laying in bed. Asking to please take her back out to the hallway where she was before. Denies hip pain, states "they fixed it yesterday". No other complaints this morning.   Objective:  PE: VITALS:   Vitals:   01/27/20 0445 01/27/20 0600 01/27/20 1853 01/28/20 0809  BP: 119/64 119/86 119/86 (!) 134/93  Pulse: (!) 102 (!) 105 74 90  Resp: (!) 22 19 18 17   Temp:   98.4 F (36.9 C) 98.2 F (36.8 C)  TempSrc:   Oral Oral  SpO2: 100% 95% 100% 99%  Weight:      Height:       General: Laying in bed comfortably, agitated with exam, wanting to be moved to hallway MSK: LLE - No TTP to groin or lateral thigh. EHL and FHL intact. Able to flex and extend all toes of left foot. +DP pulse. Sensation intact throughout foot    LABS  No results found for this or any previous visit (from the past 24 hour(s)).  CT HEAD WO CONTRAST  Result Date: 01/26/2020 CLINICAL DATA:  Fall on blood thinner EXAM: CT HEAD WITHOUT CONTRAST TECHNIQUE: Contiguous axial images were obtained from the base of the skull through the vertex without intravenous contrast. COMPARISON:  None. FINDINGS: Brain: No evidence of acute territorial infarction, hemorrhage, hydrocephalus,extra-axial collection or mass lesion/mass effect. There is dilatation the ventricles and sulci consistent with age-related atrophy. Low-attenuation changes in the deep white matter consistent with small vessel ischemia. Vascular: No hyperdense vessel or unexpected calcification. Skull: The skull is intact. No fracture or focal lesion identified. Sinuses/Orbits: The visualized paranasal sinuses and mastoid air cells are clear. The orbits and globes intact. Other: None Cervical spine: Alignment: Physiologic Skull base and vertebrae: Visualized skull base is intact. No atlanto-occipital dissociation. The vertebral body heights are well maintained. No fracture or pathologic osseous lesion seen. Soft tissues and spinal canal: The  visualized paraspinal soft tissues are unremarkable. No prevertebral soft tissue swelling is seen. The spinal canal is grossly unremarkable, no large epidural collection or significant canal narrowing. Disc levels: Multilevel cervical spine spondylosis is seen with anterior osteophytes disc osteophyte complex and uncovertebral osteophytes most notable at C5-C6 with moderate neural foraminal narrowing and mild central canal stenosis. Upper chest: The lung apices are clear. Thoracic inlet is within normal limits. Other: None IMPRESSION: No acute intracranial abnormality. Findings consistent with age related atrophy and chronic small vessel ischemia No acute fracture or malalignment of the spine. Electronically Signed   By: Prudencio Pair M.D.   On: 01/26/2020 15:54   CT CERVICAL SPINE WO CONTRAST  Result Date: 01/26/2020 CLINICAL DATA:  Fall on blood thinner EXAM: CT HEAD WITHOUT CONTRAST TECHNIQUE: Contiguous axial images were obtained from the base of the skull through the vertex without intravenous contrast. COMPARISON:  None. FINDINGS: Brain: No evidence of acute territorial infarction, hemorrhage, hydrocephalus,extra-axial collection or mass lesion/mass effect. There is dilatation the ventricles and sulci consistent with age-related atrophy. Low-attenuation changes in the deep white matter consistent with small vessel ischemia. Vascular: No hyperdense vessel or unexpected calcification. Skull: The skull is intact. No fracture or focal lesion identified. Sinuses/Orbits: The visualized paranasal sinuses and mastoid air cells are clear. The orbits and globes intact. Other: None Cervical spine: Alignment: Physiologic Skull base and vertebrae: Visualized skull base is intact. No atlanto-occipital dissociation. The vertebral body heights are well maintained. No fracture or pathologic osseous lesion seen. Soft tissues and spinal canal: The visualized paraspinal  soft tissues are unremarkable. No prevertebral soft  tissue swelling is seen. The spinal canal is grossly unremarkable, no large epidural collection or significant canal narrowing. Disc levels: Multilevel cervical spine spondylosis is seen with anterior osteophytes disc osteophyte complex and uncovertebral osteophytes most notable at C5-C6 with moderate neural foraminal narrowing and mild central canal stenosis. Upper chest: The lung apices are clear. Thoracic inlet is within normal limits. Other: None IMPRESSION: No acute intracranial abnormality. Findings consistent with age related atrophy and chronic small vessel ischemia No acute fracture or malalignment of the spine. Electronically Signed   By: Prudencio Pair M.D.   On: 01/26/2020 15:54   DG Pelvis Portable  Result Date: 01/26/2020 CLINICAL DATA:  Fall. EXAM: PORTABLE PELVIS 1-2 VIEWS COMPARISON:  November 08, 2009.  December 15, 2018. FINDINGS: Status post left hip arthroplasty. There is interval development of mildly displaced periprosthetic left acetabular fracture. Right hip is unremarkable. Degenerative changes seen involving both sacroiliac joints. IMPRESSION: Interval development of mildly displaced periprosthetic left acetabular fracture. Electronically Signed   By: Marijo Conception M.D.   On: 01/26/2020 14:50   CT Hip Left Wo Contrast  Result Date: 01/26/2020 CLINICAL DATA:  Pain after fall EXAM: CT OF THE LEFT HIP WITHOUT CONTRAST TECHNIQUE: Multidetector CT imaging of the left hip was performed according to the standard protocol. Multiplanar CT image reconstructions were also generated. COMPARISON:  None. FINDINGS: Bones/Joint/Cartilage The patient is status post left total hip arthroplasty. There is a nonunited fracture seen at the anterior superior acetabulum extending through the anterior column. There is a tiny fracture fragment seen along the lateral acetabular rim. The femoral head component is still well seated within the acetabular component. There appears to be a area periprosthetic lucency  seen around the the inferior acetabular cup measuring up to 1 cm, slightly more progressed since the prior exam. Ligaments Suboptimally assessed by CT. Muscles and Tendons The muscles surrounding the hip appear to be intact. There is mild fatty atrophy of the muscles. There is limited visualization of the tendons, the hamstrings and iliopsoas tendons appear to be intact. Soft tissues Mild subcutaneous edema seen along the lateral aspect of the hip. The bladder is fluid-filled and distended. Scattered vascular calcifications are noted. IMPRESSION: 1. Left total hip arthroplasty with a nonunited nondisplaced fracture of the anterior superior acetabulum extending through the anterior column. 2. Slight interval increase in the periprosthetic lucency in the inferior portion of the acetabular cup, likely consistent with periprosthetic loosening. Electronically Signed   By: Prudencio Pair M.D.   On: 01/26/2020 18:16   DG Chest Port 1 View  Result Date: 01/26/2020 CLINICAL DATA:  Fall. EXAM: PORTABLE CHEST 1 VIEW COMPARISON:  December 04, 2019.  Sep 18, 2019. FINDINGS: The heart size and mediastinal contours are within normal limits. Both lungs are clear. No pneumothorax or pleural effusion is noted. Stable chronic anterior dislocation involving proximal right humerus is noted. IMPRESSION: No active disease. Electronically Signed   By: Marijo Conception M.D.   On: 01/26/2020 14:47   DG Shoulder Right Port  Result Date: 01/26/2020 CLINICAL DATA:  Fall. EXAM: PORTABLE RIGHT SHOULDER COMPARISON:  Sep 18, 2019. FINDINGS: There is stable anterior dislocation or subluxation of the right proximal humeral head relative to the glenoid labrum consistent with chronic dislocation. Deformity of right humeral head is noted consistent with chronic degenerative change. No acute fracture is noted. IMPRESSION: Stable chronic anterior dislocation or subluxation of right proximal humeral head relative to the glenoid labrum.  Chronic degenerative  change of right humeral head is noted. Electronically Signed   By: Marijo Conception M.D.   On: 01/26/2020 14:51   DG Knee Left Port  Result Date: 01/26/2020 CLINICAL DATA:  Status post fall. EXAM: PORTABLE LEFT KNEE - 1-2 VIEW COMPARISON:  None. FINDINGS: Status post left total knee arthroplasty. The femoral and tibial components appear to be well situated. No fracture or dislocation is noted. No soft tissue abnormality is noted. IMPRESSION: Status post left total knee arthroplasty. No acute abnormality seen in the left knee. Electronically Signed   By: Marijo Conception M.D.   On: 01/26/2020 14:45   DG Knee Right Port  Result Date: 01/26/2020 CLINICAL DATA:  Fall. EXAM: PORTABLE RIGHT KNEE - 1-2 VIEW COMPARISON:  None. FINDINGS: Status post right total knee arthroplasty. The femoral and tibial components appear to be well situated. No fracture or dislocation is noted. IMPRESSION: Status post right total knee arthroplasty. No acute abnormality seen in the right knee. Electronically Signed   By: Marijo Conception M.D.   On: 01/26/2020 14:48    Assessment/Plan:  Left acetabular fracture with history of left hip hemiarthroplasty: - continue with non-operative treatment, reviewed this was son Ed yesterday - continue with comfort care plan as per Palliative care - okay for bed mobility - continue SCD's - agree with plan for discharge to SNF    Contact information:   Weekdays 8-5 Merlene Pulling, PA-C [phone] A fter hours and holidays please check [email or web address] for group call information for [facility]  Ventura Bruns 01/28/2020, 10:22 AM

## 2020-01-29 MED ORDER — ACETAMINOPHEN 500 MG PO TABS: 500.0000 mg | ORAL_TABLET | ORAL | Status: DC | PRN

## 2020-01-29 NOTE — TOC Initial Note (Signed)
Transition of Care [facility]) - Initial/Assessment Note    Patient Details  Name: [name] MRN: [id number] Date of Birth: [birth year]  Transition [facility] Care [facility]) CM/SW Contact:    Curlene Labrum, RN Phone Number: 01/29/2020, 12:21 PM  Clinical Narrative:                 Case Management is following the patient for transitions of care.  The patient resides on the [place] side at [place] and is being followed by [facility] palliative care - note from [facility].  I called and spoke with Narda Rutherford, CM at [facility] SNF and she was made aware that the patient has a Left hip fracture and laceration repair to scalp after fall.  The facility is aware that the patient's hip fracture is going to be treated medically and she will need a private room at the facility due to her shingles.  I faxed clinicals to Deirdre Pippins at [facility] SNF.  Expected Discharge Plan: [place] (Palliative care to follow patient at [place]) Barriers to Discharge: Continued Medical Work up   Patient Goals and CMS Choice Patient states their goals for this hospitalization and ongoing recovery are:: Family is requesting patient to be followed by Palliative care at [place]      Expected Discharge Plan and Services Expected Discharge Plan: [place] (Palliative care to follow patient at [place])     Post Acute Care Choice: [place] Living arrangements for the past 2 months: [place]                                      Prior Living Arrangements/Services Living arrangements for the past 2 months: [place] Lives with:: Facility Resident Patient language and need for interpreter reviewed:: Yes        Need for Family Participation in Patient Care: Yes (Comment) Care giver support system in place?: Yes (comment)   Criminal Activity/Legal  Involvement Pertinent to Current Situation/Hospitalization: No - Comment as needed  Activities of Daily Living      Permission Sought/Granted Permission sought to share information with : Case Manager          Permission granted to share info w Relationship: son,     Emotional Assessment       Orientation: : Oriented to Self, Oriented to Place Alcohol / Substance Use: Not Applicable Psych Involvement: No (comment)  Admission diagnosis:  Fall [W19.XXXA] Acetabular fracture ([place]) [S32.409A] Closed displaced fracture of left acetabulum, unspecified portion of acetabulum, initial encounter [facility]) [S32.402A] Patient Active Problem List   Diagnosis Date Noted  . Failure to thrive in adult   . Encounter for hospice care discussion   . Acetabular fracture ([place]) 01/26/2020  . Leukocytosis 01/26/2020  . Lactic acidosis 01/26/2020  . History of breast cancer 01/26/2020  . Fall 01/26/2020  . Early onset Alzheimer's dementia ([place]) 01/26/2020  . DNR (do not resuscitate) 01/26/2020  . Persistent atrial fibrillation ([place]) 01/26/2020  . Chronic anticoagulation 01/26/2020  . Goals of care, counseling/discussion   . Palliative care by specialist    PCP:  No primary care provider on file. Pharmacy:  No Pharmacies Listed    Social Determinants of Health (SDOH) Interventions    Readmission Risk Interventions No flowsheet data found.

## 2020-01-29 NOTE — TOC CAGE-AID Note (Signed)
Transition of Care [facility]) - CAGE-AID Screening   Patient Details  Name: [name] MRN: [id number] Date of Birth: 12/24/1930  Transition of Care [facility]) CM/SW Contact:    Emeterio Reeve, [place] Phone Number: 01/29/2020, 4:36 PM   Clinical Narrative:  Patient unable to participate due to memory impairment.   CAGE-AID Screening: Substance Abuse Screening unable to be completed due to: : Patient unable to participate               [place] Clinical Social Worker [phone]

## 2020-01-29 NOTE — Discharge Instructions (Signed)

## 2020-01-29 NOTE — Plan of Care (Signed)
  Problem: Health Behavior/Discharge Planning: Goal: Ability to manage health-related needs will improve Outcome: Not Progressing   Problem: Pain Managment: Goal: General experience of comfort will improve Outcome: Progressing   

## 2020-01-29 NOTE — Progress Notes (Signed)
[facility]                                                                              Patient Demographics  [name], is a 86 y.o. female, DOB - 1931/02/18, QQI:[id number]  Admit date - 01/26/2020   Admitting Physician Norval Morton, MD  Outpatient Primary MD for the patient is Dr. Serita Grammes  Outpatient specialists:   LOS - 3  days   Medical records reviewed and are as summarized below:    Chief Complaint  Patient presents with  . Level 2 Fall       Brief summary   Patient is 86 year old female with history of hypertension, paroxysmal A. fib on Eliquis, breast cancer, dementia presented from [facility] after mechanical fall.  Patient had no recollection of the events that led to her falling on the morning of admission in the bathroom.  She sustained a laceration to the right side of the scalp.  Patient complained of left leg pain when moving it.  CT of the left hip showed nonunited nondisplaced fracture of the anterior superior testicle extending to the anterior column, Periprosthetic loosening Orthopedics was consulted, seen by Dr. Mardelle Matte, recommended nonsurgical management.  Patient son, [name], requested palliative    Assessment & Plan    Principal Problem: Left periprostatic acetabular fracture (HCC) -  secondary to mechanical fall i at the facility -Per orthopedics, Dr. Mardelle Matte, nonsurgical management is the best course of action given comorbidities -Continue pain control, bowel regimen, palliative to follow at the facility   Active Problems: SIRS with E. coli acute lower UTI -Patient presented with lactic acidosis, leukocytosis, tachycardia -Urine culture showed more than 100,000 colonies of E. Coli, -Continue IV Rocephin, blood cultures negative  Shingles: 2  vesicular lesions on the right buttock, complaining pruritus but no pain - will monitor the lesions closely, started on Valtrex for 7 days  Rash on the upper back,  upper thighs and bilateral groin area -Wound care consulted, appreciate recommendations -Nystatin ointment ordered  History of breast CA -BRCA positive, continue tamoxifen  Hypothyroidism Continue Synthroid  Dementia -Appreciate palliative medicine recommendations   Obesity Estimated body mass index is 32.01 kg/m as calculated from the following:   Height as of this encounter: 5' 2"  (1.575 m).   Weight as of this encounter: 79.4 kg.  Code Status: DNR DVT Prophylaxis:  SCD's Family Communication: Discussed all imaging results, lab results, explained to the patient's son, Kynlea Blackston   on 9/15   Disposition Plan:     Status is: Inpatient  Remains inpatient appropriate because:Inpatient level of care appropriate due to severity of illness   Dispo: The patient is from: SNF              Anticipated d/c is to: SNF              Anticipated d/c date is: 3 days              Patient currently is not medically stable to d/c.  Patient currently in the semiprivate room at the facility, will need either private room or shingle lesions to crust  Time Spent in minutes   19mns    Procedures:  None   Consultants:   Ortho  Palliative   Antimicrobials:   Anti-infectives (From admission, onward)   Start     Dose/Rate Route Frequency Ordered Stop   01/28/20 1030  valACYclovir (VALTREX) tablet 1,000 mg        1,000 mg Oral 2 times daily 01/28/20 1019 02/04/20 0959   01/26/20 1800  cefTRIAXone (ROCEPHIN) 1 g in sodium chloride 0.9 % 100 mL IVPB        1 g 200 mL/hr over 30 Minutes Intravenous Every 24 hours 01/26/20 1738           Medications  Scheduled Meds: . amiodarone  100 mg Oral Daily  . apixaban  2.5 mg Oral BID  . baclofen  10 mg Oral TID  . benazepril  5 mg Oral Daily  . gabapentin  200 mg Oral BID  . Gerhardt's butt cream   Topical QID  . levothyroxine  50 mcg Oral QAC breakfast  . nystatin-triamcinolone  1 application Topical TID  . senna  1 tablet  Oral Daily  . tamoxifen  10 mg Oral Daily  . valACYclovir  1,000 mg Oral BID   Continuous Infusions: . cefTRIAXone (ROCEPHIN)  IV Stopped (01/28/20 2026)   PRN Meds:.acetaminophen, HYDROcodone-acetaminophen, morphine injection      Subjective:   BTatiyanna Lashleywas seen and examined today.  No acute complaints, appears to be comfortable, no fevers or chills, pain controlled.    Objective:   Vitals:   01/28/20 2100 01/29/20 0100 01/29/20 0500 01/29/20 0958  BP: 102/69 122/69 128/72 103/68  Pulse: 88 97 82 89  Resp: 18 18 17 18   Temp: 98.3 F (36.8 C) 97.7 F (36.5 C) 98.2 F (36.8 C) 98.7 F (37.1 C)  TempSrc: Oral Axillary Oral Oral  SpO2: 96% 96% 98% 95%  Weight:      Height:       No intake or output data in the 24 hours ending 01/29/20 1522   Wt Readings from Last 3 Encounters:  01/26/20 79.4 kg   Physical Exam  General: Alert and oriented x self and place, NAD  Cardiovascular: S1 S2 clear, RRR. No pedal edema b/l  Respiratory: CTAB, no wheezing, rales or rhonchi  Gastrointestinal: Soft, nontender, nondistended, NBS  Ext: no pedal edema bilaterally  Neuro: no new deficits  Musculoskeletal: No cyanosis, clubbing  Skin: Rash on the upper back and in the groin area, 2 small vesicular lesion on the right buttock  Psych: dementia       Data Reviewed:  I have personally reviewed following labs and imaging studies  Micro Results Recent Results (from the past 240 hour(s))  SARS Coronavirus 2 by RT PCR ([facility]) Nasopharyngeal Nasopharyngeal Swab     Status: None   Collection Time: 01/26/20  4:06 PM   Specimen: Nasopharyngeal Swab  Result Value Ref Range Status   SARS Coronavirus 2 NEGATIVE NEGATIVE Final    Comment: (NOTE) SARS-CoV-2 target nucleic acids are NOT DETECTED.  The SARS-CoV-2 RNA is generally detectable in upper and lower respiratory specimens during the acute phase of infection. The  lowest concentration of SARS-CoV-2 viral copies this assay can detect is 250 copies / mL. A negative result does not preclude SARS-CoV-2 infection and should not be used as the sole basis for treatment or other patient management decisions.  A negative result may occur with improper specimen collection /  handling, submission of specimen other than nasopharyngeal swab, presence of viral mutation(s) within the areas targeted by this assay, and inadequate number of viral copies (<250 copies / mL). A negative result must be combined with clinical observations, patient history, and epidemiological information.  Fact Sheet for Patients:   StrictlyIdeas.no  Fact Sheet for Healthcare Providers: BankingDealers.co.za  This test is not yet approved or  cleared by the [place] FDA and has been authorized for detection and/or diagnosis of SARS-CoV-2 by FDA under an Emergency Use Authorization (EUA).  This EUA will remain in effect (meaning this test can be used) for the duration of the COVID-19 declaration under Section 564(b)(1) of the Act, 21 U.S.C. section 360bbb-3(b)(1), unless the authorization is terminated or revoked sooner.  Performed at [facility], [place] [address], [place], [place] [address]   Culture, blood (routine x 2)     Status: None (Preliminary result)   Collection Time: 01/26/20  6:16 PM   Specimen: BLOOD  Result Value Ref Range Status   Specimen Description BLOOD LEFT ANTECUBITAL  Final   Special Requests   Final    BOTTLES DRAWN AEROBIC AND ANAEROBIC Blood Culture results may not be optimal due to an inadequate volume of blood received in culture bottles   Culture   Final    NO GROWTH 3 DAYS Performed at [facility], [place] [address] [place], [place] [address]    Report Status PENDING  Incomplete  Culture, Urine     Status: Abnormal   Collection Time: 01/26/20  6:20 PM   Specimen: Urine, Clean Catch   Result Value Ref Range Status   Specimen Description URINE, CLEAN CATCH  Final   Special Requests   Final    NONE Performed at [facility], [place] [address], [place], [place] [address]    Culture >=100,000 COLONIES/mL ESCHERICHIA COLI (A)  Final   Report Status 01/28/2020 FINAL  Final   Organism ID, Bacteria ESCHERICHIA COLI (A)  Final      Susceptibility   Escherichia coli - MIC*    AMPICILLIN >=32 RESISTANT Resistant     CEFAZOLIN <=4 SENSITIVE Sensitive     CEFTRIAXONE <=0.25 SENSITIVE Sensitive     CIPROFLOXACIN <=0.25 SENSITIVE Sensitive     GENTAMICIN <=1 SENSITIVE Sensitive     IMIPENEM <=0.25 SENSITIVE Sensitive     NITROFURANTOIN <=16 SENSITIVE Sensitive     TRIMETH/SULFA <=20 SENSITIVE Sensitive     AMPICILLIN/SULBACTAM 16 INTERMEDIATE Intermediate     PIP/TAZO <=4 SENSITIVE Sensitive     * >=100,000 COLONIES/mL ESCHERICHIA COLI  Culture, blood (routine x 2)     Status: None (Preliminary result)   Collection Time: 01/27/20  1:14 AM   Specimen: BLOOD RIGHT HAND  Result Value Ref Range Status   Specimen Description BLOOD RIGHT HAND  Final   Special Requests   Final    BOTTLES DRAWN AEROBIC AND ANAEROBIC Blood Culture adequate volume   Culture   Final    NO GROWTH 2 DAYS Performed at [facility], [address] [place], [place] [address]    Report Status PENDING  Incomplete    Radiology Reports CT HEAD WO CONTRAST  Result Date: 01/26/2020 CLINICAL DATA:  Fall on blood thinner EXAM: CT HEAD WITHOUT CONTRAST TECHNIQUE: Contiguous axial images were obtained from the base of the skull through the vertex without intravenous contrast. COMPARISON:  None. FINDINGS: Brain: No evidence of acute territorial infarction, hemorrhage, hydrocephalus,extra-axial collection or mass lesion/mass  effect. There is dilatation the ventricles and sulci consistent with age-related atrophy. Low-attenuation changes in the deep white matter consistent with small vessel ischemia.  Vascular: No hyperdense vessel or unexpected calcification. Skull: The skull is intact. No fracture or focal lesion identified. Sinuses/Orbits: The visualized paranasal sinuses and mastoid air cells are clear. The orbits and globes intact. Other: None Cervical spine: Alignment: Physiologic Skull base and vertebrae: Visualized skull base is intact. No atlanto-occipital dissociation. The vertebral body heights are well maintained. No fracture or pathologic osseous lesion seen. Soft tissues and spinal canal: The visualized paraspinal soft tissues are unremarkable. No prevertebral soft tissue swelling is seen. The spinal canal is grossly unremarkable, no large epidural collection or significant canal narrowing. Disc levels: Multilevel cervical spine spondylosis is seen with anterior osteophytes disc osteophyte complex and uncovertebral osteophytes most notable at C5-C6 with moderate neural foraminal narrowing and mild central canal stenosis. Upper chest: The lung apices are clear. Thoracic inlet is within normal limits. Other: None IMPRESSION: No acute intracranial abnormality. Findings consistent with age related atrophy and chronic small vessel ischemia No acute fracture or malalignment of the spine. Electronically Signed   By: Prudencio Pair M.D.   On: 01/26/2020 15:54   CT CERVICAL SPINE WO CONTRAST  Result Date: 01/26/2020 CLINICAL DATA:  Fall on blood thinner EXAM: CT HEAD WITHOUT CONTRAST TECHNIQUE: Contiguous axial images were obtained from the base of the skull through the vertex without intravenous contrast. COMPARISON:  None. FINDINGS: Brain: No evidence of acute territorial infarction, hemorrhage, hydrocephalus,extra-axial collection or mass lesion/mass effect. There is dilatation the ventricles and sulci consistent with age-related atrophy. Low-attenuation changes in the deep white matter consistent with small vessel ischemia. Vascular: No hyperdense vessel or unexpected calcification. Skull: The skull is  intact. No fracture or focal lesion identified. Sinuses/Orbits: The visualized paranasal sinuses and mastoid air cells are clear. The orbits and globes intact. Other: None Cervical spine: Alignment: Physiologic Skull base and vertebrae: Visualized skull base is intact. No atlanto-occipital dissociation. The vertebral body heights are well maintained. No fracture or pathologic osseous lesion seen. Soft tissues and spinal canal: The visualized paraspinal soft tissues are unremarkable. No prevertebral soft tissue swelling is seen. The spinal canal is grossly unremarkable, no large epidural collection or significant canal narrowing. Disc levels: Multilevel cervical spine spondylosis is seen with anterior osteophytes disc osteophyte complex and uncovertebral osteophytes most notable at C5-C6 with moderate neural foraminal narrowing and mild central canal stenosis. Upper chest: The lung apices are clear. Thoracic inlet is within normal limits. Other: None IMPRESSION: No acute intracranial abnormality. Findings consistent with age related atrophy and chronic small vessel ischemia No acute fracture or malalignment of the spine. Electronically Signed   By: Prudencio Pair M.D.   On: 01/26/2020 15:54   DG Pelvis Portable  Result Date: 01/26/2020 CLINICAL DATA:  Fall. EXAM: PORTABLE PELVIS 1-2 VIEWS COMPARISON:  November 08, 2009.  December 15, 2018. FINDINGS: Status post left hip arthroplasty. There is interval development of mildly displaced periprosthetic left acetabular fracture. Right hip is unremarkable. Degenerative changes seen involving both sacroiliac joints. IMPRESSION: Interval development of mildly displaced periprosthetic left acetabular fracture. Electronically Signed   By: Marijo Conception M.D.   On: 01/26/2020 14:50   CT Hip Left Wo Contrast  Result Date: 01/26/2020 CLINICAL DATA:  Pain after fall EXAM: CT OF THE LEFT HIP WITHOUT CONTRAST TECHNIQUE: Multidetector CT imaging of the left hip was performed according  to the standard protocol. Multiplanar CT image reconstructions were  also generated. COMPARISON:  None. FINDINGS: Bones/Joint/Cartilage The patient is status post left total hip arthroplasty. There is a nonunited fracture seen at the anterior superior acetabulum extending through the anterior column. There is a tiny fracture fragment seen along the lateral acetabular rim. The femoral head component is still well seated within the acetabular component. There appears to be a area periprosthetic lucency seen around the the inferior acetabular cup measuring up to 1 cm, slightly more progressed since the prior exam. Ligaments Suboptimally assessed by CT. Muscles and Tendons The muscles surrounding the hip appear to be intact. There is mild fatty atrophy of the muscles. There is limited visualization of the tendons, the hamstrings and iliopsoas tendons appear to be intact. Soft tissues Mild subcutaneous edema seen along the lateral aspect of the hip. The bladder is fluid-filled and distended. Scattered vascular calcifications are noted. IMPRESSION: 1. Left total hip arthroplasty with a nonunited nondisplaced fracture of the anterior superior acetabulum extending through the anterior column. 2. Slight interval increase in the periprosthetic lucency in the inferior portion of the acetabular cup, likely consistent with periprosthetic loosening. Electronically Signed   By: Prudencio Pair M.D.   On: 01/26/2020 18:16   DG Chest Port 1 View  Result Date: 01/26/2020 CLINICAL DATA:  Fall. EXAM: PORTABLE CHEST 1 VIEW COMPARISON:  December 04, 2019.  Sep 18, 2019. FINDINGS: The heart size and mediastinal contours are within normal limits. Both lungs are clear. No pneumothorax or pleural effusion is noted. Stable chronic anterior dislocation involving proximal right humerus is noted. IMPRESSION: No active disease. Electronically Signed   By: Marijo Conception M.D.   On: 01/26/2020 14:47   DG Shoulder Right Port  Result Date:  01/26/2020 CLINICAL DATA:  Fall. EXAM: PORTABLE RIGHT SHOULDER COMPARISON:  Sep 18, 2019. FINDINGS: There is stable anterior dislocation or subluxation of the right proximal humeral head relative to the glenoid labrum consistent with chronic dislocation. Deformity of right humeral head is noted consistent with chronic degenerative change. No acute fracture is noted. IMPRESSION: Stable chronic anterior dislocation or subluxation of right proximal humeral head relative to the glenoid labrum. Chronic degenerative change of right humeral head is noted. Electronically Signed   By: Marijo Conception M.D.   On: 01/26/2020 14:51   DG Knee Left Port  Result Date: 01/26/2020 CLINICAL DATA:  Status post fall. EXAM: PORTABLE LEFT KNEE - 1-2 VIEW COMPARISON:  None. FINDINGS: Status post left total knee arthroplasty. The femoral and tibial components appear to be well situated. No fracture or dislocation is noted. No soft tissue abnormality is noted. IMPRESSION: Status post left total knee arthroplasty. No acute abnormality seen in the left knee. Electronically Signed   By: Marijo Conception M.D.   On: 01/26/2020 14:45   DG Knee Right Port  Result Date: 01/26/2020 CLINICAL DATA:  Fall. EXAM: PORTABLE RIGHT KNEE - 1-2 VIEW COMPARISON:  None. FINDINGS: Status post right total knee arthroplasty. The femoral and tibial components appear to be well situated. No fracture or dislocation is noted. IMPRESSION: Status post right total knee arthroplasty. No acute abnormality seen in the right knee. Electronically Signed   By: Marijo Conception M.D.   On: 01/26/2020 14:48    Lab Data:  CBC: Recent Labs  Lab 01/26/20 1404 01/27/20 0114  WBC 11.1* 10.7*  HGB 13.6 12.7  HCT 44.6 41.6  MCV 92.5 91.6  PLT 273 094   Basic Metabolic Panel: Recent Labs  Lab 01/26/20 1404 01/27/20 0114  NA 140 141  K 4.2 3.7  CL 103 106  CO2 25 24  GLUCOSE 110* 108*  BUN 20 19  CREATININE 0.59 0.58  CALCIUM 9.4 8.9   GFR: Estimated  Creatinine Clearance: 48.3 mL/min (by C-G formula based on SCr of 0.58 mg/dL). Liver Function Tests: Recent Labs  Lab 01/26/20 1404  AST 13*  ALT 11  ALKPHOS 49  BILITOT 0.5  PROT 6.8  ALBUMIN 3.2*   No results for input(s): LIPASE, AMYLASE in the last 168 hours. No results for input(s): AMMONIA in the last 168 hours. Coagulation Profile: Recent Labs  Lab 01/26/20 1404  INR 1.3*   Cardiac Enzymes: No results for input(s): CKTOTAL, CKMB, CKMBINDEX, TROPONINI in the last 168 hours. BNP (last 3 results) No results for input(s): PROBNP in the last 8760 hours. HbA1C: No results for input(s): HGBA1C in the last 72 hours. CBG: No results for input(s): GLUCAP in the last 168 hours. Lipid Profile: No results for input(s): CHOL, HDL, LDLCALC, TRIG, CHOLHDL, LDLDIRECT in the last 72 hours. Thyroid Function Tests: No results for input(s): TSH, T4TOTAL, FREET4, T3FREE, THYROIDAB in the last 72 hours. Anemia Panel: No results for input(s): VITAMINB12, FOLATE, FERRITIN, TIBC, IRON, RETICCTPCT in the last 72 hours. Urine analysis:    Component Value Date/Time   COLORURINE YELLOW 01/26/2020 1606   APPEARANCEUR CLOUDY (A) 01/26/2020 1606   LABSPEC 1.011 01/26/2020 1606   PHURINE 5.0 01/26/2020 1606   GLUCOSEU NEGATIVE 01/26/2020 1606   HGBUR SMALL (A) 01/26/2020 1606   BILIRUBINUR NEGATIVE 01/26/2020 1606   KETONESUR NEGATIVE 01/26/2020 1606   PROTEINUR NEGATIVE 01/26/2020 1606   NITRITE NEGATIVE 01/26/2020 1606   LEUKOCYTESUR LARGE (A) 01/26/2020 1606     April Colter M.D. [facility] 01/29/2020, 3:22 PM   Call night coverage person covering after 7pm

## 2020-01-30 LAB — CBC
HCT: 34.2 % — ABNORMAL LOW (ref 36.0–46.0)
Hemoglobin: 10.4 g/dL — ABNORMAL LOW (ref 12.0–15.0)
MCH: 27.9 pg (ref 26.0–34.0)
MCHC: 30.4 g/dL (ref 30.0–36.0)
MCV: 91.7 fL (ref 80.0–100.0)
Platelets: 227 10*3/uL (ref 150–400)
RBC: 3.73 MIL/uL — ABNORMAL LOW (ref 3.87–5.11)
RDW: 14 % (ref 11.5–15.5)
WBC: 7.5 10*3/uL (ref 4.0–10.5)
nRBC: 0 % (ref 0.0–0.2)

## 2020-01-30 LAB — BASIC METABOLIC PANEL
Anion gap: 9 (ref 5–15)
BUN: 14 mg/dL (ref 8–23)
CO2: 23 mmol/L (ref 22–32)
Calcium: 8.5 mg/dL — ABNORMAL LOW (ref 8.9–10.3)
Chloride: 105 mmol/L (ref 98–111)
Creatinine, Ser: 0.46 mg/dL (ref 0.44–1.00)
GFR calc Af Amer: >60 mL/min (ref >60)
GFR calc non Af Amer: >60 mL/min (ref >60)
Glucose, Bld: 115 mg/dL — ABNORMAL HIGH (ref 70–99)
Potassium: 4.4 mmol/L (ref 3.5–5.1)
Sodium: 137 mmol/L (ref 135–145)

## 2020-01-30 NOTE — Plan of Care (Signed)

## 2020-01-30 NOTE — Progress Notes (Signed)
PROGRESS NOTE    [name]  NLG:[id number] DOB: 11-19-1930 DOA: 01/26/2020 PCP: No primary care provider on file.   Brief Narrative:  This 86 year old female with history of hypertension, paroxysmal A. fib on Eliquis, breast cancer, dementia presented from [facility] after mechanical fall.  Patient had no recollection of the events that led to her falling on the morning of admission in the bathroom.  She sustained a laceration to the right side of the scalp.  Patient complained of left leg pain when moving it.  CT of the left hip showed nonunited nondisplaced fracture of the anterior superior acetabulum extending to the anterior column, Periprosthetic loosening. Orthopedics was consulted, seen by Dr. Mardelle Matte, recommended nonsurgical management.  Patient son, [name], requested palliative care consultation.  Assessment & Plan:   Principal Problem:   Acetabular fracture ([place]) Active Problems:   Leukocytosis   Lactic acidosis   History of breast cancer   Fall   Early onset Alzheimer's dementia ([place])   DNR (do not resuscitate)   Persistent atrial fibrillation (HCC)   Chronic anticoagulation   Left periprostatic acetabular fracture (HCC) -  secondary to mechanical fall  at the facility -Per orthopedics, Dr. Mardelle Matte, nonsurgical management is the best course of action given comorbidities -Continue pain control, bowel regimen, palliative to follow at the facility. - Son doesn't want any aggressive intervention.  SIRS with E. coli acute lower UTI -Patient presented with lactic acidosis, leukocytosis, tachycardia -Urine culture showed more than 100,000 colonies of E. Coli, -Continue IV Rocephin, blood cultures negative.  Shingles: 2  vesicular lesions on the right buttock, complaining pruritus but no pain - will monitor the lesions closely, started on Valtrex for 7 days.  Rash on the upper back, upper thighs and bilateral groin area -Wound care consulted, appreciate  recommendations. -Nystatin ointment ordered  History of breast CA -BRCA positive, continue tamoxifen.  Hypothyroidism Continue Synthroid.  Dementia -Appreciate palliative medicine recommendations   Obesity Estimated body mass index is 32.01 kg/m as calculated from the following:   Height as of this encounter: _0  (1.575 m).   Weight as of this encounter: 79.4 kg.    DVT prophylaxis: SCDs. Code Status: DNR Family Communication: No one at bed side.  Disposition Plan:  Status is: Inpatient  Remains inpatient appropriate because:Inpatient level of care appropriate due to severity of illness   Dispo: The patient is from: SNF              Anticipated d/c is to: SNF              Anticipated d/c date is: 3 days              Patient currently is not medically stable to d/c.  Patient currently in the semiprivate room at the facility, will need either private room or shingle lesions to crust.    Consultants:   Orthopaedics  Palliative care.  Procedures:   Antimicrobials:  Anti-infectives (From admission, onward)   Start     Dose/Rate Route Frequency Ordered Stop   01/28/20 1030  valACYclovir (VALTREX) tablet 1,000 mg        1,000 mg Oral 2 times daily 01/28/20 1019 02/04/20 0959   01/26/20 1800  cefTRIAXone (ROCEPHIN) 1 g in sodium chloride 0.9 % 100 mL IVPB        1 g 200 mL/hr over 30 Minutes Intravenous Every 24 hours 01/26/20 1738        Subjective: Patient was seen and examined at  bedside.  Overnight events noted.  Patient is sleepy but arousable.  She appears to be comfortable.  Objective: Vitals:   01/29/20 1721 01/29/20 1900 01/30/20 0500 01/30/20 0916  BP: (!) 90/59 (!) 91/58 91/68 (!) 103/58  Pulse: 68 68 70 77  Resp: _0 Temp: 98.5 F (36.9 C) 98.6 F (37 C) 98.5 F (36.9 C) 97.7 F (36.5 C)  TempSrc: Oral Axillary Axillary Oral  SpO2: 95% 93% 95% 92%  Weight:      Height:        Intake/Output Summary (Last 24 hours) at  01/30/2020 1721 Last data filed at 01/30/2020 1000 Gross per 24 hour  Intake 360 ml  Output 500 ml  Net -140 ml   Filed Weights   01/26/20 1458  Weight: 79.4 kg    Examination:  General exam: Appears calm and comfortable.  Respiratory system: Clear to auscultation. Respiratory effort normal. Cardiovascular system: S1 & S2 heard, RRR. No JVD, murmurs, rubs, gallops or clicks. No pedal edema. Gastrointestinal system: Abdomen is nondistended, soft and nontender. No organomegaly or masses felt. Normal bowel sounds heard. Central nervous system: Alert and oriented. No focal neurological deficits. Extremities:  No edema, no cyanosis, no clubbing. Skin: Rash on the upper back and in the groin area,  vesicular lesions noted on the right buttock Psychiatry: Judgement and insight appear normal. Mood & affect appropriate.     Data Reviewed: I have personally reviewed following labs and imaging studies  CBC: Recent Labs  Lab 01/26/20 1404 01/27/20 0114 01/30/20 0146  WBC 11.1* 10.7* 7.5  HGB 13.6 12.7 10.4*  HCT 44.6 41.6 34.2*  MCV 92.5 91.6 91.7  PLT 273 264 856   Basic Metabolic Panel: Recent Labs  Lab 01/26/20 1404 01/27/20 0114 01/30/20 0146  NA 140 141 137  K 4.2 3.7 4.4  CL 103 106 105  CO2 _1 GLUCOSE 110* 108* 115*  BUN _2 CREATININE 0.59 0.58 0.46  CALCIUM 9.4 8.9 8.5*   GFR: Estimated Creatinine Clearance: 48.3 mL/min (by C-G formula based on SCr of 0.46 mg/dL). Liver Function Tests: Recent Labs  Lab 01/26/20 1404  AST 13*  ALT 11  ALKPHOS 49  BILITOT 0.5  PROT 6.8  ALBUMIN 3.2*   No results for input(s): LIPASE, AMYLASE in the last 168 hours. No results for input(s): AMMONIA in the last 168 hours. Coagulation Profile: Recent Labs  Lab 01/26/20 1404  INR 1.3*   Cardiac Enzymes: No results for input(s): CKTOTAL, CKMB, CKMBINDEX, TROPONINI in the last 168 hours. BNP (last 3 results) No results for input(s): PROBNP in the last 8760  hours. HbA1C: No results for input(s): HGBA1C in the last 72 hours. CBG: No results for input(s): GLUCAP in the last 168 hours. Lipid Profile: No results for input(s): CHOL, HDL, LDLCALC, TRIG, CHOLHDL, LDLDIRECT in the last 72 hours. Thyroid Function Tests: No results for input(s): TSH, T4TOTAL, FREET4, T3FREE, THYROIDAB in the last 72 hours. Anemia Panel: No results for input(s): VITAMINB12, FOLATE, FERRITIN, TIBC, IRON, RETICCTPCT in the last 72 hours. Sepsis Labs: Recent Labs  Lab 01/26/20 1404 01/26/20 1745 01/27/20 0114  LATICACIDVEN 2.2* 1.7 1.0    Recent Results (from the past 240 hour(s))  SARS Coronavirus 2 by RT PCR (hospital order, performed in [facility] hospital lab) Nasopharyngeal Nasopharyngeal Swab     Status: None   Collection Time: 01/26/20  4:06 PM   Specimen: Nasopharyngeal Swab  Result Value Ref Range Status  SARS Coronavirus 2 NEGATIVE NEGATIVE Final    Comment: (NOTE) SARS-CoV-2 target nucleic acids are NOT DETECTED.  The SARS-CoV-2 RNA is generally detectable in upper and lower respiratory specimens during the acute phase of infection. The lowest concentration of SARS-CoV-2 viral copies this assay can detect is 250 copies / mL. A negative result does not preclude SARS-CoV-2 infection and should not be used as the sole basis for treatment or other patient management decisions.  A negative result may occur with improper specimen collection / handling, submission of specimen other than nasopharyngeal swab, presence of viral mutation(s) within the areas targeted by this assay, and inadequate number of viral copies (<250 copies / mL). A negative result must be combined with clinical observations, patient history, and epidemiological information.  Fact Sheet for Patients:   StrictlyIdeas.no  Fact Sheet for Healthcare Providers: BankingDealers.co.za  This test is not yet approved or  cleared by the [place] FDA and has been authorized for detection and/or diagnosis of SARS-CoV-2 by FDA under an Emergency Use Authorization (EUA).  This EUA will remain in effect (meaning this test can be used) for the duration of the COVID-19 declaration under Section 564(b)(1) of the Act, 21 U.S.C. section 360bbb-3(b)(1), unless the authorization is terminated or revoked sooner.  Performed at [facility], [place] [address], [place], [place] [address]   Culture, blood (routine x 2)     Status: None (Preliminary result)   Collection Time: 01/26/20  6:16 PM   Specimen: BLOOD  Result Value Ref Range Status   Specimen Description BLOOD LEFT ANTECUBITAL  Final   Special Requests   Final    BOTTLES DRAWN AEROBIC AND ANAEROBIC Blood Culture results may not be optimal due to an inadequate volume of blood received in culture bottles   Culture   Final    NO GROWTH 4 DAYS Performed at [facility], [place] [address], [place], [place] [address]    Report Status PENDING  Incomplete  Culture, Urine     Status: Abnormal   Collection Time: 01/26/20  6:20 PM   Specimen: Urine, Clean Catch  Result Value Ref Range Status   Specimen Description URINE, CLEAN CATCH  Final   Special Requests   Final    NONE Performed at [facility], [place] [address], [place], [place] [address]    Culture >=100,000 COLONIES/mL ESCHERICHIA COLI (A)  Final   Report Status 01/28/2020 FINAL  Final   Organism ID, Bacteria ESCHERICHIA COLI (A)  Final      Susceptibility   Escherichia coli - MIC*    AMPICILLIN >=32 RESISTANT Resistant     CEFAZOLIN <=4 SENSITIVE Sensitive     CEFTRIAXONE <=0.25 SENSITIVE Sensitive     CIPROFLOXACIN <=0.25 SENSITIVE Sensitive     GENTAMICIN <=1 SENSITIVE Sensitive     IMIPENEM <=0.25 SENSITIVE Sensitive     NITROFURANTOIN <=16 SENSITIVE Sensitive     TRIMETH/SULFA <=20 SENSITIVE Sensitive     AMPICILLIN/SULBACTAM 16 INTERMEDIATE Intermediate     PIP/TAZO <=4 SENSITIVE Sensitive     *  >=100,000 COLONIES/mL ESCHERICHIA COLI  Culture, blood (routine x 2)     Status: None (Preliminary result)   Collection Time: 01/27/20  1:14 AM   Specimen: BLOOD RIGHT HAND  Result Value Ref Range Status   Specimen Description BLOOD RIGHT HAND  Final   Special Requests   Final    BOTTLES DRAWN AEROBIC AND ANAEROBIC Blood Culture adequate volume   Culture  Final    NO GROWTH 3 DAYS Performed at [facility], [place] [address], [place], [place] [address]    Report Status PENDING  Incomplete     Radiology Studies: No results found.  Scheduled Meds: . amiodarone  100 mg Oral Daily  . apixaban  2.5 mg Oral BID  . baclofen  10 mg Oral TID  . benazepril  5 mg Oral Daily  . gabapentin  200 mg Oral BID  . Gerhardt's butt cream   Topical QID  . levothyroxine  50 mcg Oral QAC breakfast  . nystatin-triamcinolone  1 application Topical TID  . senna  1 tablet Oral Daily  . tamoxifen  10 mg Oral Daily  . valACYclovir  1,000 mg Oral BID   Continuous Infusions: . cefTRIAXone (ROCEPHIN)  IV 1 g (01/30/20 1628)     LOS: 4 days    Time spent: 25 mins.    Shawna Clamp, MD [facility]   If 7PM-7AM, please contact night-coverage

## 2020-01-31 LAB — CULTURE, BLOOD (ROUTINE X 2): Culture: NO GROWTH

## 2020-01-31 MED ORDER — CEFAZOLIN SODIUM-DEXTROSE 1-4 GM/50ML-% IV SOLN
1.0000 g | Freq: Three times a day (TID) | INTRAVENOUS | Status: AC
  Administered 2020-01-31 – 2020-02-01 (×4): 1 g via INTRAVENOUS
  Filled 2020-01-31 (×4): qty 50

## 2020-01-31 NOTE — Progress Notes (Signed)
PROGRESS NOTE    [name]  XBD:[id number] DOB: 1931/01/21 DOA: 01/26/2020 PCP: No primary care provider on file.   Brief Narrative:  This 86 year old female with history of hypertension, paroxysmal A. fib on Eliquis, breast cancer, dementia presented from [facility] after mechanical fall.  Patient had no recollection of the events that led to her falling on the morning of admission in the bathroom.  She sustained a laceration to the right side of the scalp.  Patient complained of left leg pain when moving it.  CT of the left hip showed nonunited nondisplaced fracture of the anterior superior acetabulum extending to the anterior column, Periprosthetic loosening. Orthopedics was consulted, seen by Dr. Mardelle Matte, recommended nonsurgical management.  Patient son, [name], requested palliative care consultation.  Assessment & Plan:   Principal Problem:   Acetabular fracture ([place]) Active Problems:   Leukocytosis   Lactic acidosis   History of breast cancer   Fall   Early onset Alzheimer's dementia ([place])   DNR (do not resuscitate)   Persistent atrial fibrillation (HCC)   Chronic anticoagulation   Left periprostatic acetabular fracture (HCC) -  secondary to mechanical fall  at the facility. -Per orthopedics, Dr. Mardelle Matte, nonsurgical management is the best course of action given comorbidities -Continue pain control, bowel regimen, palliative to follow at the facility. - Son doesn't want any aggressive intervention.  Severe sepsis due to E. coli UTI:  -Patient presented with lactic acidosis, leukocytosis, tachycardia -Urine culture showed more than 100,000 colonies of E. Coli, -Continue IV Rocephin, blood cultures negative.  ( day 6)  Shingles: 2  vesicular lesions on the right buttock, complaining pruritus but no pain - will monitor the lesions closely, started on Valtrex for 7 days.  Rash on the upper back, upper thighs and bilateral groin area -Wound care consulted, appreciate  recommendations. -Nystatin ointment ordered  History of breast CA -BRCA positive, continue tamoxifen.  Hypothyroidism Continue Synthroid.  Dementia -Appreciate palliative medicine recommendations   Obesity Estimated body mass index is 32.01 kg/m as calculated from the following:   Height as of this encounter: 5' 2"  (1.575 m).   Weight as of this encounter: 79.4 kg.    DVT prophylaxis: SCDs. Code Status: DNR Family Communication: No one at bed side.  Disposition Plan:  Status is: Inpatient  Remains inpatient appropriate because:Inpatient level of care appropriate due to severity of illness   Dispo: The patient is from: SNF              Anticipated d/c is to: SNF              Anticipated d/c date is: 3 days              Patient currently is not medically stable to d/c.  Patient currently in the semiprivate room at the facility, will need either private room or shingle lesions to crust.    Consultants:   Orthopaedics  Palliative care.  Procedures:   Antimicrobials:  Anti-infectives (From admission, onward)   Start     Dose/Rate Route Frequency Ordered Stop   01/31/20 1400  ceFAZolin (ANCEF) IVPB 1 g/50 mL premix        1 g 100 mL/hr over 30 Minutes Intravenous Every 8 hours 01/31/20 1341 02/01/20 2159   01/28/20 1030  valACYclovir (VALTREX) tablet 1,000 mg        1,000 mg Oral 2 times daily 01/28/20 1019 02/04/20 0959   01/26/20 1800  cefTRIAXone (ROCEPHIN) 1 g in sodium chloride  0.9 % 100 mL IVPB  Status:  Discontinued        1 g 200 mL/hr over 30 Minutes Intravenous Every 24 hours 01/26/20 1738 01/31/20 1341      Subjective: Patient was seen and examined at bedside.  Overnight events noted.   Patient is alert, oriented x1 lying comfortably on the bed.  She is confused but following commands.  Objective: Vitals:   01/30/20 0916 01/30/20 2014 01/31/20 0623 01/31/20 0944  BP: (!) 103/58 113/62 121/66 121/79  Pulse: 77 75 72 79  Resp: 19 17  15    Temp: 97.7 F (36.5 C) 97.8 F (36.6 C) 98.1 F (36.7 C) 98.2 F (36.8 C)  TempSrc: Oral Oral Oral Oral  SpO2: 92% 93%    Weight:      Height:        Intake/Output Summary (Last 24 hours) at 01/31/2020 1521 Last data filed at 01/30/2020 2030 Gross per 24 hour  Intake 120 ml  Output --  Net 120 ml   Filed Weights   01/26/20 1458  Weight: 79.4 kg    Examination:  General exam: Appears calm and comfortable. Confused, following commands. Respiratory system: Clear to auscultation. Respiratory effort normal. Cardiovascular system: S1 & S2 heard, RRR. No JVD, murmurs, rubs, gallops or clicks. No pedal edema. Gastrointestinal system: Abdomen is nondistended, soft and nontender. No organomegaly or masses felt.  Normal bowel sounds heard. Central nervous system: Alert and oriented. No focal neurological deficits. Extremities:  No edema, no cyanosis, no clubbing. Skin: Rash on the upper back and in the groin area,  vesicular lesions noted on the right buttock Psychiatry: Judgement and insight appear normal. Mood & affect appropriate.     Data Reviewed: I have personally reviewed following labs and imaging studies  CBC: Recent Labs  Lab 01/26/20 1404 01/27/20 0114 01/30/20 0146  WBC 11.1* 10.7* 7.5  HGB 13.6 12.7 10.4*  HCT 44.6 41.6 34.2*  MCV 92.5 91.6 91.7  PLT 273 264 109   Basic Metabolic Panel: Recent Labs  Lab 01/26/20 1404 01/27/20 0114 01/30/20 0146  NA 140 141 137  K 4.2 3.7 4.4  CL 103 106 105  CO2 25 24 23   GLUCOSE 110* 108* 115*  BUN 20 19 14   CREATININE 0.59 0.58 0.46  CALCIUM 9.4 8.9 8.5*   GFR: Estimated Creatinine Clearance: 48.3 mL/min (by C-G formula based on SCr of 0.46 mg/dL). Liver Function Tests: Recent Labs  Lab 01/26/20 1404  AST 13*  ALT 11  ALKPHOS 49  BILITOT 0.5  PROT 6.8  ALBUMIN 3.2*   No results for input(s): LIPASE, AMYLASE in the last 168 hours. No results for input(s): AMMONIA in the last 168 hours. Coagulation  Profile: Recent Labs  Lab 01/26/20 1404  INR 1.3*   Cardiac Enzymes: No results for input(s): CKTOTAL, CKMB, CKMBINDEX, TROPONINI in the last 168 hours. BNP (last 3 results) No results for input(s): PROBNP in the last 8760 hours. HbA1C: No results for input(s): HGBA1C in the last 72 hours. CBG: No results for input(s): GLUCAP in the last 168 hours. Lipid Profile: No results for input(s): CHOL, HDL, LDLCALC, TRIG, CHOLHDL, LDLDIRECT in the last 72 hours. Thyroid Function Tests: No results for input(s): TSH, T4TOTAL, FREET4, T3FREE, THYROIDAB in the last 72 hours. Anemia Panel: No results for input(s): VITAMINB12, FOLATE, FERRITIN, TIBC, IRON, RETICCTPCT in the last 72 hours. Sepsis Labs: Recent Labs  Lab 01/26/20 1404 01/26/20 1745 01/27/20 0114  LATICACIDVEN 2.2* 1.7 1.0  Recent Results (from the past 240 hour(s))  SARS Coronavirus 2 by RT PCR (hospital order, performed in [facility] hospital lab) Nasopharyngeal Nasopharyngeal Swab     Status: None   Collection Time: 01/26/20  4:06 PM   Specimen: Nasopharyngeal Swab  Result Value Ref Range Status   SARS Coronavirus 2 NEGATIVE NEGATIVE Final    Comment: (NOTE) SARS-CoV-2 target nucleic acids are NOT DETECTED.  The SARS-CoV-2 RNA is generally detectable in upper and lower respiratory specimens during the acute phase of infection. The lowest concentration of SARS-CoV-2 viral copies this assay can detect is 250 copies / mL. A negative result does not preclude SARS-CoV-2 infection and should not be used as the sole basis for treatment or other patient management decisions.  A negative result may occur with improper specimen collection / handling, submission of specimen other than nasopharyngeal swab, presence of viral mutation(s) within the areas targeted by this assay, and inadequate number of viral copies (<250 copies / mL). A negative result must be combined with clinical observations, patient history, and  epidemiological information.  Fact Sheet for Patients:   StrictlyIdeas.no  Fact Sheet for Healthcare Providers: BankingDealers.co.za  This test is not yet approved or  cleared by the [place] FDA and has been authorized for detection and/or diagnosis of SARS-CoV-2 by FDA under an Emergency Use Authorization (EUA).  This EUA will remain in effect (meaning this test can be used) for the duration of the COVID-19 declaration under Section 564(b)(1) of the Act, 21 U.S.C. section 360bbb-3(b)(1), unless the authorization is terminated or revoked sooner.  Performed at [facility], [place] [address], [place], [place] [address]   Culture, blood (routine x 2)     Status: None (Preliminary result)   Collection Time: 01/26/20  6:16 PM   Specimen: BLOOD  Result Value Ref Range Status   Specimen Description BLOOD LEFT ANTECUBITAL  Final   Special Requests   Final    BOTTLES DRAWN AEROBIC AND ANAEROBIC Blood Culture results may not be optimal due to an inadequate volume of blood received in culture bottles   Culture   Final    NO GROWTH 4 DAYS Performed at [facility], [place] [address], [place], [place] [address]    Report Status PENDING  Incomplete  Culture, Urine     Status: Abnormal   Collection Time: 01/26/20  6:20 PM   Specimen: Urine, Clean Catch  Result Value Ref Range Status   Specimen Description URINE, CLEAN CATCH  Final   Special Requests   Final    NONE Performed at [facility], [place] [address], [place], [place] [address]    Culture >=100,000 COLONIES/mL ESCHERICHIA COLI (A)  Final   Report Status 01/28/2020 FINAL  Final   Organism ID, Bacteria ESCHERICHIA COLI (A)  Final      Susceptibility   Escherichia coli - MIC*    AMPICILLIN >=32 RESISTANT Resistant     CEFAZOLIN <=4 SENSITIVE Sensitive     CEFTRIAXONE <=0.25 SENSITIVE Sensitive     CIPROFLOXACIN <=0.25 SENSITIVE Sensitive     GENTAMICIN <=1 SENSITIVE  Sensitive     IMIPENEM <=0.25 SENSITIVE Sensitive     NITROFURANTOIN <=16 SENSITIVE Sensitive     TRIMETH/SULFA <=20 SENSITIVE Sensitive     AMPICILLIN/SULBACTAM 16 INTERMEDIATE Intermediate     PIP/TAZO <=4 SENSITIVE Sensitive     * >=100,000 COLONIES/mL ESCHERICHIA COLI  Culture, blood (routine x 2)     Status: None (Preliminary result)  Collection Time: 01/27/20  1:14 AM   Specimen: BLOOD RIGHT HAND  Result Value Ref Range Status   Specimen Description BLOOD RIGHT HAND  Final   Special Requests   Final    BOTTLES DRAWN AEROBIC AND ANAEROBIC Blood Culture adequate volume   Culture   Final    NO GROWTH 3 DAYS Performed at [facility], [address], [place], [place] [address]    Report Status PENDING  Incomplete     Radiology Studies: No results found.  Scheduled Meds: . amiodarone  100 mg Oral Daily  . apixaban  2.5 mg Oral BID  . baclofen  10 mg Oral TID  . benazepril  5 mg Oral Daily  . gabapentin  200 mg Oral BID  . Gerhardt's butt cream   Topical QID  . levothyroxine  50 mcg Oral QAC breakfast  . nystatin-triamcinolone  1 application Topical TID  . senna  1 tablet Oral Daily  . tamoxifen  10 mg Oral Daily  . valACYclovir  1,000 mg Oral BID   Continuous Infusions: .  ceFAZolin (ANCEF) IV       LOS: 5 days    Time spent: 25 mins.    Shawna Clamp, MD [facility]   If 7PM-7AM, please contact night-coverage

## 2020-01-31 NOTE — Progress Notes (Signed)
Manufacturing [employer] [facility]) Hospital Liaison note.    Pt is being followed by [facility] hospital liaisons to facillitate admission to hopsice services when discharged back to [facility].    DME ordered: hospital bed confirmed delivered to [facility].   Please do not hesitate to call with questions.  Thank you for the opportunity to participate in this patients care.  Chrislyn Edison Pace, BSN, RN [place] (listed on [place] under [facility])    [phone]

## 2020-02-01 LAB — MAGNESIUM: Magnesium: 1.8 mg/dL (ref 1.7–2.4)

## 2020-02-01 LAB — BASIC METABOLIC PANEL
Anion gap: 11 (ref 5–15)
BUN: 11 mg/dL (ref 8–23)
CO2: 22 mmol/L (ref 22–32)
Calcium: 8.5 mg/dL — ABNORMAL LOW (ref 8.9–10.3)
Chloride: 104 mmol/L (ref 98–111)
Creatinine, Ser: 0.46 mg/dL (ref 0.44–1.00)
GFR calc Af Amer: >60 mL/min (ref >60)
GFR calc non Af Amer: >60 mL/min (ref >60)
Glucose, Bld: 122 mg/dL — ABNORMAL HIGH (ref 70–99)
Potassium: 4.3 mmol/L (ref 3.5–5.1)
Sodium: 137 mmol/L (ref 135–145)

## 2020-02-01 LAB — CBC
HCT: 34.1 % — ABNORMAL LOW (ref 36.0–46.0)
Hemoglobin: 10.6 g/dL — ABNORMAL LOW (ref 12.0–15.0)
MCH: 27.8 pg (ref 26.0–34.0)
MCHC: 31.1 g/dL (ref 30.0–36.0)
MCV: 89.5 fL (ref 80.0–100.0)
Platelets: 223 10*3/uL (ref 150–400)
RBC: 3.81 MIL/uL — ABNORMAL LOW (ref 3.87–5.11)
RDW: 14 % (ref 11.5–15.5)
WBC: 10.2 10*3/uL (ref 4.0–10.5)
nRBC: 0 % (ref 0.0–0.2)

## 2020-02-01 LAB — CULTURE, BLOOD (ROUTINE X 2)
Culture: NO GROWTH
Special Requests: ADEQUATE

## 2020-02-01 LAB — SARS CORONAVIRUS 2 BY RT PCR (HOSPITAL ORDER, PERFORMED IN ~~LOC~~ HOSPITAL LAB): SARS Coronavirus 2: NEGATIVE

## 2020-02-01 LAB — PHOSPHORUS: Phosphorus: 4.3 mg/dL (ref 2.5–4.6)

## 2020-02-01 MED ORDER — VALACYCLOVIR HCL 1 G PO TABS: 1000.0000 mg | ORAL_TABLET | Freq: Two times a day (BID) | ORAL | 0 refills | Status: AC

## 2020-02-01 NOTE — Care Management Important Message (Signed)
Important Message  Patient Details  Name: [name] MRN: [id number] Date of Birth: 09-03-1930   Medicare Important Message Given:  Yes - Important Message mailed due to current National Emergency  Verbal consent obtained due to current National Emergency  Relationship to patient: Self Contact Name: Mannie Wineland Call Date: 02/01/20  Time: 1446 Phone: [phone] Outcome: No Answer/Busy Important Message mailed to: Patient address on file    Delorse Lek 02/01/2020, 2:46 PM

## 2020-02-01 NOTE — Discharge Summary (Signed)
Physician Discharge Summary  [name] CXK:[id number] DOB: 1930-07-24 DOA: 01/26/2020  PCP: No primary care provider on file.  Admit date: 01/26/2020   Discharge date: 02/01/2020  Admitted From: SNF   Disposition:  [name]  Recommendations for Outpatient Follow-up:  1. Follow up with PCP in 1-2 weeks. 2. Please obtain BMP/CBC in one week. 3. Patient has been discharged back to [facility] facility for rehab. 4. Patient has been discharged on Valtrex for 3 more days to complete 7-day treatment for shingles. 5. Patient has completed 6 days antibiotics course for E. coli UTI.  Home Health: None.  Equipment/Devices: None.  Discharge Condition: Stable CODE STATUS:DNR Diet recommendation: Soft Diet   Brief Summary /Hospital course: This 86 year old female with history of hypertension, paroxysmal A. fib on Eliquis, breast cancer, dementia presented from [facility] after mechanical fall. Patient had no recollection of the events that led to her falling on the morning of admission in the bathroom. She sustained a laceration to the right side of the scalp. Patient complained of left leg pain when moving it.  CT of the left hip showed nonunited nondisplaced fracture of the anterior superior acetabulum extending to the anterior column, Periprosthetic loosening. Orthopedics was consulted, seen by Dr. Mardelle Downs, recommended nonsurgical management. Patient son, HPOA, requested palliative care consultation.  Patient was found to have a UTI, growing E. Coli.  Patient has completed 6 days course of IV antibiotics.  She is also found to have shingles on her back.  She is started on Valtrex for 7 days.  Patient is cleared to be transferred back to skilled nursing facility per orthopedics.  Patient is discharged on 3 more days of Valtrex to complete 7-day treatment.  She was managed for below problems.  Discharge Diagnoses:  Principal Problem:   Acetabular fracture  ([place]) Active Problems:   Leukocytosis   Lactic acidosis   History of breast cancer   Fall   Early onset Alzheimer's dementia ([place])   DNR (do not resuscitate)   Persistent atrial fibrillation (HCC)   Chronic anticoagulation  Left periprostatic acetabular fracture (HCC) - secondary to mechanical fall at the facility. -Per orthopedics, Dr. Mardelle Downs, nonsurgical management is the best course of action given comorbidities -Continue pain control, bowel regimen,palliativeto follow at the facility. - Son doesn't want any aggressive intervention.  Severe sepsis due to E. coli UTI:  -Patient presented with lactic acidosis, leukocytosis, tachycardia -Urine culture showed more than 100,000 colonies of E. Coli, -Continue IV Rocephin, blood cultures negative.  ( day 6) -She completed 6 days course of IV antibiotics.  Shingles: 2 vesicular lesions on the right buttock, complaining pruritus but no pain. - will monitor the lesions closely,started on Valtrex for 7 days.  Rash on the upper back, upper thighs and bilateral groin area -Wound care consulted, appreciate recommendations. -Nystatin ointment ordered  History of breast CA -BRCA positive, continue tamoxifen.  Hypothyroidism Continue Synthroid.  Dementia -Appreciate palliative medicine recommendations   Discharge Instructions  Discharge Instructions    Call MD for:  difficulty breathing, headache or visual disturbances   Complete by: As directed    Call MD for:  persistant dizziness or light-headedness   Complete by: As directed    Call MD for:  persistant nausea and vomiting   Complete by: As directed    Diet - low sodium heart healthy   Complete by: As directed    Diet Carb Modified   Complete by: As directed    Discharge instructions  Complete by: As directed    Advised to follow-up with primary care physician in 1 week. Patient has been discharged back to [facility] a skilled nursing facility for  rehab. Patient has been discharged on Valtrex for 3 more days to complete 7-day treatment for shingles. Patient has completed 6 days antibiotic for E. coli UTI   Discharge wound care:   Complete by: As directed    Follow up Wound care at SNF.   Increase activity slowly   Complete by: As directed      Allergies as of 02/01/2020      Reactions   Sulfa Antibiotics Other (See Comments)   Per MAR      Medication List    TAKE these medications   acetaminophen 500 MG tablet Commonly known as: TYLENOL Take 500 mg by mouth every 4 (four) hours.   amiodarone 100 MG tablet Commonly known as: PACERONE Take 100 mg by mouth daily.   baclofen 10 MG tablet Commonly known as: LIORESAL Take 10 mg by mouth 3 (three) times daily.   benazepril 5 MG tablet Commonly known as: LOTENSIN Take 5 mg by mouth daily.   DERMACLOUD EX Apply 1 application topically in the morning and at bedtime. buttocks   Eliquis 5 MG Tabs tablet Generic drug: apixaban Take 2.5 mg by mouth 2 (two) times daily.   furosemide 40 MG tablet Commonly known as: LASIX Take 40 mg by mouth daily.   gabapentin 100 MG capsule Commonly known as: NEURONTIN Take 200 mg by mouth in the morning and at bedtime.   levothyroxine 50 MCG tablet Commonly known as: SYNTHROID Take 50 mcg by mouth daily before breakfast.   loperamide 2 MG tablet Commonly known as: IMODIUM A-D Take 2 mg by mouth 4 (four) times daily as needed for diarrhea or loose stools.   nystatin cream Commonly known as: MYCOSTATIN Apply 1 application topically in the morning, at noon, and at bedtime. Back on under breasts   potassium chloride SA 20 MEQ tablet Commonly known as: KLOR-CON Take 20 mEq by mouth daily.   RISAMINE EX Apply 1 application topically daily. (no location provided)   tamoxifen 10 MG tablet Commonly known as: NOLVADEX Take 10 mg by mouth daily.   Trimo-San 0.025-0.01 % Gel Generic drug: Oxyquinoline-Sod Lauryl Sulf Place 1  application vaginally every Friday.   valACYclovir 1000 MG tablet Commonly known as: VALTREX Take 1 tablet (1,000 mg total) by mouth 2 (two) times daily.            Discharge Care Instructions  (From admission, onward)         Start     Ordered   02/01/20 0000  Discharge wound care:       Comments: Follow up Wound care at [facility].   02/01/20 1214          Follow-up Information    [facility] Follow up.   Specialty: PALLIATIVE CARE Why: [facility] is following patient for palliative care when she returns to [facility]. Contact information: [place] [address] [phone]             Allergies  Allergen Reactions  . Sulfa Antibiotics Other (See Comments)    Per MAR    Consultations:   Orthopaedics.Palliatice care   Procedures/Studies: CT HEAD WO CONTRAST  Result Date: 01/26/2020 CLINICAL DATA:  Fall on blood thinner EXAM: CT HEAD WITHOUT CONTRAST TECHNIQUE: Contiguous axial images were obtained from the base of the skull through  the vertex without intravenous contrast. COMPARISON:  None. FINDINGS: Brain: No evidence of acute territorial infarction, hemorrhage, hydrocephalus,extra-axial collection or mass lesion/mass effect. There is dilatation the ventricles and sulci consistent with age-related atrophy. Low-attenuation changes in the deep white matter consistent with small vessel ischemia. Vascular: No hyperdense vessel or unexpected calcification. Skull: The skull is intact. No fracture or focal lesion identified. Sinuses/Orbits: The visualized paranasal sinuses and mastoid air cells are clear. The orbits and globes intact. Other: None Cervical spine: Alignment: Physiologic Skull base and vertebrae: Visualized skull base is intact. No atlanto-occipital dissociation. The vertebral body heights are well maintained. No fracture or pathologic osseous lesion seen. Soft tissues and spinal canal: The visualized  paraspinal soft tissues are unremarkable. No prevertebral soft tissue swelling is seen. The spinal canal is grossly unremarkable, no large epidural collection or significant canal narrowing. Disc levels: Multilevel cervical spine spondylosis is seen with anterior osteophytes disc osteophyte complex and uncovertebral osteophytes most notable at C5-C6 with moderate neural foraminal narrowing and mild central canal stenosis. Upper chest: The lung apices are clear. Thoracic inlet is within normal limits. Other: None IMPRESSION: No acute intracranial abnormality. Findings consistent with age related atrophy and chronic small vessel ischemia No acute fracture or malalignment of the spine. Electronically Signed   By: Prudencio Pair M.D.   On: 01/26/2020 15:54   CT CERVICAL SPINE WO CONTRAST  Result Date: 01/26/2020 CLINICAL DATA:  Fall on blood thinner EXAM: CT HEAD WITHOUT CONTRAST TECHNIQUE: Contiguous axial images were obtained from the base of the skull through the vertex without intravenous contrast. COMPARISON:  None. FINDINGS: Brain: No evidence of acute territorial infarction, hemorrhage, hydrocephalus,extra-axial collection or mass lesion/mass effect. There is dilatation the ventricles and sulci consistent with age-related atrophy. Low-attenuation changes in the deep white matter consistent with small vessel ischemia. Vascular: No hyperdense vessel or unexpected calcification. Skull: The skull is intact. No fracture or focal lesion identified. Sinuses/Orbits: The visualized paranasal sinuses and mastoid air cells are clear. The orbits and globes intact. Other: None Cervical spine: Alignment: Physiologic Skull base and vertebrae: Visualized skull base is intact. No atlanto-occipital dissociation. The vertebral body heights are well maintained. No fracture or pathologic osseous lesion seen. Soft tissues and spinal canal: The visualized paraspinal soft tissues are unremarkable. No prevertebral soft tissue swelling  is seen. The spinal canal is grossly unremarkable, no large epidural collection or significant canal narrowing. Disc levels: Multilevel cervical spine spondylosis is seen with anterior osteophytes disc osteophyte complex and uncovertebral osteophytes most notable at C5-C6 with moderate neural foraminal narrowing and mild central canal stenosis. Upper chest: The lung apices are clear. Thoracic inlet is within normal limits. Other: None IMPRESSION: No acute intracranial abnormality. Findings consistent with age related atrophy and chronic small vessel ischemia No acute fracture or malalignment of the spine. Electronically Signed   By: Prudencio Pair M.D.   On: 01/26/2020 15:54   DG Pelvis Portable  Result Date: 01/26/2020 CLINICAL DATA:  Fall. EXAM: PORTABLE PELVIS 1-2 VIEWS COMPARISON:  November 08, 2009.  December 15, 2018. FINDINGS: Status post left hip arthroplasty. There is interval development of mildly displaced periprosthetic left acetabular fracture. Right hip is unremarkable. Degenerative changes seen involving both sacroiliac joints. IMPRESSION: Interval development of mildly displaced periprosthetic left acetabular fracture. Electronically Signed   By: Marijo Conception M.D.   On: 01/26/2020 14:50   CT Hip Left Wo Contrast  Result Date: 01/26/2020 CLINICAL DATA:  Pain after fall EXAM: CT OF THE LEFT HIP WITHOUT  CONTRAST TECHNIQUE: Multidetector CT imaging of the left hip was performed according to the standard protocol. Multiplanar CT image reconstructions were also generated. COMPARISON:  None. FINDINGS: Bones/Joint/Cartilage The patient is status post left total hip arthroplasty. There is a nonunited fracture seen at the anterior superior acetabulum extending through the anterior column. There is a tiny fracture fragment seen along the lateral acetabular rim. The femoral head component is still well seated within the acetabular component. There appears to be a area periprosthetic lucency seen around the the  inferior acetabular cup measuring up to 1 cm, slightly more progressed since the prior exam. Ligaments Suboptimally assessed by CT. Muscles and Tendons The muscles surrounding the hip appear to be intact. There is mild fatty atrophy of the muscles. There is limited visualization of the tendons, the hamstrings and iliopsoas tendons appear to be intact. Soft tissues Mild subcutaneous edema seen along the lateral aspect of the hip. The bladder is fluid-filled and distended. Scattered vascular calcifications are noted. IMPRESSION: 1. Left total hip arthroplasty with a nonunited nondisplaced fracture of the anterior superior acetabulum extending through the anterior column. 2. Slight interval increase in the periprosthetic lucency in the inferior portion of the acetabular cup, likely consistent with periprosthetic loosening. Electronically Signed   By: Prudencio Pair M.D.   On: 01/26/2020 18:16   DG Chest Port 1 View  Result Date: 01/26/2020 CLINICAL DATA:  Fall. EXAM: PORTABLE CHEST 1 VIEW COMPARISON:  December 04, 2019.  Sep 18, 2019. FINDINGS: The heart size and mediastinal contours are within normal limits. Both lungs are clear. No pneumothorax or pleural effusion is noted. Stable chronic anterior dislocation involving proximal right humerus is noted. IMPRESSION: No active disease. Electronically Signed   By: Marijo Conception M.D.   On: 01/26/2020 14:47   DG Shoulder Right Port  Result Date: 01/26/2020 CLINICAL DATA:  Fall. EXAM: PORTABLE RIGHT SHOULDER COMPARISON:  Sep 18, 2019. FINDINGS: There is stable anterior dislocation or subluxation of the right proximal humeral head relative to the glenoid labrum consistent with chronic dislocation. Deformity of right humeral head is noted consistent with chronic degenerative change. No acute fracture is noted. IMPRESSION: Stable chronic anterior dislocation or subluxation of right proximal humeral head relative to the glenoid labrum. Chronic degenerative change of right  humeral head is noted. Electronically Signed   By: Marijo Conception M.D.   On: 01/26/2020 14:51   DG Knee Left Port  Result Date: 01/26/2020 CLINICAL DATA:  Status post fall. EXAM: PORTABLE LEFT KNEE - 1-2 VIEW COMPARISON:  None. FINDINGS: Status post left total knee arthroplasty. The femoral and tibial components appear to be well situated. No fracture or dislocation is noted. No soft tissue abnormality is noted. IMPRESSION: Status post left total knee arthroplasty. No acute abnormality seen in the left knee. Electronically Signed   By: Marijo Conception M.D.   On: 01/26/2020 14:45   DG Knee Right Port  Result Date: 01/26/2020 CLINICAL DATA:  Fall. EXAM: PORTABLE RIGHT KNEE - 1-2 VIEW COMPARISON:  None. FINDINGS: Status post right total knee arthroplasty. The femoral and tibial components appear to be well situated. No fracture or dislocation is noted. IMPRESSION: Status post right total knee arthroplasty. No acute abnormality seen in the right knee. Electronically Signed   By: Marijo Conception M.D.   On: 01/26/2020 14:48       Subjective: Patient was seen and examined at bedside.  Overnight events noted.   She is alert, oriented x1.  She  reports having slight pain in the left leg.  Discharge Exam: Vitals:   02/01/20 0300 02/01/20 0700  BP: 120/74 137/77  Pulse: 76 67  Resp: 17 17  Temp: 98.8 F (37.1 C) 99.1 F (37.3 C)  SpO2: 95% 96%   Vitals:   01/31/20 1559 01/31/20 1900 02/01/20 0300 02/01/20 0700  BP: 122/76 125/77 120/74 137/77  Pulse:  77 76 67  Resp: 16 17 17 17   Temp: 99.2 F (37.3 C) 98.9 F (37.2 C) 98.8 F (37.1 C) 99.1 F (37.3 C)  TempSrc: Oral Oral Oral Oral  SpO2: 91% 97% 95% 96%  Weight:      Height:        General: Pt is alert, awake, not in acute distress Cardiovascular: RRR, S1/S2 +, no rubs, no gallops Respiratory: CTA bilaterally, no wheezing, no rhonchi Abdominal: Soft, NT, ND, bowel sounds + Extremities: tenderness in LLE.    The results of  significant diagnostics from this hospitalization (including imaging, microbiology, ancillary and laboratory) are listed below for reference.     Microbiology: Recent Results (from the past 240 hour(s))  SARS Coronavirus 2 by RT PCR (hospital order, performed in [facility] hospital lab) Nasopharyngeal Nasopharyngeal Swab     Status: None   Collection Time: 01/26/20  4:06 PM   Specimen: Nasopharyngeal Swab  Result Value Ref Range Status   SARS Coronavirus 2 NEGATIVE NEGATIVE Final    Comment: (NOTE) SARS-CoV-2 target nucleic acids are NOT DETECTED.  The SARS-CoV-2 RNA is generally detectable in upper and lower respiratory specimens during the acute phase of infection. The lowest concentration of SARS-CoV-2 viral copies this assay can detect is 250 copies / mL. A negative result does not preclude SARS-CoV-2 infection and should not be used as the sole basis for treatment or other patient management decisions.  A negative result may occur with improper specimen collection / handling, submission of specimen other than nasopharyngeal swab, presence of viral mutation(s) within the areas targeted by this assay, and inadequate number of viral copies (<250 copies / mL). A negative result must be combined with clinical observations, patient history, and epidemiological information.  Fact Sheet for Patients:   StrictlyIdeas.no  Fact Sheet for Healthcare Providers: BankingDealers.co.za  This test is not yet approved or  cleared by the [place] FDA and has been authorized for detection and/or diagnosis of SARS-CoV-2 by FDA under an Emergency Use Authorization (EUA).  This EUA will remain in effect (meaning this test can be used) for the duration of the COVID-19 declaration under Section 564(b)(1) of the Act, 21 U.S.C. section 360bbb-3(b)(1), unless the authorization is terminated or revoked sooner.  Performed at [facility],  [place] [address] [place], [place] [address]   Culture, blood (routine x 2)     Status: None   Collection Time: 01/26/20  6:16 PM   Specimen: BLOOD  Result Value Ref Range Status   Specimen Description BLOOD LEFT ANTECUBITAL  Final   Special Requests   Final    BOTTLES DRAWN AEROBIC AND ANAEROBIC Blood Culture results may not be optimal due to an inadequate volume of blood received in culture bottles   Culture   Final    NO GROWTH 5 DAYS Performed at [facility], [place] [address], [place], [place] [address]    Report Status 01/31/2020 FINAL  Final  Culture, Urine     Status: Abnormal   Collection Time: 01/26/20  6:20 PM   Specimen: Urine, Clean Catch  Result  Value Ref Range Status   Specimen Description URINE, CLEAN CATCH  Final   Special Requests   Final    NONE Performed at [facility], [place] [address] [place], [place] [address]    Culture >=100,000 COLONIES/mL ESCHERICHIA COLI (A)  Final   Report Status 01/28/2020 FINAL  Final   Organism ID, Bacteria ESCHERICHIA COLI (A)  Final      Susceptibility   Escherichia coli - MIC*    AMPICILLIN >=32 RESISTANT Resistant     CEFAZOLIN <=4 SENSITIVE Sensitive     CEFTRIAXONE <=0.25 SENSITIVE Sensitive     CIPROFLOXACIN <=0.25 SENSITIVE Sensitive     GENTAMICIN <=1 SENSITIVE Sensitive     IMIPENEM <=0.25 SENSITIVE Sensitive     NITROFURANTOIN <=16 SENSITIVE Sensitive     TRIMETH/SULFA <=20 SENSITIVE Sensitive     AMPICILLIN/SULBACTAM 16 INTERMEDIATE Intermediate     PIP/TAZO <=4 SENSITIVE Sensitive     * >=100,000 COLONIES/mL ESCHERICHIA COLI  Culture, blood (routine x 2)     Status: None (Preliminary result)   Collection Time: 01/27/20  1:14 AM   Specimen: BLOOD RIGHT HAND  Result Value Ref Range Status   Specimen Description BLOOD RIGHT HAND  Final   Special Requests   Final    BOTTLES DRAWN AEROBIC AND ANAEROBIC Blood Culture adequate volume   Culture   Final    NO GROWTH 4 DAYS Performed at [facility],  [address] [place], [place] [address]    Report Status PENDING  Incomplete     Labs: BNP (last 3 results) No results for input(s): BNP in the last 8760 hours. Basic Metabolic Panel: Recent Labs  Lab 01/26/20 1404 01/27/20 0114 01/30/20 0146 02/01/20 1049  NA 140 141 137 137  K 4.2 3.7 4.4 4.3  CL 103 106 105 104  CO2 25 24 23 22   GLUCOSE 110* 108* 115* 122*  BUN 20 19 14 11   CREATININE 0.59 0.58 0.46 0.46  CALCIUM 9.4 8.9 8.5* 8.5*  MG  --   --   --  1.8  PHOS  --   --   --  4.3   Liver Function Tests: Recent Labs  Lab 01/26/20 1404  AST 13*  ALT 11  ALKPHOS 49  BILITOT 0.5  PROT 6.8  ALBUMIN 3.2*   No results for input(s): LIPASE, AMYLASE in the last 168 hours. No results for input(s): AMMONIA in the last 168 hours. CBC: Recent Labs  Lab 01/26/20 1404 01/27/20 0114 01/30/20 0146 02/01/20 1049  WBC 11.1* 10.7* 7.5 10.2  HGB 13.6 12.7 10.4* 10.6*  HCT 44.6 41.6 34.2* 34.1*  MCV 92.5 91.6 91.7 89.5  PLT 273 264 227 223   Cardiac Enzymes: No results for input(s): CKTOTAL, CKMB, CKMBINDEX, TROPONINI in the last 168 hours. BNP: Invalid input(s): POCBNP CBG: No results for input(s): GLUCAP in the last 168 hours. D-Dimer No results for input(s): DDIMER in the last 72 hours. Hgb A1c No results for input(s): HGBA1C in the last 72 hours. Lipid Profile No results for input(s): CHOL, HDL, LDLCALC, TRIG, CHOLHDL, LDLDIRECT in the last 72 hours. Thyroid function studies No results for input(s): TSH, T4TOTAL, T3FREE, THYROIDAB in the last 72 hours.  Invalid input(s): FREET3 Anemia work up No results for input(s): VITAMINB12, FOLATE, FERRITIN, TIBC, IRON, RETICCTPCT in the last 72 hours. Urinalysis    Component Value Date/Time   COLORURINE YELLOW 01/26/2020 1606   APPEARANCEUR CLOUDY (A) 01/26/2020 1606   LABSPEC 1.011 01/26/2020 1606  PHURINE 5.0 01/26/2020 1606   GLUCOSEU NEGATIVE 01/26/2020 1606   HGBUR SMALL (A) 01/26/2020 1606   BILIRUBINUR  NEGATIVE 01/26/2020 1606   KETONESUR NEGATIVE 01/26/2020 1606   PROTEINUR NEGATIVE 01/26/2020 1606   NITRITE NEGATIVE 01/26/2020 1606   LEUKOCYTESUR LARGE (A) 01/26/2020 1606   Sepsis Labs Invalid input(s): PROCALCITONIN,  WBC,  LACTICIDVEN Microbiology Recent Results (from the past 240 hour(s))  SARS Coronavirus 2 by RT PCR (hospital order, performed in [facility] hospital lab) Nasopharyngeal Nasopharyngeal Swab     Status: None   Collection Time: 01/26/20  4:06 PM   Specimen: Nasopharyngeal Swab  Result Value Ref Range Status   SARS Coronavirus 2 NEGATIVE NEGATIVE Final    Comment: (NOTE) SARS-CoV-2 target nucleic acids are NOT DETECTED.  The SARS-CoV-2 RNA is generally detectable in upper and lower respiratory specimens during the acute phase of infection. The lowest concentration of SARS-CoV-2 viral copies this assay can detect is 250 copies / mL. A negative result does not preclude SARS-CoV-2 infection and should not be used as the sole basis for treatment or other patient management decisions.  A negative result may occur with improper specimen collection / handling, submission of specimen other than nasopharyngeal swab, presence of viral mutation(s) within the areas targeted by this assay, and inadequate number of viral copies (<250 copies / mL). A negative result must be combined with clinical observations, patient history, and epidemiological information.  Fact Sheet for Patients:   StrictlyIdeas.no  Fact Sheet for Healthcare Providers: BankingDealers.co.za  This test is not yet approved or  cleared by the [place] FDA and has been authorized for detection and/or diagnosis of SARS-CoV-2 by FDA under an Emergency Use Authorization (EUA).  This EUA will remain in effect (meaning this test can be used) for the duration of the COVID-19 declaration under Section 564(b)(1) of the Act, 21 U.S.C. section 360bbb-3(b)(1),  unless the authorization is terminated or revoked sooner.  Performed at [facility], [place] [address], [place], [place] [address]   Culture, blood (routine x 2)     Status: None   Collection Time: 01/26/20  6:16 PM   Specimen: BLOOD  Result Value Ref Range Status   Specimen Description BLOOD LEFT ANTECUBITAL  Final   Special Requests   Final    BOTTLES DRAWN AEROBIC AND ANAEROBIC Blood Culture results may not be optimal due to an inadequate volume of blood received in culture bottles   Culture   Final    NO GROWTH 5 DAYS Performed at [facility], [place] [address], [place], [place] [address]    Report Status 01/31/2020 FINAL  Final  Culture, Urine     Status: Abnormal   Collection Time: 01/26/20  6:20 PM   Specimen: Urine, Clean Catch  Result Value Ref Range Status   Specimen Description URINE, CLEAN CATCH  Final   Special Requests   Final    NONE Performed at [facility], [place] [address] [place], [place] [address]    Culture >=100,000 COLONIES/mL ESCHERICHIA COLI (A)  Final   Report Status 01/28/2020 FINAL  Final   Organism ID, Bacteria ESCHERICHIA COLI (A)  Final      Susceptibility   Escherichia coli - MIC*    AMPICILLIN >=32 RESISTANT Resistant     CEFAZOLIN <=4 SENSITIVE Sensitive     CEFTRIAXONE <=0.25 SENSITIVE Sensitive     CIPROFLOXACIN <=0.25 SENSITIVE Sensitive     GENTAMICIN <=1 SENSITIVE Sensitive     IMIPENEM <=  0.25 SENSITIVE Sensitive     NITROFURANTOIN <=16 SENSITIVE Sensitive     TRIMETH/SULFA <=20 SENSITIVE Sensitive     AMPICILLIN/SULBACTAM 16 INTERMEDIATE Intermediate     PIP/TAZO <=4 SENSITIVE Sensitive     * >=100,000 COLONIES/mL ESCHERICHIA COLI  Culture, blood (routine x 2)     Status: None (Preliminary result)   Collection Time: 01/27/20  1:14 AM   Specimen: BLOOD RIGHT HAND  Result Value Ref Range Status   Specimen Description BLOOD RIGHT HAND  Final   Special Requests   Final    BOTTLES DRAWN AEROBIC AND ANAEROBIC Blood  Culture adequate volume   Culture   Final    NO GROWTH 4 DAYS Performed at [facility], [place] [address], [place], [place] [address]    Report Status PENDING  Incomplete     Time coordinating discharge: Over 30 minutes  SIGNED:   Shawna Clamp, MD  [facility] 02/01/2020, 12:17 PM Pager   If 7PM-7AM, please contact night-coverage [email or web address]

## 2020-02-01 NOTE — Plan of Care (Signed)

## 2020-02-01 NOTE — Progress Notes (Addendum)
Attempted to give report to facility, no answer, left my callback number.  Called facility again, talked to operator, left callback number.  Received call from [name], report given, all questions and concerns addressed, Pt not in distress, to discharge to SNF via PTAR with belongings.   Son called, left message to voicemail.

## 2020-02-01 NOTE — NC FL2 (Signed)
[place] LEVEL OF CARE SCREENING TOOL     IDENTIFICATION  Patient Name: [name] Birthdate: [birth year] Sex: female Admission Date (Current Location): 01/26/2020  [place] and [place] Number:  Kathleen Argue [id number] [place] and Address:  [facility] [place], [place] [address] [place], [place] [address]      Provider Number: [id number]  Attending Physician Name and Address:  Shawna Clamp, MD  Relative Name and Phone Number:  Brianni, Manthe   [phone]    Current Level of Care: Hospital Recommended Level of Care: [place] Prior Approval Number:    Date Approved/Denied:   PASRR Number: [id number]  Discharge Plan: SNF    Current Diagnoses: Patient Active Problem List   Diagnosis Date Noted  . Failure to thrive in adult   . Encounter for hospice care discussion   . Acetabular fracture ([place]) 01/26/2020  . Leukocytosis 01/26/2020  . Lactic acidosis 01/26/2020  . History of breast cancer 01/26/2020  . Fall 01/26/2020  . Early onset Alzheimer's dementia ([place]) 01/26/2020  . DNR (do not resuscitate) 01/26/2020  . Persistent atrial fibrillation ([place]) 01/26/2020  . Chronic anticoagulation 01/26/2020  . Goals of care, counseling/discussion   . Palliative care by specialist     Orientation RESPIRATION BLADDER Height & Weight     Self  Normal Incontinent Weight: 175 lb (79.4 kg) Height:  5\' 2"  (157.5 cm)  BEHAVIORAL SYMPTOMS/MOOD NEUROLOGICAL BOWEL NUTRITION STATUS      Incontinent Diet (carb modified, see discharge summary)  AMBULATORY STATUS COMMUNICATION OF NEEDS Skin   Extensive Assist Verbally Other (Comment) (rash)                       Personal Care Assistance Level of Assistance  Bathing, Feeding, Dressing Bathing Assistance: Maximum assistance Feeding assistance: Independent Dressing Assistance: Maximum assistance     Functional Limitations Info  Sight, Hearing, Speech Sight Info: Adequate Hearing  Info: Adequate Speech Info: Adequate    SPECIAL CARE FACTORS FREQUENCY  PT (By licensed PT), OT (By licensed OT)     PT Frequency: 5x week OT Frequency: 5x week            Contractures Contractures Info: Not present    Additional Factors Info  Code Status, Allergies Code Status Info: DNR Allergies Info: sulfa antibiotics           Current Medications (02/01/2020):  This is the current hospital active medication list Current Facility-Administered Medications  Medication Dose Route Frequency Provider Last Rate Last Admin  . acetaminophen (TYLENOL) tablet 500 mg  500 mg Oral Q4H PRN Rai, Ripudeep K, MD      . amiodarone (PACERONE) tablet 100 mg  100 mg Oral Daily Rai, Ripudeep K, MD   100 mg at 02/01/20 0849  . apixaban (ELIQUIS) tablet 2.5 mg  2.5 mg Oral BID Fuller Plan A, MD   2.5 mg at 02/01/20 0849  . baclofen (LIORESAL) tablet 10 mg  10 mg Oral TID Rai, Ripudeep K, MD   10 mg at 02/01/20 0849  . benazepril (LOTENSIN) tablet 5 mg  5 mg Oral Daily Rai, Ripudeep K, MD   5 mg at 02/01/20 0853  . ceFAZolin (ANCEF) IVPB 1 g/50 mL premix  1 g Intravenous Q8H Shawna Clamp, MD   Stopped at 02/01/20 [phone]  . gabapentin (NEURONTIN) capsule 200 mg  200 mg Oral BID Rai, Ripudeep K, MD   200 mg at 02/01/20 0849  . Gerhardt's butt cream  Topical QID Mendel Corning, MD   Given at 02/01/20 [phone]  . HYDROcodone-acetaminophen (NORCO/VICODIN) 5-325 MG per tablet 1-2 tablet  1-2 tablet Oral Q6H PRN Norval Morton, MD   1 tablet at 01/31/20 2055  . levothyroxine (SYNTHROID) tablet 50 mcg  50 mcg Oral QAC breakfast Rai, Ripudeep K, MD   50 mcg at 02/01/20 0552  . morphine 2 MG/ML injection 0.5 mg  0.5 mg Intravenous Q2H PRN Fuller Plan A, MD      . nystatin-triamcinolone (MYCOLOG II) cream 1 application  1 application Topical TID Rai, Vernelle Emerald, MD   1 application at 32/00/37 0855  . senna (SENOKOT) tablet 8.6 mg  1 tablet Oral Daily Smith, Rondell A, MD   8.6 mg at 02/01/20 0849  .  tamoxifen (NOLVADEX) tablet 10 mg  10 mg Oral Daily Rai, Ripudeep K, MD   10 mg at 02/01/20 0853  . valACYclovir (VALTREX) tablet 1,000 mg  1,000 mg Oral BID Rai, Ripudeep K, MD   1,000 mg at 02/01/20 9444     Discharge Medications: Please see discharge summary for a list of discharge medications.  Relevant Imaging Results:  Relevant Lab Results:   Additional Information SSN [id number]  Joanne Chars, LCSW

## 2020-02-01 NOTE — Plan of Care (Signed)

## 2020-02-01 NOTE — TOC Transition Note (Signed)
Transition of Care [facility]) - CM/SW Discharge Note   Patient Details  Name: [name] MRN: [id number] Date of Birth: [birth year]  [facility] of [facility] [place] CM/SW Contact:  Joanne Chars, LCSW Phone Number: 02/01/2020, 2:58 PM   Clinical Narrative:   Pt going to Rm 3250 at [facility].  RN call report to [phone].  PTAR called [phone].    Final next level of care: Skilled Nursing Facility Barriers to Discharge: Barriers Resolved   Patient Goals and CMS Choice Patient states their goals for this hospitalization and ongoing recovery are:: Family is requesting patient to be followed by Palliative care at [facility]      Discharge Placement              Patient chooses bed at: [facility] Patient to be transferred to facility by: [place] Name of family member notified: Ed, son Patient and family notified of of transfer: 02/01/20  Discharge Plan and Services     Post Acute Care Choice: [place]                               Social Determinants of Health (SDOH) Interventions     Readmission Risk Interventions No flowsheet data found.

## 2020-05-14 DEATH — deceased

## 2021-11-21 ENCOUNTER — Encounter: Payer: Self-pay | Admitting: Cardiology
